# Patient Record
Sex: Female | Born: 1935 | Race: White | Hispanic: No | Smoking: Never smoker
Health system: Southern US, Community
[De-identification: ages and names within clinical notes are randomized; demographics above are authoritative.]

## PROBLEM LIST (undated history)

## (undated) DIAGNOSIS — K219 Gastro-esophageal reflux disease without esophagitis: Secondary | ICD-10-CM

## (undated) DIAGNOSIS — E119 Type 2 diabetes mellitus without complications: Secondary | ICD-10-CM

## (undated) DIAGNOSIS — I2510 Atherosclerotic heart disease of native coronary artery without angina pectoris: Secondary | ICD-10-CM

## (undated) DIAGNOSIS — C50912 Malignant neoplasm of unspecified site of left female breast: Secondary | ICD-10-CM

## (undated) DIAGNOSIS — I480 Paroxysmal atrial fibrillation: Secondary | ICD-10-CM

## (undated) DIAGNOSIS — E039 Hypothyroidism, unspecified: Secondary | ICD-10-CM

## (undated) DIAGNOSIS — I10 Essential (primary) hypertension: Secondary | ICD-10-CM

## (undated) DIAGNOSIS — E785 Hyperlipidemia, unspecified: Secondary | ICD-10-CM

## (undated) HISTORY — PX: STENT PLACEMENT VASCULAR (ARMC HX): HXRAD1737

## (undated) HISTORY — PX: CHOLECYSTECTOMY: SHX55

## (undated) HISTORY — PX: MASTECTOMY: SHX3

## (undated) HISTORY — PX: BREAST LUMPECTOMY: SHX2

## (undated) HISTORY — PX: CARDIAC CATHETERIZATION: SHX172

---

## 2007-08-24 ENCOUNTER — Ambulatory Visit: Payer: Self-pay | Admitting: Endocrinology

## 2007-09-10 ENCOUNTER — Other Ambulatory Visit: Payer: Self-pay

## 2007-09-10 ENCOUNTER — Inpatient Hospital Stay: Payer: Self-pay | Admitting: Cardiovascular Disease

## 2007-09-11 ENCOUNTER — Other Ambulatory Visit: Payer: Self-pay

## 2007-09-13 ENCOUNTER — Ambulatory Visit: Payer: Self-pay | Admitting: Endocrinology

## 2007-09-17 ENCOUNTER — Inpatient Hospital Stay: Payer: Self-pay | Admitting: Internal Medicine

## 2007-09-17 ENCOUNTER — Other Ambulatory Visit: Payer: Self-pay

## 2008-02-14 ENCOUNTER — Emergency Department: Payer: Self-pay | Admitting: Emergency Medicine

## 2008-04-26 ENCOUNTER — Ambulatory Visit: Payer: Self-pay | Admitting: Internal Medicine

## 2008-05-16 ENCOUNTER — Inpatient Hospital Stay: Payer: Self-pay | Admitting: Internal Medicine

## 2008-05-16 ENCOUNTER — Other Ambulatory Visit: Payer: Self-pay

## 2008-05-18 ENCOUNTER — Other Ambulatory Visit: Payer: Self-pay

## 2008-07-12 ENCOUNTER — Ambulatory Visit: Payer: Self-pay

## 2008-09-29 ENCOUNTER — Emergency Department: Payer: Self-pay | Admitting: Emergency Medicine

## 2008-10-23 ENCOUNTER — Emergency Department: Payer: Self-pay | Admitting: Unknown Physician Specialty

## 2008-10-24 ENCOUNTER — Emergency Department: Payer: Self-pay | Admitting: Emergency Medicine

## 2009-03-30 ENCOUNTER — Inpatient Hospital Stay: Payer: Self-pay | Admitting: Internal Medicine

## 2009-07-12 ENCOUNTER — Ambulatory Visit: Payer: Self-pay | Admitting: Family Medicine

## 2009-08-12 ENCOUNTER — Ambulatory Visit: Payer: Self-pay | Admitting: Internal Medicine

## 2009-09-07 ENCOUNTER — Ambulatory Visit: Payer: Self-pay | Admitting: Internal Medicine

## 2009-09-12 ENCOUNTER — Ambulatory Visit: Payer: Self-pay | Admitting: Internal Medicine

## 2009-09-13 ENCOUNTER — Ambulatory Visit: Payer: Self-pay | Admitting: Internal Medicine

## 2009-10-10 ENCOUNTER — Ambulatory Visit: Payer: Self-pay | Admitting: Internal Medicine

## 2009-10-30 ENCOUNTER — Ambulatory Visit: Payer: Self-pay | Admitting: Surgery

## 2009-11-06 ENCOUNTER — Ambulatory Visit: Payer: Self-pay | Admitting: Surgery

## 2009-11-10 ENCOUNTER — Ambulatory Visit: Payer: Self-pay | Admitting: Internal Medicine

## 2009-11-23 ENCOUNTER — Ambulatory Visit: Payer: Self-pay | Admitting: Internal Medicine

## 2009-12-10 ENCOUNTER — Ambulatory Visit: Payer: Self-pay | Admitting: Internal Medicine

## 2009-12-10 ENCOUNTER — Observation Stay: Payer: Self-pay | Admitting: Internal Medicine

## 2010-05-21 IMAGING — NM NM LUNG SCAN
2 series · 16 of 16 positions shown · non-contrast
Comparison: none

REASON FOR EXAM: exertional SOB
COMMENTS:   LMP: Post-Menopausal

[Series 1000: lung perfusion · 1.95mm/px · 4 acquisitions, 8 frames shown]
[im 1/4]
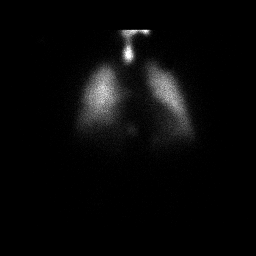
[im 1/4]
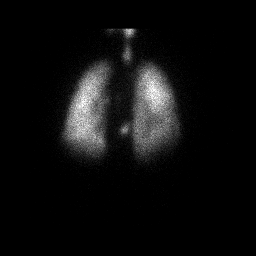
[im 2/4]
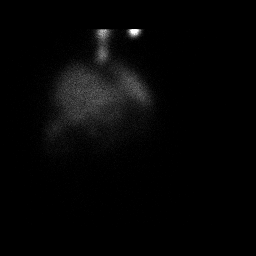
[im 2/4]
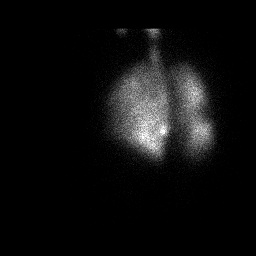
[im 3/4]
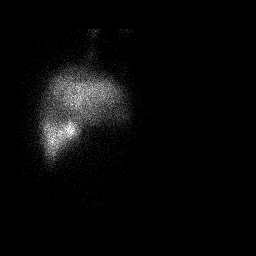
[im 3/4]
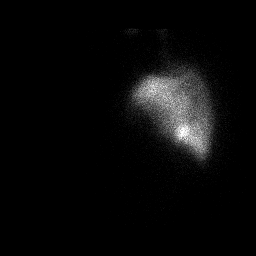
[im 4/4]
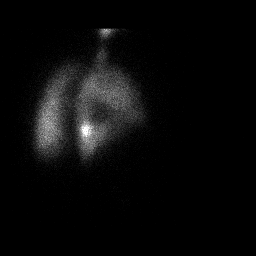
[im 4/4]
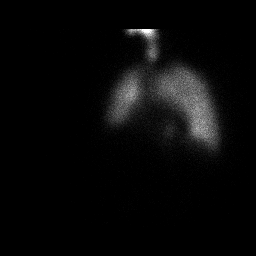

[Series 1000: lung ventilation · 3.90mm/px · 4 acquisitions, 8 frames shown]
[im 1/4]
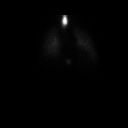
[im 1/4]
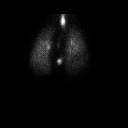
[im 2/4]
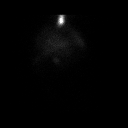
[im 2/4]
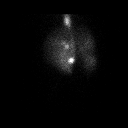
[im 3/4]
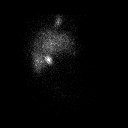
[im 3/4]
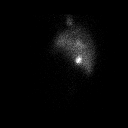
[im 4/4]
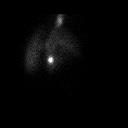
[im 4/4]
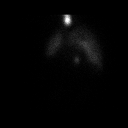

[16 of 16 positions shown; findings below may reference images not displayed]

PROCEDURE:     NM  - NM VQ LUNG SCAN  - [DATE]  [DATE] [DATE]  [DATE]

RESULT:     Ventilation/perfusion scan was performed with 28.20 mCi of
technetium 99m DTPA and 3.89 mCi 2c-ZZm MAA. Matched defect is noted along
the posterior aspect of the right lower lobe. Chest x-ray is negative. There
is an indeterminate scan for pulmonary embolus.
IMPRESSION: Indeterminate scan for pulmonary embolus.

This report was phoned to the physician at [DATE] a.m. on 12/11/2009.

## 2010-05-21 IMAGING — CR DG CHEST 2V
1 series · 2 of 2 positions shown · non-contrast
Comparison: none

REASON FOR EXAM: cough, shortness of breath, recent surgery 3 weeks ago
COMMENTS:   LMP: Post-Menopausal

PROCEDURE:     DXR - DXR CHEST PA (OR AP) AND LATERAL  - December 10, 2009 [DATE]
RESULT:     No acute cardiopulmonary disease. Cardiac pacer noted. Heart
size is normal. Chest is stable from 10/30/2009.

[Series 1: view not recorded · 0.17mm/px · 2 of 2 slices shown]
[im 1/2]
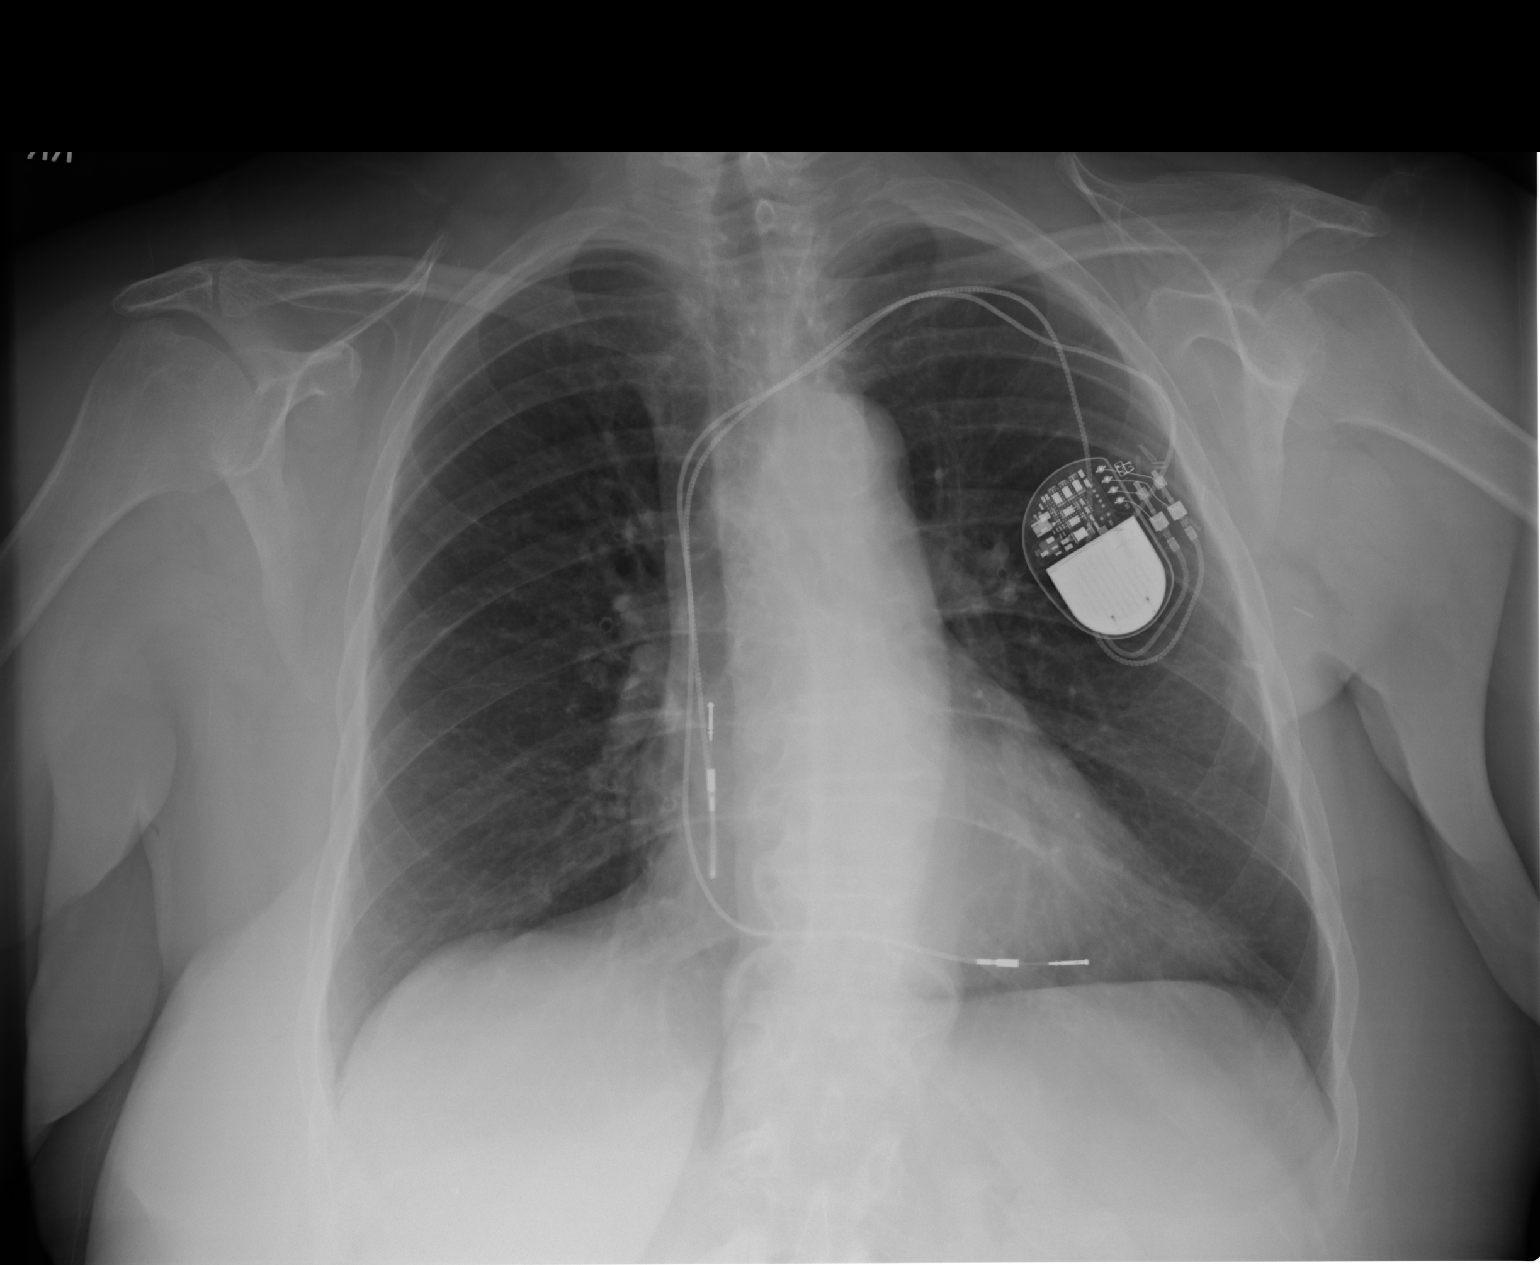
[im 2/2]
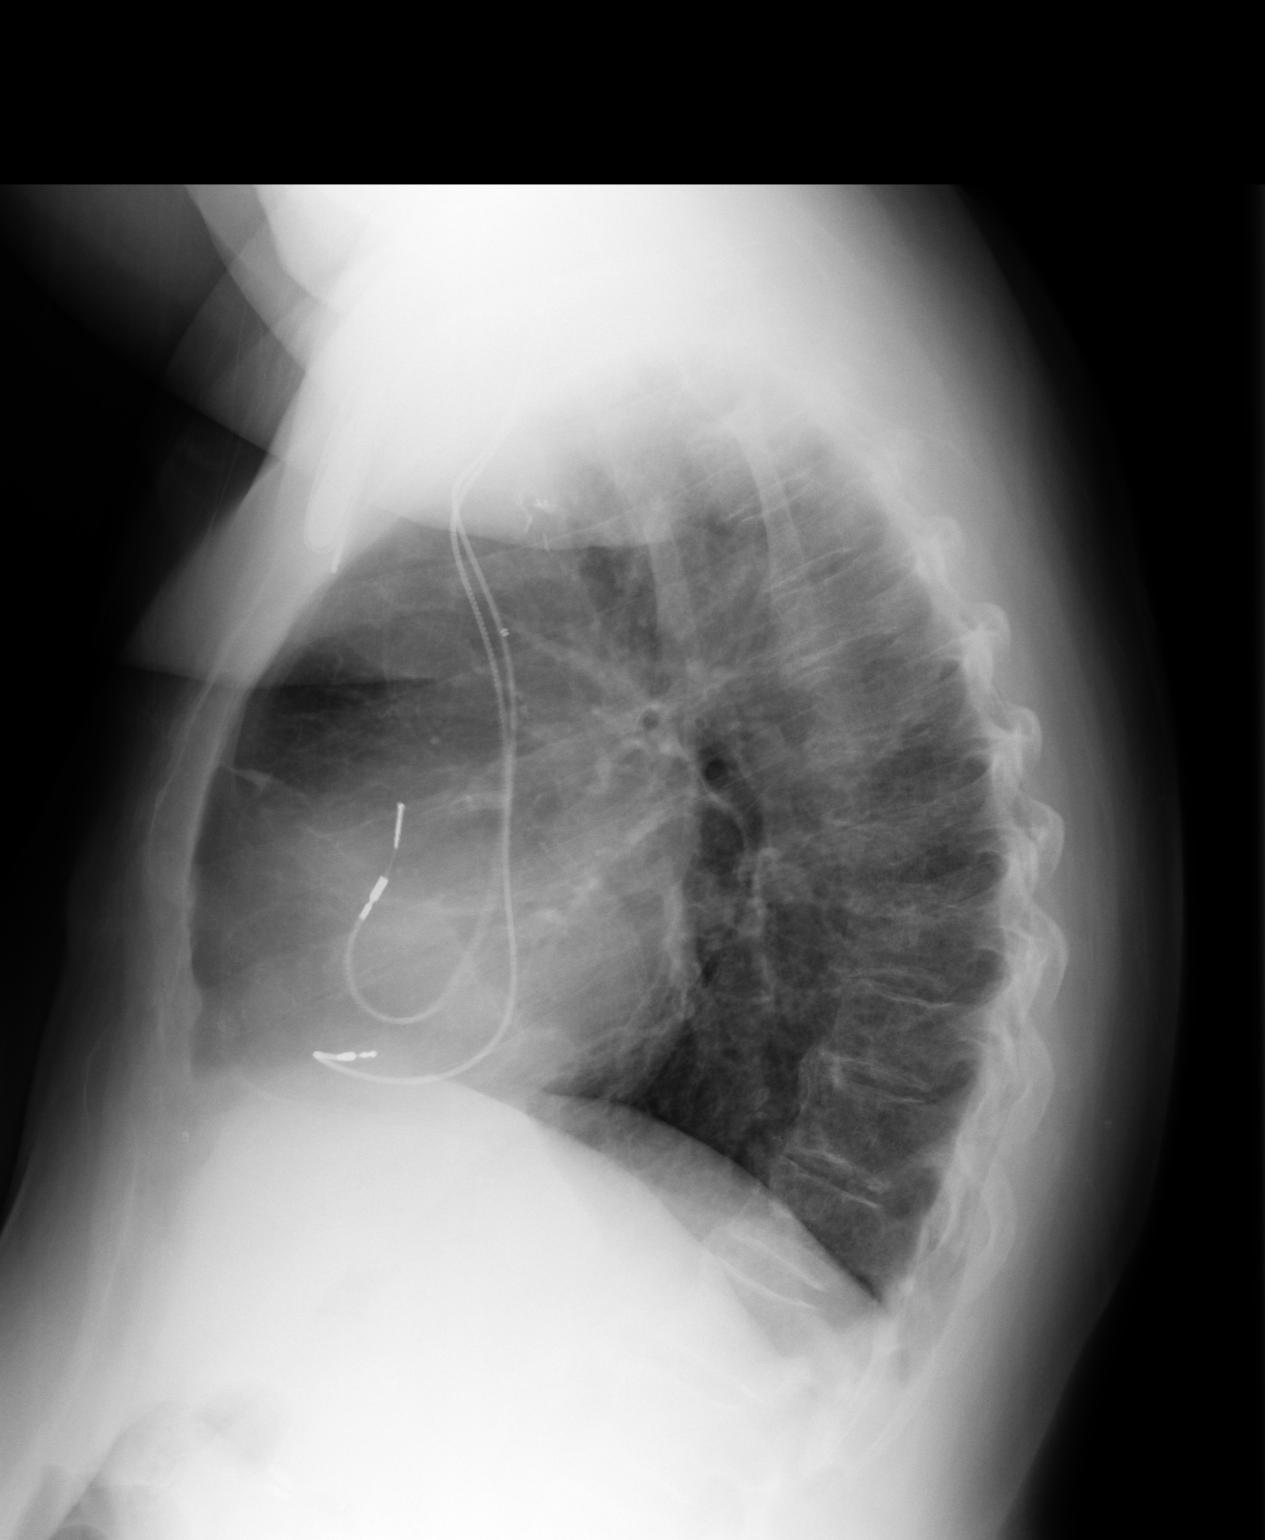

[2 of 2 positions shown; findings below may reference images not displayed]

IMPRESSION: No acute abnormality.

## 2011-02-01 ENCOUNTER — Emergency Department: Payer: Self-pay | Admitting: Emergency Medicine

## 2011-03-13 ENCOUNTER — Ambulatory Visit: Payer: Self-pay | Admitting: Cardiovascular Disease

## 2011-06-19 ENCOUNTER — Ambulatory Visit: Payer: Self-pay | Admitting: Gastroenterology

## 2011-06-23 LAB — PATHOLOGY REPORT

## 2011-08-13 DIAGNOSIS — C50912 Malignant neoplasm of unspecified site of left female breast: Secondary | ICD-10-CM

## 2011-08-13 HISTORY — DX: Malignant neoplasm of unspecified site of left female breast: C50.912

## 2011-08-21 ENCOUNTER — Ambulatory Visit: Payer: Self-pay | Admitting: Gastroenterology

## 2011-08-23 LAB — PATHOLOGY REPORT

## 2011-09-24 ENCOUNTER — Ambulatory Visit: Payer: Self-pay | Admitting: Family Medicine

## 2012-07-03 LAB — URINALYSIS, COMPLETE
Bilirubin,UR: NEGATIVE
Ketone: NEGATIVE
Ph: 5 (ref 4.5–8.0)
Protein: NEGATIVE
Specific Gravity: 1.008 (ref 1.003–1.030)
Squamous Epithelial: 7

## 2012-07-03 LAB — CBC
HCT: 32.1 % — ABNORMAL LOW (ref 35.0–47.0)
HGB: 10.7 g/dL — ABNORMAL LOW (ref 12.0–16.0)
MCH: 28.8 pg (ref 26.0–34.0)
MCV: 86 fL (ref 80–100)
Platelet: 229 10*3/uL (ref 150–440)
RBC: 3.72 10*6/uL — ABNORMAL LOW (ref 3.80–5.20)
RDW: 14.3 % (ref 11.5–14.5)
WBC: 6.8 10*3/uL (ref 3.6–11.0)

## 2012-07-03 LAB — BASIC METABOLIC PANEL
Anion Gap: 6 — ABNORMAL LOW (ref 7–16)
Calcium, Total: 8.8 mg/dL (ref 8.5–10.1)
Chloride: 107 mmol/L (ref 98–107)
Co2: 28 mmol/L (ref 21–32)
EGFR (Non-African Amer.): 43 — ABNORMAL LOW
Glucose: 167 mg/dL — ABNORMAL HIGH (ref 65–99)
Osmolality: 287 (ref 275–301)
Potassium: 3.7 mmol/L (ref 3.5–5.1)
Sodium: 141 mmol/L (ref 136–145)

## 2012-07-04 ENCOUNTER — Observation Stay: Payer: Self-pay | Admitting: Student

## 2012-07-05 LAB — BASIC METABOLIC PANEL
Anion Gap: 5 — ABNORMAL LOW (ref 7–16)
BUN: 18 mg/dL (ref 7–18)
Co2: 26 mmol/L (ref 21–32)
Creatinine: 1.17 mg/dL (ref 0.60–1.30)
EGFR (Non-African Amer.): 45 — ABNORMAL LOW
Glucose: 253 mg/dL — ABNORMAL HIGH (ref 65–99)
Potassium: 4.2 mmol/L (ref 3.5–5.1)
Sodium: 137 mmol/L (ref 136–145)

## 2012-07-05 LAB — LIPID PANEL
Cholesterol: 105 mg/dL (ref 0–200)
HDL Cholesterol: 56 mg/dL (ref 40–60)
Ldl Cholesterol, Calc: 10 mg/dL (ref 0–100)
VLDL Cholesterol, Calc: 39 mg/dL (ref 5–40)

## 2012-07-05 LAB — CBC WITH DIFFERENTIAL/PLATELET
Eosinophil #: 0.1 10*3/uL (ref 0.0–0.7)
Eosinophil %: 1.9 %
HCT: 30.1 % — ABNORMAL LOW (ref 35.0–47.0)
HGB: 10.1 g/dL — ABNORMAL LOW (ref 12.0–16.0)
Lymphocyte %: 19.6 %
MCH: 29.1 pg (ref 26.0–34.0)
MCHC: 33.6 g/dL (ref 32.0–36.0)
MCV: 87 fL (ref 80–100)
Monocyte #: 0.6 x10 3/mm (ref 0.2–0.9)
Monocyte %: 7.8 %
Neutrophil #: 5.3 10*3/uL (ref 1.4–6.5)
Platelet: 213 10*3/uL (ref 150–440)
RBC: 3.48 10*6/uL — ABNORMAL LOW (ref 3.80–5.20)
RDW: 13.9 % (ref 11.5–14.5)
WBC: 7.7 10*3/uL (ref 3.6–11.0)

## 2012-07-05 LAB — URINE CULTURE

## 2012-07-05 LAB — HEMOGLOBIN A1C: Hemoglobin A1C: 7.4 % — ABNORMAL HIGH (ref 4.2–6.3)

## 2012-09-09 ENCOUNTER — Emergency Department: Payer: Self-pay | Admitting: Emergency Medicine

## 2012-10-14 ENCOUNTER — Ambulatory Visit: Payer: Self-pay | Admitting: Family Medicine

## 2013-04-15 ENCOUNTER — Other Ambulatory Visit: Payer: Self-pay | Admitting: Urgent Care

## 2013-04-15 LAB — CLOSTRIDIUM DIFFICILE BY PCR

## 2013-05-04 ENCOUNTER — Ambulatory Visit: Payer: Self-pay | Admitting: Gastroenterology

## 2013-11-04 ENCOUNTER — Ambulatory Visit: Payer: Self-pay | Admitting: Family Medicine

## 2014-02-03 ENCOUNTER — Other Ambulatory Visit: Payer: Self-pay | Admitting: Urgent Care

## 2014-02-03 LAB — HEPATIC FUNCTION PANEL A (ARMC)
Albumin: 3.3 g/dL — ABNORMAL LOW (ref 3.4–5.0)
Alkaline Phosphatase: 80 U/L
BILIRUBIN TOTAL: 0.4 mg/dL (ref 0.2–1.0)
Bilirubin, Direct: <0.1 mg/dL (ref 0.00–0.20)
SGOT(AST): 25 U/L (ref 15–37)
SGPT (ALT): 21 U/L (ref 12–78)
Total Protein: 6.7 g/dL (ref 6.4–8.2)

## 2014-02-03 LAB — BASIC METABOLIC PANEL
Anion Gap: 8 (ref 7–16)
BUN: 18 mg/dL (ref 7–18)
CREATININE: 1.35 mg/dL — AB (ref 0.60–1.30)
Calcium, Total: 9.1 mg/dL (ref 8.5–10.1)
Chloride: 104 mmol/L (ref 98–107)
Co2: 28 mmol/L (ref 21–32)
EGFR (Non-African Amer.): 38 — ABNORMAL LOW
GFR CALC AF AMER: 43 — AB
Glucose: 84 mg/dL (ref 65–99)
Osmolality: 280 (ref 275–301)
Potassium: 3.4 mmol/L — ABNORMAL LOW (ref 3.5–5.1)
Sodium: 140 mmol/L (ref 136–145)

## 2014-02-03 LAB — HEMOGLOBIN A1C: Hemoglobin A1C: 7.5 % — ABNORMAL HIGH (ref 4.2–6.3)

## 2014-02-03 LAB — TSH: THYROID STIMULATING HORM: 4.64 u[IU]/mL — AB

## 2014-02-07 ENCOUNTER — Other Ambulatory Visit: Payer: Self-pay | Admitting: Urgent Care

## 2014-02-07 LAB — CLOSTRIDIUM DIFFICILE(ARMC)

## 2014-10-25 ENCOUNTER — Ambulatory Visit: Payer: Self-pay | Admitting: Internal Medicine

## 2014-11-07 ENCOUNTER — Ambulatory Visit: Payer: Self-pay | Admitting: Family Medicine

## 2014-11-29 NOTE — Discharge Summary (Signed)
PATIENT NAME:  Regina [name], Regina [name] MR#:  [id number] DATE OF BIRTH:  Dec 29, 1933  DATE OF ADMISSION:  07/04/2012 DATE OF DISCHARGE:  07/06/2012   CONSULTANTS:  1. Dr. Mack Guise from Orthopedics  2. PT   PRIMARY CARE PHYSICIAN: Dr. Iona Beard   CHIEF COMPLAINT: Back and hip pain.   DISCHARGE DIAGNOSES:  1. Low back pain and hip pain possibly from arthritis.  2. Hypertensive urgency/malignant hypertension, now improved.  3. Diabetes.  4. Hypertension.  5. Obesity.  6. Coronary artery disease, status post cath and stent placement.  7. Dyslipidemia.  8. Paroxysmal atrial fibrillation, on aspirin.  9. Gastroesophageal reflux disease.  10. Chronic renal insufficiency with baseline creatinine of 1.1 to 1.4.  11. History of breast cancer.  12. History of pacemaker.  13. Hypothyroidism.   DISCHARGE MEDICATIONS:  1. Crestor 10 mg daily.  2. Levoxyl 50 mcg daily.  3. Metoprolol succinate 25 mg 2 times a Alexopoulos.  4. Lisinopril 20 mg daily. 5. Bumetanide 1 mg daily.  6. Aspirin 81 mg enteric-coated daily 1 tab. 7. Humulin 70/30 52 units once a Lynds in the morning.  8. Metformin 500 mg once a Cobarrubias. 9. Plavix 75 mg daily.  10. Protonix 40 mg 2 times a [place].  11. Tylenol 500 mg Extra Strength 1 tab every six hours as needed for pain.  12. Zemplar 1 mcg daily on Monday, Wednesday, Friday. 13. Percocet 325/5 mg 1 tab every six hours as needed for pain.   DIET: Low sodium, low fat, low cholesterol, ADA diet.   ACTIVITY: As tolerated.   DISCHARGE FOLLOW-UP:  1. Please follow with your primary care physician within 1 to 2 weeks.  2. Please follow with orthopedic doctor, Dr. Mack Guise, if worsening or recurrent back and hip pain.   DISPOSITION: Home.    HISTORY OF PRESENT ILLNESS/HOSPITAL COURSE: For full details of history and physical, please see the dictation on 07/04/2012 by Dr. Margaretmary Eddy. Briefly, this is a morbidly obese 81 year old Caucasian female with coronary artery disease status post stenting,  insulin-dependent diabetes mellitus, hypertension, paroxysmal atrial fibrillation not on Coumadin, and gastroesophageal reflux disease who presented with above chief complaint. The patient started several days prior to admission. She had some difficulty ambulating and, therefore, EMS was called and the patient came here where a CT of the right hip was done which was limited quality but nondisplaced or incomplete fracture in the right femoral neck was of a concern. An MRI was recommended but the patient given pacemaker could not have an MRI. The patient was admitted to the hospitalist service for pain management, PT consult and orthopedic consult. The patient was seen by Dr. Mack Guise. A bone scan was ordered which came back negative for fractures. She was seen by physical therapy and at this point her symptoms are "100% better". She will be discharged back with Percocet p.r.n. and walker which she already has. The patient was seen by PT. The patient did have elevated blood pressure on arrival which potentially could have been from pain.   DISPOSITION: Home.   CODE STATUS: FULL CODE.     TOTAL TIME SPENT: 35 minutes.   ____________________________ Vivien Presto, MD sa:drc D: 07/06/2012 11:09:42 ET T: 07/06/2012 13:32:01 ET JOB#: [id number]  cc: Vivien Presto, MD, <Dictator> Salome Holmes, MD Vivien Presto MD ELECTRONICALLY SIGNED 07/23/2012 11:09

## 2014-11-29 NOTE — Consult Note (Signed)
Brief Consult Note: Diagnosis: Lumbar disc herniation with possible occult hip fracture.   Patient was seen by consultant.   Consult note dictated.   Recommend further assessment or treatment.   Orders entered.   Comments: Patient's exam is consistent with lumbar degenerative disease with a herniated lumbar disc.  She has right sided radicular symptoms with no groin pain.  Patient is unable to have an MRI of the spine or right hip due to a pacemaker.  I would recommend a bone scan of the right hip.  A medrol dose pack would be beneficial to treat a lumbar disc herniation but will likely elevate the patient's blood sugars.  Will discuss with hospitalist before ordering.  PT evaluation will be ordered after the bone scan, if it is negative.  Electronic Signatures: Thornton Park (MD)  (Signed [phone] 11:51)  Authored: Brief Consult Note   Last Updated: 23-Nov-13 11:51 by Thornton Park (MD)

## 2014-11-29 NOTE — H&P (Signed)
PATIENT NAME:  Regina [name], Regina [name] MR#:  [id number] DATE OF BIRTH:  11/10/1933  DATE OF ADMISSION:  07/04/2012  PRIMARY CARE PHYSICIAN: Salome Holmes, MD  CHIEF COMPLAINT: Low back pain and right hip pain.   HISTORY OF PRESENT ILLNESS: The patient is a 81 year old Caucasian female with past medical history of coronary artery disease status post cardiac catheterization and stent placement in the year 2009, insulin-dependent diabetes mellitus, hypertension, hyperlipidemia, morbid obesity, paroxysmal atrial fibrillation not on Coumadin, GERD, chronic renal insufficiency with baseline creatinine at around 1.1 to 1.4, prior history of breast cancer status post lumpectomy and left mastectomy, status post pacemaker, and hypothyroidism presenting to the ER with the chief complaint of low back pain associated with right hip pain radiating to the right foot for the past two to three days. The patient has history of fall three to four months ago, but following that she did not any difficulty in walking and she was doing her daily work without any difficulty. The patient came to know that her granddaughter is coming next week for Thanksgiving, on Tuesday, and then she started doing strenuous work cleaning her house for Thanksgiving and then she started developing low back pain for the past two days. Pain is radiating to the right hip and then down to the right foot. She noted the pain is getting worse and today the patient is unable to get out of the bed. She was unable to walk and she called her daughter. Daughter came in and with the help of a walker she took her to the bathroom and from there to the living room. As the pain is getting worse, they called EMS and the patient was brought into the ER. The ER physician has ordered a CAT scan of the abdomen and pelvis as there is a concern for abdominal aortic aneurysm. Also CT of the right hip was done. CT of the abdomen and pelvis has revealed no mechanical bowel obstruction or  hydronephrosis and no symptoms or signs of acute diverticulitis. It shows diskogenic degenerative disease within the lumbar spine. CAT scan of the right hip was done which has revealed very subtle findings, but in view of clinical history concern for possible very subtle fracture through the right subcapital femoral neck superiorly. They have recommended MRI of bones and scintigraphy for confirmation. Orthopedic doctor, Dr. Thornton Park, was called who has requested to admit the patient to the hospitalist service and get MRI done in the a.m. Hospitalist team is called to admit the patient. During my examination, the patient was upset as she is getting admitted to the hospital and cannot take care of her household stuff for Thanksgiving. The patient was also disappointed and started crying as she cannot stay with her granddaughter for this Thanksgiving. Finally, after having a lengthy discussion, the patient has agreed to get admitted to the hospital. She denies any chest pain or shortness of breath, denies any abdominal pain. The patient denies any dizziness. She denies any other complaints. No headache or blurry vision.   PAST MEDICAL HISTORY:  1. Coronary artery disease status post cardiac catheterization in the year 2009 and status post two stent placements. 2. Insulin-dependent diabetes mellitus. 3. Hypertension. 4. Hyperlipidemia. 5. Morbid obesity. 6. Paroxysmal atrial fibrillation, on aspirin. 7. Gastroesophageal reflux disease. 8. Chronic renal insufficiency with baseline creatinine 1.1 to 1.4. 9. Breast cancer. 10. Status post pacemaker. 11. Hypothyroidism.   PAST SURGICAL HISTORY:  1. Left mastectomy. 2. Lumpectomy.  3. Coronary artery stent placement.  4. Cholecystectomy.   ALLERGIES: No known drug allergies.  HOME MEDICATIONS: 1. Humulin 70/30 52 units subcutaneous a.m. 2. Zemplar 1 mcg p.o. once daily. 3. Tylenol extra-strength every six hours. 4. Protonix 40 mg p.o. once  daily.  5. Plavix 75 mg p.o. once daily.  6. Metoprolol succinate 25 mg p.o. twice a Plotts. 7. Metformin 500 mg once a [place].  8. Lisinopril 20 mg once a [place]igan. 9. Levothyroxine 50 mcg once a [place].  10. Crestor 10 mg p.o. at bedtime. 11. Aspirin, enteric-coated, once daily.  12. Bumetanide 1 tablet p.o. once daily.   PSYCHOSOCIAL HISTORY: Lives alone. Denies smoking, alcohol, or illicit drug usage.   FAMILY HISTORY: Mother died at age 72 with kidney failure. Father died of lung cancer.   REVIEW OF SYSTEMS: CONSTITUTIONAL: The patient denies any weakness. Complaining of low back pain and right hip pain. Denies weight loss or weight gain. EYES: Denies blurry vision, inflammation, floaters, or cataracts. ENT: Denies tinnitus, epistaxis, discharge, snoring, postnasal drip, or difficulty swallowing. RESPIRATORY: Denies cough, wheezing, hemoptysis, dyspnea, asthma, or chronic obstructive pulmonary disease. CARDIOVASCULAR: Denies chest pain, orthopnea, high blood pressure, varicose veins, or palpitations. GI: Denies any nausea, vomiting, diarrhea, abdominal pain, jaundice, rectal bleeding, hemorrhoids, or hernia. GENITOURINARY: Denies dysuria, hematuria, renal calcifications, frequency, or incontinence. ENDOCRINE: Denies polyuria, nocturia, thyroid problems, or increased sweating.  HEMATOLOGY: Denies any anemia or easy bruising. INTEGUMENTARY: Denies any acne, rash, or lesions. MUSCULOSKELETAL: Complaining of low back pain, right hip pain, and right foot pain. Denies any gout.  NEURO: Denies numbness, weakness, ataxia, or seizures. No memory loss. PSYCH: Denies OCD, bipolar disorder, or depression.   PHYSICAL EXAMINATION:   VITAL SIGNS: Temperature 96.3, pulse 79, respiratory rate 22, and blood pressure 196/82.   GENERAL APPEARANCE: Not in acute distress, but upset as the patient needs to be admitted to the hospital. She was in tears. Not in acute distress.   HEENT: Normocephalic, atraumatic. Pupils are  equally reactive and light and accommodation. No postnasal drip. Moist mucous membranes.   NECK: Supple. No JVD. No thyromegaly. No carotid bruits.   PULMONARY: Lungs clear to auscultation bilaterally.   CARDIAC: S1 and S2 normal. Regular rate and rhythm. Point of maximal index impulse is intact.  GASTROINTESTINAL: Soft. Obese. Bowel sounds are positive in all four quadrants. Nontender. Nondistended. No masses felt.   NEUROLOGIC: Awake, alert, and oriented x3.   BACK: Lower back is diffusely tender. Right hip is also tender.  EXTREMITIES: Reflexes are 2+. No pedal edema. No cyanosis. No clubbing.   SKIN: No lesions. No rashes. No cyanosis.   RESULTS: CT of the right hip shows very subtle findings. In view of clinical history, concern for possible very subtle fracture through the right subcapital fracture through the right subcapital femoral neck superiorly. Consider MRI of bones and scintigraphy.  CT of the abdomen and pelvis has revealed no acute findings, no diverticulitis, no signs of mechanical bowel obstruction, but positive evidence of diverticulosis.  Blood sugar 167, BUN 19, creatinine 1.22, sodium 141, potassium 3.7, chloride 107, CO2 28, anion gap 6, BUN 19. GFR 43. Serum calcium 8.8. WBC 6.8, hemoglobin 10.7, hematocrit 32.1, and platelet count 229,000.   Urinalysis: Leukocyte esterase but nitrite is negative. Protein negative. Blood 1+, pH 5.0, specific gravity 1.008, and bacteria 1+. Mucous present.   ASSESSMENT AND PLAN: A 81 year old female presenting to the ER with chief complaint of acute onset low back pain associated with right hip pain and right foot pain. She  will be admitted with the following assessment and plan.   1. Right hip pain, possible right subcapital femoral neck fracture. Admit to med/surg unit. We will get MRI of the hip in the a.m. Ortho consult is placed to Dr. Thornton Park. We will make her NPO for possible hip fracture and repair. The patient will  be on half-normal saline with 20 KCL. GI prophylaxis with Protonix.  2. Hypertensive urgency, probably from pain and anxiety and being upset. We will give her morphine IV as needed on basis for better pain control and give her Lopressor on as needed basis for control of blood pressure.  3. Positive urinary tract infection, per urinalysis. We will start her on IV Rocephin.  4. Insulin-dependent diabetes mellitus. As the patient is n.p.o., we will hold her insulin and will be on insulin sliding scale.  5. As per my discussion with the on-call radiologist, there is no diverticulitis.  6. I will hold all other medications.  7. Coronary artery disease, status post cardiac catheterization and status post two stent placements with normal ejection fraction in year 2009. The patient is asymptomatic right now, no interventions needed. We will hold off on aspirin and Plavix with an anticipation that she might have to get hip surgery tomorrow. If the MRI is negative, please resume her home medications including aspirin and Coreg. 8. CODE STATUS: The patient is FULL CODE. 9. We will provide her GI prophylaxis with Protonix and deep vein thrombosis prophylaxis with heparin sub-Q.   The diagnosis and plan of care was discussed in detail with the patient and her daughter at bedside. They both voiced understanding of the plan.  TOTAL TIME SPENT ON ADMISSION: 55 minutes.  ____________________________ Nicholes Mango, MD ag:slb D: 07/04/2012 01:49:08 ET T: 07/04/2012 08:12:15 ET JOB#: [id number]  cc: Nicholes Mango, MD, <Dictator> Salome Holmes, MD Nicholes Mango MD ELECTRONICALLY SIGNED 07/08/2012 6:27

## 2014-11-29 NOTE — Consult Note (Signed)
PATIENT NAME:  Regina [name], Regina [name] MR#:  [id number] DATE OF BIRTH:  30-Nov-1933  DATE OF CONSULTATION:  07/04/2012  REFERRING PHYSICIAN:   CONSULTING PHYSICIAN:  Timoteo Gaul, MD  REASON FOR CONSULTATION: Low back and right hip pain.   HISTORY OF PRESENT ILLNESS: The patient is a 81 year old female with multiple medical comorbidities including coronary artery disease, insulin-dependent diabetes, hypertension, atrial fibrillation, hyperlipidemia, and morbid obesity with chronic renal insufficiency. The patient states that her symptoms began at approximately 2:00 a.m. on Wednesday morning, 07/01/2012. She went to use the restroom at night and was using her walker. As she walked to the bathroom, she had the onset of low back pain extending into her buttock and down the posterior aspect of her leg into the foot. She was able to get back into bed but the following morning had difficulty getting out of the bed. She has had persistent symptoms over the past several days and finally presented to the [facility] Room yesterday. The patient had been increasing her activity prior in preparation for Thanksgiving. She had been performing housework. The [place] of admission, the patient had difficulty getting out of bed or walking and was brought to the [facility] Emergency Room by her daughter.   The patient's past surgical history, family and social history, as well as medications and allergies were reviewed today from the patient's admission history and physical.  PHYSICAL EXAMINATION: The patient was seen sitting up in bed. Her granddaughter was at the bedside. The patient is alert and oriented and in no acute distress while lying in bed. She states that the pain radiates across her back and into the posterior buttock area and radiates down her lower leg and into the foot. She denies Regina [name] anterior groin pain. The patient has not experienced bowel or bladder  dysfunction or Regina [name] significant numbness or weakness.   On exam today, the patient has no significant discomfort with logrolling of the right hip. With active flexion of the knee and hip to approximately 60 degrees, the patient begins having low back and buttock pain. With active hip flexion to 60 degrees with internal/external rotation, the patient did not have Regina [name] significant groin pain, but had pain in the low back and buttock area. She had no tenderness over the posterior spinous processes but did have tenderness to direct palpation in the right buttock and paraspinal muscles. The left side had minimal tenderness. She had minimal discomfort with logrolling and internal/external rotation of the hip on the left side. The pain she did have again was located in the low back. There was no evidence of trauma to the low back. She had no erythema, ecchymosis, or swelling. She has palpable pedal pulses and intact sensation throughout the bilateral lower extremities. She has intact motor function in the bilateral lower legs with 5/5 strength to manual testing.   RADIOLOGY: I reviewed the patient's pelvis and hip plain films as well as the CT of the right hip. I do not see definitive evidence for a hip fracture. The patient has mild degenerative changes but no other osseous abnormality. I have reviewed the radiology report from the abdominal CT.   ASSESSMENT AND PLAN:  Right hip and lower extremity pain, likely due to degenerative changes seen on the patient's lumbar AP and lateral plain films. The patient is unable to have an MRI due to a pacemaker and therefore confirmation of a herniated disk of the lumbar spine or occult right hip  is not possible. I am going to speak to the radiologist about ordering a bone scan to evaluate for occult right hip fracture. I also feel that Medrol dose pack may be helpful for her if she has a herniated disk. I will discuss this with the hospitalist. The patient will be on bedrest  with bathroom privileges until the bone scan can be performed. If the bone scan is negative, then the patient will be started on physical therapy. If her radicular symptoms continue to be a problem for her, I would recommend pain management evaluation for possible corticosteroid injection, if the steroids do not provide her with relief. For now, the patient should be treated symptomatically for pain.  ____________________________ Timoteo Gaul, MD klk:slb D: 07/04/2012 11:46:40 ET T: 07/04/2012 12:50:13 ET JOB#: [id number]  cc: Timoteo Gaul, MD, <Dictator> Timoteo Gaul MD ELECTRONICALLY SIGNED 07/05/2012 18:58

## 2015-02-19 ENCOUNTER — Observation Stay
Admission: EM | Admit: 2015-02-19 | Discharge: 2015-02-21 | Disposition: A | Discharge status: Home / Self Care | Payer: Medicare Other | Attending: Internal Medicine | Admitting: Internal Medicine

## 2015-02-19 ENCOUNTER — Encounter: Payer: Self-pay | Admitting: Emergency Medicine

## 2015-02-19 ENCOUNTER — Emergency Department: Payer: Medicare Other

## 2015-02-19 DIAGNOSIS — Z7901 Long term (current) use of anticoagulants: Secondary | ICD-10-CM | POA: Diagnosis not present

## 2015-02-19 DIAGNOSIS — K219 Gastro-esophageal reflux disease without esophagitis: Secondary | ICD-10-CM | POA: Diagnosis present

## 2015-02-19 DIAGNOSIS — E11649 Type 2 diabetes mellitus with hypoglycemia without coma: Secondary | ICD-10-CM | POA: Diagnosis not present

## 2015-02-19 DIAGNOSIS — Z7902 Long term (current) use of antithrombotics/antiplatelets: Secondary | ICD-10-CM | POA: Diagnosis not present

## 2015-02-19 DIAGNOSIS — N390 Urinary tract infection, site not specified: Secondary | ICD-10-CM | POA: Diagnosis present

## 2015-02-19 DIAGNOSIS — Z79899 Other long term (current) drug therapy: Secondary | ICD-10-CM | POA: Diagnosis not present

## 2015-02-19 DIAGNOSIS — E162 Hypoglycemia, unspecified: Secondary | ICD-10-CM | POA: Diagnosis present

## 2015-02-19 DIAGNOSIS — E785 Hyperlipidemia, unspecified: Secondary | ICD-10-CM | POA: Diagnosis not present

## 2015-02-19 DIAGNOSIS — I480 Paroxysmal atrial fibrillation: Secondary | ICD-10-CM | POA: Diagnosis present

## 2015-02-19 DIAGNOSIS — R748 Abnormal levels of other serum enzymes: Secondary | ICD-10-CM | POA: Insufficient documentation

## 2015-02-19 DIAGNOSIS — N3000 Acute cystitis without hematuria: Secondary | ICD-10-CM | POA: Insufficient documentation

## 2015-02-19 DIAGNOSIS — Z794 Long term (current) use of insulin: Secondary | ICD-10-CM | POA: Insufficient documentation

## 2015-02-19 DIAGNOSIS — Z950 Presence of cardiac pacemaker: Secondary | ICD-10-CM | POA: Insufficient documentation

## 2015-02-19 DIAGNOSIS — E039 Hypothyroidism, unspecified: Secondary | ICD-10-CM | POA: Insufficient documentation

## 2015-02-19 DIAGNOSIS — E119 Type 2 diabetes mellitus without complications: Secondary | ICD-10-CM

## 2015-02-19 DIAGNOSIS — Z955 Presence of coronary angioplasty implant and graft: Secondary | ICD-10-CM | POA: Insufficient documentation

## 2015-02-19 DIAGNOSIS — I10 Essential (primary) hypertension: Secondary | ICD-10-CM | POA: Diagnosis not present

## 2015-02-19 DIAGNOSIS — E1159 Type 2 diabetes mellitus with other circulatory complications: Secondary | ICD-10-CM | POA: Diagnosis present

## 2015-02-19 DIAGNOSIS — I2510 Atherosclerotic heart disease of native coronary artery without angina pectoris: Secondary | ICD-10-CM | POA: Insufficient documentation

## 2015-02-19 DIAGNOSIS — Z6841 Body Mass Index (BMI) 40.0 and over, adult: Secondary | ICD-10-CM | POA: Insufficient documentation

## 2015-02-19 DIAGNOSIS — Z853 Personal history of malignant neoplasm of breast: Secondary | ICD-10-CM | POA: Diagnosis not present

## 2015-02-19 DIAGNOSIS — R531 Weakness: Secondary | ICD-10-CM

## 2015-02-19 DIAGNOSIS — R7989 Other specified abnormal findings of blood chemistry: Secondary | ICD-10-CM | POA: Diagnosis present

## 2015-02-19 DIAGNOSIS — R778 Other specified abnormalities of plasma proteins: Secondary | ICD-10-CM

## 2015-02-19 DIAGNOSIS — I152 Hypertension secondary to endocrine disorders: Secondary | ICD-10-CM | POA: Diagnosis present

## 2015-02-19 HISTORY — DX: Hypothyroidism, unspecified: E03.9

## 2015-02-19 HISTORY — DX: Type 2 diabetes mellitus without complications: E11.9

## 2015-02-19 HISTORY — DX: Hyperlipidemia, unspecified: E78.5

## 2015-02-19 HISTORY — DX: Gastro-esophageal reflux disease without esophagitis: K21.9

## 2015-02-19 HISTORY — DX: Atherosclerotic heart disease of native coronary artery without angina pectoris: I25.10

## 2015-02-19 HISTORY — DX: Essential (primary) hypertension: I10

## 2015-02-19 HISTORY — DX: Paroxysmal atrial fibrillation: I48.0

## 2015-02-19 LAB — COMPREHENSIVE METABOLIC PANEL
ALK PHOS: 68 U/L (ref 38–126)
ALT: 12 U/L — ABNORMAL LOW (ref 14–54)
ANION GAP: 11 (ref 5–15)
AST: 28 U/L (ref 15–41)
Albumin: 3.5 g/dL (ref 3.5–5.0)
BUN: 17 mg/dL (ref 6–20)
CO2: 24 mmol/L (ref 22–32)
CREATININE: 1.65 mg/dL — AB (ref 0.44–1.00)
Calcium: 8.7 mg/dL — ABNORMAL LOW (ref 8.9–10.3)
Chloride: 104 mmol/L (ref 101–111)
GFR calc Af Amer: 33 mL/min — ABNORMAL LOW (ref >60)
GFR calc non Af Amer: 28 mL/min — ABNORMAL LOW (ref >60)
GLUCOSE: 153 mg/dL — AB (ref 65–99)
Potassium: 3.3 mmol/L — ABNORMAL LOW (ref 3.5–5.1)
Sodium: 139 mmol/L (ref 135–145)
TOTAL PROTEIN: 6.7 g/dL (ref 6.5–8.1)
Total Bilirubin: 0.4 mg/dL (ref 0.3–1.2)

## 2015-02-19 LAB — URINALYSIS COMPLETE WITH MICROSCOPIC (ARMC ONLY)
Bilirubin Urine: NEGATIVE
GLUCOSE, UA: 50 mg/dL — AB
KETONES UR: NEGATIVE mg/dL
Nitrite: NEGATIVE
PH: 5 (ref 5.0–8.0)
Protein, ur: NEGATIVE mg/dL
Specific Gravity, Urine: 1.01 (ref 1.005–1.030)

## 2015-02-19 LAB — CBC
HCT: 37.9 % (ref 35.0–47.0)
Hemoglobin: 12.4 g/dL (ref 12.0–16.0)
MCH: 29.3 pg (ref 26.0–34.0)
MCHC: 32.6 g/dL (ref 32.0–36.0)
MCV: 89.9 fL (ref 80.0–100.0)
Platelets: 223 10*3/uL (ref 150–440)
RBC: 4.21 MIL/uL (ref 3.80–5.20)
RDW: 13.9 % (ref 11.5–14.5)
WBC: 6.9 10*3/uL (ref 3.6–11.0)

## 2015-02-19 LAB — GLUCOSE, CAPILLARY
Glucose-Capillary: 133 mg/dL — ABNORMAL HIGH (ref 65–99)
Glucose-Capillary: 161 mg/dL — ABNORMAL HIGH (ref 65–99)

## 2015-02-19 LAB — TROPONIN I: Troponin I: 0.03 ng/mL (ref <0.031)

## 2015-02-19 MED ORDER — CEFTRIAXONE SODIUM IN DEXTROSE 20 MG/ML IV SOLN
1.0000 g | INTRAVENOUS | Status: AC
  Administered 2015-02-20: 1 g via INTRAVENOUS

## 2015-02-19 MED ORDER — CEPHALEXIN 500 MG PO CAPS: 500.0000 mg | ORAL_CAPSULE | Freq: Three times a day (TID) | ORAL | Status: DC

## 2015-02-19 NOTE — ED Provider Notes (Addendum)
[facility] Emergency Department Provider Note  ____________________________________________  Time seen: Approximately 8:12 PM  I have reviewed the triage vital signs and the nursing notes.   HISTORY  Chief Complaint Hypoglycemia    HPI Regina [name] is a 81 y.o. female patient reports she took her usual insulin dose today which is 56 units of Humulin 70/30 and ate her usual food which today for breakfast was oatmeal and too many biscuits and in for supper Q steak and potatoes she does not eat lunch. She reports she was unable to get her sugar to go up. He had a blood sugar in the 50s initially and was drowsy. Sugar has come up now she reports she still feels weak and nauseated. She denies fever chills cough shortness of breath headache vomiting or any other problems   Past Medical History  Diagnosis Date  . Diabetes mellitus without complication   . Hypertension   . Cancer     breast cancer    There are no active problems to display for this patient.   History reviewed. No pertinent past surgical history.  No current outpatient prescriptions on file.  Allergies Review of patient's allergies indicates no known allergies.  History reviewed. No pertinent family history.  Social History History  Substance Use Topics  . Smoking status: Not on file  . Smokeless tobacco: Not on file  . Alcohol Use: Not on file    Review of Systems Constitutional: No fever/chills Eyes: No visual changes. ENT: No sore throat. Cardiovascular: Denies chest pain. Respiratory: Denies shortness of breath. Gastrointestinal: No abdominal pain.no vomiting.  No diarrhea.  No constipation. Genitourinary: Negative for dysuria. Musculoskeletal: Negative for back pain. Skin: Negative for rash. Neurological: Negative for headaches, focal weakness or numbness.  10-point ROS otherwise negative.  ____________________________________________   PHYSICAL EXAM:  VITAL  SIGNS: ED Triage Vitals  Enc Vitals Group     BP 02/19/15 1951 159/71 mmHg     Pulse Rate 02/19/15 1951 71     Resp 02/19/15 1951 20     Temp --      Temp src --      SpO2 02/19/15 1951 72 %     Weight 02/19/15 1951 254 lb 8 oz (115.44 kg)     Height 02/19/15 1951 5\' 3"  (1.6 m)     Head Cir --      Peak Flow --      Pain Score --      Pain Loc --      Pain Edu? --      Excl. in [name]? --     Constitutional: Morbidly obese female Alert and oriented. Well appearing and in no acute distress. Eyes: Conjunctivae are normal. PERRL. EOMI. Head: Atraumatic. Nose: No congestion/rhinnorhea. Mouth/Throat: Mucous membranes are moist.  Oropharynx non-erythematous. Neck: No stridor. Cardiovascular: Normal rate, regular rhythm. Grossly normal heart sounds.  Good peripheral circulation. Respiratory: Normal respiratory effort.  No retractions. Lungs CTAB. Gastrointestinal: Soft and nontender. No distention. No abdominal bruits. No CVA tenderness. Musculoskeletal: No lower extremity tenderness nor edema.  No joint effusions. Neurologic:  Normal speech and language. No gross focal neurologic deficits are appreciated. Speech is normal. No gait instability. Skin:  Skin is warm, dry and intact. No rash noted. Psychiatric: Mood and affect are normal. Speech and behavior are normal.  ____________________________________________   LABS (all labs ordered are listed, but only abnormal results are displayed)  Labs Reviewed  GLUCOSE, CAPILLARY - Abnormal; Notable for the  following:    Glucose-Capillary 133 (*)    All other components within normal limits  CBC  URINALYSIS COMPLETEWITH MICROSCOPIC ([facility] ONLY)  COMPREHENSIVE METABOLIC PANEL  TROPONIN I  CBG MONITORING, ED  CBG MONITORING, ED  CBG MONITORING, ED  I-STAT CHEM 8, ED   ____________________________________________  EKG  EKG read and interpreted by me shows atrial paced rhythm rate of 69 essentially 0 axis no acute changes are  seen ____________________________________________  RADIOLOGY   ____________________________________________   PROCEDURES  ____________________________________________   INITIAL IMPRESSION / ASSESSMENT AND PLAN / ED COURSE  Pertinent labs & imaging results that were available during my care of the patient were reviewed by me and considered in my medical decision making (see chart for details).  Initial troponin was slightly higher than negative so I will repeat the troponin. Also the patient's urine is not back yet and so I will sign the patient out to Dr. Jake Seats BAC H  ____________________________________________   FINAL CLINICAL IMPRESSION(S) / ED DIAGNOSES  Final diagnoses:  None      Nena Polio, MD 02/19/15 2159  X-ray shows no acute disease  Nena Polio, MD 02/19/15 2159

## 2015-02-19 NOTE — ED Provider Notes (Signed)
-----------------------------------------   10:00 PM on 02/19/2015 -----------------------------------------  Assuming care from Dr. Cinda Quest.  In short, Regina [name] is a 81 y.o. female with a chief complaint of Hypoglycemia .  Refer to the original H&P for additional details.  The current plan of care is to follow-up her repeat troponin, urinalysis, and reassess.  ----------------------------------------- 11:38 PM on 02/19/2015 -----------------------------------------  The patient's urinalysis is grossly infected with too numerous to count white blood cells.  I have ordered ceftriaxone 1 g IV and a urine culture.  I will follow up the second troponin and then reassess the patient and tell her the results.  ----------------------------------------- 12:14 AM on 02/20/2015 -----------------------------------------  The patient's second troponin is being drawn now.  She is getting her ceftriaxone.  I updated her about our findings and recommended that she take the Keflex I am prescribing instead of the Cipro that she apparently got as an outpatient recently from [facility].  I am transferring care to Dr. Dahlia Client to follow-up the repeat troponin at discharge as per Dr. Sonny Dandy recommendations.  Hinda Kehr, MD 02/20/15 [phone]

## 2015-02-19 NOTE — ED Notes (Signed)
Pt arrived via EMS from home. Per EMS pt's glucose was 58. EMS gave amp of dextrose and blood sugar now 178. Pt is alert and oriented on arrival.

## 2015-02-20 ENCOUNTER — Encounter: Payer: Self-pay | Admitting: Internal Medicine

## 2015-02-20 DIAGNOSIS — N390 Urinary tract infection, site not specified: Secondary | ICD-10-CM | POA: Diagnosis present

## 2015-02-20 DIAGNOSIS — E1159 Type 2 diabetes mellitus with other circulatory complications: Secondary | ICD-10-CM | POA: Diagnosis present

## 2015-02-20 DIAGNOSIS — E11649 Type 2 diabetes mellitus with hypoglycemia without coma: Secondary | ICD-10-CM | POA: Diagnosis not present

## 2015-02-20 DIAGNOSIS — K219 Gastro-esophageal reflux disease without esophagitis: Secondary | ICD-10-CM | POA: Diagnosis present

## 2015-02-20 DIAGNOSIS — E785 Hyperlipidemia, unspecified: Secondary | ICD-10-CM | POA: Diagnosis present

## 2015-02-20 DIAGNOSIS — Z794 Long term (current) use of insulin: Secondary | ICD-10-CM

## 2015-02-20 DIAGNOSIS — I10 Essential (primary) hypertension: Secondary | ICD-10-CM | POA: Diagnosis present

## 2015-02-20 DIAGNOSIS — I480 Paroxysmal atrial fibrillation: Secondary | ICD-10-CM | POA: Diagnosis present

## 2015-02-20 DIAGNOSIS — R7989 Other specified abnormal findings of blood chemistry: Secondary | ICD-10-CM

## 2015-02-20 DIAGNOSIS — E162 Hypoglycemia, unspecified: Secondary | ICD-10-CM | POA: Diagnosis present

## 2015-02-20 DIAGNOSIS — R778 Other specified abnormalities of plasma proteins: Secondary | ICD-10-CM | POA: Diagnosis present

## 2015-02-20 DIAGNOSIS — E119 Type 2 diabetes mellitus without complications: Secondary | ICD-10-CM

## 2015-02-20 LAB — CBC
HCT: 33 % — ABNORMAL LOW (ref 35.0–47.0)
Hemoglobin: 10.8 g/dL — ABNORMAL LOW (ref 12.0–16.0)
MCH: 29.1 pg (ref 26.0–34.0)
MCHC: 32.6 g/dL (ref 32.0–36.0)
MCV: 89 fL (ref 80.0–100.0)
Platelets: 215 10*3/uL (ref 150–440)
RBC: 3.71 MIL/uL — AB (ref 3.80–5.20)
RDW: 13.7 % (ref 11.5–14.5)
WBC: 6.6 10*3/uL (ref 3.6–11.0)

## 2015-02-20 LAB — BASIC METABOLIC PANEL
Anion gap: 8 (ref 5–15)
BUN: 17 mg/dL (ref 6–20)
CO2: 26 mmol/L (ref 22–32)
Calcium: 8.1 mg/dL — ABNORMAL LOW (ref 8.9–10.3)
Chloride: 103 mmol/L (ref 101–111)
Creatinine, Ser: 1.51 mg/dL — ABNORMAL HIGH (ref 0.44–1.00)
GFR, EST AFRICAN AMERICAN: 37 mL/min — AB (ref >60)
GFR, EST NON AFRICAN AMERICAN: 32 mL/min — AB (ref >60)
GLUCOSE: 256 mg/dL — AB (ref 65–99)
Potassium: 4.1 mmol/L (ref 3.5–5.1)
Sodium: 137 mmol/L (ref 135–145)

## 2015-02-20 LAB — GLUCOSE, CAPILLARY
Glucose-Capillary: 190 mg/dL — ABNORMAL HIGH (ref 65–99)
Glucose-Capillary: 222 mg/dL — ABNORMAL HIGH (ref 65–99)
Glucose-Capillary: 247 mg/dL — ABNORMAL HIGH (ref 65–99)
Glucose-Capillary: 251 mg/dL — ABNORMAL HIGH (ref 65–99)
Glucose-Capillary: 268 mg/dL — ABNORMAL HIGH (ref 65–99)
Glucose-Capillary: 50 mg/dL — ABNORMAL LOW (ref 65–99)

## 2015-02-20 LAB — TROPONIN I: Troponin I: 0.55 ng/mL — ABNORMAL HIGH (ref <0.031)

## 2015-02-20 LAB — PROTIME-INR
INR: 1.8
Prothrombin Time: 21.1 seconds — ABNORMAL HIGH (ref 11.4–15.0)

## 2015-02-20 LAB — HEMOGLOBIN A1C: Hgb A1c MFr Bld: 6.6 % — ABNORMAL HIGH (ref 4.0–6.0)

## 2015-02-20 MED ORDER — WARFARIN - PHARMACIST DOSING INPATIENT: Freq: Every day | Status: DC

## 2015-02-20 MED ORDER — ACETAMINOPHEN 650 MG RE SUPP: 650.0000 mg | Freq: Four times a day (QID) | RECTAL | Status: DC | PRN

## 2015-02-20 MED ORDER — PANTOPRAZOLE SODIUM 40 MG PO TBEC
40.0000 mg | DELAYED_RELEASE_TABLET | Freq: Every day | ORAL | Status: DC
  Administered 2015-02-20 – 2015-02-21 (×2): 40 mg via ORAL
  Filled 2015-02-20 (×2): qty 1

## 2015-02-20 MED ORDER — CEFTRIAXONE SODIUM IN DEXTROSE 20 MG/ML IV SOLN
1.0000 g | INTRAVENOUS | Status: DC
  Administered 2015-02-20: 1 g via INTRAVENOUS
  Filled 2015-02-20 (×2): qty 50

## 2015-02-20 MED ORDER — CEFTRIAXONE SODIUM IN DEXTROSE 20 MG/ML IV SOLN
INTRAVENOUS | Status: AC
  Administered 2015-02-20: 1 g via INTRAVENOUS
  Filled 2015-02-20: qty 50

## 2015-02-20 MED ORDER — BUMETANIDE 1 MG PO TABS
0.5000 mg | ORAL_TABLET | Freq: Every day | ORAL | Status: DC
  Administered 2015-02-20 – 2015-02-21 (×2): 0.5 mg via ORAL
  Filled 2015-02-20 (×2): qty 1

## 2015-02-20 MED ORDER — CLOPIDOGREL BISULFATE 75 MG PO TABS
75.0000 mg | ORAL_TABLET | Freq: Every day | ORAL | Status: DC
  Administered 2015-02-20 – 2015-02-21 (×2): 75 mg via ORAL
  Filled 2015-02-20 (×2): qty 1

## 2015-02-20 MED ORDER — RANOLAZINE ER 500 MG PO TB12
500.0000 mg | ORAL_TABLET | Freq: Two times a day (BID) | ORAL | Status: DC
Start: 2015-02-20 — End: 2015-02-21
  Administered 2015-02-20 – 2015-02-21 (×3): 500 mg via ORAL
  Filled 2015-02-20 (×3): qty 1

## 2015-02-20 MED ORDER — INSULIN ASPART PROT & ASPART (70-30 MIX) 100 UNIT/ML ~~LOC~~ SUSP
50.0000 [IU] | Freq: Every day | SUBCUTANEOUS | Status: DC
  Administered 2015-02-20: 50 [IU] via SUBCUTANEOUS
  Filled 2015-02-20: qty 50

## 2015-02-20 MED ORDER — ENOXAPARIN SODIUM 100 MG/ML ~~LOC~~ SOLN
115.0000 mg | Freq: Once | SUBCUTANEOUS | Status: AC
  Administered 2015-02-20: 115 mg via SUBCUTANEOUS

## 2015-02-20 MED ORDER — INSULIN ASPART 100 UNIT/ML ~~LOC~~ SOLN
0.0000 [IU] | Freq: Four times a day (QID) | SUBCUTANEOUS | Status: DC
  Administered 2015-02-20: 2 [IU] via SUBCUTANEOUS
  Administered 2015-02-20: 5 [IU] via SUBCUTANEOUS
  Filled 2015-02-20: qty 2
  Filled 2015-02-20: qty 5

## 2015-02-20 MED ORDER — SODIUM CHLORIDE 0.9 % IJ SOLN
3.0000 mL | Freq: Two times a day (BID) | INTRAMUSCULAR | Status: DC
  Administered 2015-02-20 – 2015-02-21 (×2): 3 mL via INTRAVENOUS

## 2015-02-20 MED ORDER — ACETAMINOPHEN 325 MG PO TABS: 650.0000 mg | ORAL_TABLET | Freq: Four times a day (QID) | ORAL | Status: DC | PRN

## 2015-02-20 MED ORDER — METOPROLOL TARTRATE 50 MG PO TABS
50.0000 mg | ORAL_TABLET | Freq: Two times a day (BID) | ORAL | Status: DC
  Administered 2015-02-20 – 2015-02-21 (×3): 50 mg via ORAL
  Filled 2015-02-20 (×3): qty 1

## 2015-02-20 MED ORDER — ASPIRIN 81 MG PO CHEW
CHEWABLE_TABLET | ORAL | Status: AC
  Filled 2015-02-20: qty 4

## 2015-02-20 MED ORDER — INSULIN ASPART PROT & ASPART (70-30 MIX) 100 UNIT/ML ~~LOC~~ SUSP: 50.0000 [IU] | Freq: Every day | SUBCUTANEOUS | Status: DC

## 2015-02-20 MED ORDER — INSULIN ASPART PROT & ASPART (70-30 MIX) 100 UNIT/ML ~~LOC~~ SUSP: 30.0000 [IU] | Freq: Every day | SUBCUTANEOUS | Status: DC

## 2015-02-20 MED ORDER — ASPIRIN 81 MG PO CHEW
CHEWABLE_TABLET | ORAL | Status: AC
  Administered 2015-02-20: 324 mg via ORAL
  Filled 2015-02-20: qty 4

## 2015-02-20 MED ORDER — WARFARIN SODIUM 1 MG PO TABS
3.0000 mg | ORAL_TABLET | Freq: Once | ORAL | Status: DC
  Filled 2015-02-20 (×2): qty 3

## 2015-02-20 MED ORDER — LISINOPRIL 10 MG PO TABS
10.0000 mg | ORAL_TABLET | Freq: Every day | ORAL | Status: DC
  Administered 2015-02-21: 10 mg via ORAL
  Filled 2015-02-20: qty 1

## 2015-02-20 MED ORDER — INSULIN GLARGINE 100 UNIT/ML ~~LOC~~ SOLN
20.0000 [IU] | Freq: Every day | SUBCUTANEOUS | Status: DC
  Filled 2015-02-20 (×2): qty 0.2

## 2015-02-20 MED ORDER — ENOXAPARIN SODIUM 100 MG/ML ~~LOC~~ SOLN
SUBCUTANEOUS | Status: AC
  Administered 2015-02-20: 115 mg via SUBCUTANEOUS
  Filled 2015-02-20: qty 2

## 2015-02-20 MED ORDER — LEVOTHYROXINE SODIUM 25 MCG PO TABS
25.0000 ug | ORAL_TABLET | Freq: Every day | ORAL | Status: DC
  Administered 2015-02-21: 25 ug via ORAL
  Filled 2015-02-20: qty 1

## 2015-02-20 MED ORDER — WARFARIN SODIUM 2.5 MG PO TABS
2.5000 mg | ORAL_TABLET | Freq: Every day | ORAL | Status: DC
  Filled 2015-02-20: qty 1

## 2015-02-20 MED ORDER — ASPIRIN 81 MG PO CHEW
324.0000 mg | CHEWABLE_TABLET | Freq: Once | ORAL | Status: AC
  Administered 2015-02-20: 324 mg via ORAL

## 2015-02-20 MED ORDER — INSULIN ASPART 100 UNIT/ML ~~LOC~~ SOLN
0.0000 [IU] | Freq: Three times a day (TID) | SUBCUTANEOUS | Status: DC
  Administered 2015-02-20: 5 [IU] via SUBCUTANEOUS
  Filled 2015-02-20: qty 5

## 2015-02-20 NOTE — ED Provider Notes (Signed)
-----------------------------------------   2:15 AM on 02/20/2015 -----------------------------------------  Assuming care from Dr. Karma Greaser.  In short, Regina [name] is a 81 y.o. female with a chief complaint of Hypoglycemia .  Refer to the original H&P for additional details.  The current plan of care is to follow-up the repeat troponin and reassess the patient.  The patient's repeat troponin came back at 0.55 which is concerning for acute coronary syndrome. I gave the patient a dose of aspirin and Lovenox and will admit the patient to the hospitalist service. I discussed this case with Dr. Jannifer Franklin who accepts the patient onto their service.   Loney Hering, MD 02/20/15 [phone]

## 2015-02-20 NOTE — Progress Notes (Signed)
[facility] [place] NAME: Regina [name]    MR#:  [id number]  DATE OF BIRTH:  [birth year]  SUBJECTIVE:  CHIEF COMPLAINT:  Patient is resting comfortably. Denies any chest pain or shortness of breath  REVIEW OF SYSTEMS:  CONSTITUTIONAL: No fever, fatigue or weakness.  EYES: No blurred or double vision.  EARS, NOSE, AND THROAT: No tinnitus or ear pain.  RESPIRATORY: No cough, shortness of breath, wheezing or hemoptysis.  CARDIOVASCULAR: No chest pain, orthopnea, edema.  GASTROINTESTINAL: No nausea, vomiting, diarrhea or abdominal pain.  GENITOURINARY: No dysuria, hematuria.  ENDOCRINE: No polyuria, nocturia,  HEMATOLOGY: No anemia, easy bruising or bleeding SKIN: No rash or lesion. MUSCULOSKELETAL: No joint pain or arthritis.   NEUROLOGIC: No tingling, numbness, weakness.  PSYCHIATRY: No anxiety or depression.   DRUG ALLERGIES:  No Known Allergies  VITALS:  Blood pressure 139/56, pulse 70, temperature 98.4 F (36.9 C), temperature source Oral, resp. rate 18, height 5\' 3"  (1.6 m), weight 108.455 kg (239 lb 1.6 oz), SpO2 98 %.  PHYSICAL EXAMINATION:  GENERAL:  81 y.o.-year-old patient lying in the bed with no acute distress.  EYES: Pupils equal, round, reactive to light and accommodation. No scleral icterus. Extraocular muscles intact.  HEENT: Head atraumatic, normocephalic. Oropharynx and nasopharynx clear.  NECK:  Supple, no jugular venous distention. No thyroid enlargement, no tenderness.  LUNGS: Normal breath sounds bilaterally, no wheezing, rales,rhonchi or crepitation. No use of accessory muscles of respiration.  CARDIOVASCULAR: S1, S2 normal. No murmurs, rubs, or gallops.  ABDOMEN: Soft, nontender, nondistended. Bowel sounds present. No organomegaly or mass.  EXTREMITIES: No pedal edema, cyanosis, or clubbing.  NEUROLOGIC: Cranial nerves II through XII are intact. Muscle strength 5/5 in all extremities. Sensation intact. Gait  not checked.  PSYCHIATRIC: The patient is alert and oriented x 3.  SKIN: No obvious rash, lesion, or ulcer.    LABORATORY PANEL:   CBC  Recent Labs Lab 02/20/15 0721  WBC 6.6  HGB 10.8*  HCT 33.0*  PLT 215   ------------------------------------------------------------------------------------------------------------------  Chemistries   Recent Labs Lab 02/19/15 2012 02/20/15 0721  NA 139 137  K 3.3* 4.1  CL 104 103  CO2 24 26  GLUCOSE 153* 256*  BUN 17 17  CREATININE 1.65* 1.51*  CALCIUM 8.7* 8.1*  AST 28  --   ALT 12*  --   ALKPHOS 68  --   BILITOT 0.4  --    ------------------------------------------------------------------------------------------------------------------  Cardiac Enzymes  Recent Labs Lab 02/20/15 0721  TROPONINI <0.03   ------------------------------------------------------------------------------------------------------------------  RADIOLOGY:  Dg Chest Portable 1 View  02/19/2015   CLINICAL DATA:  Hypoglycemia.  Weakness.  Diabetes.  Breast cancer.  EXAM: PORTABLE CHEST - 1 VIEW  COMPARISON:  10/25/2014  FINDINGS: Dual lead pacer noted with lead positioning unchanged. Left axillary clips noted.  Mild enlargement of the cardiopericardial silhouette. Mildly tortuous thoracic aorta. The patient is rotated to the right on today's radiograph, reducing diagnostic sensitivity and specificity.  Left mastectomy.  Thoracic spondylosis.  IMPRESSION: 1. Mild enlargement of the cardiopericardial silhouette, without edema. Dual lead pacer noted. 2. No acute findings.   Electronically Signed   By: Van Clines M.D.   On: 02/19/2015 21:01    EKG:   Orders placed or performed during the hospital encounter of 02/19/15  . ED EKG  . ED EKG  . Repeat EKG  . Repeat EKG  . EKG 12-Lead  . EKG 12-Lead  . EKG 12-Lead  .  EKG 12-Lead    ASSESSMENT AND PLAN:     # Elevated troponin - patient has a second troponin which was positive at 0.55 but third  troponin is less than 0.03. Patient is asymptomatic she was given a dose of Lovenox in the ED Pending cardiology consult.   # UTI (lower urinary tract infection) -  UA with  too numerous to count white cells with 2+ leukocyte esterase .  Urine culture is pending  IV Rocephin.  # Hypoglycemia - this has resolved, and her blood sugar is actually hyperglycemic at this time.  Type 2 diabetes mellitus - patient reports using 56 units daily of Humulin 70/30 insulin. We will have her on sliding scale insulin for now, resume Humulin at 50 units   #HTN (hypertension) - chronic stable problem Resume home meds   #Paroxysmal atrial fibrillation - patient reports being on warfarin. We will check a PT/INR, and continue her warfarin dose once we've done a medication reconciliation as patient does not remember what dose she takes. She was recently started on this medication.   #HLD (hyperlipidemia) - patient states she is on Crestor, but does not know the dose. This will be ordered once medication reconciliation is complete.   #GERD (gastroesophageal reflux disease) - we'll have her on a PPI while here, as she takes a PPI at home.     All the records are reviewed and case discussed with Care Management/Social Workerr. Management plans discussed with the patient, family and they are in agreement.  CODE STATUS: Full code  TOTAL TIME TAKING CARE OF THIS PATIENT: 35 minutes.   POSSIBLE D/C IN 1-2 DAYS, DEPENDING ON CLINICAL CONDITION.   Nicholes Mango M.D on 02/20/2015 at 12:35 PM  Between 7am to 6pm - Pager - [phone] After 6pm go to [email or web address] - password [email or web address] [facility]  Office  [phone]  CC: Primary care physician; Donnie Coffin, MD

## 2015-02-20 NOTE — Care Management (Signed)
Consult for CM assessment.  Presents from home.  Lives alone but her son is visiting from [place].  Says she is independent in her adls, uses cane or walker "when I feel like I need it"  And is able to cook for herself.  She denies problems accessing medical care, transportation (uses [facility]- medicaid transportation) or obtaining medications.   Will not discuss any  discharge plans, home health or  facility placement.  What she does say is "I hate it here and I am moving."  Says her granddaughter is helping her with a plan to move back to [place].  Unable to confirm this.  Discussed need to assess ambulation status during progression

## 2015-02-20 NOTE — Progress Notes (Signed)
Inpatient Diabetes Program Recommendations  AACE/ADA: New Consensus Statement on Inpatient Glycemic Control (2013)  Target Ranges:  Prepandial:   less than 140 mg/dL      Peak postprandial:   less than 180 mg/dL (1-2 hours)      Critically ill patients:  140 - 180 mg/dL   Results for Regina [name], Regina [name] (MRN [id number]) as of 02/20/2015 09:02  Ref. Range 02/19/2015 20:00 02/19/2015 21:41 02/20/2015 00:35 02/20/2015 05:10 02/20/2015 07:40  Glucose-Capillary Latest Ref Range: 65-99 mg/dL 133 (H) 161 (H) 247 (H) 268 (H) 222 (H)   Diabetes history: DM2 Outpatient Diabetes medications: Humalog 75/25 56 units daily Current orders for Inpatient glycemic control: Novolog 0-9 units Q6H  Inpatient Diabetes Program Recommendations Insulin - Basal: Patient takes Humalog 75/25 56 units daily as an outpatient. Since patient is NPO at this time recommend using Lantus instead of 70/30 for basal needs. Please consider ordering Lantus 20 units daily (based on 108 kg x 0.2 units). Correction (SSI): Please change frequency of CBGs and Novolog to Q4H while NPO. HgbA1C: A1C is in process.  Thanks, Barnie Alderman, RN, MSN, CCRN, CDE Diabetes Coordinator Inpatient Diabetes Program [phone] (Team Pager from [place] to [place]) [phone] ([place] office) [phone] [facility] office) [phone] [facility] office)

## 2015-02-20 NOTE — H&P (Signed)
[facility] NAME: Regina [name]    MR#:  [id number]  DATE OF BIRTH:  [birth year]  DATE OF ADMISSION:  02/19/2015  PRIMARY CARE PHYSICIAN: Donnie Coffin, MD   REQUESTING/REFERRING PHYSICIAN: Dahlia Client  CHIEF COMPLAINT:   Chief Complaint  Patient presents with  . Hypoglycemia    HISTORY OF PRESENT ILLNESS:  Regina [name]  is a 81 y.o. female who presents with a glycemic. The patient states she was at her home and she checked her blood sugar because she wasn't feeling well, and found that it was significantly low in the 50s. She had some juice, and at some ice cream, but her blood sugar still did not come up. Her son then called EMS who was able to give her some IV dextrose, which brought her glucose level up. They then brought her here to the ED she was evaluated and found to have a UTI. On interview the patient does state that she has been having some urinary pressure lately. Patient's first troponin in the ED was barely positive, so a second troponin was ordered. This was truly positive at 0.55, and hospitalists were called for admission. Patient did not ever have any chest pain. However she does have a cardiac history including 2 stent placements in the past.  PAST MEDICAL HISTORY:   Past Medical History  Diagnosis Date  . Diabetes mellitus without complication   . Hypertension   . Cancer     breast cancer  . CAD (coronary artery disease)     s/p cath in 2009 with 2 stents  . HLD (hyperlipidemia)   . Paroxysmal atrial fibrillation   . GERD (gastroesophageal reflux disease)   . Hypothyroidism     PAST SURGICAL HISTORY:   Past Surgical History  Procedure Laterality Date  . Cholecystectomy    . Breast lumpectomy    . Mastectomy Left     SOCIAL HISTORY:   History  Substance Use Topics  . Smoking status: Never Smoker   . Smokeless tobacco: Not on file  . Alcohol Use: No    FAMILY HISTORY:   Family History   Problem Relation Age of Onset  . Kidney failure Mother   . Lung cancer Father     DRUG ALLERGIES:  No Known Allergies  MEDICATIONS AT HOME:   Prior to Admission medications   Medication Sig Start Date End Date Taking? Authorizing Provider  bumetanide (BUMEX) 0.5 MG tablet Take 0.5 mg by mouth daily.   Yes Historical Provider, MD  clopidogrel (PLAVIX) 75 MG tablet Take 75 mg by mouth daily.   Yes Historical Provider, MD  esomeprazole (NEXIUM) 40 MG capsule Take 40 mg by mouth daily at 12 noon.   Yes Historical Provider, MD  Insulin Lispro Prot & Lispro (HUMALOG MIX 75/25 KWIKPEN [place]) Inject 56 Units into the skin daily.   Yes Historical Provider, MD  levothyroxine (SYNTHROID, LEVOTHROID) 25 MCG tablet Take 25 mcg by mouth daily before breakfast.   Yes Historical Provider, MD  lisinopril (PRINIVIL,ZESTRIL) 10 MG tablet Take 10 mg by mouth daily.   Yes Historical Provider, MD  metoprolol (LOPRESSOR) 50 MG tablet Take 50 mg by mouth 2 (two) times daily.   Yes Historical Provider, MD  paricalcitol (ZEMPLAR) 1 MCG capsule Take 1 mcg by mouth daily.   Yes Historical Provider, MD  ranolazine (RANEXA) 500 MG 12 hr tablet Take 500 mg by mouth 2 (two) times daily.   Yes Historical  Provider, MD  warfarin (COUMADIN) 1 MG tablet Take 1 mg by mouth daily.   Yes Historical Provider, MD  cephALEXin (KEFLEX) 500 MG capsule Take 1 capsule (500 mg total) by mouth 3 (three) times daily. 02/19/15   Hinda Kehr, MD    REVIEW OF SYSTEMS:  Review of Systems  Constitutional: Positive for malaise/fatigue (associated with her low blood sugar.). Negative for fever, chills and weight loss.  HENT: Negative for ear pain, hearing loss and tinnitus.   Eyes: Negative for blurred vision, double vision, pain and redness.  Respiratory: Negative for cough, hemoptysis and shortness of breath.   Cardiovascular: Negative for chest pain, palpitations, orthopnea and leg swelling.  Gastrointestinal: Negative for nausea,  vomiting, abdominal pain, diarrhea and constipation.  Genitourinary: Negative for dysuria, frequency and hematuria.  Musculoskeletal: Negative for back pain, joint pain and neck pain.  Skin:       No acne, rash, or lesions  Neurological: Negative for dizziness, tremors, focal weakness and weakness.  Endo/Heme/Allergies: Negative for polydipsia. Does not bruise/bleed easily.  Psychiatric/Behavioral: Negative for depression. The patient is not nervous/anxious and does not have insomnia.      VITAL SIGNS:   Filed Vitals:   02/19/15 1951 02/19/15 2300 02/20/15 0026  BP: 159/71 142/78 191/80  Pulse: 71 67 84  Resp: 20  18  Height: 5\' 3"  (1.6 m)    Weight: 115.44 kg (254 lb 8 oz)    SpO2: 72% 98% 99%   Wt Readings from Last 3 Encounters:  02/19/15 115.44 kg (254 lb 8 oz)    PHYSICAL EXAMINATION:  Physical Exam  Constitutional: She is oriented to person, place, and time. She appears well-developed and well-nourished. No distress.  HENT:  Head: Normocephalic and atraumatic.  Mouth/Throat: Oropharynx is clear and moist.  Eyes: Conjunctivae and EOM are normal. Pupils are equal, round, and reactive to light. No scleral icterus.  Neck: Normal range of motion. Neck supple. No JVD present. No thyromegaly present.  Cardiovascular: Normal rate, regular rhythm and intact distal pulses.  Exam reveals no gallop and no friction rub.   No murmur heard. Respiratory: Effort normal and breath sounds normal. No respiratory distress. She has no wheezes. She has no rales.  GI: Soft. Bowel sounds are normal. She exhibits no distension. There is no tenderness.  Musculoskeletal: Normal range of motion. She exhibits no edema.  No arthritis, no gout  Lymphadenopathy:    She has no cervical adenopathy.  Neurological: She is alert and oriented to person, place, and time. No cranial nerve deficit.  No dysarthria, no aphasia  Skin: Skin is warm and dry. No rash noted. No erythema.  Psychiatric: She has a  normal mood and affect. Her behavior is normal. Judgment and thought content normal.    LABORATORY PANEL:   CBC  Recent Labs Lab 02/19/15 2012  WBC 6.9  HGB 12.4  HCT 37.9  PLT 223   ------------------------------------------------------------------------------------------------------------------  Chemistries   Recent Labs Lab 02/19/15 2012  NA 139  K 3.3*  CL 104  CO2 24  GLUCOSE 153*  BUN 17  CREATININE 1.65*  CALCIUM 8.7*  AST 28  ALT 12*  ALKPHOS 68  BILITOT 0.4   ------------------------------------------------------------------------------------------------------------------  Cardiac Enzymes  Recent Labs Lab 02/20/15 0042  TROPONINI 0.55*   ------------------------------------------------------------------------------------------------------------------  RADIOLOGY:  Dg Chest Portable 1 View  02/19/2015   CLINICAL DATA:  Hypoglycemia.  Weakness.  Diabetes.  Breast cancer.  EXAM: PORTABLE CHEST - 1 VIEW  COMPARISON:  10/25/2014  FINDINGS:  Dual lead pacer noted with lead positioning unchanged. Left axillary clips noted.  Mild enlargement of the cardiopericardial silhouette. Mildly tortuous thoracic aorta. The patient is rotated to the right on today's radiograph, reducing diagnostic sensitivity and specificity.  Left mastectomy.  Thoracic spondylosis.  IMPRESSION: 1. Mild enlargement of the cardiopericardial silhouette, without edema. Dual lead pacer noted. 2. No acute findings.   Electronically Signed   By: Van Clines M.D.   On: 02/19/2015 21:01    EKG:   Orders placed or performed during the hospital encounter of 02/19/15  . ED EKG  . ED EKG  . Repeat EKG  . Repeat EKG    IMPRESSION AND PLAN:  Principal Problem:   Elevated troponin - patient has a second troponin which was positive at 0.55. We will admit her to observation, continue to trend her cardiac enzymes, she was given a dose of Lovenox in the ED, we'll get a cardiology consult. Defer  to cardiology's recommendations as to whether or not she needs an echocardiogram, as she does follow with a cardiologist outpatient and it is unclear how recent her last echocardiogram was. Active Problems:   UTI (lower urinary tract infection) - as demonstrated by urinary pressure clinically, and too numerous to count white cells with 2+ leukocyte esterase on her UA. Urine culture is sent, and the patient was started on IV Rocephin.   Hypoglycemia - this has resolved, and her blood sugar is actually hyperglycemic at this time.   Type 2 diabetes mellitus - patient reports using 56 units daily of Humulin 70/30 insulin. We will have her on sliding scale insulin for now, as we are keeping her nothing by mouth in case she needs any cardiac procedure. Her insulin regimen can be changed to include her home insulin once she is eating again, and based on her fingerstick glucose trend.   HTN (hypertension) - chronic stable problem, patient's medication reconciliation is not done, she will need her home medications started once this is completed in the morning. Her blood pressure stable at this time.   Paroxysmal atrial fibrillation - patient reports being on warfarin. We will check a PT/INR, and continue her warfarin dose once we've done a medication reconciliation as patient does not remember what dose she takes. She was recently started on this medication.   HLD (hyperlipidemia) - patient states she is on Crestor, but does not know the dose. This will be ordered once medication reconciliation is complete.   GERD (gastroesophageal reflux disease) - we'll have her on a PPI while here, as she takes a PPI at home.  All the records are reviewed and case discussed with ED provider. Management plans discussed with the patient and/or family.  DVT PROPHYLAXIS: Systemic anticoagulation  ADMISSION STATUS: Observation  CODE STATUS: Full  TOTAL TIME TAKING CARE OF THIS PATIENT: 45 minutes.    Regina Pigue  [name] 02/20/2015, 3:00 AM  [facility]  Office  [phone]  CC: Primary care physician; Donnie Coffin, MD

## 2015-02-20 NOTE — ED Notes (Signed)
TROPONIN: 0.55 from Nevin Bloodgood, Dr Dahlia Client notified

## 2015-02-20 NOTE — Consult Note (Signed)
[facility] CONSULT NOTE  Patient ID: Regina [name] MRN: [id number] DOB/AGE: [birth year] 79 y.o.  Admit date: 02/19/2015 Referring Physician Dr. Margaretmary Eddy Primary Physician   Primary Cardiologist Dr. Lavera Guise Reason for Consultation troponin  HPI: Patient is a 81 year old female with history of paroxysmal atrial fibrillation treated with anticoagulation with warfarin and rate control with metoprolol, she has a dual-chamber pacemaker for sick sinus syndrome who presented to the emergency room with complaints of hypoglycemia and urinary tract infection. As part of the routine emergency room workup, serum troponin was drawn. The initial serum troponin was 0.03 with subsequent value of 0.5 5-1/3 value of 0.03. EKG revealed atrial paced and ventricular sensed rhythm. She has no chest pain. She has a history of PCI 2 with no current ischemic changes.. She is currently stable. Her UTI is being treated with antibiotically. She has no chest pain shortness of breath syncope or presyncope.  ROS Review of Systems - History obtained from chart review and the patient General ROS: positive for  - Low blood sugar and urinary tract symptoms Respiratory ROS: no cough, shortness of breath, or wheezing Cardiovascular ROS: no chest pain or dyspnea on exertion Gastrointestinal ROS: no abdominal pain, change in bowel habits, or black or bloody stools Musculoskeletal ROS: negative Neurological ROS: no TIA or stroke symptoms   Past Medical History  Diagnosis Date  . Diabetes mellitus without complication   . Hypertension   . Cancer     breast cancer  . CAD (coronary artery disease)     s/p cath in 2009 with 2 stents  . HLD (hyperlipidemia)   . Paroxysmal atrial fibrillation   . GERD (gastroesophageal reflux disease)   . Hypothyroidism     Family History  Problem Relation Age of Onset  . Kidney failure Mother   . Lung cancer Father     History   Social  History  . Marital Status: Divorced    Spouse Name: N/A  . Number of Children: N/A  . Years of Education: N/A   Occupational History  . Not on file.   Social History Main Topics  . Smoking status: Never Smoker   . Smokeless tobacco: Not on file  . Alcohol Use: No  . Drug Use: No  . Sexual Activity: Not on file   Other Topics Concern  . Not on file   Social History Narrative    Past Surgical History  Procedure Laterality Date  . Cholecystectomy    . Breast lumpectomy    . Mastectomy Left      Prescriptions prior to admission  Medication Sig Dispense Refill Last Dose  . bumetanide (BUMEX) 0.5 MG tablet Take 0.5 mg by mouth daily.     . clopidogrel (PLAVIX) 75 MG tablet Take 75 mg by mouth daily.     Marland Kitchen esomeprazole (NEXIUM) 40 MG capsule Take 40 mg by mouth daily at 12 noon.     . Insulin Lispro Prot & Lispro (HUMALOG MIX 75/25 KWIKPEN [place]) Inject 56 Units into the skin daily.     Marland Kitchen levothyroxine (SYNTHROID, LEVOTHROID) 25 MCG tablet Take 25 mcg by mouth daily before breakfast.     . lisinopril (PRINIVIL,ZESTRIL) 10 MG tablet Take 10 mg by mouth daily.     . metoprolol (LOPRESSOR) 50 MG tablet Take 50 mg by mouth 2 (two) times daily.     . paricalcitol (ZEMPLAR) 1 MCG capsule Take 1 mcg by mouth daily.     Marland Kitchen  ranolazine (RANEXA) 500 MG 12 hr tablet Take 500 mg by mouth 2 (two) times daily.     Marland Kitchen warfarin (COUMADIN) 1 MG tablet Take 1 mg by mouth daily.       Physical Exam: Blood pressure 139/56, pulse 70, temperature 98.4 F (36.9 C), temperature source Oral, resp. rate 18, height 5\' 3"  (1.6 m), weight 108.455 kg (239 lb 1.6 oz), SpO2 98 %.  General appearance: alert and cooperative Head: Normocephalic, without obvious abnormality, atraumatic Neck: no adenopathy, no carotid bruit, no JVD, supple, symmetrical, trachea midline and thyroid not enlarged, symmetric, no tenderness/mass/nodules Resp: clear to auscultation bilaterally Cardio: regular rate and rhythm and systolic  murmur: early systolic 1/6, medium pitch at lower left sternal border GI: soft, non-tender; bowel sounds normal; no masses,  no organomegaly Extremities: extremities normal, atraumatic, no cyanosis or edema Pulses: 2+ and symmetric Neurologic: Grossly normal Labs:   Lab Results  Component Value Date   WBC 6.6 02/20/2015   HGB 10.8* 02/20/2015   HCT 33.0* 02/20/2015   MCV 89.0 02/20/2015   PLT 215 02/20/2015    Recent Labs Lab 02/19/15 2012 02/20/15 0721  NA 139 137  K 3.3* 4.1  CL 104 103  CO2 24 26  BUN 17 17  CREATININE 1.65* 1.51*  CALCIUM 8.7* 8.1*  PROT 6.7  --   BILITOT 0.4  --   ALKPHOS 68  --   ALT 12*  --   AST 28  --   GLUCOSE 153* 256*   Lab Results  Component Value Date   TROPONINI <0.03 02/20/2015      Radiology: Chest x-ray revealed no pulmonary edema with pacemaker with dual leads. EKG: Atrial paced ventricular sensed with no ischemia  ASSESSMENT AND PLAN:    #1. Abnormal troponin. Value does not appear to represent ischemia. Initial and third levels were less than 0.03 with a value in between of 0.55. This appears to be a spurious result. Patient has no ischemic symptoms. She had no chest pain. EKG shows atrial pacing ventricular sensing. She does not appear ischemic. Would stop anticoagulation and continue to treat UTI with no further cardiac workup indicated. We'll follow-up with her cardiologist at home  #2. Sick sinus syndrome-dual-chamber pacemaker appears to be functioning normally. We continue to follow-up per her primary cardiologist  #3 atrial fibrillation-in sinus rhythm at present with atrial pacing and ventricular sensing. Continue with warfarin and metoprolol  #4 UTI-continue to treat as you're doing.  #5. Diabetes mellitus-continue with close follow-up of her glucose.  Signed: Teodoro Spray MD, [facility] 02/20/2015, 1:00 PM

## 2015-02-20 NOTE — Progress Notes (Signed)
ANTICOAGULATION CONSULT NOTE - Initial Consult  Pharmacy Consult for Warfarin Indication: atrial fibrillation  No Known Allergies  Patient Measurements: Height: 5\' 3"  (160 cm) Weight: 239 lb 1.6 oz (108.455 kg) IBW/kg (Calculated) : 52.4  Vital Signs: Temp: 98.4 F (36.9 C) (07/11 0756) Temp Source: Oral (07/11 0756) BP: 139/56 mmHg (07/11 1128) Pulse Rate: 70 (07/11 1128)  Labs:  Recent Labs  02/19/15 2012 02/20/15 0042 02/20/15 0721 02/20/15 1402  HGB 12.4  --  10.8*  --   HCT 37.9  --  33.0*  --   PLT 223  --  215  --   LABPROT  --   --   --  21.1*  INR  --   --   --  1.80  CREATININE 1.65*  --  1.51*  --   TROPONINI 0.03 0.55* <0.03  --     Estimated Creatinine Clearance: 35.7 mL/min (by C-G formula based on Cr of 1.51).   Medical History: Past Medical History  Diagnosis Date  . Diabetes mellitus without complication   . Hypertension   . Cancer     breast cancer  . CAD (coronary artery disease)     s/p cath in 2009 with 2 stents  . HLD (hyperlipidemia)   . Paroxysmal atrial fibrillation   . GERD (gastroesophageal reflux disease)   . Hypothyroidism     Medications:  Scheduled:  . bumetanide  0.5 mg Oral Daily  . cefTRIAXone (ROCEPHIN)  IV  1 g Intravenous Q24H  . clopidogrel  75 mg Oral Daily  . insulin aspart  0-9 Units Subcutaneous TID AC & HS  . insulin aspart protamine- aspart  50 Units Subcutaneous Q supper  . [START ON 02/21/2015] levothyroxine  25 mcg Oral QAC breakfast  . [START ON 02/21/2015] lisinopril  10 mg Oral Daily  . metoprolol tartrate  50 mg Oral BID  . pantoprazole  40 mg Oral Daily  . ranolazine  500 mg Oral BID  . sodium chloride  3 mL Intravenous Q12H  . [START ON 02/21/2015] warfarin  2.5 mg Oral q1800  . warfarin  3 mg Oral ONCE-1800  . Warfarin - Pharmacist Dosing Inpatient   Does not apply q1800    Assessment: Pharmacy consulted to dose warfarin for afib in this 81 year old female. Patient admitted with suspected ACS,  received 1 dose of lovenox today at 03:00, per cardiology/attending - no ischemia, will resume warfarin dosing without additional lovenox.   Patient takes coumadin 2.5mg  daily at home, not clear if she took her dose yesterday.  Goal of Therapy:  INR 2-3   Plan:  Patient slightly sub therapeutic today. Will give coumadin 3mg  x 1 and resume home regimen tomorrow.  Will check INR daily while on antimicrobials per policy.  Pharmacy to follow per consult   Rexene Edison, PharmD Clinical Pharmacist 02/20/2015,3:43 PM

## 2015-02-20 NOTE — Progress Notes (Signed)
Assessment completed.  SL rt hand flushes well.  No distress on ra.  OOB in chair, chair alarm in place.  Denies pain or needs at this time.  Remains NPO for possible testing this am.  Denies need.  CB in reach.

## 2015-02-21 DIAGNOSIS — E11649 Type 2 diabetes mellitus with hypoglycemia without coma: Secondary | ICD-10-CM | POA: Diagnosis not present

## 2015-02-21 LAB — CBC
HCT: 33.9 % — ABNORMAL LOW (ref 35.0–47.0)
Hemoglobin: 10.8 g/dL — ABNORMAL LOW (ref 12.0–16.0)
MCH: 28.6 pg (ref 26.0–34.0)
MCHC: 31.9 g/dL — ABNORMAL LOW (ref 32.0–36.0)
MCV: 89.8 fL (ref 80.0–100.0)
Platelets: 230 K/uL (ref 150–440)
RBC: 3.78 MIL/uL — ABNORMAL LOW (ref 3.80–5.20)
RDW: 13.9 % (ref 11.5–14.5)
WBC: 6.4 K/uL (ref 3.6–11.0)

## 2015-02-21 LAB — BASIC METABOLIC PANEL
Anion gap: 9 (ref 5–15)
BUN: 19 mg/dL (ref 6–20)
CO2: 27 mmol/L (ref 22–32)
Calcium: 8.6 mg/dL — ABNORMAL LOW (ref 8.9–10.3)
Chloride: 103 mmol/L (ref 101–111)
Creatinine, Ser: 1.39 mg/dL — ABNORMAL HIGH (ref 0.44–1.00)
GFR, EST AFRICAN AMERICAN: 41 mL/min — AB (ref >60)
GFR, EST NON AFRICAN AMERICAN: 35 mL/min — AB (ref >60)
Glucose, Bld: 141 mg/dL — ABNORMAL HIGH (ref 65–99)
POTASSIUM: 3.7 mmol/L (ref 3.5–5.1)
Sodium: 139 mmol/L (ref 135–145)

## 2015-02-21 LAB — GLUCOSE, CAPILLARY
GLUCOSE-CAPILLARY: 230 mg/dL — AB (ref 65–99)
Glucose-Capillary: 138 mg/dL — ABNORMAL HIGH (ref 65–99)
Glucose-Capillary: 58 mg/dL — ABNORMAL LOW (ref 65–99)
Glucose-Capillary: 83 mg/dL (ref 65–99)

## 2015-02-21 LAB — PROTIME-INR
INR: 1.59
Prothrombin Time: 19.1 seconds — ABNORMAL HIGH (ref 11.4–15.0)

## 2015-02-21 LAB — URINE CULTURE: Special Requests: NORMAL

## 2015-02-21 MED ORDER — ACETAMINOPHEN 325 MG PO TABS: 650.0000 mg | ORAL_TABLET | Freq: Four times a day (QID) | ORAL | Status: DC | PRN

## 2015-02-21 MED ORDER — INSULIN ASPART PROT & ASPART (70-30 MIX) 100 UNIT/ML ~~LOC~~ SUSP: 28.0000 [IU] | Freq: Two times a day (BID) | SUBCUTANEOUS | Status: DC

## 2015-02-21 MED ORDER — LIVING WELL WITH DIABETES BOOK
Freq: Once | Status: AC
  Administered 2015-02-21: 09:00:00
  Filled 2015-02-21: qty 1

## 2015-02-21 MED ORDER — INSULIN ASPART 100 UNIT/ML ~~LOC~~ SOLN: 0.0000 [IU] | Freq: Four times a day (QID) | SUBCUTANEOUS | Status: DC
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
## 2015-02-21 NOTE — Clinical Social Work Note (Signed)
CSW spoke to pt and was able to give her contact info for meals on wheels.  Pt stated that she would contact them and see if she wants to use their service

## 2015-02-21 NOTE — Progress Notes (Signed)
MD making rounds. Discharge orders received. IV removed. Prescriptions electronically submitted to pharmacy. Diabetes Education manual provided and explained to patient. Patient verbalized understanding, but had negative attitude regarding information. Reiterated the importance of following the education, which included the diet. Son at bedside for education, as well. Discharge paperwork provided, explained, signed and witnessed. No unanswered questions. Discharged via wheelchair by volunteer services. Belongings sent with patient and family.

## 2015-02-21 NOTE — Progress Notes (Signed)
Pt had episode of hypoglycemia.  Got 50 units 70/30 insulin with dinner (glucose 250s), then had bedtime check at 50 with symptoms.  States she usually takes her 70/30 with breakfast at home, and also is not eating as much here in the hospital.  Glucose checks and sliding coverage changed to q6h for tonight to monitor, and she likely needs insulin regimen evaluated.  Jacqulyn Bath, MD [facility] 02/21/2015, 12:16 AM

## 2015-02-21 NOTE — Progress Notes (Signed)
02/21/15  Met with patient at the bedside- I wanted to discuss insulin, timing of administering it and how it works.  She reveals that she takes the insulin in the morning depending on what her fasting blood sugars are- if it is low, she doesn't take it at all and sometimes she takes it after she eats.  She states she eats around 10am, then skips lunch, eats supper at 3-4pm and eats sweets in the evening to keep from going too low.    When I asked what time she has low blood sugars- she states "I don't have low blood sugars" but in ongoing discussion states low blood sugars happen after supper and "through the night".   She is reluctant to change her current insulin; to divide the 58 units 70/30 given in the am (at home) to 28 units bid with breakfast and supper because "I don't want to change what the doctor ordered" .  To avoid excess sweets at night, to prevent low blood sugars and to keep blood sugars more stable- I suggested the insulin may be better split in 2 doses.   She eats cupcakes, "dippers", ice cream, milk, cereal, cookies at bedtime to prevent middle of the night low blood sugars- I wrote and recommended carbs and proteins for more stable control through the night: crackers and cheese or crackers and peanut butter or nabs and a glass of milk or 1/2 cup mini wheats and peanut butter as better choices.  She drink sweet tea (can't get diet at [employer]) and sips on orange juice through the [place].   I reviewed the treatment of low blood sugars and the need to limit excess sugar intake when treating lows and to follow the initial treatment of low blood sugars with nabs or peanut butter and crackers (protein and carb).  She got a phone call during our conversation and was in tears - complaining of how we were feeding her.  I explained the need to limit fats and sugars and she stated "I eat what I want to eat".   Gentry Fitz, RN, BA, MHA, CDE Diabetes Coordinator Inpatient Diabetes Program   [phone] (Team Pager) [phone] ([place]) 02/21/2015 11:51 AM

## 2015-02-21 NOTE — Discharge Summary (Signed)
[facility] NAME: Regina [name]    MR#:  [id number]  DATE OF BIRTH:  [birth year]  DATE OF ADMISSION:  02/19/2015 ADMITTING PHYSICIAN: Regina Coon, MD  DATE OF DISCHARGE: 02/21/2015 PRIMARY CARE PHYSICIAN: Regina Coffin, MD    ADMISSION DIAGNOSIS:  Weak [R53.1] Hypoglycemia [E16.2] Elevated troponin [R79.89] UTI (urinary tract infection), uncomplicated [D17.6]  DISCHARGE DIAGNOSIS:  Principal Problem:   Elevated troponin -probably technical error Acute cystitis Active Problems:   UTI (lower urinary tract infection)   Hypoglycemia   Type 2 diabetes mellitus   HTN (hypertension)   HLD (hyperlipidemia)   Paroxysmal atrial fibrillation   GERD (gastroesophageal reflux disease)   SECONDARY DIAGNOSIS:   Past Medical History  Diagnosis Date  . Diabetes mellitus without complication   . Hypertension   . Cancer     breast cancer  . CAD (coronary artery disease)     s/p cath in 2009 with 2 stents  . HLD (hyperlipidemia)   . Paroxysmal atrial fibrillation   . GERD (gastroesophageal reflux disease)   . Hypothyroidism     HOSPITAL COURSE:   # Elevated troponin - patient has a second troponin which was positive at 0.55 but third troponin is less than 0.03. Patient is asymptomatic she was given a dose of Lovenox in the ED 1 time elevation in troponin is probably a technical error. No other recommendations by cardiology Outpatient follow-up with patient's cardiologist in 2 weeks is recommended  # UTI (lower urinary tract infection) -  UA with too numerous to count white cells with 2+ leukocyte esterase .  Urine culture is with multiple bacteria, probably a contaminant Given IV Rocephin during the hospital course Patient can continue Keflex by mouth 3 times a Regina [name] which was prescribed by her primary care physician  # Hypoglycemia - this has resolved, and her blood sugar is actually hyperglycemic at this time.   Type 2 diabetes mellitus - patient reports using 56 units daily of Humulin 70/30 insulin. Received sliding scale insulin , changed 70/30-28 units twice a Regina [name]   #HTN (hypertension) - chronic stable problem Resume home meds   #Paroxysmal atrial fibrillation - patient reports being on warfarin. PT/INR subtherapeutic, and continue her warfarin 2.5 mg and repeat PT/INR in a.m. on July 13, PCP to adjust the dose depending on the results. She was recently started on this medication.   #HLD (hyperlipidemia) - patient states she is on Crestor, but does not know the dose. This will be ordered once medication reconciliation is complete.   #GERD (gastroesophageal reflux disease) - we'll have her on a PPI while here, as she takes a PPI at home.     DISCHARGE CONDITIONS:   Satisfactory  CONSULTS OBTAINED:  Treatment Team:  Regina Spray, MD   PROCEDURES none  DRUG ALLERGIES:  No Known Allergies  DISCHARGE MEDICATIONS:   Current Discharge Medication List    START taking these medications   Details  acetaminophen (TYLENOL) 325 MG tablet Take 2 tablets (650 mg total) by mouth every 6 (six) hours as needed for mild pain (or Fever >/= 101).    cephALEXin (KEFLEX) 500 MG capsule Take 1 capsule (500 mg total) by mouth 3 (three) times daily. Qty: 36 capsule, Refills: 0    insulin aspart protamine- aspart (NOVOLOG MIX 70/30) (70-30) 100 UNIT/ML injection Inject 0.28 mLs (28 Units total) into the skin 2 (two) times daily with a meal. Qty: 10 mL, Refills: 11  CONTINUE these medications which have NOT CHANGED   Details  bumetanide (BUMEX) 2 MG tablet Take 2 mg by mouth daily.    clopidogrel (PLAVIX) 75 MG tablet Take 75 mg by mouth daily.    esomeprazole (NEXIUM) 40 MG capsule Take 40 mg by mouth daily.     levothyroxine (SYNTHROID, LEVOTHROID) 50 MCG tablet Take 50 mcg by mouth daily.    lisinopril (PRINIVIL,ZESTRIL) 20 MG tablet Take 20 mg by mouth daily.    metoprolol  succinate (TOPROL-XL) 50 MG 24 hr tablet Take 50 mg by mouth 2 (two) times daily.    paricalcitol (ZEMPLAR) 1 MCG capsule Take 1 mcg by mouth 3 (three) times a week. Pt takes on Monday, Wednesday, and Friday.    ranolazine (RANEXA) 500 MG 12 hr tablet Take 500 mg by mouth 2 (two) times daily.    rosuvastatin (CRESTOR) 10 MG tablet Take 10 mg by mouth daily.    warfarin (COUMADIN) 2.5 MG tablet Take 2.5 mg by mouth daily.      STOP taking these medications     Insulin NPH Isophane & Regular (HUMULIN 70/30 Regina [name])          DISCHARGE INSTRUCTIONS:   Follow-up with primary care physician in a week, PCP to repeat PT/INR in a.m. on July 13 and the change Coumadin dose accordingly Outpatient follow-up with cardiology in 2 weeks Activity as tolerated  DIET:  Diabetic diet and Low fat, Low cholesterol diet  DISCHARGE CONDITION:  Fair  ACTIVITY:  Activity as tolerated  OXYGEN:  Home Oxygen: No.   Oxygen Delivery: room air  DISCHARGE LOCATION:  home   If you experience worsening of your admission symptoms, develop shortness of breath, life threatening emergency, suicidal or homicidal thoughts you must seek medical attention immediately by calling 911 or calling your MD immediately  if symptoms less severe.  You Must read complete instructions/literature along with all the possible adverse reactions/side effects for all the Medicines you take and that have been prescribed to you. Take any new Medicines after you have completely understood and accpet all the possible adverse reactions/side effects.   Please note  You were cared for by a hospitalist during your hospital stay. If you have any questions about your discharge medications or the care you received while you were in the hospital after you are discharged, you can call the unit and asked to speak with the hospitalist on call if the hospitalist that took care of you is not available. Once you are discharged, your primary care  physician will handle any further medical issues. Please note that NO REFILLS for any discharge medications will be authorized once you are discharged, as it is imperative that you return to your primary care physician (or establish a relationship with a primary care physician if you do not have one) for your aftercare needs so that they can reassess your need for medications and monitor your lab values.     Today  Chief Complaint  Patient presents with  . Hypoglycemia   Resting comfortably. Did not like hospital food, not eating properly. Hypoglycemia is improved. Thinks she would be better off at home with home food  ROs-denies any today CONSTITUTIONAL: Denies fevers, chills. Denies any fatigue, weakness.  EYES: Denies blurry vision, double vision, eye pain. EARS, NOSE, THROAT: Denies tinnitus, ear pain, hearing loss. RESPIRATORY: Denies cough, wheeze, shortness of breath.  CARDIOVASCULAR: Denies chest pain, palpitations, edema.  GASTROINTESTINAL: Denies nausea, vomiting, diarrhea, abdominal pain. Denies bright red  blood per rectum. GENITOURINARY: Denies dysuria, hematuria. ENDOCRINE: Denies nocturia or thyroid problems. HEMATOLOGIC AND LYMPHATIC: Denies easy bruising or bleeding. SKIN: Denies rash or lesion. MUSCULOSKELETAL: Denies pain in neck, back, shoulder, knees, hips or arthritic symptoms.  NEUROLOGIC: Denies paralysis, paresthesias.  PSYCHIATRIC: Denies anxiety or depressive symptoms.   VITAL SIGNS:  Blood pressure 111/45, pulse 72, temperature 97.6 F (36.4 C), temperature source Oral, resp. rate 20, height 5\' 3"  (1.6 m), weight 108.455 kg (239 lb 1.6 oz), SpO2 99 %.  I/O:   Intake/Output Summary (Last 24 hours) at 02/21/15 1114 Last data filed at 02/21/15 0902  Gross per 24 hour  Intake    360 ml  Output    750 ml  Net   -390 ml    PHYSICAL EXAMINATION:  GENERAL:  81 y.o.-year-old patient lying in the bed with no acute distress.  EYES: Pupils equal, round,  reactive to light and accommodation. No scleral icterus. Extraocular muscles intact.  HEENT: Head atraumatic, normocephalic. Oropharynx and nasopharynx clear.  NECK:  Supple, no jugular venous distention. No thyroid enlargement, no tenderness.  LUNGS: Normal breath sounds bilaterally, no wheezing, rales,rhonchi or crepitation. No use of accessory muscles of respiration.  CARDIOVASCULAR: S1, S2 normal. No murmurs, rubs, or gallops.  ABDOMEN: Soft, non-tender, non-distended. Bowel sounds present. No organomegaly or mass.  EXTREMITIES: No pedal edema, cyanosis, or clubbing.  NEUROLOGIC: Cranial nerves II through XII are intact. Muscle strength 5/5 in all extremities. Sensation intact. Gait not checked.  PSYCHIATRIC: The patient is alert and oriented x 3.  SKIN: No obvious rash, lesion, or ulcer.   DATA REVIEW:   CBC  Recent Labs Lab 02/21/15 0519  WBC 6.4  HGB 10.8*  HCT 33.9*  PLT 230    Chemistries   Recent Labs Lab 02/19/15 2012  02/21/15 0519  NA 139  < > 139  K 3.3*  < > 3.7  CL 104  < > 103  CO2 24  < > 27  GLUCOSE 153*  < > 141*  BUN 17  < > 19  CREATININE 1.65*  < > 1.39*  CALCIUM 8.7*  < > 8.6*  AST 28  --   --   ALT 12*  --   --   ALKPHOS 68  --   --   BILITOT 0.4  --   --   < > = values in this interval not displayed.  Cardiac Enzymes  Recent Labs Lab 02/20/15 0721  TROPONINI <0.03    Microbiology Results  Results for orders placed or performed during the hospital encounter of 02/19/15  Urine culture     Status: None   Collection Time: 02/19/15 10:05 PM  Result Value Ref Range Status   Specimen Description URINE, CLEAN CATCH  Final   Special Requests Normal  Final   Culture   Final    MULTIPLE SPECIES PRESENT, SUGGEST RECOLLECTION IF CLINICALLY INDICATED   Report Status 02/21/2015 FINAL  Final    RADIOLOGY:  Dg Chest Portable 1 View  02/19/2015   CLINICAL DATA:  Hypoglycemia.  Weakness.  Diabetes.  Breast cancer.  EXAM: PORTABLE CHEST - 1 VIEW   COMPARISON:  10/25/2014  FINDINGS: Dual lead pacer noted with lead positioning unchanged. Left axillary clips noted.  Mild enlargement of the cardiopericardial silhouette. Mildly tortuous thoracic aorta. The patient is rotated to the right on today's radiograph, reducing diagnostic sensitivity and specificity.  Left mastectomy.  Thoracic spondylosis.  IMPRESSION: 1. Mild enlargement of the cardiopericardial silhouette, without  edema. Dual lead pacer noted. 2. No acute findings.   Electronically Signed   By: Van Clines M.D.   On: 02/19/2015 21:01    EKG:   Orders placed or performed during the hospital encounter of 02/19/15  . ED EKG  . ED EKG  . Repeat EKG  . Repeat EKG  . EKG 12-Lead  . EKG 12-Lead  . EKG 12-Lead  . EKG 12-Lead      Management plans discussed with the patient, family and they are in agreement.  CODE STATUS:     Code Status Orders        Start     Ordered   02/20/15 0354  Full code   Continuous     02/20/15 0353      TOTAL TIME TAKING CARE OF THIS PATIENT: 45 minutes.    @[facility] @  on 02/21/2015 at 11:14 AM  Between 7am to 6pm - Pager - [phone]  After 6pm go to [email or web address] - password [email or web address] [facility]  Office  [phone]  CC: Primary care physician; Regina Coffin, MD

## 2015-02-21 NOTE — Progress Notes (Signed)
Pt reported feeling sweaty.  Blood sugar checked and 50.  Gave pt juice and pt requested ice cream and cereal as well.  Pt eating well at this time.  Blood sugar came up to 86.  Pt is continuing to eat food.  Discussed patient's eating options at home.  Pt states she doesn't have adequate transport to get food.  States that she eats frozen meals, but doesn't eat full meals at home either.  Patient doesn't seem to have an understanding of healthy diabetic and heart healthy choice options.  Making referral to case manager for services and dietician to help patient with diabetic choices.  Lynnda Shields, RN

## 2015-02-21 NOTE — Discharge Instructions (Signed)
You were seen today for low blood sugar and found to have a significant urinary tract infection.  We understand that you have a prescription for Cipro at home.  It is our recommendation, based on local bacteria and resistance patterns, that you take the prescribed cephalexin instead of the previously prescribed Cipro for your urinary tract infection.  Please take the full course of medication and follow up with her primary care doctor at the next available opportunity.  Be sure and eat a sufficient amount of food after taking her insulin so that your blood sugar does not drop too low.  Treating the infection should help significantly.  Return to the emergency department with new or worsening symptoms that concern you.   Hypoglycemia Hypoglycemia occurs when the glucose in your blood is too low. Glucose is a type of sugar that is your body's main energy source. Hormones, such as insulin and glucagon, control the level of glucose in the blood. Insulin lowers blood glucose and glucagon increases blood glucose. Having too much insulin in your blood stream, or not eating enough food containing sugar, can result in hypoglycemia. Hypoglycemia can happen to people with or without diabetes. It can develop quickly and can be a medical emergency.  CAUSES   Missing or delaying meals.  Not eating enough carbohydrates at meals.  Taking too much diabetes medicine.  Not timing your oral diabetes medicine or insulin doses with meals, snacks, and exercise.  Nausea and vomiting.  Certain medicines.  Severe illnesses, such as hepatitis, kidney disorders, and certain eating disorders.  Increased activity or exercise without eating something extra or adjusting medicines.  Drinking too much alcohol.  A nerve disorder that affects body functions like your heart rate, blood pressure, and digestion (autonomic neuropathy).  A condition where the stomach muscles do not function properly (gastroparesis). Therefore,  medicines and food may not absorb properly.  Rarely, a tumor of the pancreas can produce too much insulin. SYMPTOMS   Hunger.  Sweating (diaphoresis).  Change in body temperature.  Shakiness.  Headache.  Anxiety.  Lightheadedness.  Irritability.  Difficulty concentrating.  Dry mouth.  Tingling or numbness in the hands or feet.  Restless sleep or sleep disturbances.  Altered speech and coordination.  Change in mental status.  Seizures or prolonged convulsions.  Combativeness.  Drowsiness (lethargic).  Weakness.  Increased heart rate or palpitations.  Confusion.  Pale, gray skin color.  Blurred or double vision.  Fainting. DIAGNOSIS  A physical exam and medical history will be performed. Your caregiver may make a diagnosis based on your symptoms. Blood tests and other lab tests may be performed to confirm a diagnosis. Once the diagnosis is made, your caregiver will see if your signs and symptoms go away once your blood glucose is raised.  TREATMENT  Usually, you can easily treat your hypoglycemia when you notice symptoms.  Check your blood glucose. If it is less than 70 mg/dl, take one of the following:   3-4 glucose tablets.    cup juice.    cup regular soda.   1 cup skim milk.   -1 tube of glucose gel.   5-6 hard candies.   Avoid high-fat drinks or food that may delay a rise in blood glucose levels.  Do not take more than the recommended amount of sugary foods, drinks, gel, or tablets. Doing so will cause your blood glucose to go too high.   Wait 10-15 minutes and recheck your blood glucose. If it is still less than  70 mg/dl or below your target range, repeat treatment.   Eat a snack if it is more than 1 hour until your next meal.  There may be a time when your blood glucose may go so low that you are unable to treat yourself at home when you start to notice symptoms. You may need someone to help you. You may even faint or be  unable to swallow. If you cannot treat yourself, someone will need to bring you to the hospital.  [place]  If you have diabetes, follow your diabetes management plan by:  Taking your medicines as directed.  Following your exercise plan.  Following your meal plan. Do not skip meals. Eat on time.  Testing your blood glucose regularly. Check your blood glucose before and after exercise. If you exercise longer or different than usual, be sure to check blood glucose more frequently.  Wearing your medical alert jewelry that says you have diabetes.  Identify the cause of your hypoglycemia. Then, develop ways to prevent the recurrence of hypoglycemia.  Do not take a hot bath or shower right after an insulin shot.  Always carry treatment with you. Glucose tablets are the easiest to carry.  If you are going to drink alcohol, drink it only with meals.  Tell friends or family members ways to keep you safe during a seizure. This may include removing hard or sharp objects from the area or turning you on your side.  Maintain a healthy weight. SEEK MEDICAL CARE IF:   You are having problems keeping your blood glucose in your target range.  You are having frequent episodes of hypoglycemia.  You feel you might be having side effects from your medicines.  You are not sure why your blood glucose is dropping so low.  You notice a change in vision or a new problem with your vision. SEEK IMMEDIATE MEDICAL CARE IF:   Confusion develops.  A change in mental status occurs.  The inability to swallow develops.  Fainting occurs. Document Released: 07/29/2005 Document Revised: 08/03/2013 Document Reviewed: 11/25/2011 [facility] Patient Information 2015 [place], [place]. This information is not intended to replace advice given to you by your health care provider. Make sure you discuss any questions you have with your health care provider.  Urinary Tract Infection Urinary tract  infections (UTIs) can develop anywhere along your urinary tract. Your urinary tract is your body's drainage system for removing wastes and extra water. Your urinary tract includes two kidneys, two ureters, a bladder, and a urethra. Your kidneys are a pair of bean-shaped organs. Each kidney is about the size of your fist. They are located below your ribs, one on each side of your spine. CAUSES Infections are caused by microbes, which are microscopic organisms, including fungi, viruses, and bacteria. These organisms are so small that they can only be seen through a microscope. Bacteria are the microbes that most commonly cause UTIs. SYMPTOMS  Symptoms of UTIs may vary by age and gender of the patient and by the location of the infection. Symptoms in young women typically include a frequent and intense urge to urinate and a painful, burning feeling in the bladder or urethra during urination. Older women and men are more likely to be tired, shaky, and weak and have muscle aches and abdominal pain. A fever may mean the infection is in your kidneys. Other symptoms of a kidney infection include pain in your back or sides below the ribs, nausea, and vomiting. DIAGNOSIS To diagnose a  UTI, your caregiver will ask you about your symptoms. Your caregiver also will ask to provide a urine sample. The urine sample will be tested for bacteria and white blood cells. White blood cells are made by your body to help fight infection. TREATMENT  Typically, UTIs can be treated with medication. Because most UTIs are caused by a bacterial infection, they usually can be treated with the use of antibiotics. The choice of antibiotic and length of treatment depend on your symptoms and the type of bacteria causing your infection. HOME CARE INSTRUCTIONS  If you were prescribed antibiotics, take them exactly as your caregiver instructs you. Finish the medication even if you feel better after you have only taken some of the  medication.  Drink enough water and fluids to keep your urine clear or pale yellow.  Avoid caffeine, tea, and carbonated beverages. They tend to irritate your bladder.  Empty your bladder often. Avoid holding urine for long periods of time.  Empty your bladder before and after sexual intercourse.  After a bowel movement, women should cleanse from front to back. Use each tissue only once. SEEK MEDICAL CARE IF:   You have back pain.  You develop a fever.  Your symptoms do not begin to resolve within 3 days. SEEK IMMEDIATE MEDICAL CARE IF:   You have severe back pain or lower abdominal pain.  You develop chills.  You have nausea or vomiting.  You have continued burning or discomfort with urination. MAKE SURE YOU:   Understand these instructions.  Will watch your condition.  Will get help right away if you are not doing well or get worse. Document Released: 05/08/2005 Document Revised: 01/28/2012 Document Reviewed: 09/06/2011 [facility] Patient Information 2015 [place], [place]. This information is not intended to replace advice given to you by your health care provider. Make sure you discuss any questions you have with your health care provider.   Activity as tolerated Diet diabetic and healthy heart Follow-up with primary care physician in a week. PCP to consider repeat PT/INR in a.m.-July 13 for Coumadin dosing Follow-up with cardiology in 2 weeks

## 2015-02-21 NOTE — Progress Notes (Signed)
Inpatient Diabetes Program Recommendations  AACE/ADA: New Consensus Statement on Inpatient Glycemic Control (2013)  Target Ranges:  Prepandial:   less than 140 mg/dL      Peak postprandial:   less than 180 mg/dL (1-2 hours)      Critically ill patients:  140 - 180 mg/dL   Results for NIMA, BAMBURG (MRN [id number]) as of 02/21/2015 08:50  Ref. Range 02/20/2015 16:43 02/20/2015 23:51 02/21/2015 00:06 02/21/2015 00:20 02/21/2015 05:25  Glucose-Capillary Latest Ref Range: 65-99 mg/dL 251 (H) 50 (L) 58 (L) 83 138 (H)   Patient takes Humalog 70/30  56 units daily as an outpatient.   Hypoglycemia this am - please consider changing insulin to Novolog 75/25 to 28 units bid, pre-breakfast and pre-supper  HgbA1C: A1C 6.6%  Gentry Fitz, RN, [place], [place], CDE Diabetes Coordinator Inpatient Diabetes Program  [phone] (Team Pager) [phone] ([place]) 02/21/2015 8:55 AM

## 2016-05-02 ENCOUNTER — Other Ambulatory Visit: Payer: Self-pay | Admitting: Otolaryngology

## 2016-05-02 DIAGNOSIS — R1310 Dysphagia, unspecified: Secondary | ICD-10-CM

## 2016-05-02 DIAGNOSIS — R221 Localized swelling, mass and lump, neck: Secondary | ICD-10-CM

## 2016-05-08 ENCOUNTER — Ambulatory Visit
Admission: RE | Admit: 2016-05-08 | Discharge: 2016-05-08 | Disposition: A | Discharge status: Home / Self Care | Payer: Medicare Other | Source: Ambulatory Visit | Attending: Otolaryngology | Admitting: Otolaryngology

## 2016-05-08 DIAGNOSIS — I6523 Occlusion and stenosis of bilateral carotid arteries: Secondary | ICD-10-CM | POA: Insufficient documentation

## 2016-05-08 DIAGNOSIS — R221 Localized swelling, mass and lump, neck: Secondary | ICD-10-CM

## 2016-05-08 DIAGNOSIS — F458 Other somatoform disorders: Secondary | ICD-10-CM | POA: Diagnosis present

## 2016-05-08 DIAGNOSIS — R1310 Dysphagia, unspecified: Secondary | ICD-10-CM | POA: Diagnosis not present

## 2016-05-08 HISTORY — DX: Malignant neoplasm of unspecified site of left female breast: C50.912

## 2016-05-08 LAB — POCT I-STAT CREATININE: CREATININE: 1.4 mg/dL — AB (ref 0.44–1.00)

## 2016-05-08 MED ORDER — IOPAMIDOL (ISOVUE-300) INJECTION 61%
60.0000 mL | Freq: Once | INTRAVENOUS | Status: AC | PRN
  Administered 2016-05-08: 60 mL via INTRAVENOUS

## 2016-06-12 ENCOUNTER — Other Ambulatory Visit: Payer: Self-pay | Admitting: Family Medicine

## 2016-06-12 ENCOUNTER — Ambulatory Visit
Admission: RE | Admit: 2016-06-12 | Discharge: 2016-06-12 | Disposition: A | Discharge status: Home / Self Care | Payer: Medicare Other | Source: Ambulatory Visit | Attending: Family Medicine | Admitting: Family Medicine

## 2016-06-12 DIAGNOSIS — M1288 Other specific arthropathies, not elsewhere classified, other specified site: Secondary | ICD-10-CM | POA: Insufficient documentation

## 2016-06-12 DIAGNOSIS — I700 Atherosclerosis of aorta: Secondary | ICD-10-CM | POA: Insufficient documentation

## 2016-06-12 DIAGNOSIS — M549 Dorsalgia, unspecified: Secondary | ICD-10-CM | POA: Diagnosis not present

## 2016-06-12 DIAGNOSIS — R52 Pain, unspecified: Secondary | ICD-10-CM

## 2016-06-20 ENCOUNTER — Emergency Department: Payer: Medicare Other

## 2016-06-20 ENCOUNTER — Emergency Department
Admission: EM | Admit: 2016-06-20 | Discharge: 2016-06-20 | Disposition: A | Discharge status: Home / Self Care | Payer: Medicare Other | Attending: Emergency Medicine | Admitting: Emergency Medicine

## 2016-06-20 ENCOUNTER — Encounter: Payer: Self-pay | Admitting: *Deleted

## 2016-06-20 DIAGNOSIS — Z792 Long term (current) use of antibiotics: Secondary | ICD-10-CM | POA: Insufficient documentation

## 2016-06-20 DIAGNOSIS — E119 Type 2 diabetes mellitus without complications: Secondary | ICD-10-CM | POA: Insufficient documentation

## 2016-06-20 DIAGNOSIS — M79651 Pain in right thigh: Secondary | ICD-10-CM | POA: Diagnosis present

## 2016-06-20 DIAGNOSIS — E039 Hypothyroidism, unspecified: Secondary | ICD-10-CM | POA: Diagnosis not present

## 2016-06-20 DIAGNOSIS — Z79899 Other long term (current) drug therapy: Secondary | ICD-10-CM | POA: Insufficient documentation

## 2016-06-20 DIAGNOSIS — I10 Essential (primary) hypertension: Secondary | ICD-10-CM | POA: Diagnosis not present

## 2016-06-20 DIAGNOSIS — Z791 Long term (current) use of non-steroidal anti-inflammatories (NSAID): Secondary | ICD-10-CM | POA: Diagnosis not present

## 2016-06-20 DIAGNOSIS — I2510 Atherosclerotic heart disease of native coronary artery without angina pectoris: Secondary | ICD-10-CM | POA: Insufficient documentation

## 2016-06-20 DIAGNOSIS — Z7901 Long term (current) use of anticoagulants: Secondary | ICD-10-CM | POA: Insufficient documentation

## 2016-06-20 DIAGNOSIS — M79604 Pain in right leg: Secondary | ICD-10-CM

## 2016-06-20 DIAGNOSIS — R791 Abnormal coagulation profile: Secondary | ICD-10-CM

## 2016-06-20 DIAGNOSIS — Z794 Long term (current) use of insulin: Secondary | ICD-10-CM | POA: Diagnosis not present

## 2016-06-20 LAB — CBC
HEMATOCRIT: 25.3 % — AB (ref 35.0–47.0)
HEMOGLOBIN: 8.8 g/dL — AB (ref 12.0–16.0)
MCH: 30.7 pg (ref 26.0–34.0)
MCHC: 34.7 g/dL (ref 32.0–36.0)
MCV: 88.4 fL (ref 80.0–100.0)
Platelets: 273 10*3/uL (ref 150–440)
RBC: 2.87 MIL/uL — ABNORMAL LOW (ref 3.80–5.20)
RDW: 14.6 % — AB (ref 11.5–14.5)
WBC: 10.3 10*3/uL (ref 3.6–11.0)

## 2016-06-20 LAB — PROTIME-INR: INR: >11

## 2016-06-20 LAB — COMPREHENSIVE METABOLIC PANEL
ALK PHOS: 61 U/L (ref 38–126)
ALT: 15 U/L (ref 14–54)
ANION GAP: 6 (ref 5–15)
AST: 33 U/L (ref 15–41)
Albumin: 3.2 g/dL — ABNORMAL LOW (ref 3.5–5.0)
BILIRUBIN TOTAL: 0.7 mg/dL (ref 0.3–1.2)
BUN: 30 mg/dL — ABNORMAL HIGH (ref 6–20)
CALCIUM: 8.4 mg/dL — AB (ref 8.9–10.3)
CO2: 25 mmol/L (ref 22–32)
Chloride: 99 mmol/L — ABNORMAL LOW (ref 101–111)
Creatinine, Ser: 1.56 mg/dL — ABNORMAL HIGH (ref 0.44–1.00)
GFR, EST AFRICAN AMERICAN: 35 mL/min — AB (ref >60)
GFR, EST NON AFRICAN AMERICAN: 30 mL/min — AB (ref >60)
Glucose, Bld: 369 mg/dL — ABNORMAL HIGH (ref 65–99)
POTASSIUM: 4.7 mmol/L (ref 3.5–5.1)
Sodium: 130 mmol/L — ABNORMAL LOW (ref 135–145)
TOTAL PROTEIN: 6.4 g/dL — AB (ref 6.5–8.1)

## 2016-06-20 MED ORDER — PHYTONADIONE 5 MG PO TABS
5.0000 mg | ORAL_TABLET | Freq: Once | ORAL | Status: AC
  Administered 2016-06-20: 5 mg via ORAL
  Filled 2016-06-20: qty 1

## 2016-06-20 NOTE — Discharge Instructions (Signed)
Your workup today in the emergency department has shown a very high Coumadin level. You have been given 5 mg of vitamin K in the emergency department. As we discussed please do not take Coumadin tomorrow or Saturday. You may restart Sunday morning however at half dose (2.5 mg). Please follow-up with her primary care doctor on Monday for recheck of your INR (Coumadin level), today's value is 12.5.Marland Kitchen

## 2016-06-20 NOTE — ED Notes (Addendum)
Pt a/o. Pt with right leg pain. When right is compared to left legs are similar. Pt denies injury. Able to bare weight. +2 pulses. Both legs edematous. Skin intact

## 2016-06-20 NOTE — ED Notes (Signed)
Dr. Kerman Passey notified of PT 99.5 and INR of 12.49

## 2016-06-20 NOTE — ED Notes (Signed)
Pt. Verbalizes understanding of d/c instructions and follow-up. VS stable and pain controlled per pt.  Pt. In NAD at time of d/c and denies further concerns regarding this visit. Pt. Stable at the time of departure from the unit, departing unit by the safest and most appropriate manner per that pt condition and limitations. Pt advised to return to the ED at any time for emergent concerns, or for new/worsening symptoms.   

## 2016-06-20 NOTE — ED Provider Notes (Signed)
[facility] Emergency Department Provider Note  Time seen: 4:49 PM  I have reviewed the triage vital signs and the nursing notes.   HISTORY  Chief Complaint Leg Pain    HPI Regina [name] is a 82 y.o. female on Coumadin who presents the emergency department with right thigh pain. According to the patient for the past several weeks she has been experiencing pain in the right lateral thigh. Patient states she saw her doctor who took x-rays that were negative. States she continues to have discomfort so she came to the emergency department today for evaluation. Patient has noted several bruises to the right thigh as well. Of note the patient has a very large bruise to the left forearm which she states occurred 2 weeks ago but denies any traumatic event. Denies any other bleeding such as rectal bleeding or bleeding of the gums. Patient denies any trauma to the right leg. Patient is minimally ambulatory at baseline.  Past Medical History:  Diagnosis Date  . Breast cancer, left [facility]) 2013   Mastectomy.   . CAD (coronary artery disease)    s/p cath in 2009 with 2 stents  . Diabetes mellitus without complication ([place])   . GERD (gastroesophageal reflux disease)   . HLD (hyperlipidemia)   . Hypertension   . Hypothyroidism   . Paroxysmal atrial fibrillation [facility])     Patient Active Problem List   Diagnosis Date Noted  . UTI (lower urinary tract infection) 02/20/2015  . Elevated troponin 02/20/2015  . Hypoglycemia 02/20/2015  . Type 2 diabetes mellitus ([place]) 02/20/2015  . HTN (hypertension) 02/20/2015  . HLD (hyperlipidemia) 02/20/2015  . Paroxysmal atrial fibrillation ([place]) 02/20/2015  . GERD (gastroesophageal reflux disease) 02/20/2015    Past Surgical History:  Procedure Laterality Date  . BREAST LUMPECTOMY    . CHOLECYSTECTOMY    . MASTECTOMY Left     Prior to Admission medications   Medication Sig Start Date End Date Taking? Authorizing Provider   acetaminophen (TYLENOL) 325 MG tablet Take 2 tablets (650 mg total) by mouth every 6 (six) hours as needed for mild pain (or Fever >/= 101). 02/21/15   Nicholes Mango, MD  bumetanide (BUMEX) 2 MG tablet Take 2 mg by mouth daily.    Historical Provider, MD  cephALEXin (KEFLEX) 500 MG capsule Take 1 capsule (500 mg total) by mouth 3 (three) times daily. 02/19/15   Hinda Kehr, MD  clopidogrel (PLAVIX) 75 MG tablet Take 75 mg by mouth daily.    Historical Provider, MD  esomeprazole (NEXIUM) 40 MG capsule Take 40 mg by mouth daily.     Historical Provider, MD  insulin aspart protamine- aspart (NOVOLOG MIX 70/30) (70-30) 100 UNIT/ML injection Inject 0.28 mLs (28 Units total) into the skin 2 (two) times daily with a meal. 02/21/15   Nicholes Mango, MD  levothyroxine (SYNTHROID, LEVOTHROID) 50 MCG tablet Take 50 mcg by mouth daily.    Historical Provider, MD  lisinopril (PRINIVIL,ZESTRIL) 20 MG tablet Take 20 mg by mouth daily.    Historical Provider, MD  metoprolol succinate (TOPROL-XL) 50 MG 24 hr tablet Take 50 mg by mouth 2 (two) times daily.    Historical Provider, MD  paricalcitol (ZEMPLAR) 1 MCG capsule Take 1 mcg by mouth 3 (three) times a week. Pt takes on Monday, Wednesday, and Friday.    Historical Provider, MD  ranolazine (RANEXA) 500 MG 12 hr tablet Take 500 mg by mouth 2 (two) times daily.    Historical Provider, MD  rosuvastatin (CRESTOR) 10 MG tablet Take 10 mg by mouth daily.    Historical Provider, MD  warfarin (COUMADIN) 2.5 MG tablet Take 2.5 mg by mouth daily.    Historical Provider, MD    No Known Allergies  Family History  Problem Relation Age of Onset  . Kidney failure Mother   . Lung cancer Father     Social History Social History  Substance Use Topics  . Smoking status: Never Smoker  . Smokeless tobacco: Never Used  . Alcohol use No    Review of Systems Constitutional: Negative for fever. Cardiovascular: Negative for chest pain. Respiratory: Negative for shortness of  breath. Gastrointestinal: Negative for abdominal pain Musculoskeletal: Right leg/thigh pain Neurological: Negative for headache 10-point ROS otherwise negative.  ____________________________________________   PHYSICAL EXAM:  VITAL SIGNS: ED Triage Vitals  Enc Vitals Group     BP 06/20/16 1515 (!) 142/41     Pulse Rate 06/20/16 1510 79     Resp 06/20/16 1510 20     Temp 06/20/16 1510 98.3 F (36.8 C)     Temp Source 06/20/16 1510 Oral     SpO2 06/20/16 1510 100 %     Weight 06/20/16 1513 235 lb (106.6 kg)     Height 06/20/16 1513 4\' 11"  (1.499 m)     Head Circumference --      Peak Flow --      Pain Score 06/20/16 1513 10     Pain Loc --      Pain Edu? --      Excl. in [place]? --     Constitutional: Alert and oriented. Well appearing and in no distress. Eyes: Normal exam ENT   Head: Normocephalic and atraumatic.   Mouth/Throat: Mucous membranes are moist. Cardiovascular: Normal rate, regular rhythm. No murmur Respiratory: Normal respiratory effort without tachypnea nor retractions. Breath sounds are clear  Gastrointestinal: Soft and nontender. No distention.  Musculoskeletal: Moderate tenderness palpation of the right lateral thigh. 2 small bruises approximately 3-4 cm in diameter to the right lateral thigh. Soft compartments, 1+ DP pulse but equal bilaterally, warm extremity with nontender. No Significant Lower Extremity Edema. Neurologic:  Normal speech and language. No gross focal neurologic deficits Skin:  Skin is warm, dry and intact.  Psychiatric: Mood and affect are norma  ____________________________________________     RADIOLOGY  Ultrasound negative for DVT.  ____________________________________________   INITIAL IMPRESSION / ASSESSMENT AND PLAN / ED COURSE  Pertinent labs & imaging results that were available during my care of the patient were reviewed by me and considered in my medical decision making (see chart for details).  Patient presents  for 2 weeks of right thigh pain. Patient does have 2 small bruises to the right thigh. Moderate tenderness palpation of the right lateral thigh, no tenderness to the medial thigh, no calf tenderness no significant lower extremity edema and pulses are intact. Patient states x-rays performed last week which were normal besides arthritis. Ultrasound today is negative for DVT. However the patient does have a very large contusion to the left former she states is about 82 weeks old, she states she is recent started on Coumadin. We will check labs including an INR to further evaluate. Patient discomfort could simply be from contusion/bruising or possibly a hidden small hematoma.  Patient's INR has resulted at 12.5. Blood glucose is slightly elevated the patient has not taken her nighttime medications. We will dose 5 mg of vitamin K orally. I have instructed the patient to discontinue  her Coumadin tomorrow and the following [place]. She will restart Sunday morning at half dose 2.5 mg. She sees her primary care doctor on Monday for recheck of her INR. Patient is agreeable to this plan. I discussed with the patient the need to be extremely careful of the next few days avoiding anything that would put her at risk of falling. Patient is agreeable.  ____________________________________________   FINAL CLINICAL IMPRESSION(S) / ED DIAGNOSES  Musculoskeletal pain Supratherapeutic INR   Harvest Dark, MD 06/20/16 1901

## 2016-06-20 NOTE — ED Triage Notes (Addendum)
Pt to triage via wheelchair.  Pt reports she has pain in right upper leg radiating into right lower leg.  Pt had xrays last week and was dx with arthritis.  Pt taking tylenol and percocet for pain.  No known injury.  Pt alert.  Speech clear.

## 2016-06-26 ENCOUNTER — Inpatient Hospital Stay
Admission: EM | Admit: 2016-06-26 | Discharge: 2016-07-01 | DRG: 603 | Disposition: A | Discharge status: Skilled Nursing Facility | Payer: Medicare Other | Attending: Internal Medicine | Admitting: Internal Medicine

## 2016-06-26 ENCOUNTER — Encounter: Payer: Self-pay | Admitting: Emergency Medicine

## 2016-06-26 DIAGNOSIS — E119 Type 2 diabetes mellitus without complications: Secondary | ICD-10-CM

## 2016-06-26 DIAGNOSIS — N184 Chronic kidney disease, stage 4 (severe): Secondary | ICD-10-CM | POA: Diagnosis present

## 2016-06-26 DIAGNOSIS — E875 Hyperkalemia: Secondary | ICD-10-CM | POA: Diagnosis present

## 2016-06-26 DIAGNOSIS — R262 Difficulty in walking, not elsewhere classified: Secondary | ICD-10-CM

## 2016-06-26 DIAGNOSIS — Z794 Long term (current) use of insulin: Secondary | ICD-10-CM

## 2016-06-26 DIAGNOSIS — M6281 Muscle weakness (generalized): Secondary | ICD-10-CM

## 2016-06-26 DIAGNOSIS — I480 Paroxysmal atrial fibrillation: Secondary | ICD-10-CM | POA: Diagnosis present

## 2016-06-26 DIAGNOSIS — I10 Essential (primary) hypertension: Secondary | ICD-10-CM | POA: Diagnosis present

## 2016-06-26 DIAGNOSIS — R0600 Dyspnea, unspecified: Secondary | ICD-10-CM

## 2016-06-26 DIAGNOSIS — E785 Hyperlipidemia, unspecified: Secondary | ICD-10-CM | POA: Diagnosis present

## 2016-06-26 DIAGNOSIS — Z955 Presence of coronary angioplasty implant and graft: Secondary | ICD-10-CM

## 2016-06-26 DIAGNOSIS — Z841 Family history of disorders of kidney and ureter: Secondary | ICD-10-CM

## 2016-06-26 DIAGNOSIS — Z79899 Other long term (current) drug therapy: Secondary | ICD-10-CM

## 2016-06-26 DIAGNOSIS — Z7901 Long term (current) use of anticoagulants: Secondary | ICD-10-CM

## 2016-06-26 DIAGNOSIS — L03115 Cellulitis of right lower limb: Secondary | ICD-10-CM | POA: Diagnosis not present

## 2016-06-26 DIAGNOSIS — K219 Gastro-esophageal reflux disease without esophagitis: Secondary | ICD-10-CM | POA: Diagnosis present

## 2016-06-26 DIAGNOSIS — T148XXA Other injury of unspecified body region, initial encounter: Secondary | ICD-10-CM

## 2016-06-26 DIAGNOSIS — Z9012 Acquired absence of left breast and nipple: Secondary | ICD-10-CM

## 2016-06-26 DIAGNOSIS — Z853 Personal history of malignant neoplasm of breast: Secondary | ICD-10-CM

## 2016-06-26 DIAGNOSIS — E1122 Type 2 diabetes mellitus with diabetic chronic kidney disease: Secondary | ICD-10-CM | POA: Diagnosis present

## 2016-06-26 DIAGNOSIS — E1159 Type 2 diabetes mellitus with other circulatory complications: Secondary | ICD-10-CM | POA: Diagnosis present

## 2016-06-26 DIAGNOSIS — N179 Acute kidney failure, unspecified: Secondary | ICD-10-CM | POA: Diagnosis present

## 2016-06-26 DIAGNOSIS — I152 Hypertension secondary to endocrine disorders: Secondary | ICD-10-CM | POA: Diagnosis present

## 2016-06-26 DIAGNOSIS — M79604 Pain in right leg: Secondary | ICD-10-CM

## 2016-06-26 DIAGNOSIS — E039 Hypothyroidism, unspecified: Secondary | ICD-10-CM | POA: Diagnosis present

## 2016-06-26 DIAGNOSIS — I129 Hypertensive chronic kidney disease with stage 1 through stage 4 chronic kidney disease, or unspecified chronic kidney disease: Secondary | ICD-10-CM | POA: Diagnosis present

## 2016-06-26 DIAGNOSIS — Z7902 Long term (current) use of antithrombotics/antiplatelets: Secondary | ICD-10-CM

## 2016-06-26 DIAGNOSIS — I2510 Atherosclerotic heart disease of native coronary artery without angina pectoris: Secondary | ICD-10-CM | POA: Diagnosis present

## 2016-06-26 DIAGNOSIS — L0390 Cellulitis, unspecified: Secondary | ICD-10-CM | POA: Diagnosis present

## 2016-06-26 LAB — CBC WITH DIFFERENTIAL/PLATELET
BASOS PCT: 0 %
Basophils Absolute: 0 10*3/uL (ref 0–0.1)
EOS ABS: 0 10*3/uL (ref 0–0.7)
EOS PCT: 0 %
HCT: 24.9 % — ABNORMAL LOW (ref 35.0–47.0)
Hemoglobin: 8.1 g/dL — ABNORMAL LOW (ref 12.0–16.0)
Lymphocytes Relative: 5 %
Lymphs Abs: 0.5 10*3/uL — ABNORMAL LOW (ref 1.0–3.6)
MCH: 29.6 pg (ref 26.0–34.0)
MCHC: 32.4 g/dL (ref 32.0–36.0)
MCV: 91.5 fL (ref 80.0–100.0)
MONO ABS: 1.1 10*3/uL — AB (ref 0.2–0.9)
MONOS PCT: 10 %
Neutro Abs: 9.4 10*3/uL — ABNORMAL HIGH (ref 1.4–6.5)
Neutrophils Relative %: 85 %
Platelets: 322 10*3/uL (ref 150–440)
RBC: 2.72 MIL/uL — ABNORMAL LOW (ref 3.80–5.20)
RDW: 15.2 % — AB (ref 11.5–14.5)
WBC: 11.1 10*3/uL — ABNORMAL HIGH (ref 3.6–11.0)

## 2016-06-26 LAB — BASIC METABOLIC PANEL
Anion gap: 8 (ref 5–15)
BUN: 38 mg/dL — ABNORMAL HIGH (ref 6–20)
CALCIUM: 8.1 mg/dL — AB (ref 8.9–10.3)
CO2: 23 mmol/L (ref 22–32)
CREATININE: 1.39 mg/dL — AB (ref 0.44–1.00)
Chloride: 101 mmol/L (ref 101–111)
GFR calc non Af Amer: 35 mL/min — ABNORMAL LOW (ref >60)
GFR, EST AFRICAN AMERICAN: 40 mL/min — AB (ref >60)
Glucose, Bld: 214 mg/dL — ABNORMAL HIGH (ref 65–99)
Potassium: 4.7 mmol/L (ref 3.5–5.1)
SODIUM: 132 mmol/L — AB (ref 135–145)

## 2016-06-26 LAB — PROTIME-INR
INR: 2.64
PROTHROMBIN TIME: 28.7 s — AB (ref 11.4–15.2)

## 2016-06-26 LAB — LACTIC ACID, PLASMA: Lactic Acid, Venous: 2.4 mmol/L (ref 0.5–1.9)

## 2016-06-26 MED ORDER — INSULIN ASPART 100 UNIT/ML ~~LOC~~ SOLN
0.0000 [IU] | Freq: Three times a day (TID) | SUBCUTANEOUS | Status: DC
  Administered 2016-06-27 (×3): 3 [IU] via SUBCUTANEOUS
  Administered 2016-06-28: 2 [IU] via SUBCUTANEOUS
  Administered 2016-06-28 (×2): 5 [IU] via SUBCUTANEOUS
  Administered 2016-06-29: 3 [IU] via SUBCUTANEOUS
  Administered 2016-06-29 (×2): 2 [IU] via SUBCUTANEOUS
  Administered 2016-06-30 (×2): 3 [IU] via SUBCUTANEOUS
  Administered 2016-06-30: 5 [IU] via SUBCUTANEOUS
  Administered 2016-07-01: 7 [IU] via SUBCUTANEOUS
  Administered 2016-07-01: 5 [IU] via SUBCUTANEOUS
  Filled 2016-06-26: qty 3
  Filled 2016-06-26: qty 7
  Filled 2016-06-26: qty 3
  Filled 2016-06-26: qty 2
  Filled 2016-06-26: qty 5
  Filled 2016-06-26: qty 3
  Filled 2016-06-26: qty 2
  Filled 2016-06-26: qty 3
  Filled 2016-06-26: qty 2
  Filled 2016-06-26: qty 5
  Filled 2016-06-26: qty 3
  Filled 2016-06-26 (×2): qty 5
  Filled 2016-06-26: qty 3

## 2016-06-26 MED ORDER — ONDANSETRON HCL 4 MG/2ML IJ SOLN
4.0000 mg | Freq: Four times a day (QID) | INTRAMUSCULAR | Status: DC | PRN
  Administered 2016-06-27 – 2016-07-01 (×6): 4 mg via INTRAVENOUS
  Filled 2016-06-26 (×7): qty 2

## 2016-06-26 MED ORDER — SODIUM CHLORIDE 0.9 % IV SOLN
INTRAVENOUS | Status: AC
  Administered 2016-06-26 – 2016-06-27 (×2): via INTRAVENOUS

## 2016-06-26 MED ORDER — RANOLAZINE ER 500 MG PO TB12
500.0000 mg | ORAL_TABLET | Freq: Two times a day (BID) | ORAL | Status: DC
  Administered 2016-06-26 – 2016-06-29 (×6): 500 mg via ORAL
  Filled 2016-06-26 (×6): qty 1

## 2016-06-26 MED ORDER — WARFARIN SODIUM 5 MG PO TABS
2.5000 mg | ORAL_TABLET | Freq: Every day | ORAL | Status: DC
  Filled 2016-06-26: qty 1

## 2016-06-26 MED ORDER — METOPROLOL SUCCINATE ER 50 MG PO TB24
50.0000 mg | ORAL_TABLET | Freq: Two times a day (BID) | ORAL | Status: DC
  Administered 2016-06-27: 50 mg via ORAL
  Filled 2016-06-26 (×2): qty 1

## 2016-06-26 MED ORDER — CLOPIDOGREL BISULFATE 75 MG PO TABS
75.0000 mg | ORAL_TABLET | Freq: Every day | ORAL | Status: DC
  Administered 2016-06-27 – 2016-06-28 (×2): 75 mg via ORAL
  Filled 2016-06-26 (×2): qty 1

## 2016-06-26 MED ORDER — LISINOPRIL 20 MG PO TABS
20.0000 mg | ORAL_TABLET | Freq: Every day | ORAL | Status: DC
  Administered 2016-06-27: 20 mg via ORAL
  Filled 2016-06-26 (×2): qty 1

## 2016-06-26 MED ORDER — BUMETANIDE 1 MG PO TABS
2.0000 mg | ORAL_TABLET | Freq: Every day | ORAL | Status: DC
  Administered 2016-06-27: 2 mg via ORAL
  Filled 2016-06-26: qty 2

## 2016-06-26 MED ORDER — ONDANSETRON HCL 4 MG PO TABS: 4.0000 mg | ORAL_TABLET | Freq: Four times a day (QID) | ORAL | Status: DC | PRN

## 2016-06-26 MED ORDER — MORPHINE SULFATE (PF) 4 MG/ML IV SOLN: 4.0000 mg | Freq: Once | INTRAVENOUS | Status: DC

## 2016-06-26 MED ORDER — CEFAZOLIN IN D5W 1 GM/50ML IV SOLN
1.0000 g | Freq: Once | INTRAVENOUS | Status: AC
  Administered 2016-06-26: 1 g via INTRAVENOUS
  Filled 2016-06-26: qty 50

## 2016-06-26 MED ORDER — MORPHINE SULFATE (PF) 4 MG/ML IV SOLN
4.0000 mg | Freq: Once | INTRAVENOUS | Status: AC
  Administered 2016-06-26: 4 mg via INTRAVENOUS

## 2016-06-26 MED ORDER — SODIUM CHLORIDE 0.9 % IV BOLUS (SEPSIS)
1000.0000 mL | Freq: Once | INTRAVENOUS | Status: AC
  Administered 2016-06-26: 1000 mL via INTRAVENOUS

## 2016-06-26 MED ORDER — MORPHINE SULFATE (PF) 4 MG/ML IV SOLN
4.0000 mg | Freq: Once | INTRAVENOUS | Status: DC
  Filled 2016-06-26: qty 1

## 2016-06-26 MED ORDER — INSULIN ASPART 100 UNIT/ML ~~LOC~~ SOLN
0.0000 [IU] | Freq: Every day | SUBCUTANEOUS | Status: DC
  Administered 2016-06-27 – 2016-06-28 (×2): 2 [IU] via SUBCUTANEOUS
  Administered 2016-06-30: 3 [IU] via SUBCUTANEOUS
  Filled 2016-06-26: qty 2
  Filled 2016-06-26: qty 3
  Filled 2016-06-26: qty 2

## 2016-06-26 MED ORDER — ACETAMINOPHEN 650 MG RE SUPP: 650.0000 mg | Freq: Four times a day (QID) | RECTAL | Status: DC | PRN

## 2016-06-26 MED ORDER — LEVOTHYROXINE SODIUM 50 MCG PO TABS
50.0000 ug | ORAL_TABLET | Freq: Every day | ORAL | Status: DC
  Administered 2016-06-27 – 2016-07-01 (×5): 50 ug via ORAL
  Filled 2016-06-26 (×5): qty 1

## 2016-06-26 MED ORDER — ACETAMINOPHEN 325 MG PO TABS
650.0000 mg | ORAL_TABLET | Freq: Four times a day (QID) | ORAL | Status: DC | PRN
  Filled 2016-06-26: qty 2

## 2016-06-26 MED ORDER — PANTOPRAZOLE SODIUM 40 MG PO TBEC
40.0000 mg | DELAYED_RELEASE_TABLET | Freq: Every day | ORAL | Status: DC
  Administered 2016-06-27 – 2016-06-30 (×4): 40 mg via ORAL
  Filled 2016-06-26 (×4): qty 1

## 2016-06-26 MED ORDER — SODIUM CHLORIDE 0.9% FLUSH
3.0000 mL | Freq: Two times a day (BID) | INTRAVENOUS | Status: DC
  Administered 2016-06-26 – 2016-07-01 (×9): 3 mL via INTRAVENOUS

## 2016-06-26 MED ORDER — HYDROCODONE-ACETAMINOPHEN 5-325 MG PO TABS
1.0000 | ORAL_TABLET | ORAL | Status: DC | PRN
Start: 2016-06-26 — End: 2016-07-01
  Administered 2016-06-26: 1 via ORAL
  Administered 2016-06-27: 2 via ORAL
  Administered 2016-06-27 (×3): 1 via ORAL
  Administered 2016-06-28 – 2016-06-29 (×4): 2 via ORAL
  Administered 2016-06-29 (×2): 1 via ORAL
  Administered 2016-06-30 (×2): 2 via ORAL
  Administered 2016-06-30 – 2016-07-01 (×5): 1 via ORAL
  Filled 2016-06-26 (×3): qty 1
  Filled 2016-06-26 (×2): qty 2
  Filled 2016-06-26: qty 1
  Filled 2016-06-26: qty 2
  Filled 2016-06-26 (×2): qty 1
  Filled 2016-06-26: qty 2
  Filled 2016-06-26 (×4): qty 1
  Filled 2016-06-26 (×3): qty 2
  Filled 2016-06-26: qty 1

## 2016-06-26 MED ORDER — CEFAZOLIN SODIUM-DEXTROSE 2-4 GM/100ML-% IV SOLN
2.0000 g | Freq: Three times a day (TID) | INTRAVENOUS | Status: DC
  Administered 2016-06-27: 2 g via INTRAVENOUS
  Filled 2016-06-26 (×3): qty 100

## 2016-06-26 MED ORDER — ROSUVASTATIN CALCIUM 10 MG PO TABS
10.0000 mg | ORAL_TABLET | Freq: Every day | ORAL | Status: DC
  Administered 2016-06-27 – 2016-07-01 (×5): 10 mg via ORAL
  Filled 2016-06-26 (×5): qty 1

## 2016-06-26 MED ORDER — PARICALCITOL 1 MCG PO CAPS
1.0000 ug | ORAL_CAPSULE | ORAL | Status: DC
  Filled 2016-06-26 (×2): qty 1

## 2016-06-26 NOTE — ED Triage Notes (Signed)
Pt via EMS from home for generalized body pain with chronic pain, pt was taken off blood thinners last week.

## 2016-06-26 NOTE — Progress Notes (Signed)
ANTICOAGULATION CONSULT NOTE - Initial Consult  Pharmacy Consult for warfarin dosing Indication: atrial fibrillation  No Known Allergies  Patient Measurements: Height: 4\' 11"  (149.9 cm) Weight: 241 lb 11.2 oz (109.6 kg) IBW/kg (Calculated) : 43.2 Heparin Dosing Weight: n/a  Vital Signs: Temp: 98.7 F (37.1 C) (11/15 2304) Temp Source: Oral (11/15 2304) BP: 122/41 (11/15 2306) Pulse Rate: 70 (11/15 2306)  Labs:  Recent Labs  06/26/16 1938  HGB 8.1*  HCT 24.9*  PLT 322  LABPROT 28.7*  INR 2.64  CREATININE 1.39*    Estimated Creatinine Clearance: 35.6 mL/min (by C-G formula based on SCr of 1.39 mg/dL (H)).   Medical History: Past Medical History:  Diagnosis Date  . Breast cancer, left [facility]) 2013   Mastectomy.   . CAD (coronary artery disease)    s/p cath in 2009 with 2 stents  . Diabetes mellitus without complication ([place])   . GERD (gastroesophageal reflux disease)   . HLD (hyperlipidemia)   . Hypertension   . Hypothyroidism   . Paroxysmal atrial fibrillation (HCC)     Medications:  Home dose in med rec is 2.5 mg daily.  Assessment: INR 2.64 on admission  Goal of Therapy:  INR 2-3    Plan:  Reinstate home regimen of 2.5 mg daily. Daily INR ordered while on abx.  Cylie Dor S 06/26/2016,11:50 PM

## 2016-06-26 NOTE — H&P (Signed)
[place] [facility] [place] NAME: Regina [name]    MR#:  [id number]  DATE OF BIRTH:  [birth year]  DATE OF ADMISSION:  06/26/2016  PRIMARY CARE PHYSICIAN: Donnie Coffin, MD   REQUESTING/REFERRING PHYSICIAN: Alfred Levins, MD  CHIEF COMPLAINT:   Chief Complaint  Patient presents with  . Generalized Body Aches    HISTORY OF PRESENT ILLNESS:  Regina [name]  is a 82 y.o. female who presents with Right lower extremity pain, erythema, warmth. Patient has had some low-grade fevers. She recently had Doppler of her lower extremity that was negative for DVT. White blood cell count was very mildly elevated, lactic acid was also mildly elevated. She was given antibiotics in the ED and hospitalists were called for admission.  PAST MEDICAL HISTORY:   Past Medical History:  Diagnosis Date  . Breast cancer, left [facility]) 2013   Mastectomy.   . CAD (coronary artery disease)    s/p cath in 2009 with 2 stents  . Diabetes mellitus without complication ([place])   . GERD (gastroesophageal reflux disease)   . HLD (hyperlipidemia)   . Hypertension   . Hypothyroidism   . Paroxysmal atrial fibrillation ([place])     PAST SURGICAL HISTORY:   Past Surgical History:  Procedure Laterality Date  . BREAST LUMPECTOMY    . CHOLECYSTECTOMY    . MASTECTOMY Left     SOCIAL HISTORY:   Social History  Substance Use Topics  . Smoking status: Never Smoker  . Smokeless tobacco: Never Used  . Alcohol use No    FAMILY HISTORY:   Family History  Problem Relation Age of Onset  . Kidney failure Mother   . Lung cancer Father     DRUG ALLERGIES:  No Known Allergies  MEDICATIONS AT HOME:   Prior to Admission medications   Medication Sig Start Date End Date Taking? Authorizing Provider  acetaminophen (TYLENOL) 325 MG tablet Take 2 tablets (650 mg total) by mouth every 6 (six) hours as needed for mild pain (or Fever >/= 101). 02/21/15   Nicholes Mango, MD  bumetanide (BUMEX) 2  MG tablet Take 2 mg by mouth daily.    Historical Provider, MD  cephALEXin (KEFLEX) 500 MG capsule Take 1 capsule (500 mg total) by mouth 3 (three) times daily. 02/19/15   Hinda Kehr, MD  clopidogrel (PLAVIX) 75 MG tablet Take 75 mg by mouth daily.    Historical Provider, MD  esomeprazole (NEXIUM) 40 MG capsule Take 40 mg by mouth daily.     Historical Provider, MD  insulin aspart protamine- aspart (NOVOLOG MIX 70/30) (70-30) 100 UNIT/ML injection Inject 0.28 mLs (28 Units total) into the skin 2 (two) times daily with a meal. 02/21/15   Nicholes Mango, MD  levothyroxine (SYNTHROID, LEVOTHROID) 50 MCG tablet Take 50 mcg by mouth daily.    Historical Provider, MD  lisinopril (PRINIVIL,ZESTRIL) 20 MG tablet Take 20 mg by mouth daily.    Historical Provider, MD  metoprolol succinate (TOPROL-XL) 50 MG 24 hr tablet Take 50 mg by mouth 2 (two) times daily.    Historical Provider, MD  paricalcitol (ZEMPLAR) 1 MCG capsule Take 1 mcg by mouth 3 (three) times a week. Pt takes on Monday, Wednesday, and Friday.    Historical Provider, MD  ranolazine (RANEXA) 500 MG 12 hr tablet Take 500 mg by mouth 2 (two) times daily.    Historical Provider, MD  rosuvastatin (CRESTOR) 10 MG tablet Take 10 mg by mouth daily.  Historical Provider, MD  warfarin (COUMADIN) 2.5 MG tablet Take 2.5 mg by mouth daily.    Historical Provider, MD    REVIEW OF SYSTEMS:  Review of Systems  Constitutional: Positive for chills. Negative for fever, malaise/fatigue and weight loss.  HENT: Negative for ear pain, hearing loss and tinnitus.   Eyes: Negative for blurred vision, double vision, pain and redness.  Respiratory: Negative for cough, hemoptysis and shortness of breath.   Cardiovascular: Negative for chest pain, palpitations, orthopnea and leg swelling.  Gastrointestinal: Negative for abdominal pain, constipation, diarrhea, nausea and vomiting.  Genitourinary: Negative for dysuria, frequency and hematuria.  Musculoskeletal: Negative  for back pain, joint pain and neck pain.       Right lower extremity tenderness and swelling  Skin:       Right lower extremity erythema and warmth  Neurological: Negative for dizziness, tremors, focal weakness and weakness.  Endo/Heme/Allergies: Negative for polydipsia. Does not bruise/bleed easily.  Psychiatric/Behavioral: Negative for depression. The patient is not nervous/anxious and does not have insomnia.      VITAL SIGNS:   Vitals:   06/26/16 1903 06/26/16 1907 06/26/16 2030  BP: 136/64  117/80  Pulse: 76  70  Resp: 18  20  Temp: 99.5 F (37.5 C)    TempSrc: Oral    SpO2: 100%  98%  Weight:  106.6 kg (235 lb)   Height:  4\' 11"  (1.499 m)    Wt Readings from Last 3 Encounters:  06/26/16 106.6 kg (235 lb)  06/20/16 106.6 kg (235 lb)  02/20/15 108.5 kg (239 lb 1.6 oz)    PHYSICAL EXAMINATION:  Physical Exam  Vitals reviewed. Constitutional: She is oriented to person, place, and time. She appears well-developed and well-nourished. No distress.  HENT:  Head: Normocephalic and atraumatic.  Mouth/Throat: Oropharynx is clear and moist.  Eyes: Conjunctivae and EOM are normal. Pupils are equal, round, and reactive to light. No scleral icterus.  Neck: Normal range of motion. Neck supple. No JVD present. No thyromegaly present.  Cardiovascular: Normal rate, regular rhythm and intact distal pulses.  Exam reveals no gallop and no friction rub.   No murmur heard. Respiratory: Effort normal and breath sounds normal. No respiratory distress. She has no wheezes. She has no rales.  GI: Soft. Bowel sounds are normal. She exhibits no distension. There is no tenderness.  Musculoskeletal: Normal range of motion. She exhibits tenderness (right lower extremity). She exhibits no edema.  No arthritis, no gout  Lymphadenopathy:    She has no cervical adenopathy.  Neurological: She is alert and oriented to person, place, and time. No cranial nerve deficit.  No dysarthria, no aphasia  Skin:  Skin is warm and dry. No rash noted. There is erythema (right lower extremity with warmth and swelling).  Psychiatric: She has a normal mood and affect. Her behavior is normal. Judgment and thought content normal.    LABORATORY PANEL:   CBC  Recent Labs Lab 06/26/16 1938  WBC 11.1*  HGB 8.1*  HCT 24.9*  PLT 322   ------------------------------------------------------------------------------------------------------------------  Chemistries   Recent Labs Lab 06/20/16 1744 06/26/16 1938  NA 130* 132*  K 4.7 4.7  CL 99* 101  CO2 25 23  GLUCOSE 369* 214*  BUN 30* 38*  CREATININE 1.56* 1.39*  CALCIUM 8.4* 8.1*  AST 33  --   ALT 15  --   ALKPHOS 61  --   BILITOT 0.7  --    ------------------------------------------------------------------------------------------------------------------  Cardiac Enzymes No results for input(s):  TROPONINI in the last 168 hours. ------------------------------------------------------------------------------------------------------------------  RADIOLOGY:  No results found.  EKG:   Orders placed or performed during the hospital encounter of 02/19/15  . ED EKG  . ED EKG  . Repeat EKG  . Repeat EKG  . EKG 12-Lead  . EKG 12-Lead  . EKG 12-Lead  . EKG 12-Lead  . EKG    IMPRESSION AND PLAN:  Principal Problem:   Cellulitis - antibiotics ordered per cellulitis order set Active Problems:   Type 2 diabetes mellitus (HCC) - sliding scale insulin with corresponding glucose checks   HTN (hypertension) - continue home meds   Paroxysmal atrial fibrillation ([place]) - continue home meds including warfarin for anticoagulation, INR was at goal tonight   HLD (hyperlipidemia) - continue home meds   GERD (gastroesophageal reflux disease) - home dose PPI  All the records are reviewed and case discussed with ED provider. Management plans discussed with the patient and/or family.  DVT PROPHYLAXIS: Systemic anticoagulation  GI PROPHYLAXIS:  PPI  ADMISSION STATUS: Observation  CODE STATUS: Full Code Status History    Date Active Date Inactive Code Status Order ID Comments User Context   02/20/2015  3:53 AM 02/21/2015  5:11 PM Full Code [id number]  Lance Coon, MD Inpatient      TOTAL TIME TAKING CARE OF THIS PATIENT: 40 minutes.    Zyliah Schier FIELDING 06/26/2016, 9:08 PM  [facility]  Office  [phone]  CC: Primary care physician; Donnie Coffin, MD

## 2016-06-26 NOTE — ED Notes (Signed)
Lactic acid 2.4 MD made aware

## 2016-06-26 NOTE — ED Provider Notes (Signed)
[facility] Emergency Department Provider Note  ____________________________________________  Time seen: Approximately 7:11 PM  I have reviewed the triage vital signs and the nursing notes.   HISTORY  Chief Complaint Generalized Body Aches   HPI Regina [name] is a 82 y.o. female a history of paroxysmal atrial fibrillation on Coumadin, hypertension, hyperlipidemia, diabetes, CAD who presents for evaluation of right lower extremity pain. Patient was seen here 6 days ago with similar complaint at that time had Doppler studies that were negative for DVT. She was found to have a supratherapeutic INR of 12.5. Patient reports that she stopped her Coumadin as recommended and restarted again 3 days ago. She's complaining of severe pain in her right lower extremity, worse when she tries to bear weight, associated with diffuse bruising throughout the right lower extremity, warm and redness. She reports that she constantly bumps herself onto furniture in the house. She denies fever or chills. She denies dizziness, chest pain, shortness of breath.  Past Medical History:  Diagnosis Date  . Breast cancer, left [facility]) 2013   Mastectomy.   . CAD (coronary artery disease)    s/p cath in 2009 with 2 stents  . Diabetes mellitus without complication ([place])   . GERD (gastroesophageal reflux disease)   . HLD (hyperlipidemia)   . Hypertension   . Hypothyroidism   . Paroxysmal atrial fibrillation [facility])     Patient Active Problem List   Diagnosis Date Noted  . UTI (lower urinary tract infection) 02/20/2015  . Elevated troponin 02/20/2015  . Hypoglycemia 02/20/2015  . Type 2 diabetes mellitus ([place]) 02/20/2015  . HTN (hypertension) 02/20/2015  . HLD (hyperlipidemia) 02/20/2015  . Paroxysmal atrial fibrillation ([place]) 02/20/2015  . GERD (gastroesophageal reflux disease) 02/20/2015    Past Surgical History:  Procedure Laterality Date  . BREAST LUMPECTOMY    . CHOLECYSTECTOMY     . MASTECTOMY Left     Prior to Admission medications   Medication Sig Start Date End Date Taking? Authorizing Provider  acetaminophen (TYLENOL) 325 MG tablet Take 2 tablets (650 mg total) by mouth every 6 (six) hours as needed for mild pain (or Fever >/= 101). 02/21/15   Nicholes Mango, MD  bumetanide (BUMEX) 2 MG tablet Take 2 mg by mouth daily.    Historical Provider, MD  cephALEXin (KEFLEX) 500 MG capsule Take 1 capsule (500 mg total) by mouth 3 (three) times daily. 02/19/15   Hinda Kehr, MD  clopidogrel (PLAVIX) 75 MG tablet Take 75 mg by mouth daily.    Historical Provider, MD  esomeprazole (NEXIUM) 40 MG capsule Take 40 mg by mouth daily.     Historical Provider, MD  insulin aspart protamine- aspart (NOVOLOG MIX 70/30) (70-30) 100 UNIT/ML injection Inject 0.28 mLs (28 Units total) into the skin 2 (two) times daily with a meal. 02/21/15   Nicholes Mango, MD  levothyroxine (SYNTHROID, LEVOTHROID) 50 MCG tablet Take 50 mcg by mouth daily.    Historical Provider, MD  lisinopril (PRINIVIL,ZESTRIL) 20 MG tablet Take 20 mg by mouth daily.    Historical Provider, MD  metoprolol succinate (TOPROL-XL) 50 MG 24 hr tablet Take 50 mg by mouth 2 (two) times daily.    Historical Provider, MD  paricalcitol (ZEMPLAR) 1 MCG capsule Take 1 mcg by mouth 3 (three) times a week. Pt takes on Monday, Wednesday, and Friday.    Historical Provider, MD  ranolazine (RANEXA) 500 MG 12 hr tablet Take 500 mg by mouth 2 (two) times daily.  Historical Provider, MD  rosuvastatin (CRESTOR) 10 MG tablet Take 10 mg by mouth daily.    Historical Provider, MD  warfarin (COUMADIN) 2.5 MG tablet Take 2.5 mg by mouth daily.    Historical Provider, MD    Allergies Patient has no known allergies.  Family History  Problem Relation Age of Onset  . Kidney failure Mother   . Lung cancer Father     Social History Social History  Substance Use Topics  . Smoking status: Never Smoker  . Smokeless tobacco: Never Used  . Alcohol use  No    Review of Systems  Constitutional: Negative for fever. Eyes: Negative for visual changes. ENT: Negative for sore throat. Cardiovascular: Negative for chest pain. Respiratory: Negative for shortness of breath. Gastrointestinal: Negative for abdominal pain, vomiting or diarrhea. Genitourinary: Negative for dysuria. Musculoskeletal: Negative for back pain. + RLE pain and bruising Skin: Negative for rash. Neurological: Negative for headaches, weakness or numbness.  ____________________________________________   PHYSICAL EXAM:  VITAL SIGNS: ED Triage Vitals  Enc Vitals Group     BP 06/26/16 1903 136/64     Pulse Rate 06/26/16 1903 76     Resp 06/26/16 1903 18     Temp --      Temp src --      SpO2 06/26/16 1903 100 %     Weight 06/26/16 1907 235 lb (106.6 kg)     Height 06/26/16 1907 4\' 11"  (1.499 m)     Head Circumference --      Peak Flow --      Pain Score 06/26/16 1908 10     Pain Loc --      Pain Edu? --      Excl. in [place]? --     Constitutional: Alert and oriented. Well appearing and in no apparent distress. HEENT:      Head: Normocephalic and atraumatic.         Eyes: Conjunctivae are normal. Sclera is non-icteric. EOMI. PERRL      Mouth/Throat: Mucous membranes are moist.       Neck: Supple with no signs of meningismus. Cardiovascular: Regular rate and rhythm. No murmurs, gallops, or rubs. 2+ symmetrical distal pulses are present in all extremities. No JVD. Respiratory: Normal respiratory effort. Lungs are clear to auscultation bilaterally. No wheezes, crackles, or rhonchi.  Gastrointestinal: Soft, non tender, and non distended with positive bowel sounds. No rebound or guarding. Musculoskeletal: Extensive bruising of the RLE especially from knee down, also brusiing noted on the medial aspect of the R thigh. Severe ttp of the RLE, soft compartments, + distal DP pulse, no ttp with extension/ flexion of big toe. Leg is warm to the touch. Neurologic: Normal speech  and language. Face is symmetric. Moving all extremities. No gross focal neurologic deficits are appreciated. Skin: Skin is warm, dry and intact. No rash noted. Psychiatric: Mood and affect are normal. Speech and behavior are normal.  ____________________________________________   LABS (all labs ordered are listed, but only abnormal results are displayed)  Labs Reviewed  CBC WITH DIFFERENTIAL/PLATELET - Abnormal; Notable for the following:       Result Value   WBC 11.1 (*)    RBC 2.72 (*)    Hemoglobin 8.1 (*)    HCT 24.9 (*)    RDW 15.2 (*)    Neutro Abs 9.4 (*)    Lymphs Abs 0.5 (*)    Monocytes Absolute 1.1 (*)    All other components within normal limits  BASIC  METABOLIC PANEL - Abnormal; Notable for the following:    Sodium 132 (*)    Glucose, Bld 214 (*)    BUN 38 (*)    Creatinine, Ser 1.39 (*)    Calcium 8.1 (*)    GFR calc non Af Amer 35 (*)    GFR calc Af Amer 40 (*)    All other components within normal limits  PROTIME-INR - Abnormal; Notable for the following:    Prothrombin Time 28.7 (*)    All other components within normal limits  LACTIC ACID, PLASMA - Abnormal; Notable for the following:    Lactic Acid, Venous 2.4 (*)    All other components within normal limits   ____________________________________________  EKG  none  ____________________________________________  RADIOLOGY  none  ____________________________________________   PROCEDURES  Procedure(s) performed: None Procedures Critical Care performed:  None ____________________________________________   INITIAL IMPRESSION / ASSESSMENT AND PLAN / ED COURSE  82 y.o. female a history of paroxysmal atrial fibrillation on Coumadin, hypertension, hyperlipidemia, diabetes, CAD who presents for evaluation of worsening right lower extremity pain x 6 days. Patient seen here 6 days ago with minimal bruising in the setting of supratherapeutic INR. Negative Doppler studies. Patient reports that the pain  has gotten progressively worse and now she is barely able to walk due to its severity. She has diffuse bruising from the knee down with no evidence of compartment syndrome. The leg feels warm to the touch when compared to the left lower extremity which could be concerning for possible cellulitis. Her white count is mildly elevated at 11.1, patient has a low-grade temp of 99.5, and lactate 2.4. Will give 1g of ancef and admit to hospitalist.  Clinical Course     Pertinent labs & imaging results that were available during my care of the patient were reviewed by me and considered in my medical decision making (see chart for details).    ____________________________________________   FINAL CLINICAL IMPRESSION(S) / ED DIAGNOSES  Final diagnoses:  Cellulitis of right lower extremity      NEW MEDICATIONS STARTED DURING THIS VISIT:  New Prescriptions   No medications on file     Note:  This document was prepared using Dragon voice recognition software and may include unintentional dictation errors.    Rudene Re, MD 06/26/16 2051

## 2016-06-27 ENCOUNTER — Observation Stay: Payer: Medicare Other

## 2016-06-27 ENCOUNTER — Inpatient Hospital Stay: Payer: Medicare Other

## 2016-06-27 DIAGNOSIS — E039 Hypothyroidism, unspecified: Secondary | ICD-10-CM | POA: Diagnosis present

## 2016-06-27 DIAGNOSIS — K219 Gastro-esophageal reflux disease without esophagitis: Secondary | ICD-10-CM | POA: Diagnosis present

## 2016-06-27 DIAGNOSIS — L03115 Cellulitis of right lower limb: Secondary | ICD-10-CM | POA: Diagnosis present

## 2016-06-27 DIAGNOSIS — I480 Paroxysmal atrial fibrillation: Secondary | ICD-10-CM | POA: Diagnosis present

## 2016-06-27 DIAGNOSIS — Z794 Long term (current) use of insulin: Secondary | ICD-10-CM | POA: Diagnosis not present

## 2016-06-27 DIAGNOSIS — Z841 Family history of disorders of kidney and ureter: Secondary | ICD-10-CM | POA: Diagnosis not present

## 2016-06-27 DIAGNOSIS — Z853 Personal history of malignant neoplasm of breast: Secondary | ICD-10-CM | POA: Diagnosis not present

## 2016-06-27 DIAGNOSIS — Z79899 Other long term (current) drug therapy: Secondary | ICD-10-CM | POA: Diagnosis not present

## 2016-06-27 DIAGNOSIS — E1122 Type 2 diabetes mellitus with diabetic chronic kidney disease: Secondary | ICD-10-CM | POA: Diagnosis present

## 2016-06-27 DIAGNOSIS — E875 Hyperkalemia: Secondary | ICD-10-CM | POA: Diagnosis present

## 2016-06-27 DIAGNOSIS — N179 Acute kidney failure, unspecified: Secondary | ICD-10-CM | POA: Diagnosis present

## 2016-06-27 DIAGNOSIS — Z9012 Acquired absence of left breast and nipple: Secondary | ICD-10-CM | POA: Diagnosis not present

## 2016-06-27 DIAGNOSIS — I2510 Atherosclerotic heart disease of native coronary artery without angina pectoris: Secondary | ICD-10-CM | POA: Diagnosis present

## 2016-06-27 DIAGNOSIS — E785 Hyperlipidemia, unspecified: Secondary | ICD-10-CM | POA: Diagnosis present

## 2016-06-27 DIAGNOSIS — Z955 Presence of coronary angioplasty implant and graft: Secondary | ICD-10-CM | POA: Diagnosis not present

## 2016-06-27 DIAGNOSIS — N184 Chronic kidney disease, stage 4 (severe): Secondary | ICD-10-CM | POA: Diagnosis present

## 2016-06-27 DIAGNOSIS — I129 Hypertensive chronic kidney disease with stage 1 through stage 4 chronic kidney disease, or unspecified chronic kidney disease: Secondary | ICD-10-CM | POA: Diagnosis present

## 2016-06-27 DIAGNOSIS — Z7901 Long term (current) use of anticoagulants: Secondary | ICD-10-CM | POA: Diagnosis not present

## 2016-06-27 DIAGNOSIS — Z7902 Long term (current) use of antithrombotics/antiplatelets: Secondary | ICD-10-CM | POA: Diagnosis not present

## 2016-06-27 LAB — GLUCOSE, CAPILLARY
GLUCOSE-CAPILLARY: 219 mg/dL — AB (ref 65–99)
GLUCOSE-CAPILLARY: 227 mg/dL — AB (ref 65–99)
Glucose-Capillary: 181 mg/dL — ABNORMAL HIGH (ref 65–99)
Glucose-Capillary: 231 mg/dL — ABNORMAL HIGH (ref 65–99)
Glucose-Capillary: 233 mg/dL — ABNORMAL HIGH (ref 65–99)

## 2016-06-27 LAB — CBC
HEMATOCRIT: 21.7 % — AB (ref 35.0–47.0)
HEMOGLOBIN: 7.2 g/dL — AB (ref 12.0–16.0)
MCH: 29.9 pg (ref 26.0–34.0)
MCHC: 33.1 g/dL (ref 32.0–36.0)
MCV: 90.2 fL (ref 80.0–100.0)
Platelets: 297 10*3/uL (ref 150–440)
RBC: 2.41 MIL/uL — AB (ref 3.80–5.20)
RDW: 15 % — ABNORMAL HIGH (ref 11.5–14.5)
WBC: 11.8 10*3/uL — AB (ref 3.6–11.0)

## 2016-06-27 LAB — BASIC METABOLIC PANEL
ANION GAP: 7 (ref 5–15)
BUN: 38 mg/dL — ABNORMAL HIGH (ref 6–20)
CO2: 23 mmol/L (ref 22–32)
Calcium: 7.7 mg/dL — ABNORMAL LOW (ref 8.9–10.3)
Chloride: 101 mmol/L (ref 101–111)
Creatinine, Ser: 1.47 mg/dL — ABNORMAL HIGH (ref 0.44–1.00)
GFR, EST AFRICAN AMERICAN: 38 mL/min — AB (ref >60)
GFR, EST NON AFRICAN AMERICAN: 32 mL/min — AB (ref >60)
Glucose, Bld: 202 mg/dL — ABNORMAL HIGH (ref 65–99)
POTASSIUM: 4.6 mmol/L (ref 3.5–5.1)
SODIUM: 131 mmol/L — AB (ref 135–145)

## 2016-06-27 LAB — LACTIC ACID, PLASMA: LACTIC ACID, VENOUS: 0.9 mmol/L (ref 0.5–1.9)

## 2016-06-27 MED ORDER — SODIUM CHLORIDE 0.9 % IV BOLUS (SEPSIS)
1000.0000 mL | Freq: Once | INTRAVENOUS | Status: AC
  Administered 2016-06-27: 1000 mL via INTRAVENOUS

## 2016-06-27 MED ORDER — CEFAZOLIN IN D5W 1 GM/50ML IV SOLN
1.0000 g | Freq: Two times a day (BID) | INTRAVENOUS | Status: DC
  Administered 2016-06-27 – 2016-06-28 (×2): 1 g via INTRAVENOUS
  Filled 2016-06-27 (×3): qty 50

## 2016-06-27 MED ORDER — IOPAMIDOL (ISOVUE-300) INJECTION 61%
75.0000 mL | Freq: Once | INTRAVENOUS | Status: AC | PRN
  Administered 2016-06-27: 75 mL via INTRAVENOUS

## 2016-06-27 MED ORDER — SODIUM CHLORIDE 0.9 % IV SOLN
INTRAVENOUS | Status: AC
  Administered 2016-06-27: 14:00:00 via INTRAVENOUS

## 2016-06-27 MED ORDER — INSULIN GLARGINE 100 UNIT/ML ~~LOC~~ SOLN
15.0000 [IU] | Freq: Every day | SUBCUTANEOUS | Status: DC
  Administered 2016-06-27 – 2016-06-28 (×2): 15 [IU] via SUBCUTANEOUS
  Filled 2016-06-27 (×3): qty 0.15

## 2016-06-27 MED ORDER — SODIUM CHLORIDE 0.9 % IV SOLN
INTRAVENOUS | Status: DC
  Administered 2016-06-27 – 2016-06-28 (×2): via INTRAVENOUS

## 2016-06-27 NOTE — Progress Notes (Addendum)
This patient has received 10 ml's of IV Isovue 300 (type of contrast) contrast extravasation into Rt hand (part of body) during a CT Rt Tib-Fib W contrast exam.  The exam was performed on (date) 27 Jun 2016  Site / affected area assessed by Dr. Kathreen Devoid  TKV

## 2016-06-27 NOTE — Progress Notes (Addendum)
Inpatient Diabetes Program Recommendations  AACE/ADA: New Consensus Statement on Inpatient Glycemic Control (2015)  Target Ranges:  Prepandial:   less than 140 mg/dL      Peak postprandial:   less than 180 mg/dL (1-2 hours)      Critically ill patients:  140 - 180 mg/dL   Results for Regina [name], Regina [name] (MRN [id number]) as of 06/27/2016 13:42  Ref. Range 06/26/2016 22:56 06/27/2016 07:51 06/27/2016 12:10  Glucose-Capillary Latest Ref Range: 65 - 99 mg/dL 181 (H) 219 (H) 227 (H)    Results for Regina [name], Regina [name] (MRN [id number]) as of 06/27/2016 09:47  Ref. Range 02/20/2015 07:21  Hemoglobin A1C Latest Ref Range: 4.0 - 6.0 % 6.6 (H)   A1C is pending this admission, will follow.  Review of Glycemic Control  Diabetes history: DM, Obesity, elevated BUN and creatinine Outpatient Diabetes medications: Novolog Mix (70-30) 56 units daily with breakfast   Note: Spoke with patient and confirmed dose amount, patient states checks CBG's in am when takes CBG.  Patient states does not experience low CBG's during [place].  Patient states she has taken insulin this way for years.  Current orders for Inpatient glycemic control: Novolog sensitive correction 0-9 units TIDAC and 0-5 units QHS, carb modified diet  Inpatient Diabetes Program Recommendations: Please consider Lantus 20 units QHS (= 50% of home basal amount of 40 units) and Novolog meal coverage 3 units with meals ( = 50% of home correction of 17 units) if patient eats > 50% of meal.   Thank you,  Windy Carina, RN, BSN Diabetes Coordinator Inpatient Diabetes Program [phone] (Team Pager)

## 2016-06-27 NOTE — Progress Notes (Signed)
[facility] [place] NAME: Regina [name]    MR#:  [id number]  DATE OF BIRTH:  09-26-1933  SUBJECTIVE:  Patient with nausea this a.m. She denies recent fall however her right lower extremity has profound amount of bruising  REVIEW OF SYSTEMS:    Review of Systems  Constitutional: Negative.  Negative for chills, fever and malaise/fatigue.  HENT: Negative.  Negative for ear discharge, ear pain, hearing loss, nosebleeds and sore throat.   Eyes: Negative.  Negative for blurred vision and pain.  Respiratory: Negative.  Negative for cough, hemoptysis, shortness of breath and wheezing.   Cardiovascular: Negative.  Negative for chest pain, palpitations and leg swelling.  Gastrointestinal: Negative.  Negative for abdominal pain, blood in stool, diarrhea, nausea and vomiting.  Genitourinary: Negative.  Negative for dysuria.  Musculoskeletal: Negative.  Negative for back pain.       Right leg bruised and painful  Skin: Negative.   Neurological: Negative for dizziness, tremors, speech change, focal weakness, seizures and headaches.  Endo/Heme/Allergies: Negative.  Does not bruise/bleed easily.  Psychiatric/Behavioral: Negative.  Negative for depression, hallucinations and suicidal ideas.    Tolerating Diet: Nauseous this morning after eating breakfast      DRUG ALLERGIES:  No Known Allergies  VITALS:  Blood pressure (!) 105/34, pulse 71, temperature 99.1 F (37.3 C), temperature source Oral, resp. rate 20, height 4\' 11"  (1.499 m), weight 109.6 kg (241 lb 11.2 oz), SpO2 98 %.  PHYSICAL EXAMINATION:   Physical Exam  Constitutional: She is oriented to person, place, and time and well-developed, well-nourished, and in no distress. No distress.  HENT:  Head: Normocephalic.  Eyes: No scleral icterus.  Neck: Normal range of motion. Neck supple. No JVD present. No tracheal deviation present.  Cardiovascular: Normal rate, regular rhythm and normal  heart sounds.  Exam reveals no gallop and no friction rub.   No murmur heard. Pulmonary/Chest: Effort normal and breath sounds normal. No respiratory distress. She has no wheezes. She has no rales. She exhibits no tenderness.  Abdominal: Soft. Bowel sounds are normal. She exhibits no distension and no mass. There is no tenderness. There is no rebound and no guarding.  Musculoskeletal: Normal range of motion. She exhibits edema (right leg with bruising and 4+ edema tender to touch).  Neurological: She is alert and oriented to person, place, and time.  Skin: Skin is warm. No rash noted. No erythema.  Bruising extends down her entire leg but most predominant layover knee decreased range of motion Pulses are audible with Doppler  Cellulitis located mid calf down  Psychiatric: Affect and judgment normal.      LABORATORY PANEL:   CBC  Recent Labs Lab 06/27/16 0439  WBC 11.8*  HGB 7.2*  HCT 21.7*  PLT 297   ------------------------------------------------------------------------------------------------------------------  Chemistries   Recent Labs Lab 06/20/16 1744  06/27/16 0439  NA 130*  < > 131*  K 4.7  < > 4.6  CL 99*  < > 101  CO2 25  < > 23  GLUCOSE 369*  < > 202*  BUN 30*  < > 38*  CREATININE 1.56*  < > 1.47*  CALCIUM 8.4*  < > 7.7*  AST 33  --   --   ALT 15  --   --   ALKPHOS 61  --   --   BILITOT 0.7  --   --   < > = values in this interval not displayed. ------------------------------------------------------------------------------------------------------------------  Cardiac  Enzymes No results for input(s): TROPONINI in the last 168 hours. ------------------------------------------------------------------------------------------------------------------  RADIOLOGY:  No results found.   ASSESSMENT AND PLAN:   82 y/o female with hx of CAD/stents, morbid obesity and HTN Who presents with right lower extremity pain  1. Right lower extremity pain with  extensive bruising (recent INR greater than 11) and edema along with mild cellulitis: With audible peripheral pulses CT scan of leg to evaluate extent of hematoma and may need surgical evaluation to rule out compartment syndrome Continue Ancef Doppler negative for DVT  2. PAF on anticoagulation with Coumadin Pharmacy consult for Coumadin Continue metoprolol for heart rate control  3. CAD: Continue metoprolol, lisinopril, Crestor and Plavix, Ranexa  4. Diabetes: Continue sliding scale insulin and ADA diet  5. Hypothyroid: Continue Synthroid  Management plans discussed with the patient and she is in agreement.  Discussed with nursing staff CODE STATUS: full  TOTAL TIME TAKING CARE OF THIS PATIENT: 40 minutes.     POSSIBLE D/C 2-3 days, DEPENDING ON CLINICAL CONDITION.   Chantel Teti M.D on 06/27/2016 at 10:29 AM  Between 7am to 6pm - Pager - [phone] After 6pm go to [email or web address] - password [email or web address] [facility]  Office  [phone]  CC: Primary care physician; Donnie Coffin, MD  Note: This dictation was prepared with Dragon dictation along with smaller phrase technology. Any transcriptional errors that result from this process are unintentional.

## 2016-06-27 NOTE — Progress Notes (Signed)
PT Cancellation Note  Patient Details Name: Regina [name] MRN: [id number] DOB: 1934-06-14   Cancelled Treatment:    Reason Eval/Treat Not Completed: Other (comment). Consult received and chart reviewed. Pt is pending CT of tib/fib for possible hematoma. Per discussion with RN, will hold until results received and medical plan in place. Pt in severe pain at this time. Will re-attempt at another time.   Nadiyah Zeis 06/27/2016, 2:29 PM  Greggory Stallion, PT, DPT [phone]

## 2016-06-27 NOTE — Care Management (Signed)
Patient admitted for lower extremity pain, and cellulitis.  Patient lives at home with her son.  Has a cane and walker in the home for ambulation.  Goes to [facility].  PT consult pending.  RNCM following for discharge planning needs.

## 2016-06-28 LAB — CBC
HEMATOCRIT: 22.2 % — AB (ref 35.0–47.0)
Hemoglobin: 7.5 g/dL — ABNORMAL LOW (ref 12.0–16.0)
MCH: 30.4 pg (ref 26.0–34.0)
MCHC: 33.6 g/dL (ref 32.0–36.0)
MCV: 90.5 fL (ref 80.0–100.0)
PLATELETS: 318 10*3/uL (ref 150–440)
RBC: 2.45 MIL/uL — ABNORMAL LOW (ref 3.80–5.20)
RDW: 14.9 % — AB (ref 11.5–14.5)
WBC: 10.6 10*3/uL (ref 3.6–11.0)

## 2016-06-28 LAB — PROTIME-INR
INR: 2.36
PROTHROMBIN TIME: 26.2 s — AB (ref 11.4–15.2)

## 2016-06-28 LAB — GLUCOSE, CAPILLARY
GLUCOSE-CAPILLARY: 257 mg/dL — AB (ref 65–99)
GLUCOSE-CAPILLARY: 217 mg/dL — AB (ref 65–99)
Glucose-Capillary: 193 mg/dL — ABNORMAL HIGH (ref 65–99)
Glucose-Capillary: 259 mg/dL — ABNORMAL HIGH (ref 65–99)

## 2016-06-28 LAB — PREPARE RBC (CROSSMATCH)

## 2016-06-28 LAB — ABO/RH: ABO/RH(D): O POS

## 2016-06-28 LAB — HEMOGLOBIN A1C
Hgb A1c MFr Bld: 6.7 % — ABNORMAL HIGH (ref 4.8–5.6)
MEAN PLASMA GLUCOSE: 146 mg/dL

## 2016-06-28 MED ORDER — IPRATROPIUM BROMIDE 0.03 % NA SOLN
2.0000 | Freq: Three times a day (TID) | NASAL | Status: DC | PRN
  Administered 2016-06-30: 2 via NASAL
  Filled 2016-06-28: qty 30

## 2016-06-28 MED ORDER — SODIUM CHLORIDE 0.9 % IV SOLN
Freq: Once | INTRAVENOUS | Status: AC
  Administered 2016-06-28: 16:00:00 via INTRAVENOUS

## 2016-06-28 MED ORDER — ENSURE ENLIVE PO LIQD
237.0000 mL | Freq: Two times a day (BID) | ORAL | Status: DC
  Administered 2016-06-28 – 2016-07-01 (×7): 237 mL via ORAL

## 2016-06-28 MED ORDER — SENNOSIDES-DOCUSATE SODIUM 8.6-50 MG PO TABS
2.0000 | ORAL_TABLET | Freq: Every day | ORAL | Status: DC
  Administered 2016-06-28 – 2016-06-30 (×3): 2 via ORAL
  Filled 2016-06-28 (×4): qty 2

## 2016-06-28 MED ORDER — SODIUM CHLORIDE 0.9 % IV SOLN
3.0000 g | Freq: Four times a day (QID) | INTRAVENOUS | Status: DC
  Administered 2016-06-28 – 2016-06-29 (×5): 3 g via INTRAVENOUS
  Filled 2016-06-28 (×7): qty 3

## 2016-06-28 MED ORDER — METOPROLOL TARTRATE 25 MG PO TABS
25.0000 mg | ORAL_TABLET | Freq: Two times a day (BID) | ORAL | Status: DC
  Administered 2016-06-28 – 2016-07-01 (×5): 25 mg via ORAL
  Filled 2016-06-28 (×6): qty 1

## 2016-06-28 MED ORDER — CEPASTAT 14.5 MG MT LOZG
1.0000 | LOZENGE | OROMUCOSAL | Status: DC | PRN
  Administered 2016-06-30: 1 via BUCCAL
  Filled 2016-06-28 (×2): qty 9

## 2016-06-28 NOTE — Progress Notes (Signed)
Called Dr. Marcille Blanco in regards to BP 98/42. No new orders at this time.

## 2016-06-28 NOTE — Progress Notes (Signed)
Pharmacy Antibiotic Note  Cortni Newvine [name] is a 82 y.o. female admitted on 06/26/2016 with cellulitis.  Pharmacy has been consulted for unasyn dosing.  Patient on cefazolin, cellulitis worsening now switching to ampicillin/sulbactam  Plan:  Ampicillin/sulbactam 3gm IV Q6H  Height: 4\' 11"  (149.9 cm) Weight: 241 lb 11.2 oz (109.6 kg) IBW/kg (Calculated) : 43.2  Temp (24hrs), Avg:98.1 F (36.7 C), Min:98.1 F (36.7 C), Max:98.2 F (36.8 C)   Recent Labs Lab 06/26/16 1938 06/26/16 1939 06/27/16 0439 06/28/16 0851  WBC 11.1*  --  11.8* 10.6  CREATININE 1.39*  --  1.47*  --   LATICACIDVEN  --  2.4* 0.9  --     Estimated Creatinine Clearance: 33.6 mL/min (by C-G formula based on SCr of 1.47 mg/dL (H)).    No Known Allergies  Antimicrobials this admission: Cefazolin 11/15>>11/17 Unasyn 11/17 >>  Dose adjustments this admission:   Microbiology results:   Thank you for allowing pharmacy to be a part of this patient's care.  Regina [name] C 06/28/2016 9:25 AM

## 2016-06-28 NOTE — Progress Notes (Addendum)
MD wants IVF discontinued.  Patient changed to saline lack.  IS ordered.  Due to low BP MD Kalisetti said to hold am dose of lisinopril and metoprolol.  Drink supplement to be ordered

## 2016-06-28 NOTE — Progress Notes (Signed)
Order given to order atrovent nasal spray like the pt takes at home

## 2016-06-28 NOTE — Progress Notes (Addendum)
[facility] NAME: Regina [name]    MR#:  [id number]  DATE OF BIRTH:  Jan 24, 1934  SUBJECTIVE:  CHIEF COMPLAINT:   Chief Complaint  Patient presents with  . Generalized Body Aches   - Right leg swelling. Patient is very anxious and very emotional. -Complains of significant pain of the leg. Hemoglobin at 7.2 yesterday.  REVIEW OF SYSTEMS:  Review of Systems  Constitutional: Positive for malaise/fatigue. Negative for chills, fever and weight loss.  HENT: Positive for sore throat. Negative for ear discharge, ear pain, hearing loss and nosebleeds.   Eyes: Negative for blurred vision, double vision and photophobia.  Respiratory: Positive for cough. Negative for hemoptysis, shortness of breath and wheezing.   Cardiovascular: Positive for orthopnea and leg swelling. Negative for chest pain and palpitations.  Gastrointestinal: Positive for nausea. Negative for abdominal pain, constipation, diarrhea, melena and vomiting.  Genitourinary: Negative for dysuria, frequency, hematuria and urgency.  Musculoskeletal: Positive for back pain and myalgias. Negative for neck pain.  Skin: Negative for rash.  Neurological: Negative for dizziness, sensory change, speech change, focal weakness and headaches.  Endo/Heme/Allergies: Does not bruise/bleed easily.  Psychiatric/Behavioral: Negative for depression. The patient is nervous/anxious.     DRUG ALLERGIES:  No Known Allergies  VITALS:  Blood pressure (!) 108/52, pulse 71, temperature 98.1 F (36.7 C), temperature source Oral, resp. rate 20, height 4\' 11"  (1.499 m), weight 109.6 kg (241 lb 11.2 oz), SpO2 97 %.  PHYSICAL EXAMINATION:  Physical Exam  GENERAL:  82 y.o.-year-old patient lying in the bed with no acute distress.  EYES: Pupils equal, round, reactive to light and accommodation. No scleral icterus. Extraocular muscles intact.  HEENT: Head atraumatic, normocephalic. Oropharynx and  nasopharynx clear.  NECK:  Supple, no jugular venous distention. No thyroid enlargement, no tenderness.  LUNGS: Normal breath sounds bilaterally, no wheezing, rales,rhonchi or crepitation. No use of accessory muscles of respiration. Decreased bibasilar breath sounds CARDIOVASCULAR: S1, S2 normal. No rubs, or gallops. 2/6 systolic murmur present ABDOMEN: Soft, nontender, nondistended. Bowel sounds present. No organomegaly or mass.  EXTREMITIES: No cyanosis, or clubbing. 1+ LLE edema, 2+ RLE edema, echhymotic right leg NEUROLOGIC: Cranial nerves II through XII are intact. Muscle strength 5/5 in all extremities. Sensation intact. Gait not checked.  PSYCHIATRIC: The patient is alert and oriented x 3. Very anxious, emotional, tearful SKIN: No obvious rash, lesion, or ulcer.    LABORATORY PANEL:   CBC  Recent Labs Lab 06/27/16 0439  WBC 11.8*  HGB 7.2*  HCT 21.7*  PLT 297   ------------------------------------------------------------------------------------------------------------------  Chemistries   Recent Labs Lab 06/27/16 0439  NA 131*  K 4.6  CL 101  CO2 23  GLUCOSE 202*  BUN 38*  CREATININE 1.47*  CALCIUM 7.7*   ------------------------------------------------------------------------------------------------------------------  Cardiac Enzymes No results for input(s): TROPONINI in the last 168 hours. ------------------------------------------------------------------------------------------------------------------  RADIOLOGY:  Ct Tibia Fibula Right W Contrast  Result Date: 06/27/2016 CLINICAL DATA:  Fever, right lower extremity pain, erythema, and warmth. EXAM: CT OF THE RIGHT TIBIA AND FIBULA WITH CONTRAST TECHNIQUE: Contiguous axial CT images were obtained through the right lower leg and multiplanar reconstruction performed. CONTRAST:  58mL ISOVUE-300 IOPAMIDOL (ISOVUE-300) INJECTION 61%. The patient's initial IV injection extravasated, 10 cc of Isovue-300. COMPARISON:   None FINDINGS: Osteoarthritis of the knee most severely involving the patellofemoral joint and medial compartment. There is diffuse subcutaneous edema in the lower calf extending down into the ankle. This circumferential edema becomes less  severe in the upper calf, although there is some stranding and edema posteriorly in the upper calf which extends posterior to the knee. Edema tracks in Kager's fat pad. We do not demonstrate significant edema within the muscular compartments of the knee. There is fatty atrophy of portions of the medial head gastrocnemius, soleus, and flexor digitorum longus muscles especially in the mid calf region as shown on image 66/7. Considerable arterial atherosclerotic vascular calcification is present. No appreciable venous occlusion. Chondral thinning at the tibiotalar articulation. No abscess identified. IMPRESSION: 1. Diffuse subcutaneous edema in the lower leg, especially in the vicinity of the ankle but also in the upper calf. There are many potential causes including venous insufficiency, poor upstream lymphatic drainage, and cellulitis. No abscess or findings of osteomyelitis. 2. Chronic appearing focal fatty atrophy of portions of the calf and flexor musculature as noted above. 3. Considerable osteoarthritis of the knee. 4. Atherosclerosis. Electronically Signed   By: Van Clines M.D.   On: 06/27/2016 16:49    EKG:   Orders placed or performed during the hospital encounter of 02/19/15  . ED EKG  . ED EKG  . Repeat EKG  . Repeat EKG  . EKG 12-Lead  . EKG 12-Lead  . EKG 12-Lead  . EKG 12-Lead  . EKG    ASSESSMENT AND PLAN:   82 year old female with past medical history significant for breast cancer status post left mastectomy, CAD status post stents, hypertension, diabetes mellitus, hyperlipidemia, paroxysmal atrial fibrillation on Coumadin presents to hospital secondary to worsening right lower extremity swelling.  #1 right lower extremity cellulitis-CT  of the leg showing no compartment syndrome, has pulses. Extensive pain noted. -We'll not tolerate compression stockings. We will keep her leg elevated. -Change Ancef to Unasyn. -Discontinue Coumadin. INR is pending -Dopplers negative for DVT  #2 paroxysmal atrial fibrillation-rate controlled. Change metoprolol to short acting and decrease the dose as blood pressure is borderline. -Discontinue Coumadin at this time. The patient was on aspirin prior to starting on Coumadin. At this time will hold both due to low blood counts.  #3 acute on chronic anemia-had supratherapeutic INR last week. No active bleeding. The right leg is bruised. -INR from today is pending. Hold Coumadin at this time -Discussed about transfusing 1 unit of blood with both patient and her daughter, at this time their decision is pending.  #4 CK D-creatinine stable around 1.4. Monitor at this time. -Hold Bumex for now  #5 hypertension-old lisinopril and Bumex. DC fluids. -Change metoprolol to short acting and lower dose due to low normal blood pressure  #6 diabetes mellitus-on Lantus and sliding scale insulin. Will continue  #7 DVT prophylaxis-Coumadin has been discontinued. Monitor for now  Physical therapy consult when able to. Discussed with daughter over the phone. -Encourage incentive spirometry   All the records are reviewed and case discussed with Care Management/Social Workerr. Management plans discussed with the patient, family and they are in agreement.  CODE STATUS: Full Code  TOTAL TIME TAKING CARE OF THIS PATIENT: 42 minutes.   POSSIBLE D/C IN 2 DAYS, DEPENDING ON CLINICAL CONDITION.   Gladstone Lighter M.D on 06/28/2016 at 8:36 AM  Between 7am to 6pm - Pager - [phone]  After 6pm go to [email or web address] - password [facility]  Office  [phone]  CC: Primary care physician; Donnie Coffin, MD

## 2016-06-28 NOTE — Evaluation (Signed)
Physical Therapy Evaluation Patient Details Name: Regina [name] MRN: [id number] DOB: 03-03-1934 Today's Date: 06/28/2016   History of Present Illness  Pt admitted for cellulitis. Pt with history of DM, HTN, HLD, GERD, afib, and breast cancer.  Pt with complaints of generalized body aches, increased bruising with R LE pain/swelling noted. Pt with negative imaging of R LE at this time.   Clinical Impression  Pt is a pleasant 82 year old female who was admitted for cellulitis. Pt performs bed mobility with mod assist, transfers with min assist, and ambulation with cga and RW. Pt demonstrates deficits with strength/balance/endurance. Pt also becomes slight SOB with minimal exertion, taking +2 for return back to bed. Pt is not currently at baseline level at this time. Would benefit from skilled PT to address above deficits and promote optimal return to PLOF; recommend transition to STR upon discharge from acute hospitalization.       Follow Up Recommendations SNF    Equipment Recommendations       Recommendations for Other Services       Precautions / Restrictions Precautions Precautions: Fall Restrictions Weight Bearing Restrictions: No      Mobility  Bed Mobility Overal bed mobility: Needs Assistance Bed Mobility: Supine to Sit     Supine to sit: Mod assist     General bed mobility comments: assist for sequencing swinging B LEs off bed. Once seated at EOB, pt able to sit with cga. Slight SOB symptoms ntoed with exertion.  Transfers Overall transfer level: Needs assistance Equipment used: Rolling walker (2 wheeled) Transfers: Sit to/from Stand Sit to Stand: Min assist         General transfer comment: assist for pushing from seated surface. Once standing, pt able to stand with cga. Complains of pain in R LE with WBing. Heavy B UE support on rw  Ambulation/Gait Ambulation/Gait assistance: Min guard Ambulation Distance (Feet): 5 Feet Assistive device: Rolling walker  (2 wheeled) Gait Pattern/deviations: Step-to pattern     General Gait Details: limited ambulation performed secondary to pain reported in R LE and SOB symptoms. No LOB noted. Wide BOS during ambulation. Safe technique performed with RW  Stairs            Wheelchair Mobility    Modified Rankin (Stroke Patients Only)       Balance Overall balance assessment: Needs assistance Sitting-balance support: Feet supported Sitting balance-Leahy Scale: Good     Standing balance support: Bilateral upper extremity supported Standing balance-Leahy Scale: Fair                               Pertinent Vitals/Pain Pain Assessment: Faces Faces Pain Scale: Hurts even more Pain Location: R LE Pain Descriptors / Indicators: Aching;Discomfort;Dull Pain Intervention(s): Limited activity within patient's tolerance;Patient requesting pain meds-RN notified    Home Living Family/patient expects to be discharged to:: Private residence Living Arrangements: Children (son) Available Help at Discharge: Family;Available PRN/intermittently Type of Home: House Home Access: Stairs to enter Entrance Stairs-Rails: Right Entrance Stairs-Number of Steps: 2 Home Layout: One level Home Equipment: Walker - 2 wheels;Cane - single point      Prior Function Level of Independence: Independent with assistive device(s)         Comments: ambulatory minimal household distances with RW.      Hand Dominance        Extremity/Trunk Assessment   Upper Extremity Assessment: Generalized weakness (B UE grossly 4/5)  Lower Extremity Assessment: Generalized weakness (R LE grossly 3+/5; L LE grossly 4/5)         Communication   Communication: No difficulties  Cognition Arousal/Alertness: Awake/alert Behavior During Therapy: WFL for tasks assessed/performed Overall Cognitive Status: Within Functional Limits for tasks assessed                      General Comments       Exercises     Assessment/Plan    PT Assessment Patient needs continued PT services  PT Problem List Decreased strength;Decreased activity tolerance;Decreased balance;Decreased mobility;Pain          PT Treatment Interventions Gait training;DME instruction;Therapeutic exercise    PT Goals (Current goals can be found in the Care Plan section)  Acute Rehab PT Goals Patient Stated Goal: to get stronger PT Goal Formulation: With patient Time For Goal Achievement: 07/12/16 Potential to Achieve Goals: Good    Frequency Min 2X/week   Barriers to discharge        Co-evaluation               End of Session Equipment Utilized During Treatment: Gait belt Activity Tolerance: Patient limited by pain Patient left: in bed;with bed alarm set;with nursing/sitter in room Nurse Communication: Mobility status         Time: [id number] PT Time Calculation (min) (ACUTE ONLY): 19 min   Charges:   PT Evaluation $PT Eval Moderate Complexity: 1 Procedure     PT G Codes:        Lisbet Busker 2016/07/17, 4:59 PM  Greggory Stallion, PT, DPT [phone]

## 2016-06-28 NOTE — Progress Notes (Signed)
Inpatient Diabetes Program Recommendations  AACE/ADA: New Consensus Statement on Inpatient Glycemic Control (2015)  Target Ranges:  Prepandial:   less than 140 mg/dL      Peak postprandial:   less than 180 mg/dL (1-2 hours)      Critically ill patients:  140 - 180 mg/dL   Lab Results  Component Value Date   GLUCAP 193 (H) 06/28/2016   HGBA1C 6.7 (H) 06/27/2016  Results for TOMMI, GILROY (MRN [id number]) as of 06/28/2016 12:01  Ref. Range 06/27/2016 07:51 06/27/2016 12:10 06/27/2016 16:52 06/27/2016 21:24 06/28/2016 08:09  Glucose-Capillary Latest Ref Range: 65 - 99 mg/dL 219 (H) 227 (H) 231 (H) 233 (H) 193 (H)   A1C is great.  Review of Glycemic Control  Diabetes history:   DM, Obesity, elevated BUN and creatinine  Outpatient Diabetes medications:   Novolog Mix (70-30) 56 units daily with breakfast   Note: Spoke with patient at bedside yesterday and confirmed dose amount, patient states checks CBG's in am when takes CBG.  Patient states does not experience low CBG's during [place] (able to teach back hypoglycemia and hyperglycemia signs and symptoms).  Patient states she has taken insulin this way for years.  Current orders for Inpatient glycemic control:   Novolog sensitive correction 0-9 units TIDAC and 0-5 units QHS  Lantus 15 units QHS  carb modified diet  Inpatient Diabetes Program Recommendations, please consider :  Increasing Lantus 20 units QHS (= 50% of home basal amount of 40 units)  Adding Novolog meal coverage 3 units with meals ( = 50% of home correction of 17 units) if patient eats > 50% of meal.  Continue sensitive correction as ordered.  Thank you,  Windy Carina, RN, BSN Diabetes Coordinator Inpatient Diabetes Program [phone] (Team Pager)

## 2016-06-28 NOTE — Care Management Important Message (Signed)
Important Message  Patient Details  Name: Regina [name] MRN: [id number] Date of Birth: 1934-05-25   Medicare Important Message Given:  Yes    Beverly Sessions, RN 06/28/2016, 9:54 AM

## 2016-06-29 LAB — BASIC METABOLIC PANEL
Anion gap: 6 (ref 5–15)
BUN: 46 mg/dL — AB (ref 6–20)
CALCIUM: 7.8 mg/dL — AB (ref 8.9–10.3)
CO2: 22 mmol/L (ref 22–32)
CREATININE: 1.99 mg/dL — AB (ref 0.44–1.00)
Chloride: 103 mmol/L (ref 101–111)
GFR calc non Af Amer: 23 mL/min — ABNORMAL LOW (ref >60)
GFR, EST AFRICAN AMERICAN: 26 mL/min — AB (ref >60)
Glucose, Bld: 237 mg/dL — ABNORMAL HIGH (ref 65–99)
Potassium: 4.5 mmol/L (ref 3.5–5.1)
SODIUM: 131 mmol/L — AB (ref 135–145)

## 2016-06-29 LAB — FERRITIN: Ferritin: 211 ng/mL (ref 11–307)

## 2016-06-29 LAB — CBC
HCT: 22.6 % — ABNORMAL LOW (ref 35.0–47.0)
Hemoglobin: 7.9 g/dL — ABNORMAL LOW (ref 12.0–16.0)
MCH: 31.7 pg (ref 26.0–34.0)
MCHC: 34.8 g/dL (ref 32.0–36.0)
MCV: 91.1 fL (ref 80.0–100.0)
Platelets: 324 10*3/uL (ref 150–440)
RBC: 2.48 MIL/uL — ABNORMAL LOW (ref 3.80–5.20)
RDW: 15.2 % — AB (ref 11.5–14.5)
WBC: 9.7 10*3/uL (ref 3.6–11.0)

## 2016-06-29 LAB — VITAMIN B12: VITAMIN B 12: 316 pg/mL (ref 180–914)

## 2016-06-29 LAB — IRON AND TIBC
Iron: 30 ug/dL (ref 28–170)
Saturation Ratios: 14 % (ref 10.4–31.8)
TIBC: 212 ug/dL — ABNORMAL LOW (ref 250–450)
UIBC: 182 ug/dL

## 2016-06-29 LAB — RETICULOCYTES
RBC.: 2.56 MIL/uL — AB (ref 3.80–5.20)
RETIC COUNT ABSOLUTE: 97.3 10*3/uL (ref 19.0–183.0)
RETIC CT PCT: 3.8 % — AB (ref 0.4–3.1)

## 2016-06-29 LAB — TYPE AND SCREEN
ABO/RH(D): O POS
Antibody Screen: NEGATIVE
UNIT DIVISION: 0

## 2016-06-29 LAB — FOLATE: FOLATE: 11.5 ng/mL (ref >5.9)

## 2016-06-29 LAB — PROTIME-INR
INR: 2.1
PROTHROMBIN TIME: 23.9 s — AB (ref 11.4–15.2)

## 2016-06-29 LAB — GLUCOSE, CAPILLARY
GLUCOSE-CAPILLARY: 216 mg/dL — AB (ref 65–99)
Glucose-Capillary: 173 mg/dL — ABNORMAL HIGH (ref 65–99)
Glucose-Capillary: 199 mg/dL — ABNORMAL HIGH (ref 65–99)

## 2016-06-29 MED ORDER — POLYETHYLENE GLYCOL 3350 17 G PO PACK
17.0000 g | PACK | Freq: Every day | ORAL | Status: DC | PRN
  Administered 2016-06-30: 17 g via ORAL
  Filled 2016-06-29 (×2): qty 1

## 2016-06-29 MED ORDER — INSULIN GLARGINE 100 UNIT/ML ~~LOC~~ SOLN
22.0000 [IU] | Freq: Every day | SUBCUTANEOUS | Status: DC
  Administered 2016-06-29: 22 [IU] via SUBCUTANEOUS
  Filled 2016-06-29 (×2): qty 0.22

## 2016-06-29 MED ORDER — INSULIN ASPART 100 UNIT/ML ~~LOC~~ SOLN
3.0000 [IU] | Freq: Three times a day (TID) | SUBCUTANEOUS | Status: DC
  Administered 2016-06-29 – 2016-07-01 (×7): 3 [IU] via SUBCUTANEOUS
  Filled 2016-06-29 (×7): qty 3

## 2016-06-29 MED ORDER — SODIUM CHLORIDE 0.9 % IV SOLN
3.0000 g | Freq: Two times a day (BID) | INTRAVENOUS | Status: DC
  Administered 2016-06-29 – 2016-07-01 (×3): 3 g via INTRAVENOUS
  Filled 2016-06-29 (×5): qty 3

## 2016-06-29 MED ORDER — FUROSEMIDE 20 MG PO TABS
20.0000 mg | ORAL_TABLET | Freq: Every day | ORAL | Status: DC
  Administered 2016-06-29 – 2016-07-01 (×3): 20 mg via ORAL
  Filled 2016-06-29 (×3): qty 1

## 2016-06-29 MED ORDER — FERROUS SULFATE 325 (65 FE) MG PO TABS
325.0000 mg | ORAL_TABLET | Freq: Two times a day (BID) | ORAL | Status: DC
  Administered 2016-06-29 – 2016-07-01 (×5): 325 mg via ORAL
  Filled 2016-06-29 (×5): qty 1

## 2016-06-29 NOTE — NC FL2 (Signed)
[place] LEVEL OF CARE SCREENING TOOL     IDENTIFICATION  Patient Name: Regina [name] Birthdate: [birth year] Sex: female Admission Date (Current Location): 06/26/2016  [place] and [place] Number:  Engineering geologist and Address:  [facility], [address], [place], [place] [address]      Provider Number: [id number]  Attending Physician Name and Address:  Gladstone Lighter, MD  Relative Name and Phone Number:       Current Level of Care: Hospital Recommended Level of Care: [place] Prior Approval Number:    Date Approved/Denied: 04/07/09 PASRR Number: [id number]  Discharge Plan: SNF    Current Diagnoses: Patient Active Problem List   Diagnosis Date Noted  . Cellulitis 06/26/2016  . UTI (lower urinary tract infection) 02/20/2015  . Elevated troponin 02/20/2015  . Hypoglycemia 02/20/2015  . Type 2 diabetes mellitus ([place]) 02/20/2015  . HTN (hypertension) 02/20/2015  . HLD (hyperlipidemia) 02/20/2015  . Paroxysmal atrial fibrillation ([place]) 02/20/2015  . GERD (gastroesophageal reflux disease) 02/20/2015    Orientation RESPIRATION BLADDER Height & Weight     Self, Time, Situation, Place  Normal Continent Weight: 241 lb 11.2 oz (109.6 kg) Height:  4\' 11"  (149.9 cm)  BEHAVIORAL SYMPTOMS/MOOD NEUROLOGICAL BOWEL NUTRITION STATUS      Continent    AMBULATORY STATUS COMMUNICATION OF NEEDS Skin   Extensive Assist Verbally Skin abrasions                       Personal Care Assistance Level of Assistance  Bathing, Dressing, Feeding, Total care Bathing Assistance: Maximum assistance Feeding assistance: Independent Dressing Assistance: Maximum assistance Total Care Assistance: Maximum assistance   Functional Limitations Info             SPECIAL CARE FACTORS FREQUENCY  PT (By licensed PT), OT (By licensed OT)     PT Frequency: up to 5X per [place], 5X per week OT Frequency: up to 5X per [place], 5X per week            Contractures Contractures Info: Present    Additional Factors Info                  Current Medications (06/29/2016):  This is the current hospital active medication list Current Facility-Administered Medications  Medication Dose Route Frequency Provider Last Rate Last Dose  . acetaminophen (TYLENOL) tablet 650 mg  650 mg Oral Q6H PRN Lance Coon, MD       Or  . acetaminophen (TYLENOL) suppository 650 mg  650 mg Rectal Q6H PRN Lance Coon, MD      . Ampicillin-Sulbactam (UNASYN) 3 g in sodium chloride 0.9 % 100 mL IVPB  3 g Intravenous Q6H Gladstone Lighter, MD   3 g at 06/29/16 0606  . feeding supplement (ENSURE ENLIVE) (ENSURE ENLIVE) liquid 237 mL  237 mL Oral BID BM Gladstone Lighter, MD   237 mL at 06/29/16 0952  . ferrous sulfate tablet 325 mg  325 mg Oral BID WC Gladstone Lighter, MD   325 mg at 06/29/16 0848  . furosemide (LASIX) tablet 20 mg  20 mg Oral Daily Gladstone Lighter, MD   20 mg at 06/29/16 0852  . HYDROcodone-acetaminophen (NORCO/VICODIN) 5-325 MG per tablet 1-2 tablet  1-2 tablet Oral Q4H PRN Lance Coon, MD   1 tablet at 06/29/16 [phone]  . insulin aspart (novoLOG) injection 0-5 Units  0-5 Units Subcutaneous QHS Lance Coon, MD   2 Units at 06/28/16 2239  .  insulin aspart (novoLOG) injection 0-9 Units  0-9 Units Subcutaneous TID WC Lance Coon, MD   2 Units at 06/29/16 [phone]  . insulin aspart (novoLOG) injection 3 Units  3 Units Subcutaneous TID WC Gladstone Lighter, MD      . insulin glargine (LANTUS) injection 22 Units  22 Units Subcutaneous QHS Gladstone Lighter, MD      . ipratropium (ATROVENT) 0.03 % nasal spray 2 spray  2 spray Each Nare TID PRN Gladstone Lighter, MD      . levothyroxine (SYNTHROID, LEVOTHROID) tablet 50 mcg  50 mcg Oral QAC breakfast Lance Coon, MD   50 mcg at 06/29/16 0848  . metoprolol tartrate (LOPRESSOR) tablet 25 mg  25 mg Oral BID Gladstone Lighter, MD   25 mg at 06/29/16 1004  . ondansetron (ZOFRAN) tablet 4 mg  4 mg  Oral Q6H PRN Lance Coon, MD       Or  . ondansetron [facility]) injection 4 mg  4 mg Intravenous Q6H PRN Lance Coon, MD   4 mg at 06/28/16 0808  . pantoprazole (PROTONIX) EC tablet 40 mg  40 mg Oral Daily Lance Coon, MD   40 mg at 06/29/16 0850  . paricalcitol (ZEMPLAR) capsule 1 mcg  1 mcg Oral Once per [place] on Mon Wed Fri Lance Coon, MD      . phenol-menthol (CEPASTAT) lozenge 1 lozenge  1 lozenge Buccal PRN Gladstone Lighter, MD      . polyethylene glycol (MIRALAX / GLYCOLAX) packet 17 g  17 g Oral Daily PRN Gladstone Lighter, MD      . ranolazine (RANEXA) 12 hr tablet 500 mg  500 mg Oral BID Lance Coon, MD   500 mg at 06/29/16 0953  . rosuvastatin (CRESTOR) tablet 10 mg  10 mg Oral Daily Lance Coon, MD   10 mg at 06/29/16 0953  . senna-docusate (Senokot-S) tablet 2 tablet  2 tablet Oral QHS Gladstone Lighter, MD   2 tablet at 06/28/16 2238  . sodium chloride flush (NS) 0.9 % injection 3 mL  3 mL Intravenous Q12H Lance Coon, MD   3 mL at 06/29/16 1000     Discharge Medications: Please see discharge summary for a list of discharge medications.  Relevant Imaging Results:  Relevant Lab Results:   Additional Information  SS [id number]  Zettie Pho, LCSW

## 2016-06-29 NOTE — NC FL2 (Deleted)
[place] LEVEL OF CARE SCREENING TOOL     IDENTIFICATION  Patient Name: Regina [name] Birthdate: 01/14/1934 Sex: female Admission Date (Current Location): 06/26/2016  [place] and [place] Number:  Engineering geologist and Address:  [facility], [address], [place], [place] [address]      Provider Number: [id number]  Attending Physician Name and Address:  Gladstone Lighter, MD  Relative Name and Phone Number:       Current Level of Care: Hospital Recommended Level of Care: [place] Prior Approval Number:    Date Approved/Denied: 06/29/16 PASRR Number: [id number]  Discharge Plan: SNF    Current Diagnoses: Patient Active Problem List   Diagnosis Date Noted  . Cellulitis 06/26/2016  . UTI (lower urinary tract infection) 02/20/2015  . Elevated troponin 02/20/2015  . Hypoglycemia 02/20/2015  . Type 2 diabetes mellitus ([place]) 02/20/2015  . HTN (hypertension) 02/20/2015  . HLD (hyperlipidemia) 02/20/2015  . Paroxysmal atrial fibrillation ([place]) 02/20/2015  . GERD (gastroesophageal reflux disease) 02/20/2015    Orientation RESPIRATION BLADDER Height & Weight     Self, Time, Situation, Place  Normal Continent Weight: 241 lb 11.2 oz (109.6 kg) Height:  4\' 11"  (149.9 cm)  BEHAVIORAL SYMPTOMS/MOOD NEUROLOGICAL BOWEL NUTRITION STATUS      Continent    AMBULATORY STATUS COMMUNICATION OF NEEDS Skin   Extensive Assist Verbally Surgical wounds                       Personal Care Assistance Level of Assistance  Bathing, Dressing, Total care, Feeding Bathing Assistance: Maximum assistance Feeding assistance: Independent Dressing Assistance: Maximum assistance Total Care Assistance: Maximum assistance   Functional Limitations Info             SPECIAL CARE FACTORS FREQUENCY  PT (By licensed PT), OT (By licensed OT)     PT Frequency: Up to 5X per [place], 5 days per week OT Frequency: Up to 5X per [place], 5 days  per week            Contractures Contractures Info: Present    Additional Factors Info                  Current Medications (06/29/2016):  This is the current hospital active medication list Current Facility-Administered Medications  Medication Dose Route Frequency Provider Last Rate Last Dose  . acetaminophen (TYLENOL) tablet 650 mg  650 mg Oral Q6H PRN Lance Coon, MD       Or  . acetaminophen (TYLENOL) suppository 650 mg  650 mg Rectal Q6H PRN Lance Coon, MD      . Derrill Memo ON 06/30/2016] Ampicillin-Sulbactam (UNASYN) 3 g in sodium chloride 0.9 % 100 mL IVPB  3 g Intravenous Q12H Gladstone Lighter, MD      . feeding supplement (ENSURE ENLIVE) (ENSURE ENLIVE) liquid 237 mL  237 mL Oral BID BM Gladstone Lighter, MD   237 mL at 06/29/16 1400  . ferrous sulfate tablet 325 mg  325 mg Oral BID WC Gladstone Lighter, MD   325 mg at 06/29/16 0848  . furosemide (LASIX) tablet 20 mg  20 mg Oral Daily Gladstone Lighter, MD   20 mg at 06/29/16 0852  . HYDROcodone-acetaminophen (NORCO/VICODIN) 5-325 MG per tablet 1-2 tablet  1-2 tablet Oral Q4H PRN Lance Coon, MD   1 tablet at 06/29/16 [phone]  . insulin aspart (novoLOG) injection 0-5 Units  0-5 Units Subcutaneous QHS Lance Coon, MD   2 Units  at 06/28/16 2239  . insulin aspart (novoLOG) injection 0-9 Units  0-9 Units Subcutaneous TID WC Lance Coon, MD   2 Units at 06/29/16 1214  . insulin aspart (novoLOG) injection 3 Units  3 Units Subcutaneous TID WC Gladstone Lighter, MD   3 Units at 06/29/16 1213  . insulin glargine (LANTUS) injection 22 Units  22 Units Subcutaneous QHS Gladstone Lighter, MD      . ipratropium (ATROVENT) 0.03 % nasal spray 2 spray  2 spray Each Nare TID PRN Gladstone Lighter, MD      . levothyroxine (SYNTHROID, LEVOTHROID) tablet 50 mcg  50 mcg Oral QAC breakfast Lance Coon, MD   50 mcg at 06/29/16 0848  . metoprolol tartrate (LOPRESSOR) tablet 25 mg  25 mg Oral BID Gladstone Lighter, MD   25 mg at 06/29/16 1004  .  ondansetron (ZOFRAN) tablet 4 mg  4 mg Oral Q6H PRN Lance Coon, MD       Or  . ondansetron [facility]) injection 4 mg  4 mg Intravenous Q6H PRN Lance Coon, MD   4 mg at 06/28/16 0808  . pantoprazole (PROTONIX) EC tablet 40 mg  40 mg Oral Daily Lance Coon, MD   40 mg at 06/29/16 0850  . paricalcitol (ZEMPLAR) capsule 1 mcg  1 mcg Oral Once per [place] on Mon Wed Fri Lance Coon, MD      . phenol-menthol (CEPASTAT) lozenge 1 lozenge  1 lozenge Buccal PRN Gladstone Lighter, MD      . polyethylene glycol (MIRALAX / GLYCOLAX) packet 17 g  17 g Oral Daily PRN Gladstone Lighter, MD      . rosuvastatin (CRESTOR) tablet 10 mg  10 mg Oral Daily Lance Coon, MD   10 mg at 06/29/16 0953  . senna-docusate (Senokot-S) tablet 2 tablet  2 tablet Oral QHS Gladstone Lighter, MD   2 tablet at 06/28/16 2238  . sodium chloride flush (NS) 0.9 % injection 3 mL  3 mL Intravenous Q12H Lance Coon, MD   3 mL at 06/29/16 1000     Discharge Medications: Please see discharge summary for a list of discharge medications.  Relevant Imaging Results:  Relevant Lab Results:   Additional Information  SS  [id number]  Zettie Pho, LCSW

## 2016-06-29 NOTE — Progress Notes (Signed)
[place] at [place] NAME: Regina [name]    MR#:  [id number]  DATE OF BIRTH:  [birth year]  SUBJECTIVE:  CHIEF COMPLAINT:   Chief Complaint  Patient presents with  . Generalized Body Aches   - Received 1 unit packed RBC transfusion yesterday. Right leg is still swollen. Physical therapy recommended rehabilitation.  REVIEW OF SYSTEMS:  Review of Systems  Constitutional: Positive for malaise/fatigue. Negative for chills, fever and weight loss.  HENT: Positive for sore throat. Negative for ear discharge, ear pain, hearing loss and nosebleeds.   Eyes: Negative for blurred vision, double vision and photophobia.  Respiratory: Negative for cough, hemoptysis, shortness of breath and wheezing.   Cardiovascular: Positive for orthopnea and leg swelling. Negative for chest pain and palpitations.  Gastrointestinal: Negative for abdominal pain, constipation, diarrhea, melena, nausea and vomiting.  Genitourinary: Negative for dysuria, frequency, hematuria and urgency.  Musculoskeletal: Positive for back pain. Negative for myalgias and neck pain.  Skin: Negative for rash.  Neurological: Negative for dizziness, sensory change, speech change, focal weakness and headaches.  Endo/Heme/Allergies: Does not bruise/bleed easily.  Psychiatric/Behavioral: Negative for depression. The patient is not nervous/anxious.     DRUG ALLERGIES:  No Known Allergies  VITALS:  Blood pressure (!) 114/46, pulse 82, temperature 98.1 F (36.7 C), temperature source Oral, resp. rate 20, height 4\' 11"  (1.499 m), weight 109.6 kg (241 lb 11.2 oz), SpO2 93 %.  PHYSICAL EXAMINATION:  Physical Exam  GENERAL:  82 y.o.-year-old patient lying in the bed with no acute distress.  EYES: Pupils equal, round, reactive to light and accommodation. No scleral icterus. Extraocular muscles intact.  HEENT: Head atraumatic, normocephalic. Oropharynx and nasopharynx clear.  NECK:  Supple, no  jugular venous distention. No thyroid enlargement, no tenderness.  LUNGS: Normal breath sounds bilaterally, no wheezing, rales,rhonchi or crepitation. No use of accessory muscles of respiration. Decreased bibasilar breath sounds CARDIOVASCULAR: S1, S2 normal. No rubs, or gallops. 2/6 systolic murmur present ABDOMEN: Soft, nontender, nondistended. Bowel sounds present. No organomegaly or mass.  EXTREMITIES: No cyanosis, or clubbing. 1+ LLE edema, 2+ RLE edema, echhymotic right leg NEUROLOGIC: Cranial nerves II through XII are intact. Muscle strength 5/5 in all extremities. Sensation intact. Gait not checked.  PSYCHIATRIC: The patient is alert and oriented x 3. More calm today SKIN: No obvious rash, lesion, or ulcer.    LABORATORY PANEL:   CBC  Recent Labs Lab 06/29/16 0447  WBC 9.7  HGB 7.9*  HCT 22.6*  PLT 324   ------------------------------------------------------------------------------------------------------------------  Chemistries   Recent Labs Lab 06/29/16 0447  NA 131*  K 4.5  CL 103  CO2 22  GLUCOSE 237*  BUN 46*  CREATININE 1.99*  CALCIUM 7.8*   ------------------------------------------------------------------------------------------------------------------  Cardiac Enzymes No results for input(s): TROPONINI in the last 168 hours. ------------------------------------------------------------------------------------------------------------------  RADIOLOGY:  Ct Tibia Fibula Right W Contrast  Result Date: 06/27/2016 CLINICAL DATA:  Fever, right lower extremity pain, erythema, and warmth. EXAM: CT OF THE RIGHT TIBIA AND FIBULA WITH CONTRAST TECHNIQUE: Contiguous axial CT images were obtained through the right lower leg and multiplanar reconstruction performed. CONTRAST:  21mL ISOVUE-300 IOPAMIDOL (ISOVUE-300) INJECTION 61%. The patient's initial IV injection extravasated, 10 cc of Isovue-300. COMPARISON:  None FINDINGS: Osteoarthritis of the knee most severely  involving the patellofemoral joint and medial compartment. There is diffuse subcutaneous edema in the lower calf extending down into the ankle. This circumferential edema becomes less severe in the upper calf, although there is some  stranding and edema posteriorly in the upper calf which extends posterior to the knee. Edema tracks in Kager's fat pad. We do not demonstrate significant edema within the muscular compartments of the knee. There is fatty atrophy of portions of the medial head gastrocnemius, soleus, and flexor digitorum longus muscles especially in the mid calf region as shown on image 66/7. Considerable arterial atherosclerotic vascular calcification is present. No appreciable venous occlusion. Chondral thinning at the tibiotalar articulation. No abscess identified. IMPRESSION: 1. Diffuse subcutaneous edema in the lower leg, especially in the vicinity of the ankle but also in the upper calf. There are many potential causes including venous insufficiency, poor upstream lymphatic drainage, and cellulitis. No abscess or findings of osteomyelitis. 2. Chronic appearing focal fatty atrophy of portions of the calf and flexor musculature as noted above. 3. Considerable osteoarthritis of the knee. 4. Atherosclerosis. Electronically Signed   By: Van Clines M.D.   On: 06/27/2016 16:49    EKG:   Orders placed or performed during the hospital encounter of 02/19/15  . ED EKG  . ED EKG  . Repeat EKG  . Repeat EKG  . EKG 12-Lead  . EKG 12-Lead  . EKG 12-Lead  . EKG 12-Lead  . EKG    ASSESSMENT AND PLAN:   82 year old female with past medical history significant for breast cancer status post left mastectomy, CAD status post stents, hypertension, diabetes mellitus, hyperlipidemia, paroxysmal atrial fibrillation on Coumadin presents to hospital secondary to worsening right lower extremity swelling.  #1 right lower extremity cellulitis-CT of the leg showing no compartment syndrome, has pulses.  Extensive pain noted. -Will not tolerate compression stockings. We will keep her leg elevated. -Changed Ancef to Unasyn. -Discontinued Coumadin. Pain meds prn -Dopplers negative for DVT  #2 paroxysmal atrial fibrillation-rate controlled. Change metoprolol to short acting and decrease the dose as blood pressure is borderline. -Discontinue Coumadin at this time. The patient was on aspirin prior to starting on Coumadin. At this time will hold both due to low blood counts.  #3 acute on chronic anemia-had supratherapeutic INR last week. No active bleeding. The right leg is bruised. -INR is at 2.1. Discontinue Coumadin at this time -1 unit prbc Tx given yesterday for symptomatic anemia and hb at 7.2, hb now at 7.9 No active bleeding. Anemia labs pending. Start oral iron  #4 CKD-creatinine stable around 1.4. Monitor at this time. -Hold Bumex for now. Start lasix at a lower dose  #5 hypertension-hold lisinopril and Bumex. DC fluids. Start low dose lasix -Change metoprolol to short acting and lower dose due to low normal blood pressure  #6 diabetes mellitus-on Lantus and sliding scale insulin. Will continue. lantus dose increased  #7 DVT prophylaxis-Coumadin has been discontinued. Monitor for now Start   Physical therapy consulted. Recommended SNF  -Encourage incentive spirometry -Social worker consulted for the same   All the records are reviewed and case discussed with Care Management/Social Workerr. Management plans discussed with the patient, family and they are in agreement.  CODE STATUS: Full Code  TOTAL TIME TAKING CARE OF THIS PATIENT: 36 minutes.   POSSIBLE D/C IN 2 DAYS, DEPENDING ON CLINICAL CONDITION.   Gladstone Lighter M.D on 06/29/2016 at 8:24 AM  Between 7am to 6pm - Pager - [phone]  After 6pm go to [email or web address] - password [place] [facility]  Office  [phone]  CC: Primary care physician; Donnie Coffin, MD

## 2016-06-29 NOTE — Progress Notes (Signed)
Pharmacy Antibiotic Note  Regina [name] is a 82 y.o. female admitted on 06/26/2016 with cellulitis.  Pharmacy has been consulted for unasyn dosing.   Plan:  Change to Ampicillin/sulbactam 3gm IV Q12H  Height: 4\' 11"  (149.9 cm) Weight: 241 lb 11.2 oz (109.6 kg) IBW/kg (Calculated) : 43.2  Temp (24hrs), Avg:98.4 F (36.9 C), Min:98.1 F (36.7 C), Max:98.7 F (37.1 C)   Recent Labs Lab 06/26/16 1938 06/26/16 1939 06/27/16 0439 06/28/16 0851 06/29/16 0447  WBC 11.1*  --  11.8* 10.6 9.7  CREATININE 1.39*  --  1.47*  --  1.99*  LATICACIDVEN  --  2.4* 0.9  --   --     Estimated Creatinine Clearance: 24.8 mL/min (by C-G formula based on SCr of 1.99 mg/dL (H)).    No Known Allergies  Antimicrobials this admission: Cefazolin 11/15>>11/17 Unasyn 11/17 >>  Dose adjustments this admission:   Microbiology results:   Thank you for allowing pharmacy to be a part of this patient's care.  Maron Stanzione D 06/29/2016 1:25 PM

## 2016-06-29 NOTE — Clinical Social Work Note (Signed)
Clinical Social Work Assessment  Patient Details  Name: Regina [name] MRN: [id number] Date of Birth: 1934-03-09  Date of referral:  06/29/16               Reason for consult:  Facility Placement                Permission sought to share information with:  Chartered certified accountant granted to share information::  Yes, Verbal Permission Granted  Name::        Agency::     Relationship::     Contact Information:     Housing/Transportation Living arrangements for the past 2 months:  Single Family Home Source of Information:  Patient Patient Interpreter Needed:  None Criminal Activity/Legal Involvement Pertinent to Current Situation/Hospitalization:  No - Comment as needed Significant Relationships:  Adult Children Lives with:  Self Do you feel safe going back to the place where you live?  Yes Need for family participation in patient care:  No (Coment)  Care giving concerns:  STR   Social Worker assessment / plan:  The CSW met with the patient at bedside to discuss dc planning. The patient gave verbal consent for the CSW to conduct a bed search for STR. She indicated that she would discuss the SNFs listed within a 25 mile radius provided by the CSW with her daughter.  At baseline, the patient lives alone, but she has assistance from her son and daughter. She has a 4W rolling walker with a seat, a 2W rolling walker, and 2 canes. She is independent with ADLs and IADLs and is continent of both bladder and bowel.  PT rec is for SNF level of care.  Employment status:  Retired Nurse, adult PT Recommendations:  [place] / Referral to community resources:  [place]  Patient/Family's Response to care:  Patient thanked CSW for assistance.  Patient/Family's Understanding of and Emotional Response to Diagnosis, Current Treatment, and Prognosis:  Patient understood and was in agreement with the proposed  level of care.  Emotional Assessment Appearance:  Appears stated age Attitude/Demeanor/Rapport:   (Pleasant and cooperative) Affect (typically observed):  Accepting, Appropriate Orientation:  Oriented to Self, Oriented to Place, Oriented to Situation, Oriented to  Time Alcohol / Substance use:  Never Used Psych involvement (Current and /or in the community):  No (Comment)  Discharge Needs  Concerns to be addressed:  Discharge Planning Concerns Readmission within the last 30 days:  No Current discharge risk:  None Barriers to Discharge:  Continued Medical Work up   [employer], LCSW 06/29/2016, 2:39 PM

## 2016-06-30 ENCOUNTER — Inpatient Hospital Stay: Payer: Medicare Other

## 2016-06-30 LAB — GLUCOSE, CAPILLARY
GLUCOSE-CAPILLARY: 253 mg/dL — AB (ref 65–99)
Glucose-Capillary: 181 mg/dL — ABNORMAL HIGH (ref 65–99)
Glucose-Capillary: 225 mg/dL — ABNORMAL HIGH (ref 65–99)
Glucose-Capillary: 202 mg/dL — ABNORMAL HIGH (ref 65–99)
Glucose-Capillary: 295 mg/dL — ABNORMAL HIGH (ref 65–99)

## 2016-06-30 LAB — CBC
HEMATOCRIT: 23.4 % — AB (ref 35.0–47.0)
HEMOGLOBIN: 8.1 g/dL — AB (ref 12.0–16.0)
MCH: 30.7 pg (ref 26.0–34.0)
MCHC: 34.4 g/dL (ref 32.0–36.0)
MCV: 89.3 fL (ref 80.0–100.0)
Platelets: 368 10*3/uL (ref 150–440)
RBC: 2.62 MIL/uL — ABNORMAL LOW (ref 3.80–5.20)
RDW: 15.1 % — ABNORMAL HIGH (ref 11.5–14.5)
WBC: 9.2 10*3/uL (ref 3.6–11.0)

## 2016-06-30 LAB — BASIC METABOLIC PANEL
ANION GAP: 7 (ref 5–15)
BUN: 49 mg/dL — ABNORMAL HIGH (ref 6–20)
CO2: 22 mmol/L (ref 22–32)
Calcium: 8.1 mg/dL — ABNORMAL LOW (ref 8.9–10.3)
Chloride: 103 mmol/L (ref 101–111)
Creatinine, Ser: 2.18 mg/dL — ABNORMAL HIGH (ref 0.44–1.00)
GFR calc Af Amer: 23 mL/min — ABNORMAL LOW (ref >60)
GFR, EST NON AFRICAN AMERICAN: 20 mL/min — AB (ref >60)
GLUCOSE: 226 mg/dL — AB (ref 65–99)
POTASSIUM: 5 mmol/L (ref 3.5–5.1)
Sodium: 132 mmol/L — ABNORMAL LOW (ref 135–145)

## 2016-06-30 LAB — PROTIME-INR
INR: 1.81
Prothrombin Time: 21.2 seconds — ABNORMAL HIGH (ref 11.4–15.2)

## 2016-06-30 MED ORDER — HEPARIN SODIUM (PORCINE) 5000 UNIT/ML IJ SOLN
5000.0000 [IU] | Freq: Three times a day (TID) | INTRAMUSCULAR | Status: DC
  Administered 2016-06-30 (×2): 5000 [IU] via SUBCUTANEOUS
  Filled 2016-06-30 (×2): qty 1

## 2016-06-30 MED ORDER — INSULIN GLARGINE 100 UNIT/ML ~~LOC~~ SOLN
30.0000 [IU] | Freq: Every day | SUBCUTANEOUS | Status: DC
  Administered 2016-06-30: 30 [IU] via SUBCUTANEOUS
  Filled 2016-06-30 (×2): qty 0.3

## 2016-06-30 MED ORDER — MOMETASONE FURO-FORMOTEROL FUM 200-5 MCG/ACT IN AERO
2.0000 | INHALATION_SPRAY | Freq: Two times a day (BID) | RESPIRATORY_TRACT | Status: DC
  Administered 2016-06-30: 2 via RESPIRATORY_TRACT
  Filled 2016-06-30: qty 8.8

## 2016-06-30 MED ORDER — NYSTATIN 100000 UNIT/ML MT SUSP
5.0000 mL | Freq: Four times a day (QID) | OROMUCOSAL | Status: DC
  Administered 2016-06-30 (×3): 500000 [IU] via OROMUCOSAL
  Filled 2016-06-30 (×4): qty 5

## 2016-06-30 MED ORDER — BISACODYL 5 MG PO TBEC
10.0000 mg | DELAYED_RELEASE_TABLET | Freq: Every day | ORAL | Status: DC
  Administered 2016-06-30: 10 mg via ORAL
  Filled 2016-06-30: qty 2

## 2016-06-30 NOTE — Progress Notes (Signed)
[place] at [place] NAME: Regina [name]    MR#:  [id number]  DATE OF BIRTH:  [birth year]  SUBJECTIVE:  CHIEF COMPLAINT:   Chief Complaint  Patient presents with  . Generalized Body Aches   - Complains of difficulty swallowing and lack of taste in the mouth. -Right leg swelling is slowly improving. PT recommended rehabilitation.  REVIEW OF SYSTEMS:  Review of Systems  Constitutional: Positive for malaise/fatigue. Negative for chills, fever and weight loss.  HENT: Positive for sore throat. Negative for ear discharge, ear pain, hearing loss and nosebleeds.   Eyes: Negative for blurred vision, double vision and photophobia.  Respiratory: Negative for cough, hemoptysis, shortness of breath and wheezing.   Cardiovascular: Positive for orthopnea and leg swelling. Negative for chest pain and palpitations.  Gastrointestinal: Negative for abdominal pain, constipation, diarrhea, melena, nausea and vomiting.  Genitourinary: Negative for dysuria, frequency, hematuria and urgency.  Musculoskeletal: Positive for back pain. Negative for myalgias and neck pain.  Skin: Negative for rash.  Neurological: Negative for dizziness, sensory change, speech change, focal weakness and headaches.  Endo/Heme/Allergies: Does not bruise/bleed easily.  Psychiatric/Behavioral: Negative for depression. The patient is not nervous/anxious.     DRUG ALLERGIES:  No Known Allergies  VITALS:  Blood pressure (!) 143/38, pulse 72, temperature 98 F (36.7 C), temperature source Oral, resp. rate 18, height 4\' 11"  (1.499 m), weight 109.6 kg (241 lb 11.2 oz), SpO2 91 %.  PHYSICAL EXAMINATION:  Physical Exam  GENERAL:  82 y.o.-year-old patient lying in the bed with no acute distress.  EYES: Pupils equal, round, reactive to light and accommodation. No scleral icterus. Extraocular muscles intact.  HEENT: Head atraumatic, normocephalic. Oropharynx and nasopharynx clear.  NECK:   Supple, no jugular venous distention. No thyroid enlargement, no tenderness.  LUNGS: Normal breath sounds bilaterally, no wheezing, rales,rhonchi or crepitation. No use of accessory muscles of respiration. Decreased bibasilar breath sounds, occasional scattered wheeze noted as well. CARDIOVASCULAR: S1, S2 normal. No rubs, or gallops. 2/6 systolic murmur present ABDOMEN: Soft, nontender, nondistended. Bowel sounds present. No organomegaly or mass.  EXTREMITIES: No cyanosis, or clubbing. 1+ LLE edema, 2+ RLE edema, echhymotic right leg NEUROLOGIC: Cranial nerves II through XII are intact. Muscle strength 5/5 in all extremities. Sensation intact. Gait not checked.  PSYCHIATRIC: The patient is alert and oriented x 3. More calm today, but can get anxious easy SKIN: No obvious rash, lesion, or ulcer.    LABORATORY PANEL:   CBC  Recent Labs Lab 06/30/16 0649  WBC 9.2  HGB 8.1*  HCT 23.4*  PLT 368   ------------------------------------------------------------------------------------------------------------------  Chemistries   Recent Labs Lab 06/30/16 0649  NA 132*  K 5.0  CL 103  CO2 22  GLUCOSE 226*  BUN 49*  CREATININE 2.18*  CALCIUM 8.1*   ------------------------------------------------------------------------------------------------------------------  Cardiac Enzymes No results for input(s): TROPONINI in the last 168 hours. ------------------------------------------------------------------------------------------------------------------  RADIOLOGY:  Dg Chest 2 View  Result Date: 06/30/2016 CLINICAL DATA:  Shortness of Breath. EXAM: CHEST  2 VIEW COMPARISON:  02/19/2015 FINDINGS: Left pacer remains in place, unchanged. Heart is borderline in size. No confluent airspace opacities or effusions. No acute bony abnormality. IMPRESSION: No active cardiopulmonary disease. Electronically Signed   By: Rolm Baptise M.D.   On: 06/30/2016 08:29    EKG:   Orders placed or  performed during the hospital encounter of 02/19/15  . ED EKG  . ED EKG  . Repeat EKG  . Repeat EKG  .  EKG 12-Lead  . EKG 12-Lead  . EKG 12-Lead  . EKG 12-Lead  . EKG    ASSESSMENT AND PLAN:   82 year old female with past medical history significant for breast cancer status post left mastectomy, CAD status post stents, hypertension, diabetes mellitus, hyperlipidemia, paroxysmal atrial fibrillation on Coumadin presents to hospital secondary to worsening right lower extremity swelling.  #1 right lower extremity cellulitis-CT of the leg showing no compartment syndrome, has pulses. Significant pain noted. -Can not tolerate compression stockings. We will keep her leg elevated. Slowly improving now -Changed Ancef to Unasyn. -Discontinued Coumadin. Pain meds prn -Dopplers negative for DVT  #2 paroxysmal atrial fibrillation-rate controlled. Changed metoprolol to short acting and decrease the dose as blood pressure is borderline. -Discontinue Coumadin at this time. The patient was on aspirin prior to starting on Coumadin. At this time will hold both due to low blood counts.  #3 acute on chronic anemia-had supratherapeutic INR last week. No active bleeding. The right leg is bruised. -Discontinue Coumadin at this time -1 unit prbc Tx given yesterday for symptomatic anemia and hb at 7.2, hb now at 7.9 No active bleeding. Anemia labs with low iron. Started oral iron -No transfusion if hemoglobin is at 7 or greater  #4 CKD-some acute renal failure noted. Started on low-dose Lasix in the hospital. Continue for today and check labs tomorrow - baseline creatinine around 1.4. Monitor at this time. -Hold Bumex for now. Started lasix at a lower dose - f/u CXR today  #5 hypertension-hold lisinopril and Bumex. Patient has hyperkalemia even without ACEI here -DC fluids. Started low dose lasix -Changed metoprolol to short acting and lower dose due to low normal blood pressure  #6 diabetes mellitus-on  Lantus and sliding scale insulin. Will continue. lantus dose increased  #7 DVT prophylaxis-Coumadin has been discontinued. Monitor for now Start SQ heparin  #8 complaining of sore throat, no oral thrush seen, will start nystatin for any esophageal candidiasis    Physical therapy consulted. Recommended SNF  -Encourage incentive spirometry -Social worker consulted for the same Possible discharge tomorrow if stable   All the records are reviewed and case discussed with Care Management/Social Workerr. Management plans discussed with the patient, family and they are in agreement.  CODE STATUS: Full Code  TOTAL TIME TAKING CARE OF THIS PATIENT: 36 minutes.   POSSIBLE D/C TOMORROW, DEPENDING ON CLINICAL CONDITION.   Gladstone Lighter M.D on 06/30/2016 at 9:26 AM  Between 7am to 6pm - Pager - [phone]  After 6pm go to [email or web address] - password [facility]  Office  [phone]  CC: Primary care physician; Donnie Coffin, MD

## 2016-07-01 LAB — BASIC METABOLIC PANEL
ANION GAP: 7 (ref 5–15)
BUN: 47 mg/dL — ABNORMAL HIGH (ref 6–20)
CO2: 22 mmol/L (ref 22–32)
Calcium: 8.1 mg/dL — ABNORMAL LOW (ref 8.9–10.3)
Chloride: 104 mmol/L (ref 101–111)
Creatinine, Ser: 1.99 mg/dL — ABNORMAL HIGH (ref 0.44–1.00)
GFR calc Af Amer: 26 mL/min — ABNORMAL LOW (ref >60)
GFR, EST NON AFRICAN AMERICAN: 23 mL/min — AB (ref >60)
GLUCOSE: 296 mg/dL — AB (ref 65–99)
POTASSIUM: 4.5 mmol/L (ref 3.5–5.1)
Sodium: 133 mmol/L — ABNORMAL LOW (ref 135–145)

## 2016-07-01 LAB — PROTIME-INR
INR: 1.83
Prothrombin Time: 21.4 seconds — ABNORMAL HIGH (ref 11.4–15.2)

## 2016-07-01 LAB — GLUCOSE, CAPILLARY
GLUCOSE-CAPILLARY: 276 mg/dL — AB (ref 65–99)
GLUCOSE-CAPILLARY: 310 mg/dL — AB (ref 65–99)

## 2016-07-01 MED ORDER — POLYETHYLENE GLYCOL 3350 17 G PO PACK: 17.0000 g | PACK | Freq: Every day | ORAL | 0 refills | Status: DC | PRN

## 2016-07-01 MED ORDER — HYDROCODONE-ACETAMINOPHEN 5-325 MG PO TABS: 1.0000 | ORAL_TABLET | Freq: Four times a day (QID) | ORAL | 0 refills | Status: DC | PRN

## 2016-07-01 MED ORDER — AMOXICILLIN-POT CLAVULANATE 250-125 MG PO TABS
1.0000 | ORAL_TABLET | Freq: Two times a day (BID) | ORAL | Status: DC
  Administered 2016-07-01: 1 via ORAL
  Filled 2016-07-01 (×2): qty 1

## 2016-07-01 MED ORDER — FERROUS SULFATE 325 (65 FE) MG PO TABS: 325.0000 mg | ORAL_TABLET | Freq: Two times a day (BID) | ORAL | 3 refills | Status: DC

## 2016-07-01 MED ORDER — FUROSEMIDE 20 MG PO TABS: 20.0000 mg | ORAL_TABLET | Freq: Every day | ORAL | Status: DC

## 2016-07-01 MED ORDER — METOPROLOL TARTRATE 25 MG PO TABS
25.0000 mg | ORAL_TABLET | Freq: Two times a day (BID) | ORAL | Status: DC
Start: 2016-07-01 — End: 2019-04-14

## 2016-07-01 MED ORDER — AMOXICILLIN-POT CLAVULANATE 250-125 MG PO TABS: 1.0000 | ORAL_TABLET | Freq: Two times a day (BID) | ORAL | 0 refills | Status: DC

## 2016-07-01 NOTE — Progress Notes (Signed)
Pt d/c to [facility] (via EMS transport) today for rehab treatment.  IV removed intact. D/C paperwork reviewed and education provided with all questions and concerns addressed.  Pt daughter Vaughan Basta at bedside.

## 2016-07-01 NOTE — Progress Notes (Signed)
Spoke with [place], [facility] rep at [phone], to notify of non-emergent EMS transport.  Auth notification reference given as [id number].   Service date range good for 90 days starting from 07/01/16.   Geographical gap exception requested to determine if services can be considered at an in-network level.

## 2016-07-01 NOTE — Clinical Social Work Note (Signed)
Patient and daughter have chosen [facility]. Discharge information sent. Regina [name] is aware. Nurse to call report and patient requests going by EMS even if her insurance does not cover it. Shela Leff MSW,LCSW [phone]

## 2016-07-01 NOTE — Progress Notes (Signed)
Pt sitting up but feeling some discomofrt. Daughter usually in room but currently downstairs.    07/01/16 1045  Clinical Encounter Type  Visited With Patient  Visit Type Initial  Referral From Nurse  Spiritual Encounters  Spiritual Needs Emotional  Stress Factors  Patient Stress Factors Health changes

## 2016-07-01 NOTE — Care Management Important Message (Signed)
Important Message  Patient Details  Name: Regina [name] MRN: [id number] Date of Birth: [birth year]   Medicare Important Message Given:  Yes    Beverly Sessions, RN 07/01/2016, 10:44 AM

## 2016-07-01 NOTE — Discharge Summary (Signed)
[place] [facility] [place] NAME: Regina [name]    MR#:  [id number]  DATE OF BIRTH:  06/09/1934  DATE OF ADMISSION:  06/26/2016   ADMITTING PHYSICIAN: Lance Coon, MD  DATE OF DISCHARGE:  07/01/2016 PRIMARY CARE PHYSICIAN: Donnie Coffin, MD   ADMISSION DIAGNOSIS:  Cellulitis of right lower extremity [L03.115] DISCHARGE DIAGNOSIS:  Principal Problem:   Cellulitis Active Problems:   Type 2 diabetes mellitus (HCC)   HTN (hypertension)   HLD (hyperlipidemia)   Paroxysmal atrial fibrillation (HCC)   GERD (gastroesophageal reflux disease)  SECONDARY DIAGNOSIS:   Past Medical History:  Diagnosis Date  . Breast cancer, left [facility]) 2013   Mastectomy.   . CAD (coronary artery disease)    s/p cath in 2009 with 2 stents  . Diabetes mellitus without complication ([place])   . GERD (gastroesophageal reflux disease)   . HLD (hyperlipidemia)   . Hypertension   . Hypothyroidism   . Paroxysmal atrial fibrillation [facility])    HOSPITAL COURSE:  82 year old female with past medical history significant for breast cancer status post left mastectomy, CAD status post stents, hypertension, diabetes mellitus, hyperlipidemia, paroxysmal atrial fibrillation on Coumadin presents to hospital secondary to worsening right lower extremity swelling.  #1 right lower extremity cellulitis-CT of the leg showing no compartment syndrome, has pulses. Significant pain noted. -Can not tolerate compression stockings. Keep her leg elevated.  improving. -Changed Ancef to Unasyn. Will change to augmentin for 1 more week. -Discontinued Coumadin due to decreased Hb. Pain meds prn -Dopplers negative for DVT  #2 paroxysmal atrial fibrillation-rate controlled. Changed metoprolol to short acting and decrease the dose as blood pressure is borderline. -Discontinued Coumadin due to decreased Hb. The patient was on aspirin prior to starting on Coumadin. No active bleeding, Hb is stable after  1 unit PRBC transfusion. She was on coumadin 2.5 mg daily, but increased to 5 mg daily by Dr. Lavera Guise per pt's daughter.  Due to high risk for CVA, will resume coumadin 2.5 mg HS, follow up INR and Dr. Lavera Guise.  #3 acute on chronic anemia-had supratherapeutic INR last week. No active bleeding. The right leg is bruised. -Discontinued Coumadin. -1 unit prbc Tx given yesterday for symptomatic anemia and hb at 7.2, hb today is 8.1. No active bleeding. Anemia labs with low iron. Started oral iron  #4 CKD-some acute renal failure noted. Started on low-dose Lasix in the hospital. - baseline creatinine around 1.4, up to 1.99 and stable. -Hold Bumex and continue lasix at a lower dose CXR: no acute changes.  #5 hypertension-hold lisinopril and Bumex. Patient has hyperkalemia even without ACEI here -DC fluids. Started low dose lasix -Changed metoprolol to short acting and lower dose due to low normal blood pressure  #6 diabetes mellitus-on Lantus and sliding scale insulin.  Resume insulin aspart protamine- aspart (70-30) after discharge.  #7 DVT prophylaxis-Coumadin has been discontinued. Resume.  #8 complaining of sore throat, no oral thrush seen,  started nystatin for any esophageal candidiasis    Physical therapy consulted. Recommended SNF   DISCHARGE CONDITIONS:  Stable, discharge to SNF today. CONSULTS OBTAINED:   DRUG ALLERGIES:  No Known Allergies DISCHARGE MEDICATIONS:     Medication List    STOP taking these medications   bumetanide 2 MG tablet Commonly known as:  BUMEX   clopidogrel 75 MG tablet Commonly known as:  PLAVIX   lisinopril 20 MG tablet Commonly known as:  PRINIVIL,ZESTRIL   metoprolol succinate 50 MG 24  hr tablet Commonly known as:  TOPROL-XL     TAKE these medications   acetaminophen 325 MG tablet Commonly known as:  TYLENOL Take 2 tablets (650 mg total) by mouth every 6 (six) hours as needed for mild pain (or Fever >/= 101).   acidophilus Caps  capsule Take 1 capsule by mouth daily.   amoxicillin-clavulanate 250-125 MG tablet Commonly known as:  AUGMENTIN Take 1 tablet by mouth every 12 (twelve) hours.   esomeprazole 40 MG capsule Commonly known as:  NEXIUM Take 40 mg by mouth daily.   ferrous sulfate 325 (65 FE) MG tablet Take 1 tablet (325 mg total) by mouth 2 (two) times daily with a meal.   furosemide 20 MG tablet Commonly known as:  LASIX Take 1 tablet (20 mg total) by mouth daily.   HYDROcodone-acetaminophen 5-325 MG tablet Commonly known as:  NORCO/VICODIN Take 1 tablet by mouth every 6 (six) hours as needed for moderate pain.   insulin aspart protamine- aspart (70-30) 100 UNIT/ML injection Commonly known as:  NOVOLOG MIX 70/30 Inject 0.28 mLs (28 Units total) into the skin 2 (two) times daily with a meal. What changed:  how much to take  when to take this   levothyroxine 50 MCG tablet Commonly known as:  SYNTHROID, LEVOTHROID Take 50 mcg by mouth daily.   metoprolol tartrate 25 MG tablet Commonly known as:  LOPRESSOR Take 1 tablet (25 mg total) by mouth 2 (two) times daily.   paricalcitol 1 MCG capsule Commonly known as:  ZEMPLAR Take 1 mcg by mouth 3 (three) times a week. Pt takes on Monday, Wednesday, and Friday.   polyethylene glycol packet Commonly known as:  MIRALAX / GLYCOLAX Take 17 g by mouth daily as needed for mild constipation.   ranolazine 500 MG 12 hr tablet Commonly known as:  RANEXA Take 500 mg by mouth 2 (two) times daily.   rosuvastatin 10 MG tablet Commonly known as:  CRESTOR Take 10 mg by mouth daily.   warfarin 2.5 MG tablet Commonly known as:  COUMADIN Take 2.5 mg by mouth daily.        DISCHARGE INSTRUCTIONS:  See AVS.  If you experience worsening of your admission symptoms, develop shortness of breath, life threatening emergency, suicidal or homicidal thoughts you must seek medical attention immediately by calling 911 or calling your MD immediately  if symptoms  less severe.  You Must read complete instructions/literature along with all the possible adverse reactions/side effects for all the Medicines you take and that have been prescribed to you. Take any new Medicines after you have completely understood and accpet all the possible adverse reactions/side effects.   Please note  You were cared for by a hospitalist during your hospital stay. If you have any questions about your discharge medications or the care you received while you were in the hospital after you are discharged, you can call the unit and asked to speak with the hospitalist on call if the hospitalist that took care of you is not available. Once you are discharged, your primary care physician will handle any further medical issues. Please note that NO REFILLS for any discharge medications will be authorized once you are discharged, as it is imperative that you return to your primary care physician (or establish a relationship with a primary care physician if you do not have one) for your aftercare needs so that they can reassess your need for medications and monitor your lab values.    On the [place]  of Discharge:  VITAL SIGNS:  Blood pressure (!) 147/45, pulse 76, temperature 97.9 F (36.6 C), temperature source Oral, resp. rate 20, height 4\' 11"  (1.499 m), weight 241 lb 11.2 oz (109.6 kg), SpO2 96 %. PHYSICAL EXAMINATION:  GENERAL:  82 y.o.-year-old patient lying in the bed with no acute distress. Obese. EYES: Pupils equal, round, reactive to light and accommodation. No scleral icterus. Extraocular muscles intact.  HEENT: Head atraumatic, normocephalic. Oropharynx and nasopharynx clear.  NECK:  Supple, no jugular venous distention. No thyroid enlargement, no tenderness.  LUNGS: Normal breath sounds bilaterally, no wheezing, rales,rhonchi or crepitation. No use of accessory muscles of respiration.  CARDIOVASCULAR: S1, S2 irregular. No murmurs, rubs, or gallops.  ABDOMEN: Soft, non-tender,  non-distended. Bowel sounds present. No organomegaly or mass.  EXTREMITIES: No pedal edema, cyanosis, or clubbing. Right leg cellulitis above ankle is much better. NEUROLOGIC: Cranial nerves II through XII are intact. Muscle strength 4/5 in all extremities. Sensation intact. Gait not checked.  PSYCHIATRIC: The patient is alert and oriented x 3.  SKIN: No obvious rash, lesion, or ulcer.  DATA REVIEW:   CBC  Recent Labs Lab 06/30/16 0649  WBC 9.2  HGB 8.1*  HCT 23.4*  PLT 368    Chemistries   Recent Labs Lab 07/01/16 0437  NA 133*  K 4.5  CL 104  CO2 22  GLUCOSE 296*  BUN 47*  CREATININE 1.99*  CALCIUM 8.1*     Microbiology Results  Results for orders placed or performed during the hospital encounter of 02/19/15  Urine culture     Status: None   Collection Time: 02/19/15 10:05 PM  Result Value Ref Range Status   Specimen Description URINE, CLEAN CATCH  Final   Special Requests Normal  Final   Culture   Final    MULTIPLE SPECIES PRESENT, SUGGEST RECOLLECTION IF CLINICALLY INDICATED   Report Status 02/21/2015 FINAL  Final    RADIOLOGY:  No results found.   Management plans discussed with the patient, her daughter and they are in agreement.  CODE STATUS:     Code Status Orders        Start     Ordered   06/26/16 2244  Full code  Continuous     06/26/16 2243    Code Status History    Date Active Date Inactive Code Status Order ID Comments User Context   02/20/2015  3:53 AM 02/21/2015  5:11 PM Full Code [id number]  Lance Coon, MD Inpatient      TOTAL TIME TAKING CARE OF THIS PATIENT: 43 minutes.    Demetrios Loll M.D on 07/01/2016 at 10:12 AM  Between 7am to 6pm - Pager - [phone]  After 6pm go to [email or web address] - Proofreader  [facility]  Office  [phone]  CC: Primary care physician; Donnie Coffin, MD   Note: This dictation was prepared with Dragon dictation along with smaller phrase technology. Any  transcriptional errors that result from this process are unintentional.

## 2016-07-01 NOTE — Discharge Instructions (Signed)
Heart healthy and ADA diet. Fall precaution. Continue coumadin 2.5 mg po HS, follow up INR and Dr. Lavera Guise for recommendation of coumadin dose.    Cellulitis, Adult Introduction Cellulitis is a skin infection. The infected area is usually red and sore. This condition occurs most often in the arms and lower legs. It is very important to get treated for this condition. Follow these instructions at home:  Take over-the-counter and prescription medicines only as told by your doctor.  If you were prescribed an antibiotic medicine, take it as told by your doctor. Do not stop taking the antibiotic even if you start to feel better.  Drink enough fluid to keep your pee (urine) clear or pale yellow.  Do not touch or rub the infected area.  Raise (elevate) the infected area above the level of your heart while you are sitting or lying down.  Place warm or cold wet cloths (warm or cold compresses) on the infected area. Do this as told by your doctor.  Keep all follow-up visits as told by your doctor. This is important. These visits let your doctor make sure your infection is not getting worse. Contact a doctor if:  You have a fever.  Your symptoms do not get better after 1-2 days of treatment.  Your bone or joint under the infected area starts to hurt after the skin has healed.  Your infection comes back. This can happen in the same area or another area.  You have a swollen bump in the infected area.  You have new symptoms.  You feel ill and also have muscle aches and pains. Get help right away if:  Your symptoms get worse.  You feel very sleepy.  You throw up (vomit) or have watery poop (diarrhea) for a long time.  There are red streaks coming from the infected area.  Your red area gets larger.  Your red area turns darker. This information is not intended to replace advice given to you by your health care provider. Make sure you discuss any questions you have with your health  care provider. Document Released: 01/15/2008 Document Revised: 01/04/2016 Document Reviewed: 06/07/2015  2017 Elsevier

## 2016-08-28 ENCOUNTER — Inpatient Hospital Stay
Admission: EM | Admit: 2016-08-28 | Discharge: 2016-09-02 | DRG: 872 | Disposition: A | Discharge status: Skilled Nursing Facility | Payer: Medicare Other | Attending: Internal Medicine | Admitting: Internal Medicine

## 2016-08-28 ENCOUNTER — Emergency Department: Payer: Medicare Other

## 2016-08-28 ENCOUNTER — Encounter: Payer: Self-pay | Admitting: *Deleted

## 2016-08-28 DIAGNOSIS — A0472 Enterocolitis due to Clostridium difficile, not specified as recurrent: Secondary | ICD-10-CM | POA: Diagnosis present

## 2016-08-28 DIAGNOSIS — I2510 Atherosclerotic heart disease of native coronary artery without angina pectoris: Secondary | ICD-10-CM | POA: Diagnosis present

## 2016-08-28 DIAGNOSIS — E11649 Type 2 diabetes mellitus with hypoglycemia without coma: Secondary | ICD-10-CM | POA: Diagnosis present

## 2016-08-28 DIAGNOSIS — N179 Acute kidney failure, unspecified: Secondary | ICD-10-CM | POA: Diagnosis present

## 2016-08-28 DIAGNOSIS — Z9012 Acquired absence of left breast and nipple: Secondary | ICD-10-CM

## 2016-08-28 DIAGNOSIS — B961 Klebsiella pneumoniae [K. pneumoniae] as the cause of diseases classified elsewhere: Secondary | ICD-10-CM | POA: Diagnosis present

## 2016-08-28 DIAGNOSIS — E785 Hyperlipidemia, unspecified: Secondary | ICD-10-CM | POA: Diagnosis present

## 2016-08-28 DIAGNOSIS — Z955 Presence of coronary angioplasty implant and graft: Secondary | ICD-10-CM

## 2016-08-28 DIAGNOSIS — E860 Dehydration: Secondary | ICD-10-CM | POA: Diagnosis present

## 2016-08-28 DIAGNOSIS — Z794 Long term (current) use of insulin: Secondary | ICD-10-CM

## 2016-08-28 DIAGNOSIS — J209 Acute bronchitis, unspecified: Secondary | ICD-10-CM | POA: Diagnosis present

## 2016-08-28 DIAGNOSIS — K219 Gastro-esophageal reflux disease without esophagitis: Secondary | ICD-10-CM | POA: Diagnosis present

## 2016-08-28 DIAGNOSIS — Z79899 Other long term (current) drug therapy: Secondary | ICD-10-CM

## 2016-08-28 DIAGNOSIS — E039 Hypothyroidism, unspecified: Secondary | ICD-10-CM | POA: Diagnosis present

## 2016-08-28 DIAGNOSIS — I480 Paroxysmal atrial fibrillation: Secondary | ICD-10-CM | POA: Diagnosis present

## 2016-08-28 DIAGNOSIS — J111 Influenza due to unidentified influenza virus with other respiratory manifestations: Secondary | ICD-10-CM | POA: Diagnosis not present

## 2016-08-28 DIAGNOSIS — R109 Unspecified abdominal pain: Secondary | ICD-10-CM

## 2016-08-28 DIAGNOSIS — A419 Sepsis, unspecified organism: Secondary | ICD-10-CM | POA: Diagnosis not present

## 2016-08-28 DIAGNOSIS — N390 Urinary tract infection, site not specified: Secondary | ICD-10-CM | POA: Diagnosis present

## 2016-08-28 DIAGNOSIS — J101 Influenza due to other identified influenza virus with other respiratory manifestations: Secondary | ICD-10-CM | POA: Diagnosis present

## 2016-08-28 DIAGNOSIS — Z853 Personal history of malignant neoplasm of breast: Secondary | ICD-10-CM

## 2016-08-28 DIAGNOSIS — R2681 Unsteadiness on feet: Secondary | ICD-10-CM

## 2016-08-28 DIAGNOSIS — M6281 Muscle weakness (generalized): Secondary | ICD-10-CM

## 2016-08-28 DIAGNOSIS — Z7902 Long term (current) use of antithrombotics/antiplatelets: Secondary | ICD-10-CM

## 2016-08-28 LAB — COMPREHENSIVE METABOLIC PANEL
ALT: 11 U/L — AB (ref 14–54)
AST: 26 U/L (ref 15–41)
Albumin: 2.8 g/dL — ABNORMAL LOW (ref 3.5–5.0)
Alkaline Phosphatase: 51 U/L (ref 38–126)
Anion gap: 9 (ref 5–15)
BUN: 21 mg/dL — ABNORMAL HIGH (ref 6–20)
CHLORIDE: 101 mmol/L (ref 101–111)
CO2: 24 mmol/L (ref 22–32)
CREATININE: 1.08 mg/dL — AB (ref 0.44–1.00)
Calcium: 8.4 mg/dL — ABNORMAL LOW (ref 8.9–10.3)
GFR, EST AFRICAN AMERICAN: 55 mL/min — AB (ref >60)
GFR, EST NON AFRICAN AMERICAN: 47 mL/min — AB (ref >60)
Glucose, Bld: 187 mg/dL — ABNORMAL HIGH (ref 65–99)
POTASSIUM: 4.9 mmol/L (ref 3.5–5.1)
Sodium: 134 mmol/L — ABNORMAL LOW (ref 135–145)
TOTAL PROTEIN: 6.2 g/dL — AB (ref 6.5–8.1)
Total Bilirubin: 0.5 mg/dL (ref 0.3–1.2)

## 2016-08-28 LAB — CBC WITH DIFFERENTIAL/PLATELET
Basophils Absolute: 0 10*3/uL (ref 0–0.1)
Basophils Relative: 0 %
EOS PCT: 0 %
Eosinophils Absolute: 0 10*3/uL (ref 0–0.7)
HCT: 25.6 % — ABNORMAL LOW (ref 35.0–47.0)
Hemoglobin: 8.7 g/dL — ABNORMAL LOW (ref 12.0–16.0)
LYMPHS ABS: 0.3 10*3/uL — AB (ref 1.0–3.6)
LYMPHS PCT: 6 %
MCH: 29.8 pg (ref 26.0–34.0)
MCHC: 34.1 g/dL (ref 32.0–36.0)
MCV: 87.5 fL (ref 80.0–100.0)
MONO ABS: 0.5 10*3/uL (ref 0.2–0.9)
MONOS PCT: 10 %
Neutro Abs: 4.5 10*3/uL (ref 1.4–6.5)
Neutrophils Relative %: 84 %
Platelets: 274 10*3/uL (ref 150–440)
RBC: 2.92 MIL/uL — ABNORMAL LOW (ref 3.80–5.20)
RDW: 14.7 % — AB (ref 11.5–14.5)
WBC: 5.4 10*3/uL (ref 3.6–11.0)

## 2016-08-28 LAB — URINALYSIS, ROUTINE W REFLEX MICROSCOPIC
Bilirubin Urine: NEGATIVE
GLUCOSE, UA: NEGATIVE mg/dL
KETONES UR: NEGATIVE mg/dL
NITRITE: POSITIVE — AB
PH: 5 (ref 5.0–8.0)
PROTEIN: 30 mg/dL — AB
Specific Gravity, Urine: 1.017 (ref 1.005–1.030)

## 2016-08-28 LAB — INFLUENZA PANEL BY PCR (TYPE A & B)
INFLAPCR: POSITIVE — AB
INFLBPCR: NEGATIVE

## 2016-08-28 LAB — BRAIN NATRIURETIC PEPTIDE: B Natriuretic Peptide: 507 pg/mL — ABNORMAL HIGH (ref 0.0–100.0)

## 2016-08-28 LAB — LACTIC ACID, PLASMA
Lactic Acid, Venous: 0.9 mmol/L (ref 0.5–1.9)
Lactic Acid, Venous: 1.4 mmol/L (ref 0.5–1.9)

## 2016-08-28 LAB — GLUCOSE, CAPILLARY
Glucose-Capillary: 182 mg/dL — ABNORMAL HIGH (ref 65–99)
Glucose-Capillary: 234 mg/dL — ABNORMAL HIGH (ref 65–99)

## 2016-08-28 MED ORDER — IPRATROPIUM-ALBUTEROL 0.5-2.5 (3) MG/3ML IN SOLN
3.0000 mL | Freq: Once | RESPIRATORY_TRACT | Status: AC
  Administered 2016-08-28: 3 mL via RESPIRATORY_TRACT
  Filled 2016-08-28: qty 3

## 2016-08-28 MED ORDER — SODIUM CHLORIDE 0.9 % IV SOLN
INTRAVENOUS | Status: DC
  Administered 2016-08-28: 20:00:00 via INTRAVENOUS

## 2016-08-28 MED ORDER — PANTOPRAZOLE SODIUM 40 MG PO TBEC
40.0000 mg | DELAYED_RELEASE_TABLET | Freq: Every day | ORAL | Status: DC
  Administered 2016-08-29 – 2016-09-02 (×5): 40 mg via ORAL
  Filled 2016-08-28 (×6): qty 1

## 2016-08-28 MED ORDER — LISINOPRIL 20 MG PO TABS
20.0000 mg | ORAL_TABLET | Freq: Every day | ORAL | Status: DC
  Filled 2016-08-28: qty 1

## 2016-08-28 MED ORDER — ONDANSETRON HCL 4 MG/2ML IJ SOLN: 4.0000 mg | Freq: Once | INTRAMUSCULAR | Status: DC

## 2016-08-28 MED ORDER — CEFTRIAXONE SODIUM 1 G IJ SOLR
INTRAMUSCULAR | Status: AC
  Filled 2016-08-28: qty 10

## 2016-08-28 MED ORDER — ONDANSETRON HCL 4 MG PO TABS: 4.0000 mg | ORAL_TABLET | Freq: Four times a day (QID) | ORAL | Status: DC | PRN

## 2016-08-28 MED ORDER — CLOPIDOGREL BISULFATE 75 MG PO TABS
75.0000 mg | ORAL_TABLET | Freq: Every day | ORAL | Status: DC
Start: 2016-08-28 — End: 2016-09-02
  Administered 2016-08-28 – 2016-09-02 (×6): 75 mg via ORAL
  Filled 2016-08-28 (×6): qty 1

## 2016-08-28 MED ORDER — ONDANSETRON HCL 4 MG/2ML IJ SOLN: 4.0000 mg | Freq: Four times a day (QID) | INTRAMUSCULAR | Status: DC | PRN

## 2016-08-28 MED ORDER — OSELTAMIVIR PHOSPHATE 75 MG PO CAPS
75.0000 mg | ORAL_CAPSULE | Freq: Once | ORAL | Status: AC
  Administered 2016-08-28: 75 mg via ORAL
  Filled 2016-08-28 (×2): qty 1

## 2016-08-28 MED ORDER — CEFTRIAXONE SODIUM-DEXTROSE 1-3.74 GM-% IV SOLR: 1.0000 g | INTRAVENOUS | Status: DC

## 2016-08-28 MED ORDER — ACETAMINOPHEN 325 MG PO TABS
650.0000 mg | ORAL_TABLET | Freq: Four times a day (QID) | ORAL | Status: DC | PRN
  Administered 2016-08-29 – 2016-09-02 (×3): 650 mg via ORAL
  Filled 2016-08-28 (×4): qty 2

## 2016-08-28 MED ORDER — DEXTROSE 5 % IV SOLN: 1.0000 g | Freq: Once | INTRAVENOUS | Status: DC

## 2016-08-28 MED ORDER — IPRATROPIUM BROMIDE 0.03 % NA SOLN
2.0000 | Freq: Three times a day (TID) | NASAL | Status: DC
  Administered 2016-08-29 – 2016-09-02 (×11): 2 via NASAL
  Filled 2016-08-28: qty 30

## 2016-08-28 MED ORDER — ROSUVASTATIN CALCIUM 10 MG PO TABS
10.0000 mg | ORAL_TABLET | Freq: Every day | ORAL | Status: DC
  Administered 2016-08-29 – 2016-09-02 (×5): 10 mg via ORAL
  Filled 2016-08-28 (×5): qty 1

## 2016-08-28 MED ORDER — LEVOTHYROXINE SODIUM 50 MCG PO TABS
50.0000 ug | ORAL_TABLET | Freq: Every day | ORAL | Status: DC
  Administered 2016-08-29 – 2016-09-02 (×5): 50 ug via ORAL
  Filled 2016-08-28 (×5): qty 1

## 2016-08-28 MED ORDER — METOPROLOL TARTRATE 25 MG PO TABS
25.0000 mg | ORAL_TABLET | Freq: Two times a day (BID) | ORAL | Status: DC
  Administered 2016-08-28 – 2016-08-29 (×2): 25 mg via ORAL
  Filled 2016-08-28 (×3): qty 1

## 2016-08-28 MED ORDER — OSELTAMIVIR PHOSPHATE 30 MG PO CAPS
30.0000 mg | ORAL_CAPSULE | Freq: Two times a day (BID) | ORAL | Status: DC
Start: 2016-08-28 — End: 2016-08-28

## 2016-08-28 MED ORDER — PARICALCITOL 1 MCG PO CAPS
1.0000 ug | ORAL_CAPSULE | ORAL | Status: DC
  Administered 2016-08-28 – 2016-09-02 (×3): 1 ug via ORAL
  Filled 2016-08-28 (×3): qty 1

## 2016-08-28 MED ORDER — INSULIN ASPART 100 UNIT/ML ~~LOC~~ SOLN
0.0000 [IU] | Freq: Every day | SUBCUTANEOUS | Status: DC
  Administered 2016-08-28: 22:00:00 2 [IU] via SUBCUTANEOUS
  Filled 2016-08-28: qty 2

## 2016-08-28 MED ORDER — ENOXAPARIN SODIUM 40 MG/0.4ML ~~LOC~~ SOLN
40.0000 mg | SUBCUTANEOUS | Status: DC
  Filled 2016-08-28 (×2): qty 0.4

## 2016-08-28 MED ORDER — POLYETHYLENE GLYCOL 3350 17 G PO PACK: 17.0000 g | PACK | Freq: Every day | ORAL | Status: DC | PRN

## 2016-08-28 MED ORDER — ONDANSETRON 4 MG PO TBDP
ORAL_TABLET | ORAL | Status: AC
  Filled 2016-08-28: qty 1

## 2016-08-28 MED ORDER — INSULIN ASPART PROT & ASPART (70-30 MIX) 100 UNIT/ML ~~LOC~~ SUSP
28.0000 [IU] | Freq: Two times a day (BID) | SUBCUTANEOUS | Status: DC
  Administered 2016-08-29 (×2): 28 [IU] via SUBCUTANEOUS
  Filled 2016-08-28 (×3): qty 28

## 2016-08-28 MED ORDER — OXYCODONE-ACETAMINOPHEN 5-325 MG PO TABS
1.0000 | ORAL_TABLET | Freq: Two times a day (BID) | ORAL | Status: DC | PRN
Start: 2016-08-28 — End: 2016-09-02
  Filled 2016-08-28: qty 1

## 2016-08-28 MED ORDER — LIDOCAINE HCL (PF) 1 % IJ SOLN
INTRAMUSCULAR | Status: AC
  Filled 2016-08-28: qty 5

## 2016-08-28 MED ORDER — CEFTRIAXONE SODIUM-DEXTROSE 1-3.74 GM-% IV SOLR
1.0000 g | Freq: Once | INTRAVENOUS | Status: DC
Start: 2016-08-28 — End: 2016-08-28

## 2016-08-28 MED ORDER — SODIUM CHLORIDE 0.9 % IV BOLUS (SEPSIS)
500.0000 mL | Freq: Once | INTRAVENOUS | Status: AC
  Administered 2016-08-28: 500 mL via INTRAVENOUS

## 2016-08-28 MED ORDER — DEXTROSE 5 % IV SOLN: 1.0000 g | INTRAVENOUS | Status: DC

## 2016-08-28 MED ORDER — RANOLAZINE ER 500 MG PO TB12
500.0000 mg | ORAL_TABLET | Freq: Two times a day (BID) | ORAL | Status: DC
  Administered 2016-08-29 – 2016-09-02 (×9): 500 mg via ORAL
  Filled 2016-08-28 (×12): qty 1

## 2016-08-28 MED ORDER — ACETAMINOPHEN 650 MG RE SUPP: 650.0000 mg | Freq: Four times a day (QID) | RECTAL | Status: DC | PRN

## 2016-08-28 MED ORDER — OSELTAMIVIR PHOSPHATE 30 MG PO CAPS
30.0000 mg | ORAL_CAPSULE | Freq: Two times a day (BID) | ORAL | Status: AC
  Administered 2016-08-29 – 2016-09-01 (×8): 30 mg via ORAL
  Filled 2016-08-28 (×8): qty 1

## 2016-08-28 MED ORDER — ACETAMINOPHEN 325 MG PO TABS
650.0000 mg | ORAL_TABLET | Freq: Once | ORAL | Status: AC
  Administered 2016-08-28: 650 mg via ORAL
  Filled 2016-08-28: qty 2

## 2016-08-28 MED ORDER — CEFTRIAXONE SODIUM 1 G IJ SOLR
1.0000 g | Freq: Once | INTRAMUSCULAR | Status: AC
  Administered 2016-08-28: 1 g via INTRAMUSCULAR

## 2016-08-28 MED ORDER — CEFTRIAXONE SODIUM-DEXTROSE 1-3.74 GM-% IV SOLR
1.0000 g | INTRAVENOUS | Status: DC
  Administered 2016-08-29: 1 g via INTRAVENOUS
  Filled 2016-08-28: qty 50

## 2016-08-28 MED ORDER — INSULIN ASPART 100 UNIT/ML ~~LOC~~ SOLN
0.0000 [IU] | Freq: Three times a day (TID) | SUBCUTANEOUS | Status: DC
Start: 2016-08-28 — End: 2016-09-02
  Administered 2016-08-29: 3 [IU] via SUBCUTANEOUS
  Administered 2016-08-30: 2 [IU] via SUBCUTANEOUS
  Administered 2016-08-30 – 2016-08-31 (×3): 3 [IU] via SUBCUTANEOUS
  Administered 2016-08-31: 09:00:00 2 [IU] via SUBCUTANEOUS
  Administered 2016-09-01 (×2): 5 [IU] via SUBCUTANEOUS
  Administered 2016-09-01: 3 [IU] via SUBCUTANEOUS
  Administered 2016-09-02 (×2): 5 [IU] via SUBCUTANEOUS
  Filled 2016-08-28: qty 3
  Filled 2016-08-28 (×2): qty 2
  Filled 2016-08-28: qty 5
  Filled 2016-08-28: qty 3
  Filled 2016-08-28: qty 2
  Filled 2016-08-28 (×2): qty 3
  Filled 2016-08-28 (×4): qty 5
  Filled 2016-08-28: qty 3

## 2016-08-28 NOTE — ED Notes (Signed)
Resumed care from [place], [place]. Pt stated that she could not use the toilet without a walker. Stretcher moved to toilet and pt assisted with swiveling onto toilet to collect urine sample. Pt very negative and stating "I can't do it" to everything requested of her. Pt encouraged to rest and let us help her feel better, which includes allowing lab tech to draw blood. Pt agreed to allow blood draw, and lab called to collect.

## 2016-08-28 NOTE — H&P (Signed)
[place] at [place] NAME: Regina [name]    MR#:  [id number]  DATE OF BIRTH:  [birth year]  DATE OF ADMISSION:  08/28/2016  PRIMARY CARE PHYSICIAN: Donnie Coffin, MD   REQUESTING/REFERRING PHYSICIAN: Dr. Lisa Roca  CHIEF COMPLAINT:   Chief Complaint  Patient presents with  . Influenza  . Fever    HISTORY OF PRESENT ILLNESS:  Regina [name]  is a 83 y.o. female with a known history of Breast cancer status post left-sided mastectomy, coronary artery disease status post stents, diabetes, hypertension, hyperlipidemia, GERD, hypothyroidism, paroxysmal atrial fibrillation who presents to the hospital due to malaise, body aches and just not feeling well. Patient says that she has not been feeling well now for the past few days to a week. She admits to a cough which is productive of yellow sputum, no chills, nausea, vomiting, abdominal pain, diarrhea. She was having significant weakness and malaise today and therefore came to the ER for further evaluation. In the emergency room patient was noted to be positive for influenza B and also noted to have a urinary tract infection. Patient denies any dysuria, hematuria, frequency or any other associated symptoms presently. Hospitalist services were contacted further treatment and evaluation.  PAST MEDICAL HISTORY:   Past Medical History:  Diagnosis Date  . Breast cancer, left [facility]) 2013   Mastectomy.   . CAD (coronary artery disease)    s/p cath in 2009 with 2 stents  . Diabetes mellitus without complication ([place])   . GERD (gastroesophageal reflux disease)   . HLD (hyperlipidemia)   . Hypertension   . Hypothyroidism   . Paroxysmal atrial fibrillation ([place])     PAST SURGICAL HISTORY:   Past Surgical History:  Procedure Laterality Date  . BREAST LUMPECTOMY    . CHOLECYSTECTOMY    . MASTECTOMY Left     SOCIAL HISTORY:   Social History  Substance Use Topics  . Smoking status: Never Smoker  .  Smokeless tobacco: Never Used  . Alcohol use No    FAMILY HISTORY:   Family History  Problem Relation Age of Onset  . Kidney failure Mother   . Lung cancer Father     DRUG ALLERGIES:  No Known Allergies  REVIEW OF SYSTEMS:   Review of Systems  Constitutional: Positive for chills and malaise/fatigue. Negative for fever and weight loss.  HENT: Negative for congestion, nosebleeds and tinnitus.   Eyes: Negative for blurred vision, double vision and redness.  Respiratory: Positive for cough, sputum production and wheezing. Negative for hemoptysis and shortness of breath.   Cardiovascular: Negative for chest pain, orthopnea, leg swelling and PND.  Gastrointestinal: Negative for abdominal pain, diarrhea, melena, nausea and vomiting.  Genitourinary: Negative for dysuria, hematuria and urgency.  Musculoskeletal: Negative for falls and joint pain.  Neurological: Positive for weakness. Negative for dizziness, tingling, sensory change, focal weakness, seizures and headaches.  Endo/Heme/Allergies: Negative for polydipsia. Does not bruise/bleed easily.  Psychiatric/Behavioral: Negative for depression and memory loss. The patient is not nervous/anxious.     MEDICATIONS AT HOME:   Prior to Admission medications   Medication Sig Start Date End Date Taking? Authorizing Provider  acetaminophen (TYLENOL) 325 MG tablet Take 2 tablets (650 mg total) by mouth every 6 (six) hours as needed for mild pain (or Fever >/= 101). 02/21/15  Yes Aruna Gouru, MD  bumetanide (BUMEX) 2 MG tablet Take 2 mg by mouth daily.   Yes Historical Provider, MD  clopidogrel (PLAVIX) 75  MG tablet Take 75 mg by mouth daily.   Yes Historical Provider, MD  esomeprazole (NEXIUM) 40 MG capsule Take 40 mg by mouth daily.    Yes Historical Provider, MD  insulin aspart protamine- aspart (NOVOLOG MIX 70/30) (70-30) 100 UNIT/ML injection Inject 0.28 mLs (28 Units total) into the skin 2 (two) times daily with a meal. Patient taking  differently: Inject 55 Units into the skin daily with breakfast.  02/21/15  Yes Nicholes Mango, MD  ipratropium (ATROVENT) 0.03 % nasal spray Place 2 sprays into both nostrils 3 (three) times daily.   Yes Historical Provider, MD  levothyroxine (SYNTHROID, LEVOTHROID) 50 MCG tablet Take 50 mcg by mouth daily.   Yes Historical Provider, MD  lisinopril (PRINIVIL,ZESTRIL) 20 MG tablet Take 20 mg by mouth daily. 08/25/16  Yes Historical Provider, MD  metoprolol tartrate (LOPRESSOR) 25 MG tablet Take 1 tablet (25 mg total) by mouth 2 (two) times daily. 07/01/16  Yes Demetrios Loll, MD  oxyCODONE-acetaminophen (PERCOCET/ROXICET) 5-325 MG tablet Take 1 tablet by mouth 2 (two) times daily as needed for severe pain.   Yes Historical Provider, MD  paricalcitol (ZEMPLAR) 1 MCG capsule Take 1 mcg by mouth 3 (three) times a week. Pt takes on Monday, Wednesday, and Friday.   Yes Historical Provider, MD  polyethylene glycol (MIRALAX / GLYCOLAX) packet Take 17 g by mouth daily as needed for mild constipation. 07/01/16  Yes Demetrios Loll, MD  ranolazine (RANEXA) 500 MG 12 hr tablet Take 500 mg by mouth 2 (two) times daily.   Yes Historical Provider, MD  rosuvastatin (CRESTOR) 10 MG tablet Take 10 mg by mouth daily.   Yes Historical Provider, MD  ferrous sulfate 325 (65 FE) MG tablet Take 1 tablet (325 mg total) by mouth 2 (two) times daily with a meal. Patient not taking: Reported on 08/28/2016 07/01/16   Demetrios Loll, MD  furosemide (LASIX) 20 MG tablet Take 1 tablet (20 mg total) by mouth daily. Patient not taking: Reported on 08/28/2016 07/01/16   Demetrios Loll, MD  HYDROcodone-acetaminophen (NORCO/VICODIN) 5-325 MG tablet Take 1 tablet by mouth every 6 (six) hours as needed for moderate pain. Patient not taking: Reported on 08/28/2016 07/01/16   Demetrios Loll, MD      VITAL SIGNS:  Blood pressure (!) 160/55, pulse 85, temperature (!) 101.1 F (38.4 C), temperature source Oral, resp. rate (!) 21, height 4\' 11"  (1.499 m), weight 105.2 kg  (232 lb), SpO2 100 %.  PHYSICAL EXAMINATION:  Physical Exam  GENERAL:  83 y.o.-year-old patient lying in the bed in no acute distress.  EYES: Pupils equal, round, reactive to light and accommodation. No scleral icterus. Extraocular muscles intact.  HEENT: Head atraumatic, normocephalic. Oropharynx and nasopharynx clear. No oropharyngeal erythema, dry oral mucosa  NECK:  Supple, no jugular venous distention. No thyroid enlargement, no tenderness.  LUNGS: Normal breath sounds bilaterally, minimal end-exp. Wheezing b/l, rales, rhonchi. No use of accessory muscles of respiration.  CARDIOVASCULAR: S1, S2 RRR. No murmurs, rubs, gallops, clicks.  ABDOMEN: Soft, nontender, nondistended. Bowel sounds present. No organomegaly or mass.  EXTREMITIES: No cyanosis, or clubbing. + 2 pedal & radial pulses b/l.  +1-2 edema b/l, signs of chronic venous stasis b/l NEUROLOGIC: Cranial nerves II through XII are intact. No focal Motor or sensory deficits appreciated b/l.  Globally weak. PSYCHIATRIC: The patient is alert and oriented x 3. Good affect.  SKIN: No obvious rash, lesion, or ulcer.   LABORATORY PANEL:   CBC No results for input(s): WBC, HGB,  HCT, PLT in the last 168 hours. ------------------------------------------------------------------------------------------------------------------  Chemistries   Recent Labs Lab 08/28/16 0748  NA 134*  K 4.9  CL 101  CO2 24  GLUCOSE 187*  BUN 21*  CREATININE 1.08*  CALCIUM 8.4*  AST 26  ALT 11*  ALKPHOS 51  BILITOT 0.5   ------------------------------------------------------------------------------------------------------------------  Cardiac Enzymes No results for input(s): TROPONINI in the last 168 hours. ------------------------------------------------------------------------------------------------------------------  RADIOLOGY:  Dg Chest Port 1 View  Result Date: 08/28/2016 CLINICAL DATA:  Altered mental status and weakness today. EXAM:  PORTABLE CHEST 1 VIEW COMPARISON:  06/30/2016 in FINDINGS: Pacer noted, lead positions unchanged. Upper normal heart size for AP projection. Airway thickening is present, suggesting bronchitis or reactive airways disease. Mildly indistinct pulmonary vasculature, similar to prior. No overt Kerley B-lines. Left mastectomy with axillary clips.  Thoracic spondylosis. IMPRESSION: 1. Airway thickening is present, suggesting bronchitis or reactive airways disease. 2. Mildly indistinct pulmonary vasculature could be a manifestation of pulmonary venous hypertension. No overt edema. Electronically Signed   By: Van Clines M.D.   On: 08/28/2016 08:12     IMPRESSION AND PLAN:   83 year old female with past medical history of breast cancer, hypertension, hyperlipidemia, hypothyroidism, paroxysmal atrial fibrillation, diabetes, coronary artery disease presents to the hospital due to chills and malaise cough and wheezing.  1. Sepsis-patient's meets criteria given her tachypnea, fever and also noted to be positive for the flu and noted to have a urinary tract infection. -We'll place the patient IV fluids, antipyretics, give her IV ceftriaxone for the UTI and Tamiflu for the flu. Follow clinically.  2. Flu-patient is positive for influenza B. Place on droplet precautions. -Start on Tamiflu.  3. Urinary tract infection-will treat patient with IV ceftriaxone. Follow urine cultures.  4. Diabetes type 2 without complication-continue patient's NovoLog 70/30 and sliding scale insulin. -Place on carb controlled diet and follow blood sugars.  5. Acute kidney injury-secondary to sepsis and dehydration. Place on IV fluids, hold diuretics. Follow BUN and creatinine.  6. GERD-continue Protonix.  7. History of coronary artery disease-continue Plavix, metoprolol, lisinopril, Crestor, Ranexa.  8. Hypothyroidism-continue Synthroid.  9. Essential hypertension-continue metoprolol, lisinopril.  All the records are  reviewed and case discussed with ED provider. Management plans discussed with the patient, family and they are in agreement.  CODE STATUS: Full  TOTAL TIME TAKING CARE OF THIS PATIENT: 45 minutes.    Henreitta Leber M.D on 08/28/2016 at 12:27 PM  Between 7am to 6pm - Pager - [phone]  After 6pm go to [email or web address] - password [email or web address] [facility]  Office  [phone]  CC: Primary care physician; Donnie Coffin, MD

## 2016-08-28 NOTE — ED Provider Notes (Signed)
[facility] Emergency Department Provider Note ____________________________________________   I have reviewed the triage vital signs and the triage nursing note.  HISTORY  Chief Complaint Influenza and Fever   Historian Patient  HPI Regina [name] is a 83 y.o. female who lives at home, history of DM, ht, paroxysmal afib, CAD, presents for feeling "bad" since yesterday.  Reports abdominal pain, nausea, body aches, congestion, wheezing and fever. She has no history of lung disease and does not use inhalers or oxygen.  No chest pain, but she does have body muscular aches all over.  Daughter who comes into the house to take care of her at times has similar symptoms.  Abdominal discomfort is moderate, body aches are moderate.    Past Medical History:  Diagnosis Date  . Breast cancer, left [facility]) 2013   Mastectomy.   . CAD (coronary artery disease)    s/p cath in 2009 with 2 stents  . Diabetes mellitus without complication ([place])   . GERD (gastroesophageal reflux disease)   . HLD (hyperlipidemia)   . Hypertension   . Hypothyroidism   . Paroxysmal atrial fibrillation [facility])     Patient Active Problem List   Diagnosis Date Noted  . Cellulitis 06/26/2016  . UTI (lower urinary tract infection) 02/20/2015  . Elevated troponin 02/20/2015  . Hypoglycemia 02/20/2015  . Type 2 diabetes mellitus ([place]) 02/20/2015  . HTN (hypertension) 02/20/2015  . HLD (hyperlipidemia) 02/20/2015  . Paroxysmal atrial fibrillation ([place]) 02/20/2015  . GERD (gastroesophageal reflux disease) 02/20/2015    Past Surgical History:  Procedure Laterality Date  . BREAST LUMPECTOMY    . CHOLECYSTECTOMY    . MASTECTOMY Left     Prior to Admission medications   Medication Sig Start Date End Date Taking? Authorizing Provider  acetaminophen (TYLENOL) 325 MG tablet Take 2 tablets (650 mg total) by mouth every 6 (six) hours as needed for mild pain (or Fever >/= 101). 02/21/15  Yes Aruna  Gouru, MD  bumetanide (BUMEX) 2 MG tablet Take 2 mg by mouth daily.   Yes Historical Provider, MD  clopidogrel (PLAVIX) 75 MG tablet Take 75 mg by mouth daily.   Yes Historical Provider, MD  esomeprazole (NEXIUM) 40 MG capsule Take 40 mg by mouth daily.    Yes Historical Provider, MD  insulin aspart protamine- aspart (NOVOLOG MIX 70/30) (70-30) 100 UNIT/ML injection Inject 0.28 mLs (28 Units total) into the skin 2 (two) times daily with a meal. Patient taking differently: Inject 55 Units into the skin daily with breakfast.  02/21/15  Yes Nicholes Mango, MD  levothyroxine (SYNTHROID, LEVOTHROID) 50 MCG tablet Take 50 mcg by mouth daily.   Yes Historical Provider, MD  LISINOPRIL PO Take 1 tablet by mouth.   Yes Historical Provider, MD  metoprolol tartrate (LOPRESSOR) 25 MG tablet Take 1 tablet (25 mg total) by mouth 2 (two) times daily. 07/01/16  Yes Demetrios Loll, MD  OVER THE COUNTER MEDICATION Place 2 sprays into both nostrils 2 (two) times daily as needed. Over the counter nasal spray in white bottle   Yes Historical Provider, MD  oxyCODONE-acetaminophen (PERCOCET/ROXICET) 5-325 MG tablet Take 1 tablet by mouth 2 (two) times daily as needed for severe pain.   Yes Historical Provider, MD  paricalcitol (ZEMPLAR) 1 MCG capsule Take 1 mcg by mouth 3 (three) times a week. Pt takes on Monday, Wednesday, and Friday.   Yes Historical Provider, MD  polyethylene glycol (MIRALAX / GLYCOLAX) packet Take 17 g by mouth daily as  needed for mild constipation. 07/01/16  Yes Demetrios Loll, MD  ranolazine (RANEXA) 500 MG 12 hr tablet Take 500 mg by mouth 2 (two) times daily.   Yes Historical Provider, MD  rosuvastatin (CRESTOR) 10 MG tablet Take 10 mg by mouth daily.   Yes Historical Provider, MD  ferrous sulfate 325 (65 FE) MG tablet Take 1 tablet (325 mg total) by mouth 2 (two) times daily with a meal. 07/01/16   Demetrios Loll, MD  furosemide (LASIX) 20 MG tablet Take 1 tablet (20 mg total) by mouth daily. 07/01/16   Demetrios Loll, MD   HYDROcodone-acetaminophen (NORCO/VICODIN) 5-325 MG tablet Take 1 tablet by mouth every 6 (six) hours as needed for moderate pain. 07/01/16   Demetrios Loll, MD    No Known Allergies  Family History  Problem Relation Age of Onset  . Kidney failure Mother   . Lung cancer Father     Social History Social History  Substance Use Topics  . Smoking status: Never Smoker  . Smokeless tobacco: Never Used  . Alcohol use No    Review of Systems  Constitutional: Positive for fever. Eyes: Negative for visual changes. ENT: Negative for sore throat. Cardiovascular: Negative for chest pain. Respiratory: Positive for shortness of breath. Gastrointestinal: Negative for vomiting and diarrhea.  Points to mid abdomen when describing location of pain. Genitourinary: Negative for dysuria. Musculoskeletal: Negative for back pain. Skin: Negative for rash. Neurological: Negative for headache. 10 point Review of Systems otherwise negative ____________________________________________   PHYSICAL EXAM:  VITAL SIGNS: ED Triage Vitals  Enc Vitals Group     BP 08/28/16 0521 (!) 157/58     Pulse Rate 08/28/16 0521 78     Resp 08/28/16 0521 20     Temp 08/28/16 0521 (!) 101.1 F (38.4 C)     Temp Source 08/28/16 0521 Oral     SpO2 08/28/16 0521 90 %     Weight 08/28/16 0521 232 lb (105.2 kg)     Height 08/28/16 0521 4\' 11"  (1.499 m)     Head Circumference --      Peak Flow --      Pain Score 08/28/16 0524 10     Pain Loc --      Pain Edu? --      Excl. in [place]? --      Constitutional: Alert and oriented. The patient doesn't feel well, and active wheezing without severe distress. HEENT   Head: Normocephalic and atraumatic.      Eyes: Conjunctivae are normal. PERRL. Normal extraocular movements.      Ears:         Nose: No congestion/rhinnorhea.   Mouth/Throat: Mucous membranes are moderately dry.   Neck: No stridor. Cardiovascular/Chest: Normal rate, regular rhythm.  No murmurs,  rubs, or gallops. Respiratory: Tachypnea without retractions. Audible wheezing from the bedside, all fields, moderate.  Some decreased air movement throughout without obvious rhonchi. Gastrointestinal: Soft. No distention, no guarding, no rebound. Obese, mild diffuse discomfort.  Genitourinary/rectal:Deferred Musculoskeletal: Nontender with normal range of motion in all extremities. No joint effusions.  No lower extremity tenderness.  No edema. Neurologic:  Normal speech and language. No gross or focal neurologic deficits are appreciated. Skin:  Skin is warm, dry and intact. No rash noted. Psychiatric: Mood and affect are normal. Speech and behavior are normal. Patient exhibits appropriate insight and judgment.   ____________________________________________  LABS (pertinent positives/negatives)  Labs Reviewed  INFLUENZA PANEL BY PCR (TYPE A & B) - Abnormal; Notable for  the following:       Result Value   Influenza A By PCR POSITIVE (*)    All other components within normal limits  URINALYSIS, ROUTINE W REFLEX MICROSCOPIC - Abnormal; Notable for the following:    Color, Urine YELLOW (*)    APPearance CLOUDY (*)    Hgb urine dipstick SMALL (*)    Protein, ur 30 (*)    Nitrite POSITIVE (*)    Leukocytes, UA LARGE (*)    Bacteria, UA MANY (*)    Squamous Epithelial / LPF 0-5 (*)    All other components within normal limits  CULTURE, BLOOD (ROUTINE X 2)  CULTURE, BLOOD (ROUTINE X 2)  URINE CULTURE  CBC WITH DIFFERENTIAL/PLATELET  COMPREHENSIVE METABOLIC PANEL    ____________________________________________    EKG I, Lisa Roca, MD, the attending physician have personally viewed and interpreted all ECGs.  76 bpm. Normal sinus rhythm. Nonspecific intraventricular conduction delay. Normal axis. Nonspecific ST and T-wave. ____________________________________________  RADIOLOGY All Xrays were viewed by me. Imaging interpreted by Radiologist.  Chest:    CLINICAL DATA:  Altered mental status and weakness today.  EXAM: PORTABLE CHEST 1 VIEW  COMPARISON: 06/30/2016 in  FINDINGS: Pacer noted, lead positions unchanged. Upper normal heart size for AP projection. Airway thickening is present, suggesting bronchitis or reactive airways disease. Mildly indistinct pulmonary vasculature, similar to prior. No overt Kerley B-lines.  Left mastectomy with axillary clips. Thoracic spondylosis.  IMPRESSION: 1. Airway thickening is present, suggesting bronchitis or reactive airways disease. 2. Mildly indistinct pulmonary vasculature could be a manifestation of pulmonary venous hypertension. No overt edema.      __________________________________________  PROCEDURES  Procedure(s) performed: None  Critical Care performed: None  ____________________________________________   ED COURSE / ASSESSMENT AND PLAN  Pertinent labs & imaging results that were available during my care of the patient were reviewed by me and considered in my medical decision making (see chart for details).  She looks like she feels ill and has wheezing and fever.  Flu positive but also nitrite positive uti.  No hypotension or tachycardia, or hypoxia, but still wheezing and tachypneic.  I am concerned given her age and medical condition sending her home with fever and flu and tachypnea with wheezing -- concern she need observation for her respiratory status.  In addition, she also has the uti, and although not clincially septic at present, will send eval labs and cultures.  Very difficult IV access. Initial right arm peripheral IV stopped working. I tried as well as additional ER doctor assessed and no luck with EJ.  I did asked nurse to go ahead and give Rocephin IM rather than continue delay for IV access. Blood cultures were attempted but probably did not get enough blood, sent to lab anyway.  Dr. Quentin Cornwall attempted/obtained right upper extremity ultrasound guided IV.  Patient  slightly improved in terms of respiratory wheezing after DuoNeb.    Discussed with hospitalist for admission. CBC pending at time of consultation.    CONSULTATIONS:   Hospitalist for admission.   Patient / Family / Caregiver informed of clinical course, medical decision-making process, and agree with plan.   ___________________________________________   FINAL CLINICAL IMPRESSION(S) / ED DIAGNOSES   Final diagnoses:  Influenza  Urinary tract infection without hematuria, site unspecified              Note: This dictation was prepared with Dragon dictation. Any transcriptional errors that result from this process are unintentional    Lisa Roca, MD  08/28/16 1153  

## 2016-08-28 NOTE — ED Notes (Signed)
Able to obtain some blood from IV for cultures - probably not enough. IV no longer works and will not flush. Dr. Reita Cliche to attempt access.

## 2016-08-28 NOTE — ED Provider Notes (Signed)
18-gauge peripheral IV inserted under ultrasound guidance to the right arm with adequate flush and draw. Patient tolerated procedure well.   Merlyn Lot, MD 08/28/16 1201

## 2016-08-28 NOTE — ED Notes (Signed)
Pt refused to allow Dr. Jimmye Norman to attempt IV in neck. She stated that she wanted her arm used. 2 more attempts made at IV access in right arm with no positive results. Pt asked for assistance toileting so she was moved and assisted to toilet. Pt understands we will be doing PO medications. She  Insists that she needs to "moved to a room."

## 2016-08-29 ENCOUNTER — Encounter: Payer: Self-pay | Admitting: Radiology

## 2016-08-29 ENCOUNTER — Inpatient Hospital Stay: Payer: Medicare Other

## 2016-08-29 LAB — GLUCOSE, CAPILLARY
GLUCOSE-CAPILLARY: 208 mg/dL — AB (ref 65–99)
GLUCOSE-CAPILLARY: 57 mg/dL — AB (ref 65–99)
GLUCOSE-CAPILLARY: 57 mg/dL — AB (ref 65–99)
GLUCOSE-CAPILLARY: 60 mg/dL — AB (ref 65–99)
GLUCOSE-CAPILLARY: 71 mg/dL (ref 65–99)
GLUCOSE-CAPILLARY: 118 mg/dL — AB (ref 65–99)
GLUCOSE-CAPILLARY: 91 mg/dL (ref 65–99)
Glucose-Capillary: 81 mg/dL (ref 65–99)

## 2016-08-29 LAB — BLOOD CULTURE ID PANEL (REFLEXED)
Acinetobacter baumannii: NOT DETECTED
CANDIDA GLABRATA: NOT DETECTED
CANDIDA KRUSEI: NOT DETECTED
CANDIDA TROPICALIS: NOT DETECTED
Candida albicans: NOT DETECTED
Candida parapsilosis: NOT DETECTED
ENTEROBACTER CLOACAE COMPLEX: NOT DETECTED
ENTEROCOCCUS SPECIES: NOT DETECTED
Enterobacteriaceae species: NOT DETECTED
Escherichia coli: NOT DETECTED
Haemophilus influenzae: NOT DETECTED
KLEBSIELLA PNEUMONIAE: NOT DETECTED
Klebsiella oxytoca: NOT DETECTED
LISTERIA MONOCYTOGENES: NOT DETECTED
Methicillin resistance: NOT DETECTED
Neisseria meningitidis: NOT DETECTED
PROTEUS SPECIES: NOT DETECTED
Pseudomonas aeruginosa: NOT DETECTED
SERRATIA MARCESCENS: NOT DETECTED
STAPHYLOCOCCUS AUREUS BCID: NOT DETECTED
STAPHYLOCOCCUS SPECIES: DETECTED — AB
STREPTOCOCCUS AGALACTIAE: NOT DETECTED
Streptococcus pneumoniae: NOT DETECTED
Streptococcus pyogenes: NOT DETECTED
Streptococcus species: NOT DETECTED

## 2016-08-29 LAB — GASTROINTESTINAL PANEL BY PCR, STOOL (REPLACES STOOL CULTURE)
Adenovirus F40/41: NOT DETECTED
Astrovirus: NOT DETECTED
CAMPYLOBACTER SPECIES: NOT DETECTED
CRYPTOSPORIDIUM: NOT DETECTED
Cyclospora cayetanensis: NOT DETECTED
Entamoeba histolytica: NOT DETECTED
Enteroaggregative E coli (EAEC): NOT DETECTED
Enteropathogenic E coli (EPEC): NOT DETECTED
Enterotoxigenic E coli (ETEC): NOT DETECTED
Giardia lamblia: NOT DETECTED
NOROVIRUS GI/GII: NOT DETECTED
PLESIMONAS SHIGELLOIDES: NOT DETECTED
ROTAVIRUS A: NOT DETECTED
SALMONELLA SPECIES: NOT DETECTED
SHIGA LIKE TOXIN PRODUCING E COLI (STEC): NOT DETECTED
SHIGELLA/ENTEROINVASIVE E COLI (EIEC): NOT DETECTED
Sapovirus (I, II, IV, and V): NOT DETECTED
Vibrio cholerae: NOT DETECTED
Vibrio species: NOT DETECTED
YERSINIA ENTEROCOLITICA: NOT DETECTED

## 2016-08-29 LAB — BASIC METABOLIC PANEL
Anion gap: 7 (ref 5–15)
BUN: 22 mg/dL — AB (ref 6–20)
CO2: 25 mmol/L (ref 22–32)
Calcium: 7.9 mg/dL — ABNORMAL LOW (ref 8.9–10.3)
Chloride: 103 mmol/L (ref 101–111)
Creatinine, Ser: 1.25 mg/dL — ABNORMAL HIGH (ref 0.44–1.00)
GFR, EST AFRICAN AMERICAN: 46 mL/min — AB (ref >60)
GFR, EST NON AFRICAN AMERICAN: 40 mL/min — AB (ref >60)
Glucose, Bld: 242 mg/dL — ABNORMAL HIGH (ref 65–99)
Potassium: 4 mmol/L (ref 3.5–5.1)
SODIUM: 135 mmol/L (ref 135–145)

## 2016-08-29 LAB — CBC
HCT: 27.3 % — ABNORMAL LOW (ref 35.0–47.0)
Hemoglobin: 9.2 g/dL — ABNORMAL LOW (ref 12.0–16.0)
MCH: 30.1 pg (ref 26.0–34.0)
MCHC: 33.9 g/dL (ref 32.0–36.0)
MCV: 88.7 fL (ref 80.0–100.0)
PLATELETS: 254 10*3/uL (ref 150–440)
RBC: 3.08 MIL/uL — AB (ref 3.80–5.20)
RDW: 15.1 % — AB (ref 11.5–14.5)
WBC: 7.4 10*3/uL (ref 3.6–11.0)

## 2016-08-29 LAB — C DIFFICILE QUICK SCREEN W PCR REFLEX
C DIFFICILE (CDIFF) TOXIN: POSITIVE — AB
C DIFFICLE (CDIFF) ANTIGEN: POSITIVE — AB
C Diff interpretation: DETECTED

## 2016-08-29 MED ORDER — IPRATROPIUM-ALBUTEROL 0.5-2.5 (3) MG/3ML IN SOLN
3.0000 mL | Freq: Four times a day (QID) | RESPIRATORY_TRACT | Status: DC | PRN
  Administered 2016-08-29 – 2016-09-01 (×8): 3 mL via RESPIRATORY_TRACT
  Filled 2016-08-29 (×8): qty 3

## 2016-08-29 MED ORDER — CEFTRIAXONE SODIUM-DEXTROSE 1-3.74 GM-% IV SOLR
1.0000 g | Freq: Once | INTRAVENOUS | Status: AC
  Administered 2016-08-29: 1 g via INTRAVENOUS

## 2016-08-29 MED ORDER — IOPAMIDOL (ISOVUE-300) INJECTION 61%
15.0000 mL | INTRAVENOUS | Status: AC
  Administered 2016-08-29 (×2): 15 mL via ORAL

## 2016-08-29 MED ORDER — IOPAMIDOL (ISOVUE-300) INJECTION 61%
75.0000 mL | Freq: Once | INTRAVENOUS | Status: AC | PRN
  Administered 2016-08-29: 75 mL via INTRAVENOUS

## 2016-08-29 MED ORDER — IOPAMIDOL (ISOVUE-300) INJECTION 61%: 30.0000 mL | Freq: Once | INTRAVENOUS | Status: DC | PRN

## 2016-08-29 MED ORDER — BENZONATATE 100 MG PO CAPS
100.0000 mg | ORAL_CAPSULE | Freq: Three times a day (TID) | ORAL | Status: DC
  Administered 2016-08-29 – 2016-09-02 (×13): 100 mg via ORAL
  Filled 2016-08-29 (×13): qty 1

## 2016-08-29 MED ORDER — CEFTRIAXONE SODIUM-DEXTROSE 2-2.22 GM-% IV SOLR
2.0000 g | INTRAVENOUS | Status: DC
  Administered 2016-08-30 – 2016-09-02 (×4): 2 g via INTRAVENOUS
  Filled 2016-08-29 (×6): qty 50

## 2016-08-29 MED ORDER — GUAIFENESIN ER 600 MG PO TB12
600.0000 mg | ORAL_TABLET | Freq: Two times a day (BID) | ORAL | Status: DC
  Administered 2016-08-29 – 2016-09-02 (×9): 600 mg via ORAL
  Filled 2016-08-29 (×9): qty 1

## 2016-08-29 MED ORDER — FUROSEMIDE 20 MG PO TABS
20.0000 mg | ORAL_TABLET | Freq: Every day | ORAL | Status: DC
  Administered 2016-08-29 – 2016-09-02 (×4): 20 mg via ORAL
  Filled 2016-08-29 (×5): qty 1

## 2016-08-29 MED ORDER — VANCOMYCIN 50 MG/ML ORAL SOLUTION
125.0000 mg | Freq: Four times a day (QID) | ORAL | Status: DC
  Administered 2016-08-29 – 2016-09-02 (×16): 125 mg via ORAL
  Filled 2016-08-29 (×18): qty 2.5

## 2016-08-29 NOTE — Progress Notes (Signed)
Patient blood glucose 57. Hypoglycemia protocol followed. Patient did not order lunch. Tray to be delivered Water quality scientist ordered. Madlyn Frankel, RN

## 2016-08-29 NOTE — Progress Notes (Signed)
[place] [facility] [place] NAME: Regina [name]    MR#:  [id number]  DATE OF BIRTH:  05-10-1934  SUBJECTIVE:  CHIEF COMPLAINT:   Chief Complaint  Patient presents with  . Influenza  . Fever    REVIEW OF SYSTEMS:  Review of Systems  Constitutional: Positive for malaise/fatigue. Negative for chills and fever.  HENT: Negative for congestion, ear discharge, hearing loss and nosebleeds.   Eyes: Negative for blurred vision and double vision.  Respiratory: Positive for cough and shortness of breath. Negative for wheezing.   Cardiovascular: Negative for chest pain and palpitations.  Gastrointestinal: Positive for abdominal pain. Negative for constipation, diarrhea, nausea and vomiting.  Genitourinary: Positive for dysuria and urgency.  Musculoskeletal: Positive for myalgias.  Neurological: Negative for dizziness, sensory change, speech change, seizures and headaches.  Psychiatric/Behavioral: Negative for depression.    DRUG ALLERGIES:  No Known Allergies  VITALS:  Blood pressure (!) 112/27, pulse 81, temperature 98.3 F (36.8 C), resp. rate (!) 22, height 4\' 11"  (1.499 m), weight 105.2 kg (232 lb), SpO2 97 %.  PHYSICAL EXAMINATION:  Physical Exam  GENERAL:  83 y.o.-year-old obese patient lying in the bed. Appears very uncomfortable EYES: Pupils equal, round, reactive to light and accommodation. No scleral icterus. Extraocular muscles intact.  HEENT: Head atraumatic, normocephalic. Oropharynx and nasopharynx clear.  NECK:  Supple, no jugular venous distention. No thyroid enlargement, no tenderness.  LUNGS: coarse breath sounds bilaterally, scattered wheezing, no rales,rhonchi or crepitation. No use of accessory muscles of respiration.  CARDIOVASCULAR: S1, S2 normal. No  rubs, or gallops. 2/6 systolic murmur present. ABDOMEN: Soft, obese, some tenderness in the upper abdomen, nondistended. Bowel sounds present. No organomegaly or mass.    EXTREMITIES: No cyanosis, or clubbing. 1+ edema NEUROLOGIC: Cranial nerves II through XII are intact. Muscle strength 5/5 in all extremities. Sensation intact. Gait not checked. Global weakness noted. PSYCHIATRIC: The patient is alert and oriented x 3.  SKIN: No obvious rash, lesion, or ulcer.    LABORATORY PANEL:   CBC  Recent Labs Lab 08/29/16 0450  WBC 7.4  HGB 9.2*  HCT 27.3*  PLT 254   ------------------------------------------------------------------------------------------------------------------  Chemistries   Recent Labs Lab 08/28/16 0748 08/29/16 0450  NA 134* 135  K 4.9 4.0  CL 101 103  CO2 24 25  GLUCOSE 187* 242*  BUN 21* 22*  CREATININE 1.08* 1.25*  CALCIUM 8.4* 7.9*  AST 26  --   ALT 11*  --   ALKPHOS 51  --   BILITOT 0.5  --    ------------------------------------------------------------------------------------------------------------------  Cardiac Enzymes No results for input(s): TROPONINI in the last 168 hours. ------------------------------------------------------------------------------------------------------------------  RADIOLOGY:  Dg Chest Port 1 View  Result Date: 08/28/2016 CLINICAL DATA:  Altered mental status and weakness today. EXAM: PORTABLE CHEST 1 VIEW COMPARISON:  06/30/2016 in FINDINGS: Pacer noted, lead positions unchanged. Upper normal heart size for AP projection. Airway thickening is present, suggesting bronchitis or reactive airways disease. Mildly indistinct pulmonary vasculature, similar to prior. No overt Kerley B-lines. Left mastectomy with axillary clips.  Thoracic spondylosis. IMPRESSION: 1. Airway thickening is present, suggesting bronchitis or reactive airways disease. 2. Mildly indistinct pulmonary vasculature could be a manifestation of pulmonary venous hypertension. No overt edema. Electronically Signed   By: Van Clines M.D.   On: 08/28/2016 08:12    EKG:   Orders placed or performed during the hospital  encounter of 08/28/16  . EKG 12-Lead  . EKG 12-Lead  ASSESSMENT AND PLAN:    83 year old female with past medical history of breast cancer, hypertension, hyperlipidemia, hypothyroidism, paroxysmal atrial fibrillation, diabetes, coronary artery disease presents to the hospital due to chills and malaise cough and wheezing.  1. Sepsis-secondary to flu and noted to have a urinary tract infection. -Continue IV fluids, on Tamiflu and Rocephin.  2. Influenza illness-significant cough is present. Cough medications added. Significant bronchitis present, add azithromycin as well. Continue Tamiflu for influenza illness. -On 2 L oxygen here. Not on home oxygen  3. Upper abdominal pain-CT of the abdomen ordered. Also has acute cystitis for which cultures are pending. Continue Rocephin  4. Diabetes type 2 without complication-continue patient's NovoLog 70/30 and sliding scale insulin. -Place on carb controlled diet and follow blood sugars.  5. Paroxysmal atrial fibrillation-due to chronic anemia, her Coumadin has been discontinued last admission. Continue metoprolol for rate control  6. CK D-baseline creatinine seems to be around 1.2-1.4. when necessary Lasix due to chronic vascular congestion  7. History of coronary artery disease-continue Plavix, metoprolol, lisinopril, Crestor, Ranexa.  8. Hypothyroidism-continue Synthroid.  9. Essential hypertension-continue metoprolol, lisinopril.  10. DVT prophylaxis- on lovenox  Physical Therapy consult when able to   All the records are reviewed and case discussed with Care Management/Social Workerr. Management plans discussed with the patient, family and they are in agreement.  CODE STATUS: Full Code  TOTAL TIME TAKING CARE OF THIS PATIENT: 39 minutes.   POSSIBLE D/C IN 2 DAYS, DEPENDING ON CLINICAL CONDITION.   Gladstone Lighter M.D on 08/29/2016 at 7:51 AM  Between 7am to 6pm - Pager - [phone]  After 6pm go to  [email or web address] - password [email or web address]  Office  [phone]  CC: Primary care physician; Donnie Coffin, MD

## 2016-08-29 NOTE — Progress Notes (Signed)
Inpatient Diabetes Program Recommendations  AACE/ADA: New Consensus Statement on Inpatient Glycemic Control (2015)  Target Ranges:  Prepandial:   less than 140 mg/dL      Peak postprandial:   less than 180 mg/dL (1-2 hours)      Critically ill patients:  140 - 180 mg/dL   Lab Results  Component Value Date   GLUCAP 81 08/29/2016   HGBA1C 6.7 (H) 06/27/2016    Review of Glycemic Control  Results for Regina [name], Regina [name] (MRN [id number]) as of 08/29/2016 13:17  Ref. Range 08/28/2016 16:58 08/28/2016 21:29 08/29/2016 07:42 08/29/2016 11:46  Glucose-Capillary Latest Ref Range: 65 - 99 mg/dL 182 (H) 234 (H) 208 (H) 81    Diabetes history: Type 2 Outpatient Diabetes medications: Novolog mix 28 units bid ordered- med reconsiliation states that patient takes Novolog 70/30 units 55 units qam  Current orders for Inpatient glycemic control: Novolog 70/30 insulin 28 units bid, Novolog 0-9 units tid, Novolog 0-5 units qhs.   Inpatient Diabetes Program Recommendations:   Consider decreasing Novolog 70/30 to 22 units bid (20% reduction of home dose).  Continue Novolog correction as ordered.  Gentry Fitz, RN, BA, MHA, CDE Diabetes Coordinator Inpatient Diabetes Program  [phone] (Team Pager) [phone] ([place]) 08/29/2016 1:22 PM

## 2016-08-29 NOTE — Progress Notes (Signed)
PHARMACY - PHYSICIAN COMMUNICATION CRITICAL VALUE ALERT - BLOOD CULTURE IDENTIFICATION (BCID)  Results for orders placed or performed during the hospital encounter of 08/28/16  Blood Culture ID Panel (Reflexed) (Collected: 08/28/2016  9:45 AM)  Result Value Ref Range   Enterococcus species NOT DETECTED NOT DETECTED   Listeria monocytogenes NOT DETECTED NOT DETECTED   Staphylococcus species DETECTED (A) NOT DETECTED   Staphylococcus aureus NOT DETECTED NOT DETECTED   Methicillin resistance NOT DETECTED NOT DETECTED   Streptococcus species NOT DETECTED NOT DETECTED   Streptococcus agalactiae NOT DETECTED NOT DETECTED   Streptococcus pneumoniae NOT DETECTED NOT DETECTED   Streptococcus pyogenes NOT DETECTED NOT DETECTED   Acinetobacter baumannii NOT DETECTED NOT DETECTED   Enterobacteriaceae species NOT DETECTED NOT DETECTED   Enterobacter cloacae complex NOT DETECTED NOT DETECTED   Escherichia coli NOT DETECTED NOT DETECTED   Klebsiella oxytoca NOT DETECTED NOT DETECTED   Klebsiella pneumoniae NOT DETECTED NOT DETECTED   Proteus species NOT DETECTED NOT DETECTED   Serratia marcescens NOT DETECTED NOT DETECTED   Haemophilus influenzae NOT DETECTED NOT DETECTED   Neisseria meningitidis NOT DETECTED NOT DETECTED   Pseudomonas aeruginosa NOT DETECTED NOT DETECTED   Candida albicans NOT DETECTED NOT DETECTED   Candida glabrata NOT DETECTED NOT DETECTED   Candida krusei NOT DETECTED NOT DETECTED   Candida parapsilosis NOT DETECTED NOT DETECTED   Candida tropicalis NOT DETECTED NOT DETECTED    Name of physician (or Provider) Contacted: Dr. Tressia Miners  Changes to prescribed antibiotics required: Will increase ceftriaxone to 2 g IV daily  Lenis Noon, PharmD, BCPS Clinical Pharmacist 08/29/2016  2:20 PM

## 2016-08-29 NOTE — Evaluation (Signed)
Physical Therapy Evaluation Patient Details Name: Regina [name] MRN: [id number] DOB: 09/17/1933 Today's Date: 08/29/2016   History of Present Illness  Regina [name]  is a 83 y.o. female with a known history of Breast cancer status post left-sided mastectomy, coronary artery disease status post stents, diabetes, hypertension, hyperlipidemia, GERD, hypothyroidism, paroxysmal atrial fibrillation who presents to the hospital due to malaise, body aches and just not feeling well. Patient says that she has not been feeling well now for the past few days to a week. She admits to a cough which is productive of yellow sputum, no chills, nausea, vomiting, abdominal pain, diarrhea. She was having significant weakness and malaise today and therefore came to the ER for further evaluation. In the emergency room patient was noted to be positive for influenza B and also noted to have a urinary tract infection. Patient denies any dysuria, hematuria, frequency or any other associated symptoms presently. Hospitalist services were contacted further treatment and evaluation.  Clinical Impression  Pt admitted with above diagnosis. Pt currently with functional limitations due to the deficits listed below (see PT Problem List).  Pt is easily short of breath with activity. At rest her HR is 110-120 and irregular. RN notified as pt has a history of a-fib. She is able to ambulate to door and back to recliner. She is limited in her ambulation distance at baseline. Pt becomes fatigued, reporting DOE. SaO2 remains at or above 92% on room air. Pt will benefit from skilled PT services to address deficits in strength, balance, and mobility in order to return to full function at home.     Follow Up Recommendations Home health PT;Other (comment) (Resume HH PT for strength, balance, and endurance)    Equipment Recommendations  None recommended by PT    Recommendations for Other Services       Precautions / Restrictions  Precautions Precautions: Fall Precaution Comments: Enteric and droplet precautions Restrictions Weight Bearing Restrictions: No      Mobility  Bed Mobility Overal bed mobility: Modified Independent             General bed mobility comments: Pt requires increased time and use of bed rails but no external assistance  Transfers Overall transfer level: Needs assistance Equipment used: Rolling walker (2 wheeled) Transfers: Sit to/from Stand Sit to Stand: Min guard         General transfer comment: Pt demonstrates fair LE strength and safe sequencing. Good stability once upright in standing with UE support on walker  Ambulation/Gait Ambulation/Gait assistance: Min guard Ambulation Distance (Feet): 20 Feet Assistive device: Rolling walker (2 wheeled) Gait Pattern/deviations: Decreased step length - right;Decreased step length - left Gait velocity: Decreased Gait velocity interpretation: <1.8 ft/sec, indicative of risk for recurrent falls General Gait Details: Pt able to ambulate to door and back to recliner. Decreased step length and speed. Pt demonstrates DOE which she reports is her baseline. Fair stability with use of rolling walker  Stairs            Wheelchair Mobility    Modified Rankin (Stroke Patients Only)       Balance Overall balance assessment: Needs assistance Sitting-balance support: No upper extremity supported Sitting balance-Leahy Scale: Good     Standing balance support: No upper extremity supported Standing balance-Leahy Scale: Fair                               Pertinent Vitals/Pain Pain Assessment: No/denies  pain    Home Living Family/patient expects to be discharged to:: Private residence Living Arrangements: Children (Son) Available Help at Discharge: Family;Available PRN/intermittently Type of Home: House Home Access: Stairs to enter;Ramped entrance Entrance Stairs-Rails: Right Entrance Stairs-Number of Steps:  2 Home Layout: One level Home Equipment: Walker - 2 wheels;Cane - single point;Shower seat;Other (comment) (Lift)      Prior Function Level of Independence: Independent with assistive device(s)         Comments: ambulatory minimal household distances with RW.      Hand Dominance   Dominant Hand: Right    Extremity/Trunk Assessment   Upper Extremity Assessment Upper Extremity Assessment: Overall WFL for tasks assessed    Lower Extremity Assessment Lower Extremity Assessment: Generalized weakness (General deconditioning)       Communication   Communication: No difficulties  Cognition Arousal/Alertness: Awake/alert Behavior During Therapy: WFL for tasks assessed/performed Overall Cognitive Status: Within Functional Limits for tasks assessed                      General Comments      Exercises     Assessment/Plan    PT Assessment Patient needs continued PT services  PT Problem List Decreased strength;Decreased activity tolerance;Decreased balance;Decreased knowledge of use of DME;Obesity          PT Treatment Interventions DME instruction;Gait training;Functional mobility training;Therapeutic activities;Therapeutic exercise;Balance training;Neuromuscular re-education;Patient/family education    PT Goals (Current goals can be found in the Care Plan section)  Acute Rehab PT Goals Patient Stated Goal: Return to prior level of function at home PT Goal Formulation: With patient Time For Goal Achievement: 09/12/16 Potential to Achieve Goals: Good    Frequency Min 2X/week   Barriers to discharge        Co-evaluation               End of Session Equipment Utilized During Treatment: Gait belt;Oxygen Activity Tolerance: Patient tolerated treatment well Patient left: in chair;with call bell/phone within reach;with chair alarm set Nurse Communication: Mobility status         Time: 1204-1228 PT Time Calculation (min) (ACUTE ONLY): 24  min   Charges:   PT Evaluation $PT Eval Low Complexity: 1 Procedure PT Treatments $Gait Training: 8-22 mins   PT G Codes:       Lyndel Safe Laysa Kimmey PT, DPT   Wendee Hata 08/29/2016, 1:19 PM

## 2016-08-29 NOTE — Progress Notes (Signed)
Nursing supervisor aware of need of IV access; will attempt to obtain due to previous difficulty. Madlyn Frankel, RN

## 2016-08-30 LAB — GLUCOSE, CAPILLARY
GLUCOSE-CAPILLARY: 94 mg/dL (ref 65–99)
GLUCOSE-CAPILLARY: 163 mg/dL — AB (ref 65–99)
Glucose-Capillary: 211 mg/dL — ABNORMAL HIGH (ref 65–99)
Glucose-Capillary: 178 mg/dL — ABNORMAL HIGH (ref 65–99)

## 2016-08-30 LAB — CBC
HCT: 29.7 % — ABNORMAL LOW (ref 35.0–47.0)
Hemoglobin: 9.9 g/dL — ABNORMAL LOW (ref 12.0–16.0)
MCH: 29.4 pg (ref 26.0–34.0)
MCHC: 33.4 g/dL (ref 32.0–36.0)
MCV: 88 fL (ref 80.0–100.0)
PLATELETS: 286 10*3/uL (ref 150–440)
RBC: 3.37 MIL/uL — AB (ref 3.80–5.20)
RDW: 15.2 % — AB (ref 11.5–14.5)
WBC: 7.8 10*3/uL (ref 3.6–11.0)

## 2016-08-30 LAB — BASIC METABOLIC PANEL
ANION GAP: 7 (ref 5–15)
BUN: 17 mg/dL (ref 6–20)
CALCIUM: 8.1 mg/dL — AB (ref 8.9–10.3)
CO2: 26 mmol/L (ref 22–32)
Chloride: 103 mmol/L (ref 101–111)
Creatinine, Ser: 1.05 mg/dL — ABNORMAL HIGH (ref 0.44–1.00)
GFR, EST AFRICAN AMERICAN: 57 mL/min — AB (ref >60)
GFR, EST NON AFRICAN AMERICAN: 49 mL/min — AB (ref >60)
Glucose, Bld: 74 mg/dL (ref 65–99)
POTASSIUM: 3.3 mmol/L — AB (ref 3.5–5.1)
Sodium: 136 mmol/L (ref 135–145)

## 2016-08-30 LAB — URINE CULTURE

## 2016-08-30 MED ORDER — ENOXAPARIN SODIUM 40 MG/0.4ML ~~LOC~~ SOLN
40.0000 mg | Freq: Two times a day (BID) | SUBCUTANEOUS | Status: DC
  Administered 2016-08-31 – 2016-09-02 (×5): 40 mg via SUBCUTANEOUS
  Filled 2016-08-30 (×6): qty 0.4

## 2016-08-30 MED ORDER — METOPROLOL TARTRATE 25 MG PO TABS
25.0000 mg | ORAL_TABLET | Freq: Two times a day (BID) | ORAL | Status: DC
  Administered 2016-08-30 – 2016-09-01 (×5): 25 mg via ORAL
  Filled 2016-08-30 (×5): qty 1

## 2016-08-30 MED ORDER — METOPROLOL TARTRATE 50 MG PO TABS
50.0000 mg | ORAL_TABLET | Freq: Two times a day (BID) | ORAL | Status: DC
  Filled 2016-08-30: qty 1

## 2016-08-30 MED ORDER — GLUCERNA SHAKE PO LIQD
237.0000 mL | Freq: Three times a day (TID) | ORAL | Status: DC
  Administered 2016-08-30 – 2016-09-01 (×9): 237 mL via ORAL

## 2016-08-30 MED ORDER — POTASSIUM CHLORIDE CRYS ER 20 MEQ PO TBCR
40.0000 meq | EXTENDED_RELEASE_TABLET | ORAL | Status: AC
  Administered 2016-08-30 (×2): 40 meq via ORAL
  Filled 2016-08-30 (×2): qty 2

## 2016-08-30 MED ORDER — METOPROLOL TARTRATE 5 MG/5ML IV SOLN
5.0000 mg | Freq: Once | INTRAVENOUS | Status: AC
  Administered 2016-08-30: 07:00:00 5 mg via INTRAVENOUS
  Filled 2016-08-30: qty 5

## 2016-08-30 MED ORDER — INSULIN ASPART PROT & ASPART (70-30 MIX) 100 UNIT/ML ~~LOC~~ SUSP: 20.0000 [IU] | Freq: Two times a day (BID) | SUBCUTANEOUS | Status: DC

## 2016-08-30 NOTE — Consult Note (Signed)
[facility] Infectious Disease     Reason for Consult: Flu, C diff UTI   Referring Physician: Claria Dice Date of Admission:  08/28/2016   Active Problems:   Sepsis [facility])   HPI: Daanya Lanphier Bowe is a 83 y.o. female admitted with malaise, cough body aches for a few days. Flu B + as well as C diff + and UTI with TNTC WBC and UCX + Klebsiella. 1/2 BC also + CNS CT abd showed small amt free air and possible RLL infiltrate   Past Medical History:  Diagnosis Date  . Breast cancer, left [facility]) 2013   Mastectomy.   . CAD (coronary artery disease)    s/p cath in 2009 with 2 stents  . Diabetes mellitus without complication ([place])   . GERD (gastroesophageal reflux disease)   . HLD (hyperlipidemia)   . Hypertension   . Hypothyroidism   . Paroxysmal atrial fibrillation [facility])    Past Surgical History:  Procedure Laterality Date  . BREAST LUMPECTOMY    . CHOLECYSTECTOMY    . MASTECTOMY Left    Social History  Substance Use Topics  . Smoking status: Never Smoker  . Smokeless tobacco: Never Used  . Alcohol use No   Family History  Problem Relation Age of Onset  . Kidney failure Mother   . Lung cancer Father     Allergies: No Known Allergies  Current antibiotics: Antibiotics Given (last 72 hours)    Date/Time Action Medication Dose Rate   08/28/16 1008 Given   cefTRIAXone (ROCEPHIN) injection 1 g 1 g    08/28/16 1053 Given   oseltamivir (TAMIFLU) capsule 75 mg 75 mg    08/29/16 1154 Given   oseltamivir (TAMIFLU) capsule 30 mg 30 mg    08/29/16 1155 Given   cefTRIAXone (ROCEPHIN) IVPB 1 g 1 g 100 mL/hr   08/29/16 1813 Given   cefTRIAXone (ROCEPHIN) IVPB 1 g 1 g 100 mL/hr   08/29/16 1947 Given   vancomycin (VANCOCIN) 50 mg/mL oral solution 125 mg 125 mg    08/29/16 2306 Given   oseltamivir (TAMIFLU) capsule 30 mg 30 mg    08/30/16 0122 Given   vancomycin (VANCOCIN) 50 mg/mL oral solution 125 mg 125 mg    08/30/16 0606 Given   vancomycin (VANCOCIN) 50 mg/mL oral solution  125 mg 125 mg    08/30/16 0953 Given   oseltamivir (TAMIFLU) capsule 30 mg 30 mg       MEDICATIONS: . benzonatate  100 mg Oral TID  . cefTRIAXone  2 g Intravenous Q24H  . clopidogrel  75 mg Oral Daily  . enoxaparin (LOVENOX) injection  40 mg Subcutaneous Q24H  . feeding supplement (GLUCERNA SHAKE)  237 mL Oral TID BM  . furosemide  20 mg Oral Daily  . guaiFENesin  600 mg Oral BID  . insulin aspart  0-5 Units Subcutaneous QHS  . insulin aspart  0-9 Units Subcutaneous TID WC  . ipratropium  2 spray Each Nare TID  . levothyroxine  50 mcg Oral QAC breakfast  . metoprolol tartrate  25 mg Oral BID  . ondansetron (ZOFRAN) IV  4 mg Intravenous Once  . oseltamivir  30 mg Oral BID  . pantoprazole  40 mg Oral Daily  . paricalcitol  1 mcg Oral Once per [place] on Mon Wed Fri  . potassium chloride  40 mEq Oral Q4H  . ranolazine  500 mg Oral BID  . rosuvastatin  10 mg Oral Daily  . vancomycin  125 mg  Oral Q6H    Review of Systems - 11 systems reviewed and negative per HPI   OBJECTIVE: Temp:  [97.4 F (36.3 C)-98.4 F (36.9 C)] 98.3 F (36.8 C) (01/19 0629) Pulse Rate:  [92-157] 121 (01/19 0946) Resp:  [16-20] 20 (01/19 0946) BP: (92-138)/(48-82) 95/56 (01/19 0950) SpO2:  [95 %-100 %] 95 % (01/19 0946) Physical Exam  Constitutional: frail, disoriented  HENT: /AT, PERRLA, no scleral icterus Mouth/Throat: Oropharynx is clear and dry  Cardiovascular: Normal rate, regular rhythm and normal heart sounds. Pulmonary/Chest: rhonchi ant  Neck = supple, no nuchal rigidity Abdominal: Soft. Bowel sounds are normal.  exhibits no distension. There is no tenderness.  Lymphadenopathy: no cervical adenopathy. No axillary adenopathy Neurological: lethargic Skin: Skin is warm and dry. No rash noted. No erythema.  Psychiatric: a normal mood and affect.  behavior is normal.    LABS: Results for orders placed or performed during the hospital encounter of 08/28/16 (from the past 48 hour(s))  Culture,  blood (routine x 2)     Status: None (Preliminary result)   Collection Time: 08/28/16 12:00 PM  Result Value Ref Range   Specimen Description BLOOD RIGHT AC    Special Requests      BOTTLES DRAWN AEROBIC AND ANAEROBIC AER 1ML ANA 1ML   Culture NO GROWTH 2 DAYS    Report Status PENDING   Lactic acid, plasma     Status: None   Collection Time: 08/28/16 12:14 PM  Result Value Ref Range   Lactic Acid, Venous 0.9 0.5 - 1.9 mmol/L  Brain natriuretic peptide     Status: Abnormal   Collection Time: 08/28/16 12:14 PM  Result Value Ref Range   B Natriuretic Peptide 507.0 (H) 0.0 - 100.0 pg/mL  CBC with Differential/Platelet     Status: Abnormal   Collection Time: 08/28/16 12:14 PM  Result Value Ref Range   WBC 5.4 3.6 - 11.0 K/uL   RBC 2.92 (L) 3.80 - 5.20 MIL/uL   Hemoglobin 8.7 (L) 12.0 - 16.0 g/dL   HCT 25.6 (L) 35.0 - 47.0 %   MCV 87.5 80.0 - 100.0 fL   MCH 29.8 26.0 - 34.0 pg   MCHC 34.1 32.0 - 36.0 g/dL   RDW 14.7 (H) 11.5 - 14.5 %   Platelets 274 150 - 440 K/uL   Neutrophils Relative % 84 %   Neutro Abs 4.5 1.4 - 6.5 K/uL   Lymphocytes Relative 6 %   Lymphs Abs 0.3 (L) 1.0 - 3.6 K/uL   Monocytes Relative 10 %   Monocytes Absolute 0.5 0.2 - 0.9 K/uL   Eosinophils Relative 0 %   Eosinophils Absolute 0.0 0 - 0.7 K/uL   Basophils Relative 0 %   Basophils Absolute 0.0 0 - 0.1 K/uL  Lactic acid, plasma     Status: None   Collection Time: 08/28/16  3:37 PM  Result Value Ref Range   Lactic Acid, Venous 1.4 0.5 - 1.9 mmol/L  Glucose, capillary     Status: Abnormal   Collection Time: 08/28/16  4:58 PM  Result Value Ref Range   Glucose-Capillary 182 (H) 65 - 99 mg/dL  Glucose, capillary     Status: Abnormal   Collection Time: 08/28/16  9:29 PM  Result Value Ref Range   Glucose-Capillary 234 (H) 65 - 99 mg/dL  Gastrointestinal Panel by PCR , Stool     Status: None   Collection Time: 08/29/16  3:15 AM  Result Value Ref Range   Campylobacter species NOT  DETECTED NOT DETECTED    Plesimonas shigelloides NOT DETECTED NOT DETECTED   Salmonella species NOT DETECTED NOT DETECTED   Yersinia enterocolitica NOT DETECTED NOT DETECTED   Vibrio species NOT DETECTED NOT DETECTED   Vibrio cholerae NOT DETECTED NOT DETECTED   Enteroaggregative E coli (EAEC) NOT DETECTED NOT DETECTED   Enteropathogenic E coli (EPEC) NOT DETECTED NOT DETECTED   Enterotoxigenic E coli (ETEC) NOT DETECTED NOT DETECTED   Shiga like toxin producing E coli (STEC) NOT DETECTED NOT DETECTED   Shigella/Enteroinvasive E coli (EIEC) NOT DETECTED NOT DETECTED   Cryptosporidium NOT DETECTED NOT DETECTED   Cyclospora cayetanensis NOT DETECTED NOT DETECTED   Entamoeba histolytica NOT DETECTED NOT DETECTED   Giardia lamblia NOT DETECTED NOT DETECTED   Adenovirus F40/41 NOT DETECTED NOT DETECTED   Astrovirus NOT DETECTED NOT DETECTED   Norovirus GI/GII NOT DETECTED NOT DETECTED   Rotavirus A NOT DETECTED NOT DETECTED   Sapovirus (I, II, IV, and V) NOT DETECTED NOT DETECTED  C difficile quick scan w PCR reflex     Status: Abnormal   Collection Time: 08/29/16  3:15 AM  Result Value Ref Range   C Diff antigen POSITIVE (A) NEGATIVE   C Diff toxin POSITIVE (A) NEGATIVE   C Diff interpretation Toxin producing C. difficile detected.     Comment: CRITICAL RESULT CALLED TO, READ BACK BY AND VERIFIED WITH: SHAY BREWER AT 1619 ON 08/29/2016 JLJ   Basic metabolic panel     Status: Abnormal   Collection Time: 08/29/16  4:50 AM  Result Value Ref Range   Sodium 135 135 - 145 mmol/L   Potassium 4.0 3.5 - 5.1 mmol/L   Chloride 103 101 - 111 mmol/L   CO2 25 22 - 32 mmol/L   Glucose, Bld 242 (H) 65 - 99 mg/dL   BUN 22 (H) 6 - 20 mg/dL   Creatinine, Ser 1.25 (H) 0.44 - 1.00 mg/dL   Calcium 7.9 (L) 8.9 - 10.3 mg/dL   GFR calc non Af Amer 40 (L) >60 mL/min   GFR calc Af Amer 46 (L) >60 mL/min    Comment: (NOTE) The eGFR has been calculated using the CKD EPI equation. This calculation has not been validated in all  clinical situations. eGFR's persistently <60 mL/min signify possible Chronic Kidney Disease.    Anion gap 7 5 - 15  CBC     Status: Abnormal   Collection Time: 08/29/16  4:50 AM  Result Value Ref Range   WBC 7.4 3.6 - 11.0 K/uL   RBC 3.08 (L) 3.80 - 5.20 MIL/uL   Hemoglobin 9.2 (L) 12.0 - 16.0 g/dL   HCT 27.3 (L) 35.0 - 47.0 %   MCV 88.7 80.0 - 100.0 fL   MCH 30.1 26.0 - 34.0 pg   MCHC 33.9 32.0 - 36.0 g/dL   RDW 15.1 (H) 11.5 - 14.5 %   Platelets 254 150 - 440 K/uL  Glucose, capillary     Status: Abnormal   Collection Time: 08/29/16  7:42 AM  Result Value Ref Range   Glucose-Capillary 208 (H) 65 - 99 mg/dL  Glucose, capillary     Status: None   Collection Time: 08/29/16 11:46 AM  Result Value Ref Range   Glucose-Capillary 81 65 - 99 mg/dL  Glucose, capillary     Status: Abnormal   Collection Time: 08/29/16  1:41 PM  Result Value Ref Range   Glucose-Capillary 57 (L) 65 - 99 mg/dL  Glucose, capillary  Status: Abnormal   Collection Time: 08/29/16  1:54 PM  Result Value Ref Range   Glucose-Capillary 60 (L) 65 - 99 mg/dL  Glucose, capillary     Status: Abnormal   Collection Time: 08/29/16  2:11 PM  Result Value Ref Range   Glucose-Capillary 57 (L) 65 - 99 mg/dL  Glucose, capillary     Status: None   Collection Time: 08/29/16  2:26 PM  Result Value Ref Range   Glucose-Capillary 71 65 - 99 mg/dL  Glucose, capillary     Status: Abnormal   Collection Time: 08/29/16  5:04 PM  Result Value Ref Range   Glucose-Capillary 118 (H) 65 - 99 mg/dL  Glucose, capillary     Status: None   Collection Time: 08/29/16  9:47 PM  Result Value Ref Range   Glucose-Capillary 91 65 - 99 mg/dL  CBC     Status: Abnormal   Collection Time: 08/30/16  4:42 AM  Result Value Ref Range   WBC 7.8 3.6 - 11.0 K/uL   RBC 3.37 (L) 3.80 - 5.20 MIL/uL   Hemoglobin 9.9 (L) 12.0 - 16.0 g/dL   HCT 29.7 (L) 35.0 - 47.0 %   MCV 88.0 80.0 - 100.0 fL   MCH 29.4 26.0 - 34.0 pg   MCHC 33.4 32.0 - 36.0 g/dL    RDW 15.2 (H) 11.5 - 14.5 %   Platelets 286 150 - 440 K/uL  Basic metabolic panel     Status: Abnormal   Collection Time: 08/30/16  4:42 AM  Result Value Ref Range   Sodium 136 135 - 145 mmol/L   Potassium 3.3 (L) 3.5 - 5.1 mmol/L   Chloride 103 101 - 111 mmol/L   CO2 26 22 - 32 mmol/L   Glucose, Bld 74 65 - 99 mg/dL   BUN 17 6 - 20 mg/dL   Creatinine, Ser 1.05 (H) 0.44 - 1.00 mg/dL   Calcium 8.1 (L) 8.9 - 10.3 mg/dL   GFR calc non Af Amer 49 (L) >60 mL/min   GFR calc Af Amer 57 (L) >60 mL/min    Comment: (NOTE) The eGFR has been calculated using the CKD EPI equation. This calculation has not been validated in all clinical situations. eGFR's persistently <60 mL/min signify possible Chronic Kidney Disease.    Anion gap 7 5 - 15  Glucose, capillary     Status: None   Collection Time: 08/30/16  8:39 AM  Result Value Ref Range   Glucose-Capillary 94 65 - 99 mg/dL   No components found for: ESR, C REACTIVE PROTEIN MICRO: Recent Results (from the past 720 hour(s))  Urine culture     Status: Abnormal   Collection Time: 08/28/16  7:19 AM  Result Value Ref Range Status   Specimen Description URINE, RANDOM  Final   Special Requests NONE  Final   Culture >=100,000 COLONIES/mL KLEBSIELLA OXYTOCA (A)  Final   Report Status 08/30/2016 FINAL  Final   Organism ID, Bacteria KLEBSIELLA OXYTOCA (A)  Final      Susceptibility   Klebsiella oxytoca - MIC*    AMPICILLIN 8 RESISTANT Resistant     CEFAZOLIN <=4 SENSITIVE Sensitive     CEFTRIAXONE <=1 SENSITIVE Sensitive     CIPROFLOXACIN <=0.25 SENSITIVE Sensitive     GENTAMICIN <=1 SENSITIVE Sensitive     IMIPENEM <=0.25 SENSITIVE Sensitive     NITROFURANTOIN 32 SENSITIVE Sensitive     TRIMETH/SULFA <=20 SENSITIVE Sensitive     AMPICILLIN/SULBACTAM 4 SENSITIVE Sensitive  PIP/TAZO <=4 SENSITIVE Sensitive     Extended ESBL NEGATIVE Sensitive     * >=100,000 COLONIES/mL KLEBSIELLA OXYTOCA  Culture, blood (routine x 2)     Status: None  (Preliminary result)   Collection Time: 08/28/16  9:45 AM  Result Value Ref Range Status   Specimen Description BLOOD R AC  Final   Special Requests   Final    BOTTLES DRAWN AEROBIC AND ANAEROBIC AER 6ML ANA 5ML   Culture  Setup Time   Final    Organism ID to follow GRAM POSITIVE COCCI ANAEROBIC BOTTLE ONLY CRITICAL RESULT CALLED TO, READ BACK BY AND VERIFIED WITH: KAREN HAYES AT 1341 08/29/16 SDR    Culture GRAM POSITIVE COCCI  Final   Report Status PENDING  Incomplete  Blood Culture ID Panel (Reflexed)     Status: Abnormal   Collection Time: 08/28/16  9:45 AM  Result Value Ref Range Status   Enterococcus species NOT DETECTED NOT DETECTED Final   Listeria monocytogenes NOT DETECTED NOT DETECTED Final   Staphylococcus species DETECTED (A) NOT DETECTED Final    Comment: CRITICAL RESULT CALLED TO, READ BACK BY AND VERIFIED WITH:  KAREN HAYES AT 1341 08/29/16 SDR    Staphylococcus aureus NOT DETECTED NOT DETECTED Final   Methicillin resistance NOT DETECTED NOT DETECTED Final   Streptococcus species NOT DETECTED NOT DETECTED Final   Streptococcus agalactiae NOT DETECTED NOT DETECTED Final   Streptococcus pneumoniae NOT DETECTED NOT DETECTED Final   Streptococcus pyogenes NOT DETECTED NOT DETECTED Final   Acinetobacter baumannii NOT DETECTED NOT DETECTED Final   Enterobacteriaceae species NOT DETECTED NOT DETECTED Final   Enterobacter cloacae complex NOT DETECTED NOT DETECTED Final   Escherichia coli NOT DETECTED NOT DETECTED Final   Klebsiella oxytoca NOT DETECTED NOT DETECTED Final   Klebsiella pneumoniae NOT DETECTED NOT DETECTED Final   Proteus species NOT DETECTED NOT DETECTED Final   Serratia marcescens NOT DETECTED NOT DETECTED Final   Haemophilus influenzae NOT DETECTED NOT DETECTED Final   Neisseria meningitidis NOT DETECTED NOT DETECTED Final   Pseudomonas aeruginosa NOT DETECTED NOT DETECTED Final   Candida albicans NOT DETECTED NOT DETECTED Final   Candida glabrata NOT  DETECTED NOT DETECTED Final   Candida krusei NOT DETECTED NOT DETECTED Final   Candida parapsilosis NOT DETECTED NOT DETECTED Final   Candida tropicalis NOT DETECTED NOT DETECTED Final  Culture, blood (routine x 2)     Status: None (Preliminary result)   Collection Time: 08/28/16 12:00 PM  Result Value Ref Range Status   Specimen Description BLOOD RIGHT AC  Final   Special Requests   Final    BOTTLES DRAWN AEROBIC AND ANAEROBIC AER 1ML ANA 1ML   Culture NO GROWTH 2 DAYS  Final   Report Status PENDING  Incomplete  Gastrointestinal Panel by PCR , Stool     Status: None   Collection Time: 08/29/16  3:15 AM  Result Value Ref Range Status   Campylobacter species NOT DETECTED NOT DETECTED Final   Plesimonas shigelloides NOT DETECTED NOT DETECTED Final   Salmonella species NOT DETECTED NOT DETECTED Final   Yersinia enterocolitica NOT DETECTED NOT DETECTED Final   Vibrio species NOT DETECTED NOT DETECTED Final   Vibrio cholerae NOT DETECTED NOT DETECTED Final   Enteroaggregative E coli (EAEC) NOT DETECTED NOT DETECTED Final   Enteropathogenic E coli (EPEC) NOT DETECTED NOT DETECTED Final   Enterotoxigenic E coli (ETEC) NOT DETECTED NOT DETECTED Final   Shiga like toxin producing E coli (  STEC) NOT DETECTED NOT DETECTED Final   Shigella/Enteroinvasive E coli (EIEC) NOT DETECTED NOT DETECTED Final   Cryptosporidium NOT DETECTED NOT DETECTED Final   Cyclospora cayetanensis NOT DETECTED NOT DETECTED Final   Entamoeba histolytica NOT DETECTED NOT DETECTED Final   Giardia lamblia NOT DETECTED NOT DETECTED Final   Adenovirus F40/41 NOT DETECTED NOT DETECTED Final   Astrovirus NOT DETECTED NOT DETECTED Final   Norovirus GI/GII NOT DETECTED NOT DETECTED Final   Rotavirus A NOT DETECTED NOT DETECTED Final   Sapovirus (I, II, IV, and V) NOT DETECTED NOT DETECTED Final  C difficile quick scan w PCR reflex     Status: Abnormal   Collection Time: 08/29/16  3:15 AM  Result Value Ref Range Status   C  Diff antigen POSITIVE (A) NEGATIVE Final   C Diff toxin POSITIVE (A) NEGATIVE Final   C Diff interpretation Toxin producing C. difficile detected.  Final    Comment: CRITICAL RESULT CALLED TO, READ BACK BY AND VERIFIED WITH: SHAY BREWER AT 1619 ON 08/29/2016 JLJ     IMAGING: Ct Abdomen W Contrast  Result Date: 08/29/2016 CLINICAL DATA:  Abdominal pain EXAM: CT ABDOMEN WITH CONTRAST TECHNIQUE: Multidetector CT imaging of the abdomen was performed using the standard protocol following bolus administration of intravenous contrast. CONTRAST:  50m ISOVUE-300 IOPAMIDOL (ISOVUE-300) INJECTION 61% COMPARISON:  Abdomen and pelvis CT from 07/03/2012 FINDINGS: Lower chest: 2.4 cm irregular opacity in the right lower lobe is assisted with other ill-defined nodular right lower lobe opacity in a central peribronchovascular distribution. There is bibasilar atelectasis. Hepatobiliary: No focal abnormality within the liver parenchyma. Gallbladder surgically absent. No intrahepatic or extrahepatic biliary dilation. Pancreas: Pancreas is diffusely atrophic. Spleen: No splenomegaly. No focal mass lesion. Adrenals/Urinary Tract: No adrenal nodule or mass. Right kidney shows cortical thinning without mass lesion or hydronephrosis. 2.3 x 1.7 cm lesion interpolar left kidney measures water attenuation and was present previously measuring 2.0 cm. Imaging features are most compatible with a renal cyst. No left hydronephrosis. Stomach/Bowel: Stomach is nondistended. No gastric wall thickening. No evidence of outlet obstruction. Duodenum is normally positioned as is the ligament of Treitz. Visualize small bowel loops and colonic segments of the abdomen show no evidence for obstruction. Vascular/Lymphatic: There is abdominal aortic atherosclerosis without aneurysm. There is no gastrohepatic or hepatoduodenal ligament lymphadenopathy. No intraperitoneal or retroperitoneal lymphadenopathy. Other: Small volume perihepatic free fluid is  associated with some fluid around both kidneys. Musculoskeletal: Bone windows reveal no worrisome lytic or sclerotic osseous lesions. IMPRESSION: 1. Abdomen only exam shows small amount of intraperitoneal free fluid of indeterminate etiology. If warranted, CT imaging of the pelvis could be performed to assess for a source. 2. Ill-defined nodular airspace disease right lower lobe including a 2.4 cm focus of irregular airspace opacity. Imaging features probably related to right lower lobe pneumonia and aspiration could have this appearance in the appropriate clinical setting. Follow-up imaging recommended to ensure resolution of the dominant nodular density. 3.  Abdominal Aortic Atherosclerois (ICD10-170.0) 4. 2.3 cm left renal cyst. Electronically Signed   By: EMisty StanleyM.D.   On: 08/29/2016 11:26   Dg Chest Port 1 View  Result Date: 08/28/2016 CLINICAL DATA:  Altered mental status and weakness today. EXAM: PORTABLE CHEST 1 VIEW COMPARISON:  06/30/2016 in FINDINGS: Pacer noted, lead positions unchanged. Upper normal heart size for AP projection. Airway thickening is present, suggesting bronchitis or reactive airways disease. Mildly indistinct pulmonary vasculature, similar to prior. No overt Kerley B-lines. Left mastectomy with  axillary clips.  Thoracic spondylosis. IMPRESSION: 1. Airway thickening is present, suggesting bronchitis or reactive airways disease. 2. Mildly indistinct pulmonary vasculature could be a manifestation of pulmonary venous hypertension. No overt edema. Electronically Signed   By: Van Clines M.D.   On: 08/28/2016 08:12    Assessment:   Elana Jian Kealey is a 83 y.o. female with PNA, UTI and C diff.  [place] + 1/2 CNS is not significant likely.  CUrrently [place] 3 of cefftriaxone tamifly and Ron 2 of oral vanco.  Recommendations Cont 5 K[place]il course of tamiflu Would continue ceftriaxone for a total of 5 days - this will cover possible bacterial PNA and UTI  Will need 10 days total of  vanco orally once she finished the ceftriaxone.   Thank you very much for allowing me to participate in the care of this patient. Please call with questions.   Cheral Marker. Ola Spurr, MD

## 2016-08-30 NOTE — Care Management Important Message (Signed)
Important Message  Patient Details  Name: IYARI CAVICCHI MRN: [id number] Date of Birth: 17-Feb-1934   Medicare Important Message Given:  Yes Copy of signed IM delivered to patient.    Marshell Garfinkel, RN 08/30/2016, 8:45 AM

## 2016-08-30 NOTE — Progress Notes (Signed)
[facility] [place] NAME: Regina [name]    MR#:  [id number]  DATE OF BIRTH:  1933/08/14  SUBJECTIVE:  CHIEF COMPLAINT:   Chief Complaint  Patient presents with  . Influenza  . Fever   - has coarse cough, feels sick - c.diff positive, abdominal pain improving  REVIEW OF SYSTEMS:  Review of Systems  Constitutional: Positive for malaise/fatigue. Negative for chills and fever.  HENT: Negative for congestion, ear discharge, hearing loss and nosebleeds.   Eyes: Negative for blurred vision and double vision.  Respiratory: Positive for cough and shortness of breath. Negative for wheezing.   Cardiovascular: Negative for chest pain and palpitations.  Gastrointestinal: Positive for abdominal pain and diarrhea. Negative for constipation, nausea and vomiting.  Genitourinary: Positive for dysuria and urgency.  Musculoskeletal: Positive for myalgias.  Neurological: Negative for dizziness, sensory change, speech change, seizures and headaches.  Psychiatric/Behavioral: Negative for depression.    DRUG ALLERGIES:  No Known Allergies  VITALS:  Blood pressure 124/76, pulse (!) 110, temperature 98.3 F (36.8 C), temperature source Oral, resp. rate 16, height 4\' 11"  (1.499 m), weight 105.2 kg (232 lb), SpO2 98 %.  PHYSICAL EXAMINATION:  Physical Exam  GENERAL:  83 y.o.-year-old obese patient lying in the bed. Appears very uncomfortable EYES: Pupils equal, round, reactive to light and accommodation. No scleral icterus. Extraocular muscles intact.  HEENT: Head atraumatic, normocephalic. Oropharynx and nasopharynx clear.  NECK:  Supple, no jugular venous distention. No thyroid enlargement, no tenderness.  LUNGS: coarse breath sounds bilaterally, scattered wheezing, no rales,rhonchi or crepitation. No use of accessory muscles of respiration.  CARDIOVASCULAR: S1, S2 normal. No  rubs, or gallops. 2/6 systolic murmur present. ABDOMEN: Soft, obese, some  tenderness in the upper abdomen, nondistended. Bowel sounds present. No organomegaly or mass.  EXTREMITIES: No cyanosis, or clubbing. 1+ edema NEUROLOGIC: Cranial nerves II through XII are intact. Muscle strength 5/5 in all extremities. Sensation intact. Gait not checked. Global weakness noted. PSYCHIATRIC: The patient is alert and oriented x 3.  SKIN: No obvious rash, lesion, or ulcer.    LABORATORY PANEL:   CBC  Recent Labs Lab 08/30/16 0442  WBC 7.8  HGB 9.9*  HCT 29.7*  PLT 286   ------------------------------------------------------------------------------------------------------------------  Chemistries   Recent Labs Lab 08/28/16 0748  08/30/16 0442  NA 134*  < > 136  K 4.9  < > 3.3*  CL 101  < > 103  CO2 24  < > 26  GLUCOSE 187*  < > 74  BUN 21*  < > 17  CREATININE 1.08*  < > 1.05*  CALCIUM 8.4*  < > 8.1*  AST 26  --   --   ALT 11*  --   --   ALKPHOS 51  --   --   BILITOT 0.5  --   --   < > = values in this interval not displayed. ------------------------------------------------------------------------------------------------------------------  Cardiac Enzymes No results for input(s): TROPONINI in the last 168 hours. ------------------------------------------------------------------------------------------------------------------  RADIOLOGY:  Ct Abdomen W Contrast  Result Date: 08/29/2016 CLINICAL DATA:  Abdominal pain EXAM: CT ABDOMEN WITH CONTRAST TECHNIQUE: Multidetector CT imaging of the abdomen was performed using the standard protocol following bolus administration of intravenous contrast. CONTRAST:  74mL ISOVUE-300 IOPAMIDOL (ISOVUE-300) INJECTION 61% COMPARISON:  Abdomen and pelvis CT from 07/03/2012 FINDINGS: Lower chest: 2.4 cm irregular opacity in the right lower lobe is assisted with other ill-defined nodular right lower lobe opacity in a central peribronchovascular  distribution. There is bibasilar atelectasis. Hepatobiliary: No focal abnormality  within the liver parenchyma. Gallbladder surgically absent. No intrahepatic or extrahepatic biliary dilation. Pancreas: Pancreas is diffusely atrophic. Spleen: No splenomegaly. No focal mass lesion. Adrenals/Urinary Tract: No adrenal nodule or mass. Right kidney shows cortical thinning without mass lesion or hydronephrosis. 2.3 x 1.7 cm lesion interpolar left kidney measures water attenuation and was present previously measuring 2.0 cm. Imaging features are most compatible with a renal cyst. No left hydronephrosis. Stomach/Bowel: Stomach is nondistended. No gastric wall thickening. No evidence of outlet obstruction. Duodenum is normally positioned as is the ligament of Treitz. Visualize small bowel loops and colonic segments of the abdomen show no evidence for obstruction. Vascular/Lymphatic: There is abdominal aortic atherosclerosis without aneurysm. There is no gastrohepatic or hepatoduodenal ligament lymphadenopathy. No intraperitoneal or retroperitoneal lymphadenopathy. Other: Small volume perihepatic free fluid is associated with some fluid around both kidneys. Musculoskeletal: Bone windows reveal no worrisome lytic or sclerotic osseous lesions. IMPRESSION: 1. Abdomen only exam shows small amount of intraperitoneal free fluid of indeterminate etiology. If warranted, CT imaging of the pelvis could be performed to assess for a source. 2. Ill-defined nodular airspace disease right lower lobe including a 2.4 cm focus of irregular airspace opacity. Imaging features probably related to right lower lobe pneumonia and aspiration could have this appearance in the appropriate clinical setting. Follow-up imaging recommended to ensure resolution of the dominant nodular density. 3.  Abdominal Aortic Atherosclerois (ICD10-170.0) 4. 2.3 cm left renal cyst. Electronically Signed   By: Misty Stanley M.D.   On: 08/29/2016 11:26    EKG:   Orders placed or performed during the hospital encounter of 08/28/16  . EKG 12-Lead    . EKG 12-Lead    ASSESSMENT AND PLAN:    83 year old female with past medical history of breast cancer, hypertension, hyperlipidemia, hypothyroidism, paroxysmal atrial fibrillation, diabetes, coronary artery disease presents to the hospital due to chills and malaise cough and wheezing.  1. Sepsis-secondary to flu and noted to have a urinary tract infection. -fluids stopped, on Tamiflu and Rocephin. - Added cultures growing staph, BCID negative for methicillin resistance. Rocephin dose adjusted. -Infectious disease consulted  2. Influenza illness-significant cough is present. Cough medications added. Significant bronchitis present, Continue Tamiflu for influenza illness. On rocephin -On 2 L oxygen here. Not on home oxygen  3. C.diff colitis- on several ABX, vancomycin started orally - ID follow up. Abdominal pain and diarrhea improving - add probiotics  4. Diabetes type 2 with hypoglycemia- poor oral inatke, check a1c - hold 70/30 insulin for now, add glucerna between meals - SSI  5. Paroxysmal atrial fibrillation-due to chronic anemia, her Coumadin has been discontinued last admission. Continue metoprolol for rate control- increase dose as HR elevated- worsened from resp distress S/p pacemaker  6. CK D-baseline creatinine seems to be around 1.2-1.4. when necessary Lasix due to chronic vascular congestion  7. History of coronary artery disease-continue Plavix, metoprolol, lisinopril, Crestor, Ranexa.  8. Hypothyroidism-continue Synthroid.  9. Essential hypertension-continue metoprolol.  10. DVT prophylaxis- on lovenox  Physical Therapy consult when able to   All the records are reviewed and case discussed with Care Management/Social Workerr. Management plans discussed with the patient, family and they are in agreement.  CODE STATUS: Full Code  TOTAL TIME TAKING CARE OF THIS PATIENT: 39 minutes.   POSSIBLE D/C IN 2 DAYS, DEPENDING ON CLINICAL  CONDITION.   Gladstone Lighter M.D on 08/30/2016 at 8:06 AM  Between 7am to 6pm - Pager - [phone]  After 6pm go to [email or web address] - password [facility]  Office  [phone]  CC: Primary care physician; Donnie Coffin, MD

## 2016-08-30 NOTE — Progress Notes (Signed)
Anticoagulation monitoring(Lovenox):  82yo  F ordered Lovenox 40 mg Q24h  Filed Weights   08/28/16 0521  Weight: 232 lb (105.2 kg)   BMI 47   Lab Results  Component Value Date   CREATININE 1.05 (H) 08/30/2016   CREATININE 1.25 (H) 08/29/2016   CREATININE 1.08 (H) 08/28/2016   Estimated Creatinine Clearance: 45.9 mL/min (by C-G formula based on SCr of 1.05 mg/dL (H)). Hemoglobin & Hematocrit     Component Value Date/Time   HGB 9.9 (L) 08/30/2016 0442   HGB 10.1 (L) 07/05/2012 0447   HCT 29.7 (L) 08/30/2016 0442   HCT 30.1 (L) 07/05/2012 0447     Per Protocol for Patient with estCrcl > 30 ml/min and BMI > 40, will transition to Lovenox 40 mg Q12h.      Chinita Greenland PharmD Clinical Pharmacist 08/30/2016

## 2016-08-30 NOTE — Care Management (Signed)
Patient is already followed by home health through [facility].

## 2016-08-31 LAB — CBC
HEMATOCRIT: 28.8 % — AB (ref 35.0–47.0)
HEMOGLOBIN: 9.6 g/dL — AB (ref 12.0–16.0)
MCH: 29.7 pg (ref 26.0–34.0)
MCHC: 33.3 g/dL (ref 32.0–36.0)
MCV: 89.2 fL (ref 80.0–100.0)
Platelets: 268 10*3/uL (ref 150–440)
RBC: 3.23 MIL/uL — AB (ref 3.80–5.20)
RDW: 14.7 % — ABNORMAL HIGH (ref 11.5–14.5)
WBC: 4.5 10*3/uL (ref 3.6–11.0)

## 2016-08-31 LAB — GLUCOSE, CAPILLARY
GLUCOSE-CAPILLARY: 188 mg/dL — AB (ref 65–99)
GLUCOSE-CAPILLARY: 217 mg/dL — AB (ref 65–99)
GLUCOSE-CAPILLARY: 257 mg/dL — AB (ref 65–99)
GLUCOSE-CAPILLARY: 199 mg/dL — AB (ref 65–99)

## 2016-08-31 LAB — BASIC METABOLIC PANEL
Anion gap: 5 (ref 5–15)
BUN: 18 mg/dL (ref 6–20)
CHLORIDE: 106 mmol/L (ref 101–111)
CO2: 25 mmol/L (ref 22–32)
CREATININE: 1.19 mg/dL — AB (ref 0.44–1.00)
Calcium: 7.9 mg/dL — ABNORMAL LOW (ref 8.9–10.3)
GFR calc non Af Amer: 42 mL/min — ABNORMAL LOW (ref >60)
GFR, EST AFRICAN AMERICAN: 49 mL/min — AB (ref >60)
Glucose, Bld: 183 mg/dL — ABNORMAL HIGH (ref 65–99)
POTASSIUM: 4.8 mmol/L (ref 3.5–5.1)
SODIUM: 136 mmol/L (ref 135–145)

## 2016-08-31 NOTE — Progress Notes (Signed)
Physical Therapy Treatment Patient Details Name: Regina [name] MRN: [id number] DOB: [birth year] Today's Date: 08/31/2016    History of Present Illness Regina [name]  is a 83 y.o. female with a known history of Breast cancer status post left-sided mastectomy, coronary artery disease status post stents, diabetes, hypertension, hyperlipidemia, GERD, hypothyroidism, paroxysmal atrial fibrillation who presents to the hospital due to malaise, body aches and just not feeling well. Patient says that she has not been feeling well now for the past few days to a week. She admits to a cough which is productive of yellow sputum, no chills, nausea, vomiting, abdominal pain, diarrhea. She was having significant weakness and malaise today and therefore came to the ER for further evaluation. In the emergency room patient was noted to be positive for influenza B and also noted to have a urinary tract infection. Patient denies any dysuria, hematuria, frequency or any other associated symptoms presently. Hospitalist services were contacted further treatment and evaluation.    PT Comments    Pt in bed, reports needing to be cleaned and CNA obtaining supplies.  While waiting, pt participated in supine exercises as described below.  Offered pt to use bedside commode and she declined.  Rolling left and right with rail and verbal cues for hand placements for care.Offered to assist pt to commode and she again  declined.  She was then able to get to edge of bed with min a x 1 and use of rail.  Sitting with supervision.  Upon standing with min a x 1, pt was again incontinent.  Offered bedside commode and she declined stating she was too messy to try.  She became agitated stating "If I could just get up to the commode it would be better." Again offered and she declined.  She became somewhat agitated stating that nobody would get her up to commode.  Told pt that we were here to help and have offered to assist her multiple times. Shee  stated she was too fatigued at this point.  Assisted back to bed with min a x 2.  She was cleaned as appropriate in supine.  She declined ambulation attempts.  Pt did not seem confused, just generally frustrated with illness and continued loose stools.  Pt with overall poor tolerance in therapy this session.  She required increased assist for bed mobility. Pt voiced concerns over her ability to care for herself upon discharge due to general fatigue.  Son lives with her but she stated he is unhelpful and does not cook or clean and she was having difficulty since a recent discharge from [facility] for rehab.  Pt may benefit from SNF ar discharge to return to her baseline after this illness.  Stated she did not want to return to [facility] but would be open to another facility if she qualifies/is available.     Follow Up Recommendations  SNF     Equipment Recommendations  None recommended by PT    Recommendations for Other Services       Precautions / Restrictions Precautions Precautions: Fall Precaution Comments: Enteric and droplet precautions Restrictions Weight Bearing Restrictions: No    Mobility  Bed Mobility Overal bed mobility: Needs Assistance Bed Mobility: Supine to Sit;Sit to Supine     Supine to sit: Min assist Sit to supine: +2 for physical assistance;Mod assist   General bed mobility comments: Required assist for bed mobility today.  Reports she sleeps in lift chair at home as it is too hard for her at  baseline at home  Transfers Overall transfer level: Needs assistance Equipment used: Rolling walker (2 wheeled) Transfers: Sit to/from Stand Sit to Stand: Min assist         General transfer comment: Incontinent of BM/urine upon standing.   Ambulation/Gait         Gait velocity: Unable to ambulate today - incontinent of bowl/bladder upon standing, declined further trials due to coughing       Stairs            Wheelchair Mobility    Modified Rankin  (Stroke Patients Only)       Balance Overall balance assessment: Needs assistance Sitting-balance support: No upper extremity supported Sitting balance-Leahy Scale: Good         Standing balance comment: Unable to properly acess today as she needed to sit back down quickly                    Cognition Arousal/Alertness: Awake/alert Behavior During Therapy: Anxious Overall Cognitive Status: Within Functional Limits for tasks assessed                      Exercises Other Exercises Other Exercises: Supine exercises BLE 2 x 10 for ankle pumps, glut sets, quad sets, heel slides and ab/adduction.  All limited AROM - unable to complete fully Other Exercises: rolling left and right in bed with rail and verbal cues for hand placement - assisted CNA with care x 2 due to incontinance.    General Comments        Pertinent Vitals/Pain Pain Assessment: No/denies pain    Home Living                      Prior Function            PT Goals (current goals can now be found in the care plan section)      Frequency    Min 2X/week      PT Plan Current plan remains appropriate    Co-evaluation             End of Session Equipment Utilized During Treatment: Gait belt;Oxygen Activity Tolerance: Patient limited by fatigue;Other (comment) Patient left: in bed;with bed alarm set;with call bell/phone within reach;with nursing/sitter in room     Time: [id number] PT Time Calculation (min) (ACUTE ONLY): 28 min  Charges:  $Therapeutic Exercise: 8-22 mins $Therapeutic Activity: 8-22 mins                    G Codes:      Chesley Noon 09-11-16, 3:37 PM

## 2016-08-31 NOTE — Progress Notes (Signed)
[name] at [place] NAME: Regina [name]    MR#:  [id number]  DATE OF BIRTH:  1934-04-14  SUBJECTIVE:  CHIEF COMPLAINT:   Chief Complaint  Patient presents with  . Influenza  . Fever   - has coarse cough, feels sick - c.diff positive, abdominal pain improving  REVIEW OF SYSTEMS:  Review of Systems  Constitutional: Positive for malaise/fatigue. Negative for chills and fever.  HENT: Negative for congestion, ear discharge, hearing loss and nosebleeds.   Eyes: Negative for blurred vision and double vision.  Respiratory: Positive for cough and shortness of breath. Negative for wheezing.   Cardiovascular: Negative for chest pain and palpitations.  Gastrointestinal: Positive for abdominal pain and diarrhea. Negative for constipation, nausea and vomiting.  Genitourinary: Positive for dysuria and urgency.  Musculoskeletal: Positive for myalgias.  Neurological: Negative for dizziness, sensory change, speech change, seizures and headaches.  Psychiatric/Behavioral: Negative for depression.    DRUG ALLERGIES:  No Known Allergies  VITALS:  Blood pressure 125/68, pulse (!) 123, temperature 97.8 F (36.6 C), temperature source Oral, resp. rate 18, height 4\' 11"  (1.499 m), weight 105.2 kg (232 lb), SpO2 97 %.  PHYSICAL EXAMINATION:  Physical Exam  GENERAL:  83 y.o.-year-old obese patient lying in the bed. Appears very uncomfortable EYES: Pupils equal, round, reactive to light and accommodation. No scleral icterus. Extraocular muscles intact.  HEENT: Head atraumatic, normocephalic. Oropharynx and nasopharynx clear.  NECK:  Supple, no jugular venous distention. No thyroid enlargement, no tenderness.  LUNGS: coarse breath sounds bilaterally, scattered wheezing, no rales,rhonchi or crepitation. No use of accessory muscles of respiration.  CARDIOVASCULAR: S1, S2 normal. No  rubs, or gallops. 2/6 systolic murmur present. ABDOMEN: Soft, obese, some  tenderness in the upper abdomen, nondistended. Bowel sounds present. No organomegaly or mass.  EXTREMITIES: No cyanosis, or clubbing. 1+ edema NEUROLOGIC: Cranial nerves II through XII are intact. Muscle strength 5/5 in all extremities. Sensation intact. Gait not checked. Global weakness noted. PSYCHIATRIC: The patient is alert and oriented x 3.  SKIN: No obvious rash, lesion, or ulcer.    LABORATORY PANEL:   CBC  Recent Labs Lab 08/31/16 0440  WBC 4.5  HGB 9.6*  HCT 28.8*  PLT 268   ------------------------------------------------------------------------------------------------------------------  Chemistries   Recent Labs Lab 08/28/16 0748  08/31/16 0440  NA 134*  < > 136  K 4.9  < > 4.8  CL 101  < > 106  CO2 24  < > 25  GLUCOSE 187*  < > 183*  BUN 21*  < > 18  CREATININE 1.08*  < > 1.19*  CALCIUM 8.4*  < > 7.9*  AST 26  --   --   ALT 11*  --   --   ALKPHOS 51  --   --   BILITOT 0.5  --   --   < > = values in this interval not displayed. ------------------------------------------------------------------------------------------------------------------  Cardiac Enzymes No results for input(s): TROPONINI in the last 168 hours. ------------------------------------------------------------------------------------------------------------------  RADIOLOGY:  No results found.  EKG:   Orders placed or performed during the hospital encounter of 08/28/16  . EKG 12-Lead  . EKG 12-Lead    ASSESSMENT AND PLAN:    83 year old female with past medical history of breast cancer, hypertension, hyperlipidemia, hypothyroidism, paroxysmal atrial fibrillation, diabetes, coronary artery disease presents to the hospital due to chills and malaise cough and wheezing.  1. Sepsis-secondary to flu and noted to have a urinary tract infection. -fluids  stopped, on Tamiflu and Rocephin. -BC +CNS -Infectious disease consulted  2. Influenza illness-significant cough is present. Cough  medications added. Significant bronchitis present, Continue Tamiflu for influenza illness. On rocephin -On 2 L oxygen here. Not on home oxygen  3. C.diff colitis- on several ABX, vancomycin started orally - ID follow up. Abdominal pain and diarrhea improving - add probiotics  4. Diabetes type 2 with hypoglycemia- poor oral inatke, check a1c - hold 70/30 insulin for now, add glucerna between meals - SSI  5. Paroxysmal atrial fibrillation-due to chronic anemia, her Coumadin has been discontinued last admission. Continue metoprolol for rate control- increase dose as HR elevated- worsened from resp distress S/p pacemaker  6. CK D-baseline creatinine seems to be around 1.2-1.4. when necessary Lasix due to chronic vascular congestion  7. History of coronary artery disease-continue Plavix, metoprolol, lisinopril, Crestor, Ranexa.  8. Hypothyroidism-continue Synthroid.  9. Essential hypertension-continue metoprolol.  10. DVT prophylaxis- on lovenox  Physical Therapy consult recommends home health  All the records are reviewed and case discussed with Care Management/Social Workerr. Management plans discussed with the patient, family and they are in agreement.  CODE STATUS: Full Code  TOTAL TIME TAKING CARE OF THIS PATIENT: 30 minutes.   POSSIBLE D/C IN 2 DAYS, DEPENDING ON CLINICAL CONDITION.   Tuck Dulworth M.D on 08/31/2016 at 2:22 PM  Between 7am to 6pm - Pager - [phone] After 6pm go to [email or web address] - password [facility]  Office  [phone]  CC: Primary care physician; Donnie Coffin, MD

## 2016-09-01 LAB — GLUCOSE, CAPILLARY
GLUCOSE-CAPILLARY: 255 mg/dL — AB (ref 65–99)
GLUCOSE-CAPILLARY: 203 mg/dL — AB (ref 65–99)
GLUCOSE-CAPILLARY: 192 mg/dL — AB (ref 65–99)
Glucose-Capillary: 270 mg/dL — ABNORMAL HIGH (ref 65–99)

## 2016-09-01 LAB — CULTURE, BLOOD (ROUTINE X 2)

## 2016-09-01 MED ORDER — METOPROLOL TARTRATE 25 MG PO TABS
25.0000 mg | ORAL_TABLET | Freq: Three times a day (TID) | ORAL | Status: DC
  Administered 2016-09-01 – 2016-09-02 (×3): 25 mg via ORAL
  Filled 2016-09-01 (×3): qty 1

## 2016-09-01 NOTE — Clinical Social Work Note (Signed)
CSW received consult for SNF placement. Patient is declining SNF regardless of family wishes. CSW will con't to follow if this changes.  Santiago Bumpers, MSW, Latanya Presser [phone]

## 2016-09-01 NOTE — Progress Notes (Signed)
[facility] NAME: Regina [name]    MR#:  [id number]  DATE OF BIRTH:  November 01, 1933  SUBJECTIVE:  CHIEF COMPLAINT:   Chief Complaint  Patient presents with  . Influenza  . Fever   - has coarse cough - c.diff positive, abdominal pain improving  REVIEW OF SYSTEMS:  Review of Systems  Constitutional: Positive for malaise/fatigue. Negative for chills and fever.  HENT: Negative for congestion, ear discharge, hearing loss and nosebleeds.   Eyes: Negative for blurred vision and double vision.  Respiratory: Positive for cough and shortness of breath. Negative for wheezing.   Cardiovascular: Negative for chest pain and palpitations.  Gastrointestinal: Negative for constipation, nausea and vomiting.  Musculoskeletal: Positive for myalgias.  Neurological: Negative for dizziness, sensory change, speech change, seizures and headaches.  Psychiatric/Behavioral: Negative for depression.    DRUG ALLERGIES:  No Known Allergies  VITALS:  Blood pressure (!) 126/55, pulse (!) 133, temperature 98.3 F (36.8 C), temperature source Oral, resp. rate 20, height 4\' 11"  (1.499 m), weight 105.2 kg (232 lb), SpO2 95 %.  PHYSICAL EXAMINATION:  Physical Exam  GENERAL:  83 y.o.-year-old obese patient lying in the bed. Appears very uncomfortable EYES: Pupils equal, round, reactive to light and accommodation. No scleral icterus. Extraocular muscles intact.  HEENT: Head atraumatic, normocephalic. Oropharynx and nasopharynx clear.  NECK:  Supple, no jugular venous distention. No thyroid enlargement, no tenderness.  LUNGS: coarse breath sounds bilaterally, scattered wheezing, no rales,rhonchi or crepitation. No use of accessory muscles of respiration.  CARDIOVASCULAR: S1, S2 normal. No  rubs, or gallops. 2/6 systolic murmur present. ABDOMEN: Soft, obese, some tenderness in the upper abdomen, nondistended. Bowel sounds present. No organomegaly or mass.    EXTREMITIES: No cyanosis, or clubbing. 1+ edema NEUROLOGIC: Cranial nerves II through XII are intact. Muscle strength 5/5 in all extremities. Sensation intact. Gait not checked. Global weakness noted. PSYCHIATRIC: The patient is alert and oriented x 3.  SKIN: No obvious rash, lesion, or ulcer.    LABORATORY PANEL:   CBC  Recent Labs Lab 08/31/16 0440  WBC 4.5  HGB 9.6*  HCT 28.8*  PLT 268   ------------------------------------------------------------------------------------------------------------------  Chemistries   Recent Labs Lab 08/28/16 0748  08/31/16 0440  NA 134*  < > 136  K 4.9  < > 4.8  CL 101  < > 106  CO2 24  < > 25  GLUCOSE 187*  < > 183*  BUN 21*  < > 18  CREATININE 1.08*  < > 1.19*  CALCIUM 8.4*  < > 7.9*  AST 26  --   --   ALT 11*  --   --   ALKPHOS 51  --   --   BILITOT 0.5  --   --   < > = values in this interval not displayed. ------------------------------------------------------------------------------------------------------------------  Cardiac Enzymes No results for input(s): TROPONINI in the last 168 hours. ------------------------------------------------------------------------------------------------------------------  RADIOLOGY:  No results found.  EKG:   Orders placed or performed during the hospital encounter of 08/28/16  . EKG 12-Lead  . EKG 12-Lead    ASSESSMENT AND PLAN:    83 year old female with past medical history of breast cancer, hypertension, hyperlipidemia, hypothyroidism, paroxysmal atrial fibrillation, diabetes, coronary artery disease presents to the hospital due to chills and malaise cough and wheezing.  1. Sepsis-secondary to flu and noted to have a urinary tract infection. -fluids stopped, on Tamiflu and Rocephin.(last dose on Monday) -BC +CNS -Infectious disease consulted  2. Influenza illness-significant cough is present. Cough medications added. Significant bronchitis present, Continue Tamiflu for  influenza illness. On rocephin -On 2 L oxygen here. Not on home oxygen  3. C.diff colitis- on several ABX, vancomycin started orally - ID follow up. Abdominal pain and diarrhea improving - add probiotics  4. Diabetes type 2 with hypoglycemia- poor oral inatke, check a1c - hold 70/30 insulin for now, add glucerna between meals - SSI  5. Paroxysmal atrial fibrillation-due to chronic anemia, her Coumadin has been discontinued last admission. Continue metoprolol for rate control- increase dose as HR elevated- worsened from resp distress S/p pacemaker  6. CK D-baseline creatinine seems to be around 1.2-1.4. when necessary Lasix due to chronic vascular congestion  7. History of coronary artery disease-continue Plavix, metoprolol, lisinopril, Crestor, Ranexa.  8. Hypothyroidism-continue Synthroid.  9. Essential hypertension-continue metoprolol.  10. DVT prophylaxis- on lovenox  Physical Therapy consult recommends rehabilitation due to her deconditioning. Spoke with patient about rehabilitation she was quite upset with me about rehabilitation and declines going over there. She wants to [facility]  Patient was told she will be ready for discharge tomorrow if everything remains stable. Try to attempt to call patient's daughter Regina [name] left a message on her cell phone.  All the records are reviewed and case discussed with Care Management/Social Workerr. Management plans discussed with the patient, family and they are in agreement.  CODE STATUS: Full Code  TOTAL TIME TAKING CARE OF THIS PATIENT: 30 minutes.   POSSIBLE D/C IN 1 DAYS, DEPENDING ON CLINICAL CONDITION.   Regina [name] M.D on 09/01/2016 at 12:55 PM  Between 7am to 6pm - Pager - [phone] After 6pm go to [email or web address] - password [facility]  Office  [phone]  CC: Primary care physician; Donnie Coffin, MD

## 2016-09-01 NOTE — Care Management Note (Signed)
Case Management Note  Patient Details  Name: Regina [name] MRN: [id number] Date of Birth: 13-Mar-1934  Subjective/Objective:       Heads up call to Jana Half at [facility] that MD will order additional home health services prior to discharge home on Monday.             Action/Plan:   Expected Discharge Date:                  Expected Discharge Plan:     In-House Referral:     Discharge planning Services     Post Acute Care Choice:    Choice offered to:     DME Arranged:    DME Agency:     HH Arranged:    HH Agency:     Status of Service:     If discussed at [employer] of Stay Meetings, dates discussed:    Additional Comments:  Jacarie Pate A, RN 09/01/2016, 1:13 PM

## 2016-09-02 LAB — CULTURE, BLOOD (ROUTINE X 2): CULTURE: NO GROWTH

## 2016-09-02 LAB — GLUCOSE, CAPILLARY
GLUCOSE-CAPILLARY: 276 mg/dL — AB (ref 65–99)
GLUCOSE-CAPILLARY: 273 mg/dL — AB (ref 65–99)

## 2016-09-02 MED ORDER — GLUCERNA SHAKE PO LIQD: 237.0000 mL | Freq: Three times a day (TID) | ORAL | 0 refills | Status: DC

## 2016-09-02 MED ORDER — VANCOMYCIN 50 MG/ML ORAL SOLUTION: 125.0000 mg | Freq: Four times a day (QID) | ORAL | 0 refills | Status: AC

## 2016-09-02 MED ORDER — GUAIFENESIN ER 600 MG PO TB12: 600.0000 mg | ORAL_TABLET | Freq: Two times a day (BID) | ORAL | 0 refills | Status: DC | PRN

## 2016-09-02 MED ORDER — IPRATROPIUM-ALBUTEROL 0.5-2.5 (3) MG/3ML IN SOLN: 3.0000 mL | Freq: Four times a day (QID) | RESPIRATORY_TRACT | 0 refills | Status: DC | PRN

## 2016-09-02 NOTE — Progress Notes (Signed)
Patient discharged to [facility] via EMS. Shellyann Wandrey S, RN  

## 2016-09-02 NOTE — Progress Notes (Signed)
Inpatient Diabetes Program Recommendations  AACE/ADA: New Consensus Statement on Inpatient Glycemic Control (2015)  Target Ranges:  Prepandial:   less than 140 mg/dL      Peak postprandial:   less than 180 mg/dL (1-2 hours)      Critically ill patients:  140 - 180 mg/dL   Results for Regina [name], Regina [name] (MRN [id number]) as of 09/02/2016 08:20  Ref. Range 09/01/2016 08:26 09/01/2016 11:53 09/01/2016 17:00 09/01/2016 20:38  Glucose-Capillary Latest Ref Range: 65 - 99 mg/dL 270 (H) 255 (H) 203 (H) 192 (H)   Results for Regina [name], Regina [name] (MRN [id number]) as of 09/02/2016 08:20  Ref. Range 09/02/2016 07:47  Glucose-Capillary Latest Ref Range: 65 - 99 mg/dL 276 (H)    Home DM Meds: 70/30 Insulin- 55 units daily in the AM  Current Insulin Orders: Novolog Sensitive Correction Scale/ SSI (0-9 units) TID AC + HS      MD- If patient is not discharged home today, please consider the following:  Start 70/30 Insulin- 15 units BID with meals  This dose would provide patient with approximately 10 units longer-acting insulin with breakfast and dinner & 4.5 units Novolog with breakfast and dinner     --Will follow patient during hospitalization--  Wyn Quaker RN, MSN, CDE Diabetes Coordinator Inpatient Glycemic Control Team Team Pager: [phone] (8a-5p)

## 2016-09-02 NOTE — Clinical Social Work Placement (Signed)
   CLINICAL SOCIAL WORK PLACEMENT  NOTE  Date:  09/02/2016  Patient Details  Name: Regina [name] MRN: [id number] Date of Birth: 30-Oct-1933  Clinical Social Work is seeking post-discharge placement for this patient at the [place] level of care (*CSW will initial, date and re-position this form in  chart as items are completed):  Yes   Patient/family provided with [place] Work Department's list of facilities offering this level of care within the geographic area requested by the patient (or if unable, by the patient's family).  Yes   Patient/family informed of their freedom to choose among providers that offer the needed level of care, that participate in Medicare, Medicaid or managed care program needed by the patient, have an available bed and are willing to accept the patient.  Yes   Patient/family informed of [facility]'s ownership interest in [facility] and [facility], as well as of the fact that they are under no obligation to receive care at these facilities.  PASRR submitted to EDS on       PASRR number received on       Existing PASRR number confirmed on 09/02/16     FL2 transmitted to all facilities in geographic area requested by pt/family on 09/02/16     FL2 transmitted to all facilities within larger geographic area on       Patient informed that his/her managed care company has contracts with or will negotiate with certain facilities, including the following:        Yes   Patient/family informed of bed offers received.  Patient chooses bed at  [facility] )     Physician recommends and patient chooses bed at      Patient to be transferred to  [facility] ) on 09/02/16.  Patient to be transferred to facility by  [facility] )     Patient family notified on 09/02/16 of transfer.  Name of family member notified:   (Patient's daughter Regina [name] is aware of above.  )     PHYSICIAN       Additional  Comment:    _______________________________________________ Creed Kail, Veronia Beets, LCSW 09/02/2016, 12:27 PM

## 2016-09-02 NOTE — Progress Notes (Signed)
EMS called for transport to [facility]. Cornell Bourbon S, RN  

## 2016-09-02 NOTE — Progress Notes (Addendum)
Report called to Levada Dy at [facility]. Madlyn Frankel, RN

## 2016-09-02 NOTE — Clinical Social Work Note (Signed)
Clinical Social Work Assessment  Patient Details  Name: Regina [name] MRN: [id number] Date of Birth: May 12, 1934  Date of referral:  09/02/16               Reason for consult:  Facility Placement                Permission sought to share information with:  Chartered certified accountant granted to share information::  Yes, Verbal Permission Granted  Name::      [place]::   [place]   Relationship::     Contact Information:     Housing/Transportation Living arrangements for the past 2 months:  [place] of Information:  Patient, Adult Children Patient Interpreter Needed:  None Criminal Activity/Legal Involvement Pertinent to Current Situation/Hospitalization:  No - Comment as needed Significant Relationships:  Adult Children Lives with:  Self Do you feel safe going back to the place where you live?  Yes Need for family participation in patient care:  No (Coment)  Care giving concerns:  Patient lives alone in [place].    Social Worker assessment / plan:  Holiday representative (CSW) received verbal consult from MD that patient is ready for D/C today and wants to go to rehab at a SNF. CSW met with patient to discuss D/C plan. Patient was alert and oriented and was sitting up in the bed. Patient reported that she lives alone in [place] and reported that she wants to go to SNF. PT is recommending SNF. CSW explained that her SNF options will be limited because she has the flu. FL2 complete and faxed out. CSW presented bed offers and patient chose [facility]. Patient reported that she is not doing chemo or radiation treatment.   Patient is medically stable for D/C to [facility] today. Per [facility] admissions coordinator at [facility] patient will go to room 510. RN will call report and arrange EMS for transport. CSW sent D/C orders to [facility] via [place]. Patient is aware of above. CSW contacted patient's daughter [name] who is  in agreement with plan. Please reconsult if future social work needs arise. CSW signing off.    Employment status:  Retired Forensic scientist:  Medicaid In [place], [employer] PT Recommendations:  [place] / Referral to community resources:  [place]  Patient/Family's Response to care:  Patient and daughter are agreeable for patient to go to [facility] today.   Patient/Family's Understanding of and Emotional Response to Diagnosis, Current Treatment, and Prognosis:  Patient and daughter were very pleasant and thanked CSW for assistance.   Emotional Assessment Appearance:  Appears stated age Attitude/Demeanor/Rapport:    Affect (typically observed):  Accepting, Adaptable, Pleasant Orientation:  Oriented to Self, Oriented to Place, Oriented to  Time, Oriented to Situation Alcohol / Substance use:  Not Applicable Psych involvement (Current and /or in the community):  No (Comment)  Discharge Needs  Concerns to be addressed:  Discharge Planning Concerns Readmission within the last 30 days:  No Current discharge risk:  None Barriers to Discharge:  No Barriers Identified   Danica Camarena, Veronia Beets, LCSW 09/02/2016, 12:28 PM

## 2016-09-02 NOTE — Discharge Summary (Signed)
[facility] NAME: Regina [name]    MR#:  [id number]  DATE OF BIRTH:  02-Sep-1933  DATE OF ADMISSION:  08/28/2016 ADMITTING PHYSICIAN: Henreitta Leber, MD  DATE OF DISCHARGE: 09/02/2016  PRIMARY CARE PHYSICIAN: Donnie Coffin, MD    ADMISSION DIAGNOSIS:  Influenza [J11.1] Urinary tract infection without hematuria, site unspecified [N39.0]  DISCHARGE DIAGNOSIS:  Sepsis due to FLU and UTI C difficile colitis  SECONDARY DIAGNOSIS:   Past Medical History:  Diagnosis Date  . Breast cancer, left [facility]) 2013   Mastectomy.   . CAD (coronary artery disease)    s/p cath in 2009 with 2 stents  . Diabetes mellitus without complication ([place])   . GERD (gastroesophageal reflux disease)   . HLD (hyperlipidemia)   . Hypertension   . Hypothyroidism   . Paroxysmal atrial fibrillation [facility])     HOSPITAL COURSE:   83 year old female with past medical history of breast cancer, hypertension, hyperlipidemia, hypothyroidism, paroxysmal atrial fibrillation, diabetes, coronary artery disease presents to the hospital due to chills and malaise cough and wheezing.  1. Sepsis-secondary to flu and noted to have a urinary tract infection. -fluids stopped, on Tamiflu and Rocephin.(last dose on Monday 09/02/16) -BC +CNS -Infectious disease consult appreciated  2. Influenza illness-significant cough is present. Cough medications added. Significant bronchitis present, Continue Tamiflu for influenza illness.  -On 2 L oxygen here. Not on home oxygen  3. C.diff colitis- on several ABX, vancomycin started orally (for 10 more days) - ID follow up. Abdominal pain and diarrhea improving - add probiotics  4. Diabetes type 2 with hypoglycemia- poor oral inatke, check a1c - hold 70/30 insulin for now, add glucerna between meals - SSI  5. Paroxysmal atrial fibrillation-due to chronic anemia, her Coumadin has been discontinued last admission. Continue  metoprolol for rate control- increase dose as HR elevated- worsened from resp distress S/p pacemaker  6. CK D-baseline creatinine seems to be around 1.2-1.4. when necessary Lasix due to chronic vascular congestion  7. History of coronary artery disease-continue Plavix, metoprolol, lisinopril, Crestor, Ranexa.  8. Hypothyroidism-continue Synthroid.  9. Essential hypertension-continue metoprolol.  10. DVT prophylaxis- on lovenox  Physical Therapy consult recommends rehabilitation due to her deconditioning. Spoke with patient about rehabilitation she was quite upset with me about rehabilitation and declines going over there.  Pt now today says she will go to rehab. CSW informed D/c to rehab today when bed avilable CONSULTS OBTAINED:  Treatment Team:  Leonel Ramsay, MD  DRUG ALLERGIES:  No Known Allergies  DISCHARGE MEDICATIONS:   Current Discharge Medication List    START taking these medications   Details  feeding supplement, GLUCERNA SHAKE, (GLUCERNA SHAKE) LIQD Take 237 mLs by mouth 3 (three) times daily between meals. Qty: 30 Can, Refills: 0    guaiFENesin (MUCINEX) 600 MG 12 hr tablet Take 1 tablet (600 mg total) by mouth 2 (two) times daily as needed. Qty: 30 tablet, Refills: 0    ipratropium-albuterol (DUONEB) 0.5-2.5 (3) MG/3ML SOLN Take 3 mLs by nebulization every 6 (six) hours as needed. Qty: 360 mL, Refills: 0    vancomycin (VANCOCIN) 50 mg/mL oral solution Take 2.5 mLs (125 mg total) by mouth every 6 (six) hours. Qty: 30 mL, Refills: 0      CONTINUE these medications which have NOT CHANGED   Details  acetaminophen (TYLENOL) 325 MG tablet Take 2 tablets (650 mg total) by mouth every 6 (six) hours as needed for mild  pain (or Fever >/= 101).    bumetanide (BUMEX) 2 MG tablet Take 2 mg by mouth daily.    clopidogrel (PLAVIX) 75 MG tablet Take 75 mg by mouth daily.    esomeprazole (NEXIUM) 40 MG capsule Take 40 mg by mouth daily.     insulin aspart  protamine- aspart (NOVOLOG MIX 70/30) (70-30) 100 UNIT/ML injection Inject 0.28 mLs (28 Units total) into the skin 2 (two) times daily with a meal. Qty: 10 mL, Refills: 11    ipratropium (ATROVENT) 0.03 % nasal spray Place 2 sprays into both nostrils 3 (three) times daily.    levothyroxine (SYNTHROID, LEVOTHROID) 50 MCG tablet Take 50 mcg by mouth daily.    lisinopril (PRINIVIL,ZESTRIL) 20 MG tablet Take 20 mg by mouth daily.    metoprolol tartrate (LOPRESSOR) 25 MG tablet Take 1 tablet (25 mg total) by mouth 2 (two) times daily.    oxyCODONE-acetaminophen (PERCOCET/ROXICET) 5-325 MG tablet Take 1 tablet by mouth 2 (two) times daily as needed for severe pain.    paricalcitol (ZEMPLAR) 1 MCG capsule Take 1 mcg by mouth 3 (three) times a week. Pt takes on Monday, Wednesday, and Friday.    polyethylene glycol (MIRALAX / GLYCOLAX) packet Take 17 g by mouth daily as needed for mild constipation. Qty: 14 each, Refills: 0    ranolazine (RANEXA) 500 MG 12 hr tablet Take 500 mg by mouth 2 (two) times daily.    rosuvastatin (CRESTOR) 10 MG tablet Take 10 mg by mouth daily.    ferrous sulfate 325 (65 FE) MG tablet Take 1 tablet (325 mg total) by mouth 2 (two) times daily with a meal. Refills: 3    furosemide (LASIX) 20 MG tablet Take 1 tablet (20 mg total) by mouth daily. Qty: 30 tablet      STOP taking these medications     HYDROcodone-acetaminophen (NORCO/VICODIN) 5-325 MG tablet         If you experience worsening of your admission symptoms, develop shortness of breath, life threatening emergency, suicidal or homicidal thoughts you must seek medical attention immediately by calling 911 or calling your MD immediately  if symptoms less severe.  You Must read complete instructions/literature along with all the possible adverse reactions/side effects for all the Medicines you take and that have been prescribed to you. Take any new Medicines after you have completely understood and accept  all the possible adverse reactions/side effects.   Please note  You were cared for by a hospitalist during your hospital stay. If you have any questions about your discharge medications or the care you received while you were in the hospital after you are discharged, you can call the unit and asked to speak with the hospitalist on call if the hospitalist that took care of you is not available. Once you are discharged, your primary care physician will handle any further medical issues. Please note that NO REFILLS for any discharge medications will be authorized once you are discharged, as it is imperative that you return to your primary care physician (or establish a relationship with a primary care physician if you do not have one) for your aftercare needs so that they can reassess your need for medications and monitor your lab values. Today   SUBJECTIVE   I did not sleep well last nite  VITAL SIGNS:  Blood pressure 131/66, pulse (!) 124, temperature 98.7 F (37.1 C), resp. rate 20, height 4\' 11"  (1.499 m), weight 105.2 kg (232 lb), SpO2 96 %.  I/O:  Intake/Output Summary (Last 24 hours) at 09/02/16 1056 Last data filed at 09/01/16 1300  Gross per 24 hour  Intake              240 ml  Output                0 ml  Net              240 ml    PHYSICAL EXAMINATION:  GENERAL:  83 y.o.-year-old patient lying in the bed with no acute distress. Obese, chornically ill EYES: Pupils equal, round, reactive to light and accommodation. No scleral icterus. Extraocular muscles intact.  HEENT: Head atraumatic, normocephalic. Oropharynx and nasopharynx clear.  NECK:  Supple, no jugular venous distention. No thyroid enlargement, no tenderness.  LUNGS:coarsebreath sounds bilaterally, scattered wheezing,no rales,rhonchi or crepitation. No use of accessory muscles of respiration.  CARDIOVASCULAR: S1, S2 normal. No murmurs, rubs, or gallops.  ABDOMEN: Soft, non-tender, non-distended. Bowel sounds present. No  organomegaly or mass.  EXTREMITIES: No pedal edema, cyanosis, or clubbing.  NEUROLOGIC: Cranial nerves II through XII are intact. Muscle strength 5/5 in all extremities. Sensation intact. Gait not checked.  PSYCHIATRIC: The patient is alert and oriented x 3.  SKIN: No obvious rash, lesion, or ulcer.   DATA REVIEW:   CBC   Recent Labs Lab 08/31/16 0440  WBC 4.5  HGB 9.6*  HCT 28.8*  PLT 268    Chemistries   Recent Labs Lab 08/28/16 0748  08/31/16 0440  NA 134*  < > 136  K 4.9  < > 4.8  CL 101  < > 106  CO2 24  < > 25  GLUCOSE 187*  < > 183*  BUN 21*  < > 18  CREATININE 1.08*  < > 1.19*  CALCIUM 8.4*  < > 7.9*  AST 26  --   --   ALT 11*  --   --   ALKPHOS 51  --   --   BILITOT 0.5  --   --   < > = values in this interval not displayed.  Microbiology Results   Recent Results (from the past 240 hour(s))  Urine culture     Status: Abnormal   Collection Time: 08/28/16  7:19 AM  Result Value Ref Range Status   Specimen Description URINE, RANDOM  Final   Special Requests NONE  Final   Culture >=100,000 COLONIES/mL KLEBSIELLA OXYTOCA (A)  Final   Report Status 08/30/2016 FINAL  Final   Organism ID, Bacteria KLEBSIELLA OXYTOCA (A)  Final      Susceptibility   Klebsiella oxytoca - MIC*    AMPICILLIN 8 RESISTANT Resistant     CEFAZOLIN <=4 SENSITIVE Sensitive     CEFTRIAXONE <=1 SENSITIVE Sensitive     CIPROFLOXACIN <=0.25 SENSITIVE Sensitive     GENTAMICIN <=1 SENSITIVE Sensitive     IMIPENEM <=0.25 SENSITIVE Sensitive     NITROFURANTOIN 32 SENSITIVE Sensitive     TRIMETH/SULFA <=20 SENSITIVE Sensitive     AMPICILLIN/SULBACTAM 4 SENSITIVE Sensitive     PIP/TAZO <=4 SENSITIVE Sensitive     Extended ESBL NEGATIVE Sensitive     * >=100,000 COLONIES/mL KLEBSIELLA OXYTOCA  Culture, blood (routine x 2)     Status: Abnormal   Collection Time: 08/28/16  9:45 AM  Result Value Ref Range Status   Specimen Description BLOOD R AC  Final   Special Requests   Final     BOTTLES DRAWN AEROBIC AND ANAEROBIC AER 6ML ANA 5ML  Culture  Setup Time   Final    GRAM POSITIVE COCCI ANAEROBIC BOTTLE ONLY CRITICAL RESULT CALLED TO, READ BACK BY AND VERIFIED WITH: KAREN HAYES AT [id number] 08/29/16 SDR    Culture (A)  Final    STAPHYLOCOCCUS SPECIES (COAGULASE NEGATIVE) THE SIGNIFICANCE OF ISOLATING THIS ORGANISM FROM A SINGLE SET OF BLOOD CULTURES WHEN MULTIPLE SETS ARE DRAWN IS UNCERTAIN. PLEASE NOTIFY THE MICROBIOLOGY DEPARTMENT WITHIN ONE WEEK IF SPECIATION AND SENSITIVITIES ARE REQUIRED. Performed at [facility], [place] [address], [place], [place] [address]    Report Status 09/01/2016 FINAL  Final  Blood Culture ID Panel (Reflexed)     Status: Abnormal   Collection Time: 08/28/16  9:45 AM  Result Value Ref Range Status   Enterococcus species NOT DETECTED NOT DETECTED Final   Listeria monocytogenes NOT DETECTED NOT DETECTED Final   Staphylococcus species DETECTED (A) NOT DETECTED Final    Comment: CRITICAL RESULT CALLED TO, READ BACK BY AND VERIFIED WITH:  KAREN HAYES AT 1341 08/29/16 SDR    Staphylococcus aureus NOT DETECTED NOT DETECTED Final   Methicillin resistance NOT DETECTED NOT DETECTED Final   Streptococcus species NOT DETECTED NOT DETECTED Final   Streptococcus agalactiae NOT DETECTED NOT DETECTED Final   Streptococcus pneumoniae NOT DETECTED NOT DETECTED Final   Streptococcus pyogenes NOT DETECTED NOT DETECTED Final   Acinetobacter baumannii NOT DETECTED NOT DETECTED Final   Enterobacteriaceae species NOT DETECTED NOT DETECTED Final   Enterobacter cloacae complex NOT DETECTED NOT DETECTED Final   Escherichia coli NOT DETECTED NOT DETECTED Final   Klebsiella oxytoca NOT DETECTED NOT DETECTED Final   Klebsiella pneumoniae NOT DETECTED NOT DETECTED Final   Proteus species NOT DETECTED NOT DETECTED Final   Serratia marcescens NOT DETECTED NOT DETECTED Final   Haemophilus influenzae NOT DETECTED NOT DETECTED Final   Neisseria meningitidis NOT  DETECTED NOT DETECTED Final   Pseudomonas aeruginosa NOT DETECTED NOT DETECTED Final   Candida albicans NOT DETECTED NOT DETECTED Final   Candida glabrata NOT DETECTED NOT DETECTED Final   Candida krusei NOT DETECTED NOT DETECTED Final   Candida parapsilosis NOT DETECTED NOT DETECTED Final   Candida tropicalis NOT DETECTED NOT DETECTED Final  Culture, blood (routine x 2)     Status: None   Collection Time: 08/28/16 12:00 PM  Result Value Ref Range Status   Specimen Description BLOOD RIGHT AC  Final   Special Requests   Final    BOTTLES DRAWN AEROBIC AND ANAEROBIC AER 1ML ANA 1ML   Culture NO GROWTH 5 DAYS  Final   Report Status 09/02/2016 FINAL  Final  Gastrointestinal Panel by PCR , Stool     Status: None   Collection Time: 08/29/16  3:15 AM  Result Value Ref Range Status   Campylobacter species NOT DETECTED NOT DETECTED Final   Plesimonas shigelloides NOT DETECTED NOT DETECTED Final   Salmonella species NOT DETECTED NOT DETECTED Final   Yersinia enterocolitica NOT DETECTED NOT DETECTED Final   Vibrio species NOT DETECTED NOT DETECTED Final   Vibrio cholerae NOT DETECTED NOT DETECTED Final   Enteroaggregative E coli (EAEC) NOT DETECTED NOT DETECTED Final   Enteropathogenic E coli (EPEC) NOT DETECTED NOT DETECTED Final   Enterotoxigenic E coli (ETEC) NOT DETECTED NOT DETECTED Final   Shiga like toxin producing E coli (STEC) NOT DETECTED NOT DETECTED Final   Shigella/Enteroinvasive E coli (EIEC) NOT DETECTED NOT DETECTED Final   Cryptosporidium NOT DETECTED NOT DETECTED Final   Cyclospora cayetanensis NOT DETECTED  NOT DETECTED Final   Entamoeba histolytica NOT DETECTED NOT DETECTED Final   Giardia lamblia NOT DETECTED NOT DETECTED Final   Adenovirus F40/41 NOT DETECTED NOT DETECTED Final   Astrovirus NOT DETECTED NOT DETECTED Final   Norovirus GI/GII NOT DETECTED NOT DETECTED Final   Rotavirus A NOT DETECTED NOT DETECTED Final   Sapovirus (I, II, IV, and V) NOT DETECTED NOT  DETECTED Final  C difficile quick scan w PCR reflex     Status: Abnormal   Collection Time: 08/29/16  3:15 AM  Result Value Ref Range Status   C Diff antigen POSITIVE (A) NEGATIVE Final   C Diff toxin POSITIVE (A) NEGATIVE Final   C Diff interpretation Toxin producing C. difficile detected.  Final    Comment: CRITICAL RESULT CALLED TO, READ BACK BY AND VERIFIED WITH: SHAY BREWER AT 1619 ON 08/29/2016 JLJ     RADIOLOGY:  No results found.   Management plans discussed with the patient, family and they are in agreement.  CODE STATUS:     Code Status Orders        Start     Ordered   08/28/16 1511  Full code  Continuous     08/28/16 1510    Code Status History    Date Active Date Inactive Code Status Order ID Comments User Context   06/26/2016 10:43 PM 07/01/2016  4:45 PM Full Code [id number]  Lance Coon, MD Inpatient   02/20/2015  3:53 AM 02/21/2015  5:11 PM Full Code [id number]  Lance Coon, MD Inpatient      TOTAL TIME TAKING CARE OF THIS PATIENT: 40  minutes.    Keshona Kartes M.D on 09/02/2016 at 10:56 AM  Between 7am to 6pm - Pager - [phone] After 6pm go to [email or web address] - password [email or web address] [facility]  Office  [phone]  CC: Primary care physician; Donnie Coffin, MD

## 2016-09-02 NOTE — Care Management Important Message (Signed)
Important Message  Patient Details  Name: Regina [name] MRN: [id number] Date of Birth: 09/08/1933   Medicare Important Message Given:  Yes    Shelbie Ammons, RN 09/02/2016, 8:52 AM

## 2016-09-02 NOTE — NC FL2 (Signed)
[place] LEVEL OF CARE SCREENING TOOL     IDENTIFICATION  Patient Name: Regina [name] Birthdate: 07-15-1934 Sex: female Admission Date (Current Location): 08/28/2016  [facility] and [place] Number:  Regina [name]  ([id number]) Facility and Address:  [facility], [address], [place], [place] [address]      Provider Number: [id number]  Attending Physician Name and Address:  Fritzi Mandes, MD  Relative Name and Phone Number:       Current Level of Care: Hospital Recommended Level of Care: [place] Prior Approval Number:    Date Approved/Denied:   PASRR Number:  ([id number] )  Discharge Plan: SNF    Current Diagnoses: Patient Active Problem List   Diagnosis Date Noted  . Sepsis ([place]) 08/28/2016  . Cellulitis 06/26/2016  . UTI (lower urinary tract infection) 02/20/2015  . Elevated troponin 02/20/2015  . Hypoglycemia 02/20/2015  . Type 2 diabetes mellitus ([place]) 02/20/2015  . HTN (hypertension) 02/20/2015  . HLD (hyperlipidemia) 02/20/2015  . Paroxysmal atrial fibrillation ([place]) 02/20/2015  . GERD (gastroesophageal reflux disease) 02/20/2015    Orientation RESPIRATION BLADDER Height & Weight     Self, Time, Situation, Place  O2 (1.5 liters oxygen ) Incontinent Weight: 232 lb (105.2 kg) Height:  4\' 11"  (149.9 cm)  BEHAVIORAL SYMPTOMS/MOOD NEUROLOGICAL BOWEL NUTRITION STATUS   (none)  (none) Continent Diet (Diet: Heart Healthy )  AMBULATORY STATUS COMMUNICATION OF NEEDS Skin   Extensive Assist Verbally Other (Comment) (non-pressure wound on right leg. )                       Personal Care Assistance Level of Assistance  Bathing, Feeding, Dressing Bathing Assistance: Limited assistance Feeding assistance: Independent Dressing Assistance: Limited assistance     Functional Limitations Info  Sight, Hearing, Speech Sight Info: Adequate Hearing Info: Adequate Speech Info: Adequate    SPECIAL CARE FACTORS  FREQUENCY  PT (By licensed PT), OT (By licensed OT)     PT Frequency:  (5) OT Frequency:  (5)            Contractures      Additional Factors Info  Code Status, Allergies, Insulin Sliding Scale, Isolation Precautions Code Status Info:  (Full Code. ) Allergies Info:  (No Known Allergies. )   Insulin Sliding Scale Info:  (NovoLog Insulin Injections. ) Isolation Precautions Info:  (Positive for the flu. )     Current Medications (09/02/2016):  This is the current hospital active medication list Current Facility-Administered Medications  Medication Dose Route Frequency Provider Last Rate Last Dose  . acetaminophen (TYLENOL) tablet 650 mg  650 mg Oral Q6H PRN Henreitta Leber, MD   650 mg at 08/30/16 1528   Or  . acetaminophen (TYLENOL) suppository 650 mg  650 mg Rectal Q6H PRN Henreitta Leber, MD      . benzonatate (TESSALON) capsule 100 mg  100 mg Oral TID Gladstone Lighter, MD   100 mg at 09/02/16 0926  . clopidogrel (PLAVIX) tablet 75 mg  75 mg Oral Daily Henreitta Leber, MD   75 mg at 09/02/16 0925  . enoxaparin (LOVENOX) injection 40 mg  40 mg Subcutaneous Q12H Henreitta Leber, MD   40 mg at 09/02/16 0926  . feeding supplement (GLUCERNA SHAKE) (GLUCERNA SHAKE) liquid 237 mL  237 mL Oral TID BM Gladstone Lighter, MD   237 mL at 09/01/16 2155  . furosemide (LASIX) tablet 20 mg  20 mg Oral Daily Gladstone Lighter,  MD   20 mg at 09/02/16 0926  . guaiFENesin (MUCINEX) 12 hr tablet 600 mg  600 mg Oral BID Gladstone Lighter, MD   600 mg at 09/02/16 0926  . insulin aspart (novoLOG) injection 0-5 Units  0-5 Units Subcutaneous QHS Henreitta Leber, MD   2 Units at 08/28/16 2225  . insulin aspart (novoLOG) injection 0-9 Units  0-9 Units Subcutaneous TID WC Henreitta Leber, MD   5 Units at 09/02/16 [id number]  . ipratropium (ATROVENT) 0.03 % nasal spray 2 spray  2 spray Each Nare TID Henreitta Leber, MD   2 spray at 09/02/16 [id number]  . ipratropium-albuterol (DUONEB) 0.5-2.5 (3) MG/3ML nebulizer  solution 3 mL  3 mL Nebulization Q6H PRN Bettey Costa, MD   3 mL at 09/01/16 1424  . levothyroxine (SYNTHROID, LEVOTHROID) tablet 50 mcg  50 mcg Oral QAC breakfast Henreitta Leber, MD   50 mcg at 09/02/16 0926  . metoprolol tartrate (LOPRESSOR) tablet 25 mg  25 mg Oral TID Fritzi Mandes, MD   25 mg at 09/02/16 0926  . ondansetron (ZOFRAN) injection 4 mg  4 mg Intravenous Once Lisa Roca, MD   Stopped at 08/28/16 1230  . ondansetron (ZOFRAN) tablet 4 mg  4 mg Oral Q6H PRN Henreitta Leber, MD       Or  . ondansetron (ZOFRAN) injection 4 mg  4 mg Intravenous Q6H PRN Henreitta Leber, MD      . oxyCODONE-acetaminophen (PERCOCET/ROXICET) 5-325 MG per tablet 1 tablet  1 tablet Oral BID PRN Henreitta Leber, MD      . pantoprazole (PROTONIX) EC tablet 40 mg  40 mg Oral Daily Henreitta Leber, MD   40 mg at 09/02/16 0926  . paricalcitol (ZEMPLAR) capsule 1 mcg  1 mcg Oral Once per [place] on Mon Wed Fri Henreitta Leber, MD   1 mcg at 09/02/16 [id number]  . polyethylene glycol (MIRALAX / GLYCOLAX) packet 17 g  17 g Oral Daily PRN Henreitta Leber, MD      . ranolazine (RANEXA) 12 hr tablet 500 mg  500 mg Oral BID Henreitta Leber, MD   500 mg at 09/02/16 0926  . rosuvastatin (CRESTOR) tablet 10 mg  10 mg Oral Daily Henreitta Leber, MD   10 mg at 09/02/16 0926  . vancomycin (VANCOCIN) 50 mg/mL oral solution 125 mg  125 mg Oral Q6H Gladstone Lighter, MD   125 mg at 09/02/16 [id number]     Discharge Medications: Please see discharge summary for a list of discharge medications.  Relevant Imaging Results:  Relevant Lab Results:   Additional Information  (SSN: [id number])  Lewi Drost, Veronia Beets, LCSW

## 2016-09-25 ENCOUNTER — Inpatient Hospital Stay
Admission: EM | Admit: 2016-09-25 | Discharge: 2016-09-26 | DRG: 371 | Disposition: A | Discharge status: Home / Self Care | Payer: Medicare Other | Attending: Internal Medicine | Admitting: Internal Medicine

## 2016-09-25 ENCOUNTER — Encounter: Payer: Self-pay | Admitting: Emergency Medicine

## 2016-09-25 DIAGNOSIS — I2510 Atherosclerotic heart disease of native coronary artery without angina pectoris: Secondary | ICD-10-CM | POA: Diagnosis present

## 2016-09-25 DIAGNOSIS — K219 Gastro-esophageal reflux disease without esophagitis: Secondary | ICD-10-CM | POA: Diagnosis present

## 2016-09-25 DIAGNOSIS — Z853 Personal history of malignant neoplasm of breast: Secondary | ICD-10-CM | POA: Diagnosis not present

## 2016-09-25 DIAGNOSIS — Z801 Family history of malignant neoplasm of trachea, bronchus and lung: Secondary | ICD-10-CM

## 2016-09-25 DIAGNOSIS — Z9012 Acquired absence of left breast and nipple: Secondary | ICD-10-CM

## 2016-09-25 DIAGNOSIS — I10 Essential (primary) hypertension: Secondary | ICD-10-CM | POA: Diagnosis present

## 2016-09-25 DIAGNOSIS — I480 Paroxysmal atrial fibrillation: Secondary | ICD-10-CM | POA: Diagnosis present

## 2016-09-25 DIAGNOSIS — Z955 Presence of coronary angioplasty implant and graft: Secondary | ICD-10-CM | POA: Diagnosis not present

## 2016-09-25 DIAGNOSIS — E785 Hyperlipidemia, unspecified: Secondary | ICD-10-CM | POA: Diagnosis present

## 2016-09-25 DIAGNOSIS — Z7902 Long term (current) use of antithrombotics/antiplatelets: Secondary | ICD-10-CM | POA: Diagnosis not present

## 2016-09-25 DIAGNOSIS — E039 Hypothyroidism, unspecified: Secondary | ICD-10-CM | POA: Diagnosis present

## 2016-09-25 DIAGNOSIS — I959 Hypotension, unspecified: Secondary | ICD-10-CM | POA: Diagnosis present

## 2016-09-25 DIAGNOSIS — Z794 Long term (current) use of insulin: Secondary | ICD-10-CM

## 2016-09-25 DIAGNOSIS — Z6841 Body Mass Index (BMI) 40.0 and over, adult: Secondary | ICD-10-CM

## 2016-09-25 DIAGNOSIS — L8990 Pressure ulcer of unspecified site, unspecified stage: Secondary | ICD-10-CM | POA: Insufficient documentation

## 2016-09-25 DIAGNOSIS — E119 Type 2 diabetes mellitus without complications: Secondary | ICD-10-CM | POA: Diagnosis present

## 2016-09-25 DIAGNOSIS — L89322 Pressure ulcer of left buttock, stage 2: Secondary | ICD-10-CM | POA: Diagnosis present

## 2016-09-25 DIAGNOSIS — R197 Diarrhea, unspecified: Secondary | ICD-10-CM | POA: Diagnosis present

## 2016-09-25 DIAGNOSIS — L89312 Pressure ulcer of right buttock, stage 2: Secondary | ICD-10-CM | POA: Diagnosis present

## 2016-09-25 DIAGNOSIS — Z841 Family history of disorders of kidney and ureter: Secondary | ICD-10-CM

## 2016-09-25 DIAGNOSIS — N170 Acute kidney failure with tubular necrosis: Secondary | ICD-10-CM | POA: Diagnosis present

## 2016-09-25 DIAGNOSIS — A0472 Enterocolitis due to Clostridium difficile, not specified as recurrent: Principal | ICD-10-CM | POA: Diagnosis present

## 2016-09-25 DIAGNOSIS — Z9049 Acquired absence of other specified parts of digestive tract: Secondary | ICD-10-CM

## 2016-09-25 DIAGNOSIS — E876 Hypokalemia: Secondary | ICD-10-CM | POA: Diagnosis present

## 2016-09-25 DIAGNOSIS — E860 Dehydration: Secondary | ICD-10-CM | POA: Diagnosis present

## 2016-09-25 LAB — GLUCOSE, CAPILLARY
Glucose-Capillary: 105 mg/dL — ABNORMAL HIGH (ref 65–99)
Glucose-Capillary: 94 mg/dL (ref 65–99)
Glucose-Capillary: 159 mg/dL — ABNORMAL HIGH (ref 65–99)
Glucose-Capillary: 151 mg/dL — ABNORMAL HIGH (ref 65–99)

## 2016-09-25 LAB — BASIC METABOLIC PANEL
Anion gap: 11 (ref 5–15)
BUN: 44 mg/dL — AB (ref 6–20)
CALCIUM: 8.1 mg/dL — AB (ref 8.9–10.3)
CO2: 25 mmol/L (ref 22–32)
CREATININE: 2.48 mg/dL — AB (ref 0.44–1.00)
Chloride: 95 mmol/L — ABNORMAL LOW (ref 101–111)
GFR calc Af Amer: 20 mL/min — ABNORMAL LOW (ref >60)
GFR, EST NON AFRICAN AMERICAN: 17 mL/min — AB (ref >60)
Glucose, Bld: 109 mg/dL — ABNORMAL HIGH (ref 65–99)
Potassium: 3.3 mmol/L — ABNORMAL LOW (ref 3.5–5.1)
SODIUM: 131 mmol/L — AB (ref 135–145)

## 2016-09-25 LAB — GASTROINTESTINAL PANEL BY PCR, STOOL (REPLACES STOOL CULTURE)
ADENOVIRUS F40/41: NOT DETECTED
ASTROVIRUS: NOT DETECTED
CYCLOSPORA CAYETANENSIS: NOT DETECTED
Campylobacter species: NOT DETECTED
Cryptosporidium: NOT DETECTED
ENTAMOEBA HISTOLYTICA: NOT DETECTED
ENTEROTOXIGENIC E COLI (ETEC): NOT DETECTED
Enteroaggregative E coli (EAEC): NOT DETECTED
Enteropathogenic E coli (EPEC): NOT DETECTED
Giardia lamblia: NOT DETECTED
Norovirus GI/GII: NOT DETECTED
Plesimonas shigelloides: NOT DETECTED
Rotavirus A: NOT DETECTED
SAPOVIRUS (I, II, IV, AND V): NOT DETECTED
SHIGA LIKE TOXIN PRODUCING E COLI (STEC): NOT DETECTED
Salmonella species: NOT DETECTED
Shigella/Enteroinvasive E coli (EIEC): NOT DETECTED
VIBRIO CHOLERAE: NOT DETECTED
VIBRIO SPECIES: NOT DETECTED
Yersinia enterocolitica: NOT DETECTED

## 2016-09-25 LAB — CBC
HCT: 34.1 % — ABNORMAL LOW (ref 35.0–47.0)
Hemoglobin: 11.8 g/dL — ABNORMAL LOW (ref 12.0–16.0)
MCH: 29.8 pg (ref 26.0–34.0)
MCHC: 34.5 g/dL (ref 32.0–36.0)
MCV: 86.4 fL (ref 80.0–100.0)
PLATELETS: 250 10*3/uL (ref 150–440)
RBC: 3.95 MIL/uL (ref 3.80–5.20)
RDW: 15.9 % — AB (ref 11.5–14.5)
WBC: 10.1 10*3/uL (ref 3.6–11.0)

## 2016-09-25 LAB — CLOSTRIDIUM DIFFICILE BY PCR: Toxigenic C. Difficile by PCR: POSITIVE — AB

## 2016-09-25 LAB — C DIFFICILE QUICK SCREEN W PCR REFLEX
C DIFFICLE (CDIFF) ANTIGEN: NEGATIVE
C Diff toxin: POSITIVE — AB

## 2016-09-25 MED ORDER — PARICALCITOL 5 MCG/ML IV SOLN
1.0000 ug | INTRAVENOUS | Status: DC
  Filled 2016-09-25 (×2): qty 0.2

## 2016-09-25 MED ORDER — GLUCERNA SHAKE PO LIQD
237.0000 mL | Freq: Three times a day (TID) | ORAL | Status: DC
  Administered 2016-09-25 – 2016-09-26 (×2): 237 mL via ORAL

## 2016-09-25 MED ORDER — CLOPIDOGREL BISULFATE 75 MG PO TABS
75.0000 mg | ORAL_TABLET | Freq: Every day | ORAL | Status: DC
Start: 2016-09-25 — End: 2016-09-26
  Administered 2016-09-25 – 2016-09-26 (×2): 75 mg via ORAL
  Filled 2016-09-25 (×2): qty 1

## 2016-09-25 MED ORDER — FERROUS SULFATE 325 (65 FE) MG PO TABS
325.0000 mg | ORAL_TABLET | Freq: Two times a day (BID) | ORAL | Status: DC
  Administered 2016-09-26: 325 mg via ORAL
  Filled 2016-09-25 (×2): qty 1

## 2016-09-25 MED ORDER — INSULIN ASPART PROT & ASPART (70-30 MIX) 100 UNIT/ML ~~LOC~~ SUSP: 18.0000 [IU] | Freq: Two times a day (BID) | SUBCUTANEOUS | Status: DC

## 2016-09-25 MED ORDER — OXYCODONE-ACETAMINOPHEN 5-325 MG PO TABS: 1.0000 | ORAL_TABLET | Freq: Two times a day (BID) | ORAL | Status: DC | PRN

## 2016-09-25 MED ORDER — SODIUM CHLORIDE 0.9 % IV BOLUS (SEPSIS)
500.0000 mL | Freq: Once | INTRAVENOUS | Status: AC
  Administered 2016-09-25: 500 mL via INTRAVENOUS

## 2016-09-25 MED ORDER — METOPROLOL TARTRATE 25 MG PO TABS
25.0000 mg | ORAL_TABLET | Freq: Two times a day (BID) | ORAL | Status: DC
  Filled 2016-09-25: qty 1

## 2016-09-25 MED ORDER — IPRATROPIUM BROMIDE 0.03 % NA SOLN
2.0000 | Freq: Three times a day (TID) | NASAL | Status: DC
Start: 2016-09-25 — End: 2016-09-26
  Administered 2016-09-25 – 2016-09-26 (×2): 2 via NASAL
  Filled 2016-09-25: qty 30

## 2016-09-25 MED ORDER — ACETAMINOPHEN 650 MG RE SUPP: 650.0000 mg | Freq: Four times a day (QID) | RECTAL | Status: DC | PRN

## 2016-09-25 MED ORDER — MOMETASONE FURO-FORMOTEROL FUM 200-5 MCG/ACT IN AERO
2.0000 | INHALATION_SPRAY | Freq: Two times a day (BID) | RESPIRATORY_TRACT | Status: DC
  Administered 2016-09-25 – 2016-09-26 (×3): 2 via RESPIRATORY_TRACT
  Filled 2016-09-25: qty 8.8

## 2016-09-25 MED ORDER — METOPROLOL TARTRATE 25 MG PO TABS
12.5000 mg | ORAL_TABLET | Freq: Two times a day (BID) | ORAL | Status: DC
  Administered 2016-09-25: 12.5 mg via ORAL

## 2016-09-25 MED ORDER — RANOLAZINE ER 500 MG PO TB12
500.0000 mg | ORAL_TABLET | Freq: Two times a day (BID) | ORAL | Status: DC
  Administered 2016-09-25 – 2016-09-26 (×3): 500 mg via ORAL
  Filled 2016-09-25 (×5): qty 1

## 2016-09-25 MED ORDER — POTASSIUM CHLORIDE IN NACL 20-0.9 MEQ/L-% IV SOLN
INTRAVENOUS | Status: DC
  Administered 2016-09-25 – 2016-09-26 (×2): via INTRAVENOUS
  Filled 2016-09-25 (×2): qty 1000

## 2016-09-25 MED ORDER — ONDANSETRON HCL 4 MG/2ML IJ SOLN: 4.0000 mg | Freq: Four times a day (QID) | INTRAMUSCULAR | Status: DC | PRN

## 2016-09-25 MED ORDER — ENOXAPARIN SODIUM 40 MG/0.4ML ~~LOC~~ SOLN
30.0000 mg | SUBCUTANEOUS | Status: DC
  Filled 2016-09-25: qty 0.4

## 2016-09-25 MED ORDER — GUAIFENESIN ER 600 MG PO TB12
600.0000 mg | ORAL_TABLET | Freq: Two times a day (BID) | ORAL | Status: DC
  Administered 2016-09-25 – 2016-09-26 (×3): 600 mg via ORAL
  Filled 2016-09-25 (×3): qty 1

## 2016-09-25 MED ORDER — POLYETHYLENE GLYCOL 3350 17 G PO PACK: 17.0000 g | PACK | Freq: Every day | ORAL | Status: DC | PRN

## 2016-09-25 MED ORDER — ROSUVASTATIN CALCIUM 10 MG PO TABS
10.0000 mg | ORAL_TABLET | Freq: Every day | ORAL | Status: DC
  Administered 2016-09-25 – 2016-09-26 (×2): 10 mg via ORAL
  Filled 2016-09-25 (×2): qty 1

## 2016-09-25 MED ORDER — PANTOPRAZOLE SODIUM 40 MG PO TBEC
40.0000 mg | DELAYED_RELEASE_TABLET | Freq: Every day | ORAL | Status: DC
  Administered 2016-09-25: 40 mg via ORAL
  Filled 2016-09-25: qty 1

## 2016-09-25 MED ORDER — ACETAMINOPHEN 325 MG PO TABS: 650.0000 mg | ORAL_TABLET | Freq: Four times a day (QID) | ORAL | Status: DC | PRN

## 2016-09-25 MED ORDER — SACCHAROMYCES BOULARDII 250 MG PO CAPS
250.0000 mg | ORAL_CAPSULE | Freq: Two times a day (BID) | ORAL | Status: DC
  Administered 2016-09-25 – 2016-09-26 (×3): 250 mg via ORAL
  Filled 2016-09-25 (×4): qty 1

## 2016-09-25 MED ORDER — INSULIN ASPART PROT & ASPART (70-30 MIX) 100 UNIT/ML ~~LOC~~ SUSP
10.0000 [IU] | Freq: Two times a day (BID) | SUBCUTANEOUS | Status: DC
  Administered 2016-09-25 – 2016-09-26 (×2): 10 [IU] via SUBCUTANEOUS
  Filled 2016-09-25 (×2): qty 10

## 2016-09-25 MED ORDER — POTASSIUM CHLORIDE CRYS ER 20 MEQ PO TBCR
40.0000 meq | EXTENDED_RELEASE_TABLET | ORAL | Status: AC
  Administered 2016-09-25 (×2): 40 meq via ORAL
  Filled 2016-09-25 (×2): qty 2

## 2016-09-25 MED ORDER — HEPARIN SODIUM (PORCINE) 5000 UNIT/ML IJ SOLN
5000.0000 [IU] | Freq: Three times a day (TID) | INTRAMUSCULAR | Status: DC
  Administered 2016-09-25 – 2016-09-26 (×2): 5000 [IU] via SUBCUTANEOUS
  Filled 2016-09-25 (×3): qty 1

## 2016-09-25 MED ORDER — METRONIDAZOLE 500 MG PO TABS
500.0000 mg | ORAL_TABLET | Freq: Three times a day (TID) | ORAL | Status: DC
  Administered 2016-09-25 – 2016-09-26 (×4): 500 mg via ORAL
  Filled 2016-09-25 (×4): qty 1

## 2016-09-25 MED ORDER — LEVOTHYROXINE SODIUM 50 MCG PO TABS
50.0000 ug | ORAL_TABLET | Freq: Every day | ORAL | Status: DC
  Administered 2016-09-25 – 2016-09-26 (×2): 50 ug via ORAL
  Filled 2016-09-25 (×2): qty 1

## 2016-09-25 MED ORDER — ALBUTEROL SULFATE (2.5 MG/3ML) 0.083% IN NEBU: 2.5000 mg | INHALATION_SOLUTION | RESPIRATORY_TRACT | Status: DC | PRN

## 2016-09-25 MED ORDER — ONDANSETRON HCL 4 MG PO TABS: 4.0000 mg | ORAL_TABLET | Freq: Four times a day (QID) | ORAL | Status: DC | PRN

## 2016-09-25 MED ORDER — INSULIN ASPART 100 UNIT/ML ~~LOC~~ SOLN
0.0000 [IU] | Freq: Three times a day (TID) | SUBCUTANEOUS | Status: DC
  Administered 2016-09-26 (×2): 1 [IU] via SUBCUTANEOUS
  Filled 2016-09-25 (×2): qty 1

## 2016-09-25 MED ORDER — PARICALCITOL 1 MCG PO CAPS
1.0000 ug | ORAL_CAPSULE | ORAL | Status: DC
  Filled 2016-09-25: qty 1

## 2016-09-25 MED ORDER — VANCOMYCIN HCL IN DEXTROSE 1-5 GM/200ML-% IV SOLN
1000.0000 mg | Freq: Once | INTRAVENOUS | Status: AC
  Administered 2016-09-25: 1000 mg via INTRAVENOUS
  Filled 2016-09-25: qty 200

## 2016-09-25 MED ORDER — SODIUM CHLORIDE 0.9 % IV SOLN
Freq: Once | INTRAVENOUS | Status: AC
  Administered 2016-09-25: 04:00:00 via INTRAVENOUS

## 2016-09-25 NOTE — Progress Notes (Addendum)
Notified Dr. Darvin Neighbours of low BP will discontinue metoprolol will continue to monitor

## 2016-09-25 NOTE — Progress Notes (Signed)
Per Dr. Darvin Neighbours give patient 10 units of 70/30 insulin and do not give sliding scale coverage

## 2016-09-25 NOTE — ED Notes (Signed)
Pt resting in bed, resp even and unlabored, pt made aware of pending admission

## 2016-09-25 NOTE — ED Triage Notes (Signed)
Pt arrived to ED by EMS from home. EMS reports pt became dizzy and fell onto floor, no LOC, denies hitting head. Pt recently d/c from [facility] on Monday, where she was in rehab post pneumonia. Pt has also had diarrhea x4 days. Denies N/V.

## 2016-09-25 NOTE — Progress Notes (Addendum)
Per Dr. Darvin Neighbours decrease 70/30 insulin dose to 10 units BID and hold off on paricalcitrol

## 2016-09-25 NOTE — ED Provider Notes (Signed)
[facility] Emergency Department Provider Note    First MD Initiated Contact with Patient 09/25/16 [phone]     (approximate)  I have reviewed the triage vital signs and the nursing notes.   HISTORY  Chief Complaint Fall    HPI Regina [name] is a 83 y.o. female with below list of chronic medical conditions presents to the emergency department with four-Outten history of diarrhea and one episode of vomiting today. Patient states that she has dizziness upon standing tonight in an episode of falling however denies any head injury no loss of consciousness. Patient was discharged from [place] home on Monday following rehabilitation from pneumonia. Patient denies any abdominal discomfort.   Past Medical History:  Diagnosis Date  . Breast cancer, left [facility]) 2013   Mastectomy.   . CAD (coronary artery disease)    s/p cath in 2009 with 2 stents  . Diabetes mellitus without complication ([place])   . GERD (gastroesophageal reflux disease)   . HLD (hyperlipidemia)   . Hypertension   . Hypothyroidism   . Paroxysmal atrial fibrillation [facility])     Patient Active Problem List   Diagnosis Date Noted  . Sepsis ([place]) 08/28/2016  . Cellulitis 06/26/2016  . UTI (lower urinary tract infection) 02/20/2015  . Elevated troponin 02/20/2015  . Hypoglycemia 02/20/2015  . Type 2 diabetes mellitus ([place]) 02/20/2015  . HTN (hypertension) 02/20/2015  . HLD (hyperlipidemia) 02/20/2015  . Paroxysmal atrial fibrillation ([place]) 02/20/2015  . GERD (gastroesophageal reflux disease) 02/20/2015    Past Surgical History:  Procedure Laterality Date  . BREAST LUMPECTOMY    . CHOLECYSTECTOMY    . MASTECTOMY Left     Prior to Admission medications   Medication Sig Start Date End Date Taking? Authorizing Provider  acetaminophen (TYLENOL) 325 MG tablet Take 2 tablets (650 mg total) by mouth every 6 (six) hours as needed for mild pain (or Fever >/= 101). 02/21/15   Nicholes Mango,  MD  bumetanide (BUMEX) 2 MG tablet Take 2 mg by mouth daily.    Historical Provider, MD  clopidogrel (PLAVIX) 75 MG tablet Take 75 mg by mouth daily.    Historical Provider, MD  esomeprazole (NEXIUM) 40 MG capsule Take 40 mg by mouth daily.     Historical Provider, MD  feeding supplement, GLUCERNA SHAKE, (GLUCERNA SHAKE) LIQD Take 237 mLs by mouth 3 (three) times daily between meals. 09/02/16   Fritzi Mandes, MD  ferrous sulfate 325 (65 FE) MG tablet Take 1 tablet (325 mg total) by mouth 2 (two) times daily with a meal. Patient not taking: Reported on 08/28/2016 07/01/16   Demetrios Loll, MD  furosemide (LASIX) 20 MG tablet Take 1 tablet (20 mg total) by mouth daily. Patient not taking: Reported on 08/28/2016 07/01/16   Demetrios Loll, MD  guaiFENesin (MUCINEX) 600 MG 12 hr tablet Take 1 tablet (600 mg total) by mouth 2 (two) times daily as needed. 09/02/16   Fritzi Mandes, MD  insulin aspart protamine- aspart (NOVOLOG MIX 70/30) (70-30) 100 UNIT/ML injection Inject 0.28 mLs (28 Units total) into the skin 2 (two) times daily with a meal. Patient taking differently: Inject 55 Units into the skin daily with breakfast.  02/21/15   Nicholes Mango, MD  ipratropium (ATROVENT) 0.03 % nasal spray Place 2 sprays into both nostrils 3 (three) times daily.    Historical Provider, MD  ipratropium-albuterol (DUONEB) 0.5-2.5 (3) MG/3ML SOLN Take 3 mLs by nebulization every 6 (six) hours as needed. 09/02/16  Fritzi Mandes, MD  levothyroxine (SYNTHROID, LEVOTHROID) 50 MCG tablet Take 50 mcg by mouth daily.    Historical Provider, MD  lisinopril (PRINIVIL,ZESTRIL) 20 MG tablet Take 20 mg by mouth daily. 08/25/16   Historical Provider, MD  metoprolol tartrate (LOPRESSOR) 25 MG tablet Take 1 tablet (25 mg total) by mouth 2 (two) times daily. 07/01/16   Demetrios Loll, MD  oxyCODONE-acetaminophen (PERCOCET/ROXICET) 5-325 MG tablet Take 1 tablet by mouth 2 (two) times daily as needed for severe pain.    Historical Provider, MD  paricalcitol (ZEMPLAR) 1  MCG capsule Take 1 mcg by mouth 3 (three) times a week. Pt takes on Monday, Wednesday, and Friday.    Historical Provider, MD  polyethylene glycol (MIRALAX / GLYCOLAX) packet Take 17 g by mouth daily as needed for mild constipation. 07/01/16   Demetrios Loll, MD  ranolazine (RANEXA) 500 MG 12 hr tablet Take 500 mg by mouth 2 (two) times daily.    Historical Provider, MD  rosuvastatin (CRESTOR) 10 MG tablet Take 10 mg by mouth daily.    Historical Provider, MD    Allergies Patient has no known allergies.  Family History  Problem Relation Age of Onset  . Kidney failure Mother   . Lung cancer Father     Social History Social History  Substance Use Topics  . Smoking status: Never Smoker  . Smokeless tobacco: Never Used  . Alcohol use No    Review of Systems Constitutional: No fever/chills Eyes: No visual changes. ENT: No sore throat. Cardiovascular: Denies chest pain. Respiratory: Denies shortness of breath. Gastrointestinal: No abdominal pain.  No nausea, no vomiting.  No diarrhea.  No constipation. Genitourinary: Negative for dysuria. Musculoskeletal: Negative for back pain. Skin: Negative for rash. Neurological: Negative for headaches, focal weakness or numbness.  10-point ROS otherwise negative.  ____________________________________________   PHYSICAL EXAM:  VITAL SIGNS: ED Triage Vitals  Enc Vitals Group     BP 09/25/16 0114 (!) 107/59     Pulse Rate 09/25/16 0114 (!) 101     Resp 09/25/16 0114 16     Temp 09/25/16 0114 97.4 F (36.3 C)     Temp Source 09/25/16 0114 Oral     SpO2 09/25/16 0114 100 %     Weight 09/25/16 0113 232 lb (105.2 kg)     Height --      Head Circumference --      Peak Flow --      Pain Score --      Pain Loc --      Pain Edu? --      Excl. in [place]? --     Constitutional: Alert and oriented. Well appearing and in no acute distress. Eyes: Conjunctivae are normal. PERRL. EOMI. Head: Atraumatic.Marland Kitchen Mouth/Throat: Mucous membranes are  moist. Neck: No stridor.   Cardiovascular: Normal rate, regular rhythm. Good peripheral circulation. Grossly normal heart sounds. Respiratory: Normal respiratory effort.  No retractions. Lungs CTAB. Gastrointestinal: Soft and nontender. No distention.  Musculoskeletal: No lower extremity tenderness nor edema. No gross deformities of extremities. Neurologic:  Normal speech and language. No gross focal neurologic deficits are appreciated.  Skin:  Skin is warm, dry and intact. No rash noted. Psychiatric: Mood and affect are normal. Speech and behavior are normal.  ____________________________________________   LABS (all labs ordered are listed, but only abnormal results are displayed)  Labs Reviewed  C DIFFICILE QUICK SCREEN W PCR REFLEX - Abnormal; Notable for the following:       Result Value  C Diff toxin POSITIVE (*)    All other components within normal limits  CBC - Abnormal; Notable for the following:    Hemoglobin 11.8 (*)    HCT 34.1 (*)    RDW 15.9 (*)    All other components within normal limits  BASIC METABOLIC PANEL - Abnormal; Notable for the following:    Sodium 131 (*)    Potassium 3.3 (*)    Chloride 95 (*)    Glucose, Bld 109 (*)    BUN 44 (*)    Creatinine, Ser 2.48 (*)    Calcium 8.1 (*)    GFR calc non Af Amer 17 (*)    GFR calc Af Amer 20 (*)    All other components within normal limits  GASTROINTESTINAL PANEL BY PCR, STOOL (REPLACES STOOL CULTURE)  CLOSTRIDIUM DIFFICILE BY PCR   ____________________________________________  EKG  ED ECG REPORT I, Lattimore N BROWN, the attending physician, personally viewed and interpreted this ECG.   Date: 09/25/2016  EKG Time: 1:29 AM  Rate: 100  Rhythm: Sinus tachycardia  Axis: Normal  Intervals: Normal  ST&T Change: None      Procedures     INITIAL IMPRESSION / ASSESSMENT AND PLAN / ED COURSE  Pertinent labs & imaging results that were available during my care of the patient were reviewed by me  and considered in my medical decision making (see chart for details).  83 year old female presenting with diarrhea status post antibiotic treatment concerning for possible C. difficile which was firm on stool sample. Patient hypotensive in the emergency department blood pressure 92/56. Patient receiving 2 L IV normal saline will receive IV vancomycin. Patient discussed with Dr. Marcille Blanco for hospital admission for further evaluation and management.      ____________________________________________  FINAL CLINICAL IMPRESSION(S) / ED DIAGNOSES  Final diagnoses:  C. difficile colitis     MEDICATIONS GIVEN DURING THIS VISIT:  Medications  vancomycin (VANCOCIN) IVPB 1000 mg/200 mL premix (not administered)  sodium chloride 0.9 % bolus 500 mL (500 mLs Intravenous New Bag/Given 09/25/16 0240)  0.9 %  sodium chloride infusion ( Intravenous New Bag/Given 09/25/16 0337)     NEW OUTPATIENT MEDICATIONS STARTED DURING THIS VISIT:  New Prescriptions   No medications on file    Modified Medications   No medications on file    Discontinued Medications   No medications on file     Note:  This document was prepared using Dragon voice recognition software and may include unintentional dictation errors.    Gregor Hams, MD 09/25/16 [phone]

## 2016-09-25 NOTE — H&P (Signed)
[facility] [place] NAME: Regina [name]    MR#:  [id number]  DATE OF BIRTH:  1934-02-09  DATE OF ADMISSION:  09/25/2016  PRIMARY CARE PHYSICIAN: Donnie Coffin, MD   REQUESTING/REFERRING PHYSICIAN: Dr. Owens Shark  CHIEF COMPLAINT:   Chief Complaint  Patient presents with  . Fall    HISTORY OF PRESENT ILLNESS:  Regina [name]  is a 83 y.o. female with a known history of Diabetes, hypertension, recent UTI on antibiotics and discharged home from rehabilitation 2 days back presents to the emergency room with 6 days of severe diarrhea which is watery. Mild abdominal cramping but no significant pain. Some nausea but no vomiting. Poor appetite. Afebrile. Patient in the emergency room has been found to be C. difficile positive along with acute kidney injury and hypotension and is being admitted to the hospitalist service. He has received 1 dose of IV vancomycin.  No prior history of C. difficile.  PAST MEDICAL HISTORY:   Past Medical History:  Diagnosis Date  . Breast cancer, left [facility]) 2013   Mastectomy.   . CAD (coronary artery disease)    s/p cath in 2009 with 2 stents  . Diabetes mellitus without complication ([place])   . GERD (gastroesophageal reflux disease)   . HLD (hyperlipidemia)   . Hypertension   . Hypothyroidism   . Paroxysmal atrial fibrillation ([place])     PAST SURGICAL HISTORY:   Past Surgical History:  Procedure Laterality Date  . BREAST LUMPECTOMY    . CHOLECYSTECTOMY    . MASTECTOMY Left     SOCIAL HISTORY:   Social History  Substance Use Topics  . Smoking status: Never Smoker  . Smokeless tobacco: Never Used  . Alcohol use No    FAMILY HISTORY:   Family History  Problem Relation Age of Onset  . Kidney failure Mother   . Lung cancer Father     DRUG ALLERGIES:  No Known Allergies  REVIEW OF SYSTEMS:   Review of Systems  Constitutional: Positive for malaise/fatigue. Negative for chills and fever.  HENT:  Negative for sore throat.   Eyes: Negative for blurred vision, double vision and pain.  Respiratory: Negative for cough, hemoptysis, shortness of breath and wheezing.   Cardiovascular: Negative for chest pain, palpitations, orthopnea and leg swelling.  Gastrointestinal: Positive for diarrhea and nausea. Negative for abdominal pain, constipation, heartburn and vomiting.  Genitourinary: Negative for dysuria and hematuria.  Musculoskeletal: Negative for back pain and joint pain.  Skin: Negative for rash.  Neurological: Positive for dizziness and weakness. Negative for sensory change, speech change, focal weakness and headaches.  Endo/Heme/Allergies: Does not bruise/bleed easily.  Psychiatric/Behavioral: Negative for depression. The patient is not nervous/anxious.     MEDICATIONS AT HOME:   Prior to Admission medications   Medication Sig Start Date End Date Taking? Authorizing Provider  acetaminophen (TYLENOL) 325 MG tablet Take 2 tablets (650 mg total) by mouth every 6 (six) hours as needed for mild pain (or Fever >/= 101). 02/21/15  Yes Aruna Gouru, MD  bumetanide (BUMEX) 2 MG tablet Take 2 mg by mouth daily.   Yes Historical Provider, MD  clopidogrel (PLAVIX) 75 MG tablet Take 75 mg by mouth daily.   Yes Historical Provider, MD  esomeprazole (NEXIUM) 40 MG capsule Take 40 mg by mouth daily.    Yes Historical Provider, MD  feeding supplement, GLUCERNA SHAKE, (GLUCERNA SHAKE) LIQD Take 237 mLs by mouth 3 (three) times daily between meals. 09/02/16  Yes Fritzi Mandes, MD  ferrous sulfate 325 (65 FE) MG tablet Take 1 tablet (325 mg total) by mouth 2 (two) times daily with a meal. 07/01/16  Yes Demetrios Loll, MD  Fluticasone-Salmeterol (ADVAIR) 250-50 MCG/DOSE AEPB Inhale 1 puff into the lungs 2 (two) times daily.   Yes Historical Provider, MD  furosemide (LASIX) 20 MG tablet Take 1 tablet (20 mg total) by mouth daily. 07/01/16  Yes Demetrios Loll, MD  guaiFENesin (MUCINEX) 600 MG 12 hr tablet Take 1 tablet  (600 mg total) by mouth 2 (two) times daily as needed. 09/02/16  Yes Fritzi Mandes, MD  insulin aspart protamine- aspart (NOVOLOG MIX 70/30) (70-30) 100 UNIT/ML injection Inject 0.28 mLs (28 Units total) into the skin 2 (two) times daily with a meal. 02/21/15  Yes Aruna Gouru, MD  ipratropium (ATROVENT) 0.03 % nasal spray Place 2 sprays into both nostrils 3 (three) times daily.   Yes Historical Provider, MD  ipratropium-albuterol (DUONEB) 0.5-2.5 (3) MG/3ML SOLN Take 3 mLs by nebulization every 6 (six) hours as needed. 09/02/16  Yes Fritzi Mandes, MD  levothyroxine (SYNTHROID, LEVOTHROID) 50 MCG tablet Take 50 mcg by mouth daily.   Yes Historical Provider, MD  lisinopril (PRINIVIL,ZESTRIL) 20 MG tablet Take 20 mg by mouth daily. 08/25/16  Yes Historical Provider, MD  metoprolol tartrate (LOPRESSOR) 25 MG tablet Take 1 tablet (25 mg total) by mouth 2 (two) times daily. Patient taking differently: Take 50 mg by mouth 2 (two) times daily.  07/01/16  Yes Demetrios Loll, MD  oxyCODONE-acetaminophen (PERCOCET/ROXICET) 5-325 MG tablet Take 1 tablet by mouth 2 (two) times daily as needed for severe pain.   Yes Historical Provider, MD  paricalcitol (ZEMPLAR) 1 MCG capsule Take 1 mcg by mouth 3 (three) times a week. Pt takes on Monday, Wednesday, and Friday.   Yes Historical Provider, MD  polyethylene glycol (MIRALAX / GLYCOLAX) packet Take 17 g by mouth daily as needed for mild constipation. 07/01/16  Yes Demetrios Loll, MD  ranolazine (RANEXA) 500 MG 12 hr tablet Take 500 mg by mouth 2 (two) times daily.   Yes Historical Provider, MD  rosuvastatin (CRESTOR) 10 MG tablet Take 10 mg by mouth daily.   Yes Historical Provider, MD     VITAL SIGNS:  Blood pressure 111/67, pulse 96, temperature 97.8 F (36.6 C), temperature source Oral, resp. rate (!) 24, height 4\' 11"  (1.499 m), weight 99.7 kg (219 lb 12.8 oz), SpO2 100 %.  PHYSICAL EXAMINATION:  Physical Exam  GENERAL:  83 y.o.-year-old patient lying in the bed with no acute  distress.  EYES: Pupils equal, round, reactive to light and accommodation. No scleral icterus. Extraocular muscles intact.  HEENT: Head atraumatic, normocephalic. Oropharynx and nasopharynx clear. No oropharyngeal erythema, dry oral mucosa  NECK:  Supple, no jugular venous distention. No thyroid enlargement, no tenderness.  LUNGS: Normal breath sounds bilaterally, no wheezing, rales, rhonchi. No use of accessory muscles of respiration.  CARDIOVASCULAR: S1, S2 normal. No murmurs, rubs, or gallops.  ABDOMEN: Soft, nontender, nondistended. Bowel sounds present. No organomegaly or mass.  EXTREMITIES: No pedal edema, cyanosis, or clubbing. + 2 pedal & radial pulses b/l.   NEUROLOGIC: Cranial nerves II through XII are intact. No focal Motor or sensory deficits appreciated b/l PSYCHIATRIC: The patient is alert and oriented x 3. Good affect.  SKIN: No obvious rash, lesion, or ulcer.   LABORATORY PANEL:   CBC  Recent Labs Lab 09/25/16 0205  WBC 10.1  HGB 11.8*  HCT 34.1*  PLT  250   ------------------------------------------------------------------------------------------------------------------  Chemistries   Recent Labs Lab 09/25/16 0205  NA 131*  K 3.3*  CL 95*  CO2 25  GLUCOSE 109*  BUN 44*  CREATININE 2.48*  CALCIUM 8.1*   ------------------------------------------------------------------------------------------------------------------  Cardiac Enzymes No results for input(s): TROPONINI in the last 168 hours. ------------------------------------------------------------------------------------------------------------------  RADIOLOGY:  No results found.   IMPRESSION AND PLAN:   * C diff diarrhea Enteric isolation. Start oral Flagyl.  * Acute kidney injury due to dehydration and ATN from hypotension Start IV fluids. Monitor input and output. We'll repeat labs in the morning.  * Hypokalemia Replace orally. This is due to diarrhea.  * Diabetes mellitus Continue  insulin 70/30 at reduced dose. Sliding scale insulin.  * Hypertension Continue metoprolol. Hold other blood pressure medications.  * DVT prophylaxis with Lovenox.   All the records are reviewed and case discussed with ED provider. Management plans discussed with the patient, family and they are in agreement.  CODE STATUS: FULL CODE  TOTAL TIME TAKING CARE OF THIS PATIENT: 40 minutes.   Hillary Bow R M.D on 09/25/2016 at 10:42 AM  Between 7am to 6pm - Pager - [phone]  After 6pm go to [email or web address] - password [email or web address] [facility]  Office  [phone]  CC: Primary care physician; Donnie Coffin, MD  Note: This dictation was prepared with Dragon dictation along with smaller phrase technology. Any transcriptional errors that result from this process are unintentional.

## 2016-09-26 DIAGNOSIS — L8990 Pressure ulcer of unspecified site, unspecified stage: Secondary | ICD-10-CM | POA: Insufficient documentation

## 2016-09-26 LAB — BASIC METABOLIC PANEL
ANION GAP: 7 (ref 5–15)
BUN: 33 mg/dL — AB (ref 6–20)
CALCIUM: 7.8 mg/dL — AB (ref 8.9–10.3)
CO2: 21 mmol/L — ABNORMAL LOW (ref 22–32)
Chloride: 108 mmol/L (ref 101–111)
Creatinine, Ser: 1.71 mg/dL — ABNORMAL HIGH (ref 0.44–1.00)
GFR calc Af Amer: 31 mL/min — ABNORMAL LOW (ref >60)
GFR calc non Af Amer: 27 mL/min — ABNORMAL LOW (ref >60)
GLUCOSE: 144 mg/dL — AB (ref 65–99)
POTASSIUM: 5.1 mmol/L (ref 3.5–5.1)
Sodium: 136 mmol/L (ref 135–145)

## 2016-09-26 LAB — CBC
HEMATOCRIT: 29.4 % — AB (ref 35.0–47.0)
HEMOGLOBIN: 9.8 g/dL — AB (ref 12.0–16.0)
MCH: 29.4 pg (ref 26.0–34.0)
MCHC: 33.4 g/dL (ref 32.0–36.0)
MCV: 87.9 fL (ref 80.0–100.0)
Platelets: 180 10*3/uL (ref 150–440)
RBC: 3.34 MIL/uL — ABNORMAL LOW (ref 3.80–5.20)
RDW: 15.8 % — AB (ref 11.5–14.5)
WBC: 7.4 10*3/uL (ref 3.6–11.0)

## 2016-09-26 LAB — GLUCOSE, CAPILLARY
Glucose-Capillary: 122 mg/dL — ABNORMAL HIGH (ref 65–99)
Glucose-Capillary: 150 mg/dL — ABNORMAL HIGH (ref 65–99)

## 2016-09-26 MED ORDER — SACCHAROMYCES BOULARDII 250 MG PO CAPS: 250.0000 mg | ORAL_CAPSULE | Freq: Two times a day (BID) | ORAL | 0 refills | Status: DC

## 2016-09-26 MED ORDER — SODIUM CHLORIDE 0.9 % IV SOLN
INTRAVENOUS | Status: DC
  Administered 2016-09-26: 09:00:00 via INTRAVENOUS

## 2016-09-26 MED ORDER — METRONIDAZOLE 500 MG PO TABS: 500.0000 mg | ORAL_TABLET | Freq: Three times a day (TID) | ORAL | 0 refills | Status: DC

## 2016-09-26 MED ORDER — ACETAMINOPHEN 650 MG RE SUPP: 650.0000 mg | Freq: Four times a day (QID) | RECTAL | Status: DC | PRN

## 2016-09-26 MED ORDER — ACETAMINOPHEN 325 MG PO TABS: 650.0000 mg | ORAL_TABLET | Freq: Four times a day (QID) | ORAL | Status: DC | PRN

## 2016-09-26 NOTE — Progress Notes (Signed)
Patient discharged. IV site removed. Prescription given. Concerns addressed. Daughter updated on care plan. Case manager made aware of home health order.

## 2016-09-26 NOTE — Care Management (Signed)
Patient to discharge home today.  Patient lives at home with with son.  Patient has walker, cane and shower seat in the home.  Patient is open with [facility] home health for RN and PT.  [place] with [facility] notified of admission.   PCP [place]cock.  Resumption orders have been placed for PT.  Requested RN as well.  [place] notified of discharge plan.  RNCM signing off.

## 2016-09-26 NOTE — Discharge Instructions (Signed)
Resume diet and activity as before ° ° °

## 2016-09-26 NOTE — Plan of Care (Signed)
Problem: Safety: Goal: Ability to remain free from injury will improve Outcome: Progressing Bed alarm on   

## 2016-09-27 NOTE — Discharge Summary (Signed)
[facility] [place] [facility] [place] NAME: Regina [name]    MR#:  [id number]  DATE OF BIRTH:  1933-09-01  DATE OF ADMISSION:  09/25/2016 ADMITTING PHYSICIAN: Hillary Bow, MD  DATE OF DISCHARGE: 09/26/2016  2:12 PM  PRIMARY CARE PHYSICIAN: Donnie Coffin, MD   ADMISSION DIAGNOSIS:  C. difficile colitis [A04.72]  DISCHARGE DIAGNOSIS:  Active Problems:   C. difficile colitis   Pressure injury of skin   SECONDARY DIAGNOSIS:   Past Medical History:  Diagnosis Date  . Breast cancer, left [facility]) 2013   Mastectomy.   . CAD (coronary artery disease)    s/p cath in 2009 with 2 stents  . Diabetes mellitus without complication ([place])   . GERD (gastroesophageal reflux disease)   . HLD (hyperlipidemia)   . Hypertension   . Hypothyroidism   . Paroxysmal atrial fibrillation (HCC)      ADMITTING HISTORY  HISTORY OF PRESENT ILLNESS:  Regina [name]  is a 83 y.o. female with a known history of Diabetes, hypertension, recent UTI on antibiotics and discharged home from rehabilitation 2 days back presents to the emergency room with 6 days of severe diarrhea which is watery. Mild abdominal cramping but no significant pain. Some nausea but no vomiting. Poor appetite. Afebrile. Patient in the emergency room has been found to be C. difficile positive along with acute kidney injury and hypotension and is being admitted to the hospitalist service. He has received 1 dose of IV vancomycin.  No prior history of C. difficile.   HOSPITAL COURSE:   * C diff diarrhea Enteric isolation. Started oral Flagyl. Diarrhea resolved quickly. No signs of dehydration by Tungate of discharge. Tolerating food. No abdominal pain. Afebrile. Normal WBC.  * Acute kidney injury due to dehydration and ATN from hypotension Resolved with IV fluids  * Hypokalemia Replaced orally. This was due to diarrhea.  * Diabetes mellitus Continue home dose of insulin.  * Hypertension No change in home  medication  Patient discharged home in stable condition with home health.  * DVT prophylaxis with Lovenox.  CONSULTS OBTAINED:    DRUG ALLERGIES:  No Known Allergies  DISCHARGE MEDICATIONS:   Discharge Medication List as of 09/26/2016  1:49 PM    START taking these medications   Details  metroNIDAZOLE (FLAGYL) 500 MG tablet Take 1 tablet (500 mg total) by mouth 3 (three) times daily., Starting Thu 09/26/2016, Print    saccharomyces boulardii (FLORASTOR) 250 MG capsule Take 1 capsule (250 mg total) by mouth 2 (two) times daily., Starting Thu 09/26/2016, Print      CONTINUE these medications which have NOT CHANGED   Details  acetaminophen (TYLENOL) 325 MG tablet Take 2 tablets (650 mg total) by mouth every 6 (six) hours as needed for mild pain (or Fever >/= 101)., Starting Tue 02/21/2015, OTC    bumetanide (BUMEX) 2 MG tablet Take 2 mg by mouth daily., Historical Med    clopidogrel (PLAVIX) 75 MG tablet Take 75 mg by mouth daily., Historical Med    esomeprazole (NEXIUM) 40 MG capsule Take 40 mg by mouth daily. , Historical Med    feeding supplement, GLUCERNA SHAKE, (GLUCERNA SHAKE) LIQD Take 237 mLs by mouth 3 (three) times daily between meals., Starting Mon 09/02/2016, Normal    ferrous sulfate 325 (65 FE) MG tablet Take 1 tablet (325 mg total) by mouth 2 (two) times daily with a meal., Starting Mon 07/01/2016, No Print    Fluticasone-Salmeterol (ADVAIR) 250-50 MCG/DOSE AEPB Inhale  1 puff into the lungs 2 (two) times daily., Historical Med    guaiFENesin (MUCINEX) 600 MG 12 hr tablet Take 1 tablet (600 mg total) by mouth 2 (two) times daily as needed., Starting Mon 09/02/2016, Normal    insulin aspart protamine- aspart (NOVOLOG MIX 70/30) (70-30) 100 UNIT/ML injection Inject 0.28 mLs (28 Units total) into the skin 2 (two) times daily with a meal., Starting Tue 02/21/2015, Print    ipratropium (ATROVENT) 0.03 % nasal spray Place 2 sprays into both nostrils 3 (three) times daily.,  Historical Med    ipratropium-albuterol (DUONEB) 0.5-2.5 (3) MG/3ML SOLN Take 3 mLs by nebulization every 6 (six) hours as needed., Starting Mon 09/02/2016, Normal    levothyroxine (SYNTHROID, LEVOTHROID) 50 MCG tablet Take 50 mcg by mouth daily., Historical Med    lisinopril (PRINIVIL,ZESTRIL) 20 MG tablet Take 20 mg by mouth daily., Starting Sun 08/25/2016, Historical Med    metoprolol tartrate (LOPRESSOR) 25 MG tablet Take 1 tablet (25 mg total) by mouth 2 (two) times daily., Starting Mon 07/01/2016, No Print    oxyCODONE-acetaminophen (PERCOCET/ROXICET) 5-325 MG tablet Take 1 tablet by mouth 2 (two) times daily as needed for severe pain., Historical Med    paricalcitol (ZEMPLAR) 1 MCG capsule Take 1 mcg by mouth 3 (three) times a week. Pt takes on Monday, Wednesday, and Friday., Historical Med    polyethylene glycol (MIRALAX / GLYCOLAX) packet Take 17 g by mouth daily as needed for mild constipation., Starting Mon 07/01/2016, No Print    ranolazine (RANEXA) 500 MG 12 hr tablet Take 500 mg by mouth 2 (two) times daily., Historical Med    rosuvastatin (CRESTOR) 10 MG tablet Take 10 mg by mouth daily., Historical Med      STOP taking these medications     furosemide (LASIX) 20 MG tablet         Today   VITAL SIGNS:  Blood pressure (!) 142/65, pulse 99, temperature 98 F (36.7 C), temperature source Oral, resp. rate 20, height 4\' 11"  (1.499 m), weight 101.6 kg (224 lb), SpO2 100 %.  I/O:  No intake or output data in the 24 hours ending 09/27/16 1413  PHYSICAL EXAMINATION:  Physical Exam  GENERAL:  83 y.o.-year-old patient lying in the bed with no acute distress.  LUNGS: Normal breath sounds bilaterally, no wheezing, rales,rhonchi or crepitation. No use of accessory muscles of respiration.  CARDIOVASCULAR: S1, S2 normal. No murmurs, rubs, or gallops.  ABDOMEN: Soft, non-tender, non-distended. Bowel sounds present. No organomegaly or mass.  NEUROLOGIC: Moves all 4  extremities. PSYCHIATRIC: The patient is alert and oriented x 3.  SKIN: No obvious rash, lesion, or ulcer.   DATA REVIEW:   CBC  Recent Labs Lab 09/26/16 0635  WBC 7.4  HGB 9.8*  HCT 29.4*  PLT 180    Chemistries   Recent Labs Lab 09/26/16 0635  NA 136  K 5.1  CL 108  CO2 21*  GLUCOSE 144*  BUN 33*  CREATININE 1.71*  CALCIUM 7.8*    Cardiac Enzymes No results for input(s): TROPONINI in the last 168 hours.  Microbiology Results  Results for orders placed or performed during the hospital encounter of 09/25/16  C difficile quick scan w PCR reflex     Status: Abnormal   Collection Time: 09/25/16  2:24 AM  Result Value Ref Range Status   C Diff antigen NEGATIVE NEGATIVE Final   C Diff toxin POSITIVE (A) NEGATIVE Final   C Diff interpretation Results are indeterminate. See  PCR results.  Final  Gastrointestinal Panel by PCR , Stool     Status: None   Collection Time: 09/25/16  2:24 AM  Result Value Ref Range Status   Campylobacter species NOT DETECTED NOT DETECTED Final   Plesimonas shigelloides NOT DETECTED NOT DETECTED Final   Salmonella species NOT DETECTED NOT DETECTED Final   Yersinia enterocolitica NOT DETECTED NOT DETECTED Final   Vibrio species NOT DETECTED NOT DETECTED Final   Vibrio cholerae NOT DETECTED NOT DETECTED Final   Enteroaggregative E coli (EAEC) NOT DETECTED NOT DETECTED Final   Enteropathogenic E coli (EPEC) NOT DETECTED NOT DETECTED Final   Enterotoxigenic E coli (ETEC) NOT DETECTED NOT DETECTED Final   Shiga like toxin producing E coli (STEC) NOT DETECTED NOT DETECTED Final   Shigella/Enteroinvasive E coli (EIEC) NOT DETECTED NOT DETECTED Final   Cryptosporidium NOT DETECTED NOT DETECTED Final   Cyclospora cayetanensis NOT DETECTED NOT DETECTED Final   Entamoeba histolytica NOT DETECTED NOT DETECTED Final   Giardia lamblia NOT DETECTED NOT DETECTED Final   Adenovirus F40/41 NOT DETECTED NOT DETECTED Final   Astrovirus NOT DETECTED NOT  DETECTED Final   Norovirus GI/GII NOT DETECTED NOT DETECTED Final   Rotavirus A NOT DETECTED NOT DETECTED Final   Sapovirus (I, II, IV, and V) NOT DETECTED NOT DETECTED Final  Clostridium Difficile by PCR     Status: Abnormal   Collection Time: 09/25/16  2:24 AM  Result Value Ref Range Status   Toxigenic C Difficile by pcr POSITIVE (A) NEGATIVE Final    Comment: Positive for toxigenic C. difficile with little to no toxin production. Only treat if clinical presentation suggests symptomatic illness.    RADIOLOGY:  No results found.  Follow up with PCP in 1 week.  Management plans discussed with the patient, family and they are in agreement.  CODE STATUS:  Code Status History    Date Active Date Inactive Code Status Order ID Comments User Context   09/25/2016  7:49 AM 09/26/2016  5:17 PM Full Code [id number]  Hillary Bow, MD ED   08/28/2016  3:10 PM 09/02/2016  5:53 PM Full Code [id number]  Henreitta Leber, MD Inpatient   06/26/2016 10:43 PM 07/01/2016  4:45 PM Full Code [id number]  Lance Coon, MD Inpatient   02/20/2015  3:53 AM 02/21/2015  5:11 PM Full Code [id number]  Lance Coon, MD Inpatient      TOTAL TIME TAKING CARE OF THIS PATIENT ON [place] OF DISCHARGE: more than 30 minutes.   Hillary Bow R M.D on 09/27/2016 at 2:13 PM  Between 7am to 6pm - Pager - [phone]  After 6pm go to [email or web address] - password [email or web address]  Office  [phone]  CC: Primary care physician; Donnie Coffin, MD  Note: This dictation was prepared with Dragon dictation along with smaller phrase technology. Any transcriptional errors that result from this process are unintentional.

## 2017-09-25 ENCOUNTER — Other Ambulatory Visit: Payer: Self-pay | Admitting: Family Medicine

## 2017-09-25 DIAGNOSIS — Z1239 Encounter for other screening for malignant neoplasm of breast: Secondary | ICD-10-CM

## 2017-09-25 DIAGNOSIS — Z780 Asymptomatic menopausal state: Secondary | ICD-10-CM

## 2017-10-21 ENCOUNTER — Other Ambulatory Visit: Payer: Medicare Other

## 2017-10-22 ENCOUNTER — Ambulatory Visit
Admission: RE | Admit: 2017-10-22 | Discharge: 2017-10-22 | Disposition: A | Discharge status: Home / Self Care | Payer: Medicare Other | Source: Ambulatory Visit | Attending: Family Medicine | Admitting: Family Medicine

## 2017-10-22 DIAGNOSIS — M8589 Other specified disorders of bone density and structure, multiple sites: Secondary | ICD-10-CM | POA: Diagnosis not present

## 2017-10-22 DIAGNOSIS — Z1231 Encounter for screening mammogram for malignant neoplasm of breast: Secondary | ICD-10-CM | POA: Diagnosis present

## 2017-10-22 DIAGNOSIS — Z780 Asymptomatic menopausal state: Secondary | ICD-10-CM

## 2017-10-22 DIAGNOSIS — Z1239 Encounter for other screening for malignant neoplasm of breast: Secondary | ICD-10-CM

## 2018-03-03 ENCOUNTER — Encounter: Payer: Self-pay | Admitting: Gastroenterology

## 2018-03-03 ENCOUNTER — Ambulatory Visit (INDEPENDENT_AMBULATORY_CARE_PROVIDER_SITE_OTHER): Payer: Medicare Other | Admitting: Gastroenterology

## 2018-03-03 VITALS — BP 133/53 | HR 109 | Ht 59.0 in | Wt 223.0 lb

## 2018-03-03 DIAGNOSIS — R197 Diarrhea, unspecified: Secondary | ICD-10-CM

## 2018-03-03 DIAGNOSIS — R152 Fecal urgency: Secondary | ICD-10-CM

## 2018-03-03 DIAGNOSIS — R159 Full incontinence of feces: Secondary | ICD-10-CM

## 2018-03-04 NOTE — Progress Notes (Signed)
Regina [name] [place], [place] [address]  Main: [phone]  Fax: [phone]   Gastroenterology Consultation  Referring Provider:     Donnie Coffin, MD Primary Care Physician:  Donnie Coffin, MD Primary Gastroenterologist:  Dr. Vonda [name] Reason for Consultation:     History of C. difficile        HPI:    Chief Complaint  Patient presents with  . Diarrhea    referral from Dr. Tillman Abide Regina [name] is a 84 y.o. y/o female referred for consultation & management  by Dr. Clide [name], Regina Lynch, MD.  Patient with history of C. difficile in January 2018, with positive antigen and toxin at the time.  Also was found to have flu and a UTI during that admission.  Was treated with oral vancomycin on that admission.  Was admitted again a month later, status post fall, and reportedly had diarrhea at the time, and C. difficile antigen was found to be positive during that admission, toxin negative, PCR positive.  Was given oral Flagyl.  Reports symptoms had resolved after that.  However, now reports urgency with bowel movements, and episodes of incontinence due to this.  Walks with a walker, and when she feels a bowel movement coming, she is not able to make it to the bathroom on time at times.  However, only having about 1 bowel movement daily.  No blood in stool.  Takes Imodium intermittently which helps.  Past Medical History:  Diagnosis Date  . Breast cancer, left [facility]) 2013   Mastectomy.   . CAD (coronary artery disease)    s/p cath in 2009 with 2 stents  . Diabetes mellitus without complication ([place])   . GERD (gastroesophageal reflux disease)   . HLD (hyperlipidemia)   . Hypertension   . Hypothyroidism   . Paroxysmal atrial fibrillation [facility])     Past Surgical History:  Procedure Laterality Date  . BREAST LUMPECTOMY    . CHOLECYSTECTOMY    . MASTECTOMY Left     Prior to Admission medications   Medication Sig Start Date End Date  Taking? Authorizing Provider  insulin aspart protamine- aspart (NOVOLOG MIX 70/30) (70-30) 100 UNIT/ML injection Inject 0.28 mLs (28 Units total) into the skin 2 (two) times daily with a meal. 02/21/15  Yes Gouru, Aruna, MD  acetaminophen (TYLENOL) 325 MG tablet Take 2 tablets (650 mg total) by mouth every 6 (six) hours as needed for mild pain (or Fever >/= 101). 02/21/15   Gouru, Aruna, MD  bumetanide (BUMEX) 2 MG tablet Take 2 mg by mouth daily.    [provider]  clopidogrel (PLAVIX) 75 MG tablet Take 75 mg by mouth daily.    [provider]  esomeprazole (NEXIUM) 40 MG capsule Take 40 mg by mouth daily.     [provider]  feeding supplement, GLUCERNA SHAKE, (GLUCERNA SHAKE) LIQD Take 237 mLs by mouth 3 (three) times daily between meals. 09/02/16   Regina Mandes, MD  ferrous sulfate 325 (65 FE) MG tablet Take 1 tablet (325 mg total) by mouth 2 (two) times daily with a meal. Patient not taking: Reported on 03/03/2018 07/01/16   Demetrios Loll, MD  Fluticasone-Salmeterol (ADVAIR) 250-50 MCG/DOSE AEPB Inhale 1 puff into the lungs 2 (two) times daily.    [provider]  guaiFENesin (MUCINEX) 600 MG 12 hr tablet Take 1 tablet (600 mg total) by mouth 2 (two) times daily as needed. Patient  not taking: Reported on 03/03/2018 09/02/16   Regina Mandes, MD  ipratropium (ATROVENT) 0.03 % nasal spray Place 2 sprays into both nostrils 3 (three) times daily.    [provider]  ipratropium-albuterol (DUONEB) 0.5-2.5 (3) MG/3ML SOLN Take 3 mLs by nebulization every 6 (six) hours as needed. 09/02/16   Regina Mandes, MD  levothyroxine (SYNTHROID, LEVOTHROID) 50 MCG tablet Take 50 mcg by mouth daily.    [provider]  lisinopril (PRINIVIL,ZESTRIL) 20 MG tablet Take 20 mg by mouth daily. 08/25/16   [provider]  metoprolol tartrate (LOPRESSOR) 25 MG tablet Take 1 tablet (25 mg total) by mouth 2 (two) times daily. Patient taking differently: Take 50 mg by mouth 2  (two) times daily.  07/01/16   Demetrios Loll, MD  metroNIDAZOLE (FLAGYL) 500 MG tablet Take 1 tablet (500 mg total) by mouth 3 (three) times daily. Patient not taking: Reported on 03/03/2018 09/26/16   Hillary Bow, MD  oxyCODONE-acetaminophen (PERCOCET/ROXICET) 5-325 MG tablet Take 1 tablet by mouth 2 (two) times daily as needed for severe pain.    [provider]  paricalcitol (ZEMPLAR) 1 MCG capsule Take 1 mcg by mouth 3 (three) times a week. Pt takes on Monday, Wednesday, and Friday.    [provider]  polyethylene glycol (MIRALAX / GLYCOLAX) packet Take 17 g by mouth daily as needed for mild constipation. 07/01/16   Demetrios Loll, MD  ranolazine (RANEXA) 500 MG 12 hr tablet Take 500 mg by mouth 2 (two) times daily.    [provider]  rosuvastatin (CRESTOR) 10 MG tablet Take 10 mg by mouth daily.    [provider]  saccharomyces boulardii (FLORASTOR) 250 MG capsule Take 1 capsule (250 mg total) by mouth 2 (two) times daily. Patient not taking: Reported on 03/03/2018 09/26/16   Hillary Bow, MD    Family History  Problem Relation Age of Onset  . Kidney failure Mother   . Lung cancer Father      Social History   Tobacco Use  . Smoking status: Never Smoker  . Smokeless tobacco: Never Used  Substance Use Topics  . Alcohol use: No    Alcohol/week: 0.0 oz  . Drug use: No    Allergies as of 03/03/2018  . (No Known Allergies)    Review of Systems:    All systems reviewed and negative except where noted in HPI.   Physical Exam:  BP (!) 133/53   Pulse (!) 109   Ht 4\' 11"  (1.499 m)   Wt 223 lb (101.2 kg)   BMI 45.04 kg/m  No LMP recorded. Patient is postmenopausal. Psych:  Alert and cooperative. Normal mood and affect. General:   Alert,  Well-developed, well-nourished, pleasant and cooperative in NAD Head:  Normocephalic and atraumatic. Eyes:  Sclera clear, no icterus.   Conjunctiva pink. Ears:  Normal auditory acuity. Nose:  No deformity,  discharge, or lesions. Mouth:  No deformity or lesions,oropharynx pink & moist. Neck:  Supple; no masses or thyromegaly. Lungs:  Respirations even and unlabored.  Clear throughout to auscultation.   No wheezes, crackles, or rhonchi. No acute distress. Heart:  Regular rate and rhythm; no murmurs, clicks, rubs, or gallops. Abdomen:  Normal bowel sounds.  No bruits.  Soft, non-tender and non-distended without masses, hepatosplenomegaly or hernias noted.  No guarding or rebound tenderness.    Msk:  Symmetrical without gross deformities. Good, equal movement & strength bilaterally. Pulses:  Normal pulses noted. Extremities:  No clubbing or edema.  No cyanosis. Neurologic:  Alert and oriented x3;  grossly normal neurologically. Skin:  Intact without significant lesions or rashes. No jaundice. Lymph Nodes:  No significant cervical adenopathy. Psych:  Alert and cooperative. Normal mood and affect.   Labs: CBC    Component Value Date/Time   WBC 7.4 09/26/2016 0635   RBC 3.34 (L) 09/26/2016 0635   HGB 9.8 (L) 09/26/2016 0635   HGB 10.1 (L) 07/05/2012 0447   HCT 29.4 (L) 09/26/2016 0635   HCT 30.1 (L) 07/05/2012 0447   PLT 180 09/26/2016 0635   PLT 213 07/05/2012 0447   MCV 87.9 09/26/2016 0635   MCV 87 07/05/2012 0447   MCH 29.4 09/26/2016 0635   MCHC 33.4 09/26/2016 0635   RDW 15.8 (H) 09/26/2016 0635   RDW 13.9 07/05/2012 0447   LYMPHSABS 0.3 (L) 08/28/2016 1214   LYMPHSABS 1.5 07/05/2012 0447   MONOABS 0.5 08/28/2016 1214   MONOABS 0.6 07/05/2012 0447   EOSABS 0.0 08/28/2016 1214   EOSABS 0.1 07/05/2012 0447   BASOSABS 0.0 08/28/2016 1214   BASOSABS 0.1 07/05/2012 0447   CMP     Component Value Date/Time   NA 136 09/26/2016 0635   NA 140 02/03/2014 1149   K 5.1 09/26/2016 0635   K 3.4 (L) 02/03/2014 1149   CL 108 09/26/2016 0635   CL 104 02/03/2014 1149   CO2 21 (L) 09/26/2016 0635   CO2 28 02/03/2014 1149   GLUCOSE 144 (H) 09/26/2016 0635   GLUCOSE 84 02/03/2014 1149     BUN 33 (H) 09/26/2016 0635   BUN 18 02/03/2014 1149   CREATININE 1.71 (H) 09/26/2016 0635   CREATININE 1.35 (H) 02/03/2014 1149   CALCIUM 7.8 (L) 09/26/2016 0635   CALCIUM 9.1 02/03/2014 1149   PROT 6.2 (L) 08/28/2016 0748   PROT 6.7 02/03/2014 1149   ALBUMIN 2.8 (L) 08/28/2016 0748   ALBUMIN 3.3 (L) 02/03/2014 1149   AST 26 08/28/2016 0748   AST 25 02/03/2014 1149   ALT 11 (L) 08/28/2016 0748   ALT 21 02/03/2014 1149   ALKPHOS 51 08/28/2016 0748   ALKPHOS 80 02/03/2014 1149   BILITOT 0.5 08/28/2016 0748   BILITOT 0.4 02/03/2014 1149   GFRNONAA 27 (L) 09/26/2016 0635   GFRNONAA 38 (L) 02/03/2014 1149   GFRAA 31 (L) 09/26/2016 0635   GFRAA 43 (L) 02/03/2014 1149    Imaging Studies: No results found.  Assessment and Plan:   Ladye Macnaughton Knoble is a 84 y.o. y/o female has been referred for history of chronic diarrhea, history of C. difficile, with fecal incontinence intermittently  Patient's current symptoms are most consistent with fecal incontinence likely due to pelvic floor dysfunction She describes her bowel movements as one formed to loose bowel movement daily without blood.  Her main complaint is urgency when she needs to have a bowel movement, and subsequent incontinence if she does not get to the bathroom in time.  She has not had more frequent stools recently or changes in her bowel movements recently We will check GI panel and C. difficile If this is negative, her symptoms are likely due to pelvic floor dysfunction No indication for colonoscopy  If stool testing is negative, she may benefit from Imodium Pelvic floor physical therapy can also be considered Would recommend primary care physician to review her chronic medications, and discontinue any unnecessary medications Nexium is noted to be a previous medication for her, not current.  PPI can cause diarrhea but she is not on  them MiraLAX is also reported to be a previous medication for her and on current  Dr  Regina [name]

## 2018-03-10 ENCOUNTER — Other Ambulatory Visit
Admission: RE | Admit: 2018-03-10 | Discharge: 2018-03-10 | Disposition: A | Discharge status: Home / Self Care | Payer: Medicare Other | Source: Ambulatory Visit | Attending: Gastroenterology | Admitting: Gastroenterology

## 2018-03-10 DIAGNOSIS — R159 Full incontinence of feces: Secondary | ICD-10-CM | POA: Insufficient documentation

## 2018-03-10 DIAGNOSIS — R197 Diarrhea, unspecified: Secondary | ICD-10-CM | POA: Insufficient documentation

## 2018-03-10 LAB — C DIFFICILE QUICK SCREEN W PCR REFLEX
C DIFFICILE (CDIFF) INTERP: NOT DETECTED
C Diff antigen: NEGATIVE
C Diff toxin: NEGATIVE

## 2018-03-10 LAB — GASTROINTESTINAL PANEL BY PCR, STOOL (REPLACES STOOL CULTURE)
ADENOVIRUS F40/41: NOT DETECTED
Astrovirus: NOT DETECTED
CAMPYLOBACTER SPECIES: NOT DETECTED
CYCLOSPORA CAYETANENSIS: NOT DETECTED
Cryptosporidium: NOT DETECTED
ENTEROPATHOGENIC E COLI (EPEC): NOT DETECTED
Entamoeba histolytica: NOT DETECTED
Enteroaggregative E coli (EAEC): NOT DETECTED
Enterotoxigenic E coli (ETEC): NOT DETECTED
Giardia lamblia: NOT DETECTED
Norovirus GI/GII: NOT DETECTED
Plesimonas shigelloides: NOT DETECTED
ROTAVIRUS A: NOT DETECTED
SALMONELLA SPECIES: NOT DETECTED
SAPOVIRUS (I, II, IV, AND V): NOT DETECTED
SHIGA LIKE TOXIN PRODUCING E COLI (STEC): NOT DETECTED
SHIGELLA/ENTEROINVASIVE E COLI (EIEC): NOT DETECTED
VIBRIO SPECIES: NOT DETECTED
Vibrio cholerae: NOT DETECTED
Yersinia enterocolitica: NOT DETECTED

## 2018-03-19 ENCOUNTER — Telehealth: Payer: Self-pay | Admitting: Gastroenterology

## 2018-03-19 NOTE — Telephone Encounter (Signed)
Pt left vm she states she just spoke with someone and has another question please call pt

## 2018-03-19 NOTE — Telephone Encounter (Signed)
Pt has a real concern about having cancer because she has a history of breast cancer. Last colonoscopy 2014 by Dr. Allen Norris and CT scan in 08/2016 of which neither mentioned any cancer. Pt reassured but she states she knows something is wrong and wants to know what she can do.

## 2018-03-31 NOTE — Telephone Encounter (Signed)
Left message that Dr. Bonna Gains would like to talk with her and address her concerns and to contact office for an appt.

## 2018-04-09 ENCOUNTER — Ambulatory Visit (INDEPENDENT_AMBULATORY_CARE_PROVIDER_SITE_OTHER): Payer: Medicare Other | Admitting: Gastroenterology

## 2018-04-09 ENCOUNTER — Encounter: Payer: Self-pay | Admitting: Gastroenterology

## 2018-04-09 VITALS — BP 102/69 | HR 103 | Ht 59.0 in | Wt 221.8 lb

## 2018-04-09 DIAGNOSIS — K529 Noninfective gastroenteritis and colitis, unspecified: Secondary | ICD-10-CM

## 2018-04-09 DIAGNOSIS — M6289 Other specified disorders of muscle: Secondary | ICD-10-CM

## 2018-04-09 MED ORDER — LOPERAMIDE HCL 2 MG PO TABS: 2.0000 mg | ORAL_TABLET | ORAL | 0 refills | Status: DC | PRN

## 2018-04-09 MED ORDER — PSYLLIUM 28.3 % PO POWD: 28.3000 | Freq: Every day | ORAL | 3 refills | Status: DC

## 2018-04-09 NOTE — Progress Notes (Signed)
Regina Antigua, MD [employer]  [place], [place] [address]  Main: [phone]  Fax: [phone]   Primary Care Physician: Donnie Coffin, MD  Primary Gastroenterologist:  Dr. Vonda [name]  Chief complaint: Diarrhea  HPI: Regina [name] is a 84 y.o. female here for follow-up of chronic diarrhea.  Has previous colonoscopy with biopsies for microscopic colitis being negative.  Has history of C. difficile in January 2018.  Most recent infectious work-up in July 2019 was negative for infections including C. difficile.  Last colonoscopy, 2014 by Dr. Allen Norris for chronic diarrhea, with normal terminal ileum and normal colon reported.  Biopsies obtained and did not show any microscopic colitis.  EGD in 2013 for diarrhea and anemia, with hiatal hernia, nodule in the stomach seen and biopsied showed fundic gland polyp.  Duodenal biopsies also done and were negative for changes of celiac disease.  Colonoscopy 2012 for chronic diarrhea, with internal hemorrhoids seen.  Biopsies done.  Biopsies negative for microscopic colitis.  EGD 2009 for the chest pain, showed grade a esophagitis antral erythema.  On previous visit in July 2019, symptoms were thought to be likely due to pelvic floor dysfunction.  Now patient reports there is some days she does not have any bowel movements and then takes MiraLAX, followed by days of loose bowel movements, and incontinence.  Also states that times when she urinates she has a bowel movement that she did not feel coming.  States her incontinence occurs that she is not able to make it to the bathroom in time when she feels a bowel movement coming.  No blood in stool no, no weight loss, no abdominal pain.  Current Outpatient Medications  Medication Sig Dispense Refill  . acetaminophen (TYLENOL) 325 MG tablet Take 2 tablets (650 mg total) by mouth every 6 (six) hours as needed for mild pain (or Fever >/= 101).    . bumetanide (BUMEX) 2 MG  tablet Take 2 mg by mouth daily.    . clopidogrel (PLAVIX) 75 MG tablet Take 75 mg by mouth daily.    Marland Kitchen esomeprazole (NEXIUM) 40 MG capsule Take 40 mg by mouth daily.     . feeding supplement, GLUCERNA SHAKE, (GLUCERNA SHAKE) LIQD Take 237 mLs by mouth 3 (three) times daily between meals. 30 Can 0  . ferrous sulfate 325 (65 FE) MG tablet Take 1 tablet (325 mg total) by mouth 2 (two) times daily with a meal. (Patient not taking: Reported on 03/03/2018)  3  . Fluticasone-Salmeterol (ADVAIR) 250-50 MCG/DOSE AEPB Inhale 1 puff into the lungs 2 (two) times daily.    Marland Kitchen guaiFENesin (MUCINEX) 600 MG 12 hr tablet Take 1 tablet (600 mg total) by mouth 2 (two) times daily as needed. (Patient not taking: Reported on 03/03/2018) 30 tablet 0  . insulin aspart protamine- aspart (NOVOLOG MIX 70/30) (70-30) 100 UNIT/ML injection Inject 0.28 mLs (28 Units total) into the skin 2 (two) times daily with a meal. 10 mL 11  . ipratropium (ATROVENT) 0.03 % nasal spray Place 2 sprays into both nostrils 3 (three) times daily.    Marland Kitchen ipratropium-albuterol (DUONEB) 0.5-2.5 (3) MG/3ML SOLN Take 3 mLs by nebulization every 6 (six) hours as needed. 360 mL 0  . levothyroxine (SYNTHROID, LEVOTHROID) 50 MCG tablet Take 50 mcg by mouth daily.    Marland Kitchen lisinopril (PRINIVIL,ZESTRIL) 20 MG tablet Take 20 mg by mouth daily.    . metoprolol tartrate (LOPRESSOR) 25 MG tablet Take 1 tablet (25 mg total)  by mouth 2 (two) times daily. (Patient taking differently: Take 50 mg by mouth 2 (two) times daily. )    . metroNIDAZOLE (FLAGYL) 500 MG tablet Take 1 tablet (500 mg total) by mouth 3 (three) times daily. (Patient not taking: Reported on 03/03/2018) 24 tablet 0  . oxyCODONE-acetaminophen (PERCOCET/ROXICET) 5-325 MG tablet Take 1 tablet by mouth 2 (two) times daily as needed for severe pain.    . paricalcitol (ZEMPLAR) 1 MCG capsule Take 1 mcg by mouth 3 (three) times a week. Pt takes on Monday, Wednesday, and Friday.    . polyethylene glycol (MIRALAX /  GLYCOLAX) packet Take 17 g by mouth daily as needed for mild constipation. 14 each 0  . ranolazine (RANEXA) 500 MG 12 hr tablet Take 500 mg by mouth 2 (two) times daily.    . rosuvastatin (CRESTOR) 10 MG tablet Take 10 mg by mouth daily.    Marland Kitchen saccharomyces boulardii (FLORASTOR) 250 MG capsule Take 1 capsule (250 mg total) by mouth 2 (two) times daily. (Patient not taking: Reported on 03/03/2018) 20 capsule 0   No current facility-administered medications for this visit.     Allergies as of 04/09/2018  . (No Known Allergies)    ROS:  General: Negative for anorexia, weight loss, fever, chills, fatigue, weakness. ENT: Negative for hoarseness, difficulty swallowing , nasal congestion. CV: Negative for chest pain, angina, palpitations, dyspnea on exertion, peripheral edema.  Respiratory: Negative for dyspnea at rest, dyspnea on exertion, cough, sputum, wheezing.  GI: See history of present illness. GU:  Negative for dysuria, hematuria, urinary incontinence, urinary frequency, nocturnal urination.  Endo: Negative for unusual weight change.    Physical Examination:  Vitals:   04/09/18 1313  BP: 102/69  Pulse: (!) 103  Weight: 221 lb 12.8 oz (100.6 kg)  Height: 4\' 11"  (1.499 m)    General: Well-nourished, well-developed in no acute distress.  Eyes: No icterus. Conjunctivae pink. Mouth: Oropharyngeal mucosa moist and pink , no lesions erythema or exudate. Neck: Supple, Trachea midline Abdomen: Bowel sounds are normal, nontender, nondistended, no hepatosplenomegaly or masses, no abdominal bruits or hernia , no rebound or guarding.   Extremities: No lower extremity edema. No clubbing or deformities. Neuro: Alert and oriented x 3.  Grossly intact. Skin: Warm and dry, no jaundice.   Psych: Alert and cooperative, normal mood and affect.   Labs: CMP     Component Value Date/Time   NA 136 09/26/2016 0635   NA 140 02/03/2014 1149   K 5.1 09/26/2016 0635   K 3.4 (L) 02/03/2014 1149    CL 108 09/26/2016 0635   CL 104 02/03/2014 1149   CO2 21 (L) 09/26/2016 0635   CO2 28 02/03/2014 1149   GLUCOSE 144 (H) 09/26/2016 0635   GLUCOSE 84 02/03/2014 1149   BUN 33 (H) 09/26/2016 0635   BUN 18 02/03/2014 1149   CREATININE 1.71 (H) 09/26/2016 0635   CREATININE 1.35 (H) 02/03/2014 1149   CALCIUM 7.8 (L) 09/26/2016 0635   CALCIUM 9.1 02/03/2014 1149   PROT 6.2 (L) 08/28/2016 0748   PROT 6.7 02/03/2014 1149   ALBUMIN 2.8 (L) 08/28/2016 0748   ALBUMIN 3.3 (L) 02/03/2014 1149   AST 26 08/28/2016 0748   AST 25 02/03/2014 1149   ALT 11 (L) 08/28/2016 0748   ALT 21 02/03/2014 1149   ALKPHOS 51 08/28/2016 0748   ALKPHOS 80 02/03/2014 1149   BILITOT 0.5 08/28/2016 0748   BILITOT 0.4 02/03/2014 1149   GFRNONAA 27 (L) 09/26/2016  [place] (L) 02/03/2014 1149   GFRAA 31 (L) 09/26/2016 0635   GFRAA 43 (L) 02/03/2014 1149   Lab Results  Component Value Date   WBC 7.4 09/26/2016   HGB 9.8 (L) 09/26/2016   HCT 29.4 (L) 09/26/2016   MCV 87.9 09/26/2016   PLT 180 09/26/2016    Imaging Studies: No results found.  Assessment and Plan:   Jorgia Manthei Hodgkiss is a 84 y.o. y/o female here for follow-up of chronic diarrhea  Patient has completed previous work-up for chronic diarrhea including colonoscopy, most recently 5 years ago  No signs of microscopic colitis on biopsies Recent infectious work-up negative  No alarm symptoms present Symptoms likely due to pelvic floor dysfunction We discussed pelvic floor physical therapy and patient does not want to be referred for this She states she does not want to go to the pelvic floor physical therapy facility Family present with the patient, and understands that this might help the patient has well, but agree with the patient's wishes  I have asked her to stop the MiraLAX, I have asked her to start Metamucil to help blood glucose stool I have asked her to take it once daily in the morning for 7 days, and then decrease it to every  other [place] if she starts having more formed bowel movements after that.  I have asked her to use Imodium only twice a week as needed as back-up if Metamucil does not lead to to relief of her loose stools  Her chronic diarrhea is not really diarrhea, but is more pelvic floor dysfunction, not allowing her to feel her bowel movements coming and does have incontinence.  No indication for repeat infectious work-up at this time, given no increase in bowel movements at this time.  No indication for endoscopy at this time given chronic symptoms and no change in acute symptoms  Dr Regina [name]

## 2018-04-09 NOTE — Addendum Note (Signed)
Addended by: Earl Lagos on: 04/09/2018 05:11 PM   Modules accepted: Orders

## 2018-10-27 IMAGING — US US EXTREM LOW VENOUS*R*
1 series · 14 of 24 positions shown · non-contrast
Comparison: None.

CLINICAL DATA: Right leg pain for 2-3 weeks.

EXAM:
RIGHT LOWER EXTREMITY VENOUS DOPPLER ULTRASOUND
TECHNIQUE: Gray-scale sonography with graded compression, as well as color
Doppler and duplex ultrasound, were performed to evaluate the deep
venous system from the level of the common femoral vein through the
popliteal and proximal calf veins. Spectral Doppler was utilized to
evaluate flow at rest and with distal augmentation maneuvers.

[Series 1: us extrem low venous*right* · 0.08mm/px · 14 of 34 slices shown]
[im 1/34]
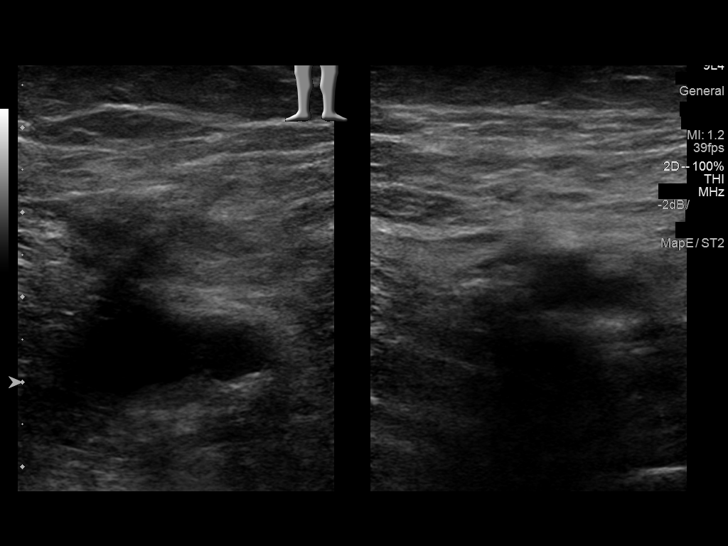
[im 3/34]
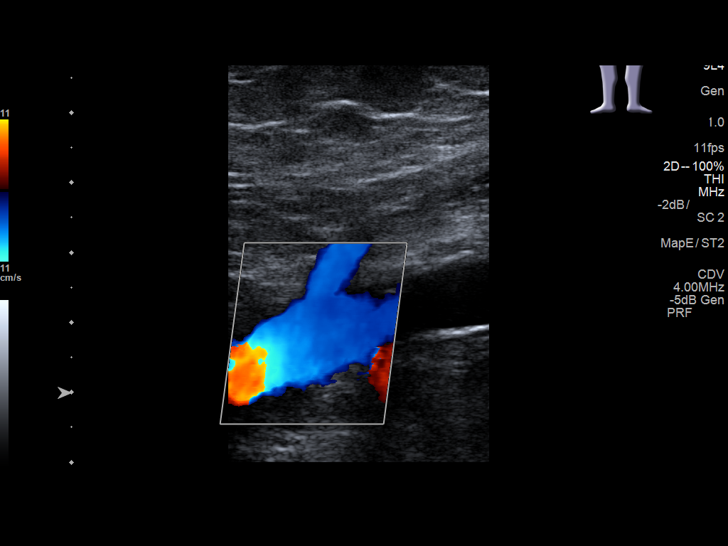
[im 6/34]
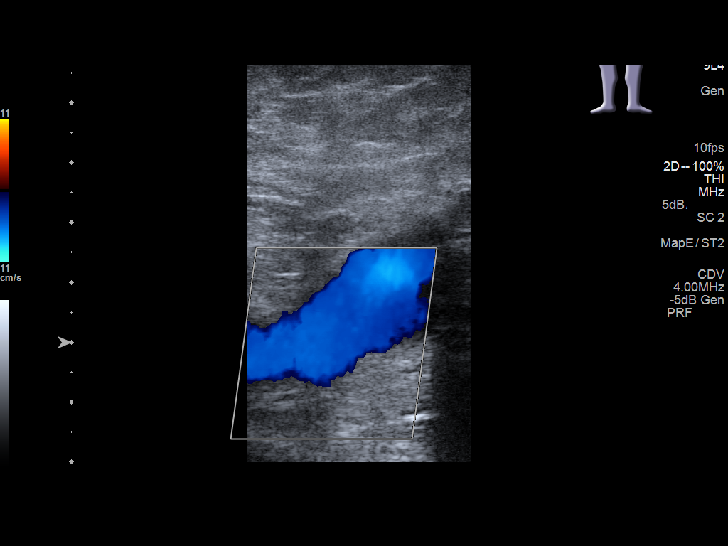
[im 9/34]
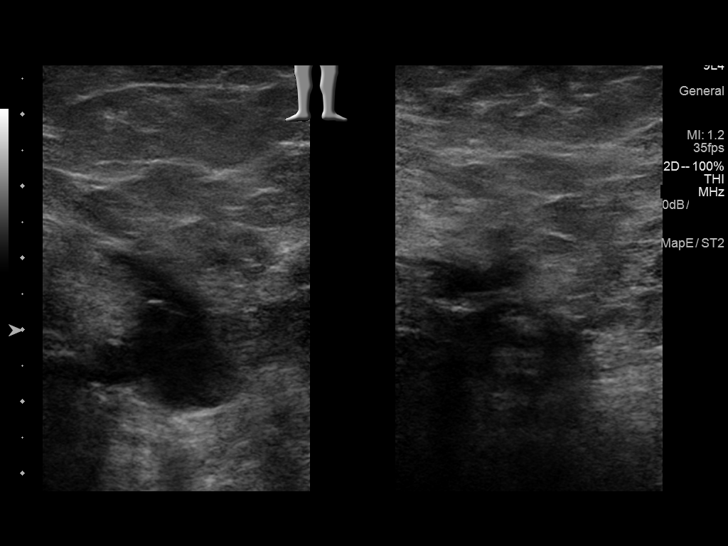
[im 11/34]
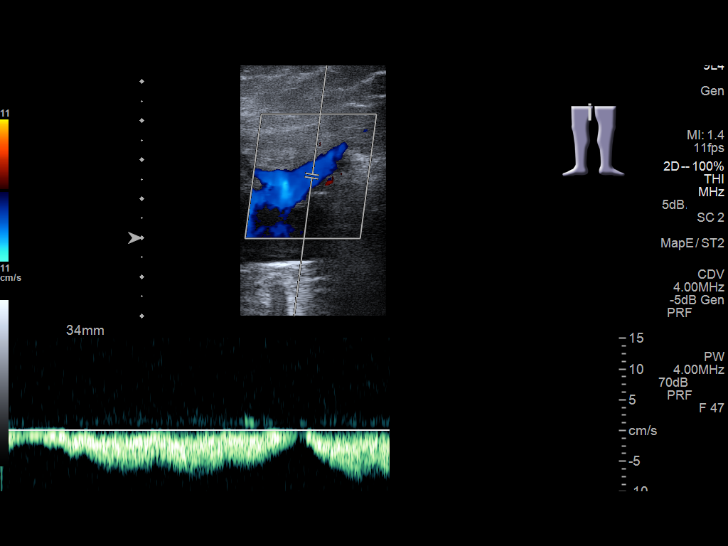
[im 13/34]
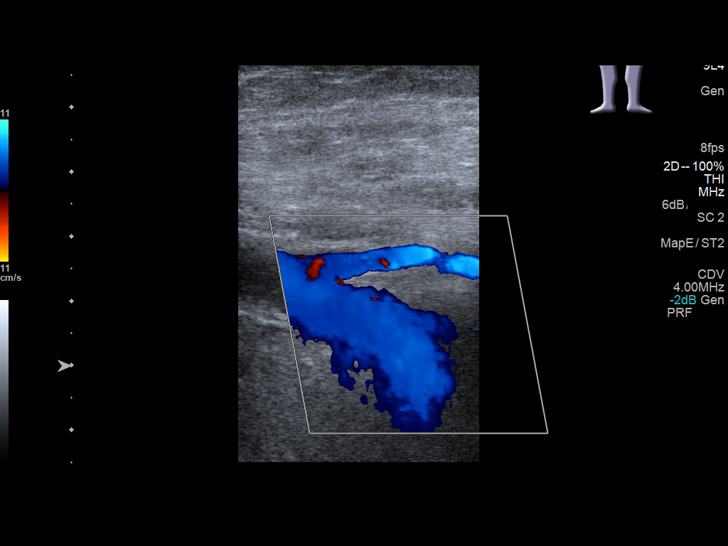
[im 16/34]
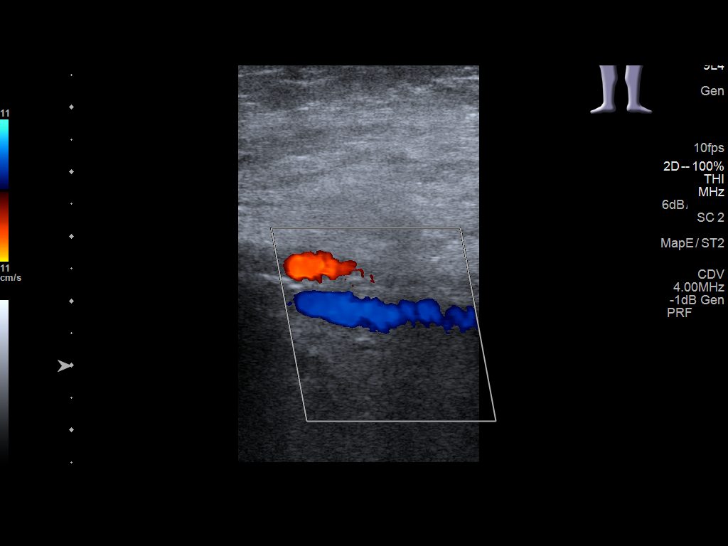
[im 18/34]
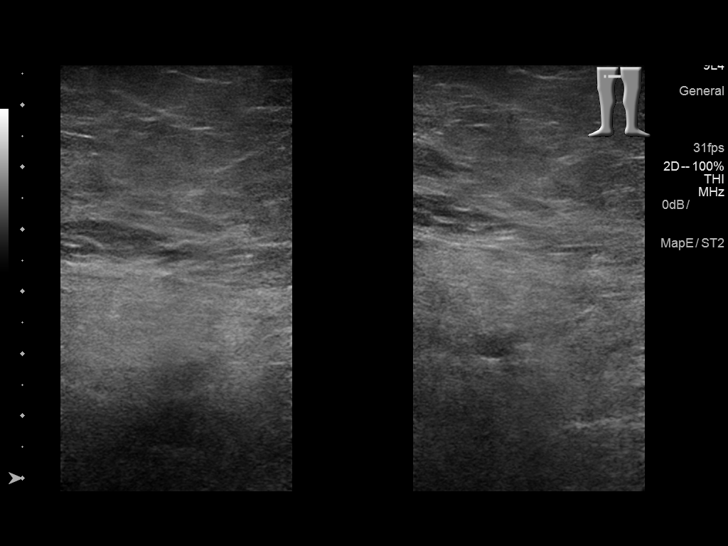
[im 21/34]
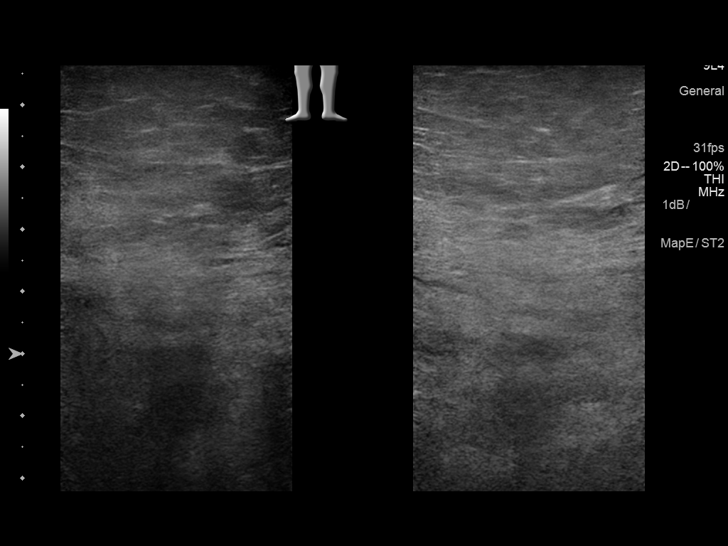
[im 23/34]
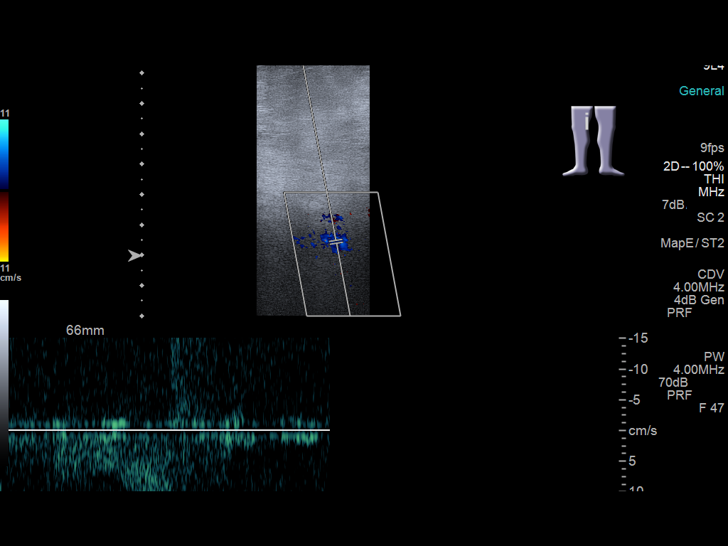
[im 26/34]
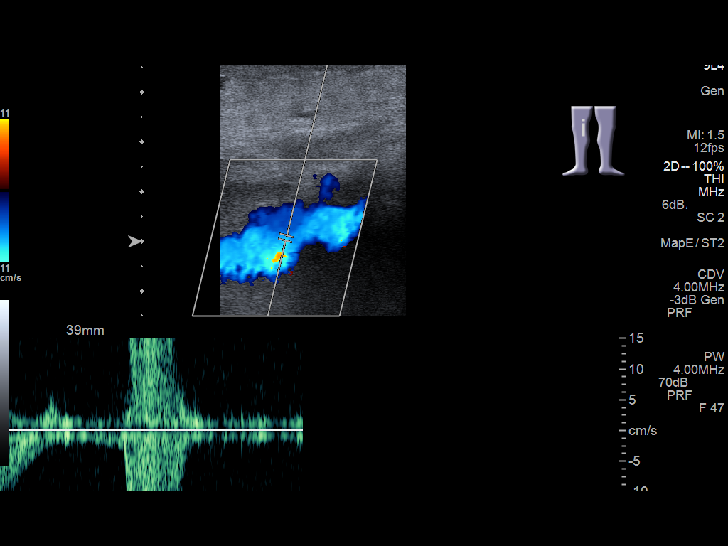
[im 28/34]
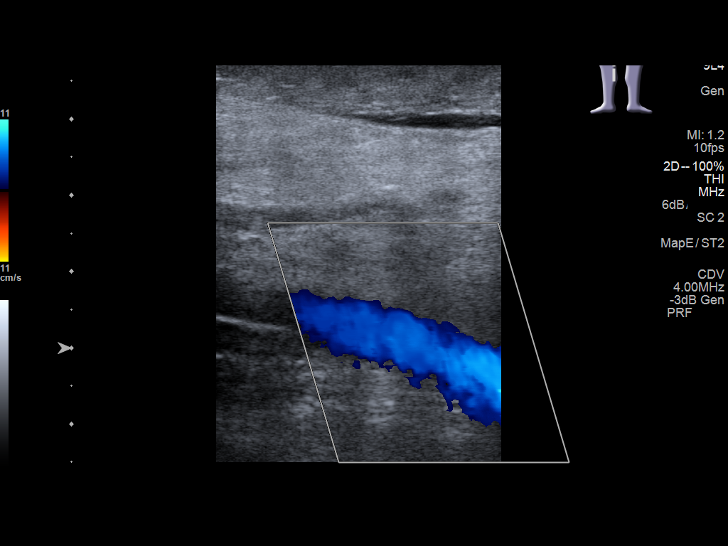
[im 31/34]
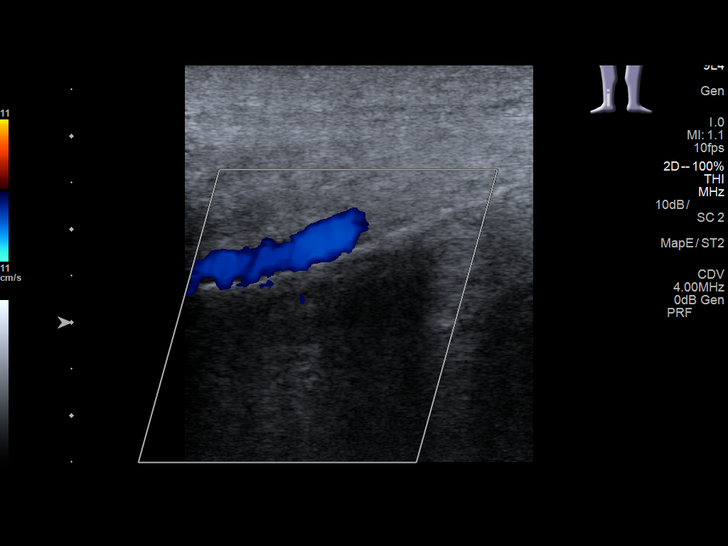
[im 34/34]
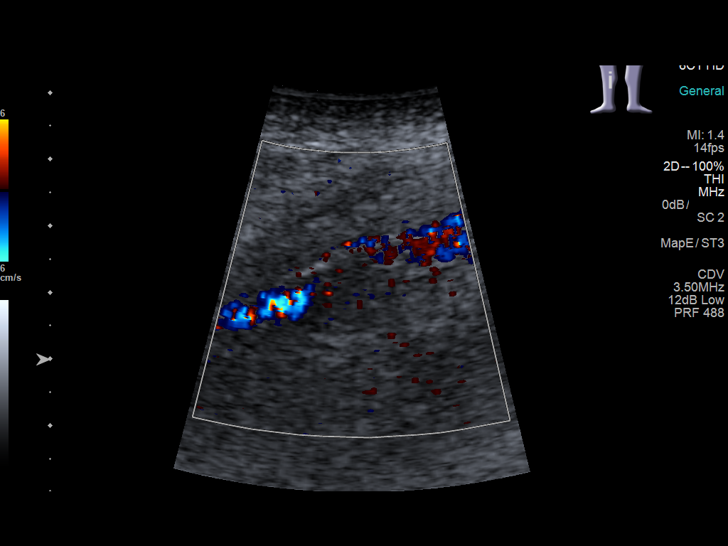

[14 of 24 positions shown; findings below may reference images not displayed]

FINDINGS: Left common femoral vein is patent without thrombus.

Normal compressibility, augmentation and color Doppler flow in the
right common femoral vein, right femoral vein and right popliteal
vein. The right saphenofemoral junction is patent. Right profunda
femoral vein is patent without thrombus. Visualized right deep calf
veins are patent without thrombus.
IMPRESSION: Negative for deep venous thrombosis in right lower extremity.

## 2018-10-29 ENCOUNTER — Ambulatory Visit: Payer: Medicare Other | Admitting: Gastroenterology

## 2019-01-14 ENCOUNTER — Ambulatory Visit: Payer: Medicare Other | Admitting: Gastroenterology

## 2019-01-25 ENCOUNTER — Other Ambulatory Visit: Payer: Self-pay

## 2019-01-25 ENCOUNTER — Ambulatory Visit (INDEPENDENT_AMBULATORY_CARE_PROVIDER_SITE_OTHER): Payer: Medicare Other | Admitting: Gastroenterology

## 2019-01-25 ENCOUNTER — Encounter: Payer: Self-pay | Admitting: Gastroenterology

## 2019-01-25 DIAGNOSIS — K529 Noninfective gastroenteritis and colitis, unspecified: Secondary | ICD-10-CM | POA: Diagnosis not present

## 2019-01-25 NOTE — Progress Notes (Signed)
Regina Antigua, MD [address]  [place], [place] [address]  Main: [phone]  Fax: [phone]   Primary Care Physician: Donnie Coffin, MD  Virtual Visit via Telephone Note  I connected with patient on 01/25/19 at  9:00 AM EDT by telephone and verified that I am speaking with the correct person using two identifiers.   I discussed the limitations, risks, security and privacy concerns of performing an evaluation and management service by telephone and the availability of in person appointments. I also discussed with the patient that there may be a patient responsible charge related to this service. The patient expressed understanding and agreed to proceed.  Location of Patient: Home Location of Provider: Home Persons involved: Patient and provider only during the visit (nursing staff and front desk staff was involved in communicating with the patient prior to the appointment, reviewing medications and checking them in)   History of Present Illness: Chief Complaint  Patient presents with  . Follow-up    chronic diarrhea, pelvic dysfuction     HPI: Regina [name] is a 85 y.o. female here for follow-up of chronic diarrhea.  Patient continues to report days without any bowel movements, and then days of loose stools.  No blood in stool.  No weight loss.  No abdominal pain.  No nausea or vomiting.  On previous admissions, it was determined that her symptoms are likely due to pelvic floor dysfunction and pelvic floor physical therapy was offered but patient refused.  She was also asked to start taking Metamucil to help bulk her stool which she has not been doing.  She uses Imodium on days with loose stools which helps.  Has previous colonoscopy with biopsies for microscopic colitis being negative.  Has history of C. difficile in January 2018.  Most recent infectious work-up in July 2019 was negative for infections including C. difficile.  Last colonoscopy, 2014  by Dr. Allen Norris for chronic diarrhea, with normal terminal ileum and normal colon reported.  Biopsies obtained and did not show any microscopic colitis.  EGD in 2013 for diarrhea and anemia, with hiatal hernia, nodule in the stomach seen and biopsied showed fundic gland polyp.  Duodenal biopsies also done and were negative for changes of celiac disease.  Colonoscopy 2012 for chronic diarrhea, with internal hemorrhoids seen.  Biopsies done.  Biopsies negative for microscopic colitis.  EGD 2009 for the chest pain, showed grade a esophagitis antral erythema.  Current Outpatient Medications  Medication Sig Dispense Refill  . acetaminophen (TYLENOL) 325 MG tablet Take 2 tablets (650 mg total) by mouth every 6 (six) hours as needed for mild pain (or Fever >/= 101).    Marland Kitchen aspirin EC 81 MG tablet Take 81 mg by mouth daily.    . bumetanide (BUMEX) 2 MG tablet Take 2 mg by mouth daily.    . clopidogrel (PLAVIX) 75 MG tablet Take 75 mg by mouth daily.    . feeding supplement, GLUCERNA SHAKE, (GLUCERNA SHAKE) LIQD Take 237 mLs by mouth 3 (three) times daily between meals. 30 Can 0  . guaiFENesin (MUCINEX) 600 MG 12 hr tablet Take 1 tablet (600 mg total) by mouth 2 (two) times daily as needed. 30 tablet 0  . insulin aspart protamine- aspart (NOVOLOG MIX 70/30) (70-30) 100 UNIT/ML injection Inject 0.28 mLs (28 Units total) into the skin 2 (two) times daily with a meal. 10 mL 11  . levothyroxine (SYNTHROID, LEVOTHROID) 50 MCG tablet Take 50 mcg by mouth  daily.    . lisinopril (PRINIVIL,ZESTRIL) 20 MG tablet Take 20 mg by mouth daily.    Marland Kitchen loperamide (IMODIUM A-D) 2 MG tablet Take 1 tablet (2 mg total) by mouth as needed for diarrhea or loose stools (2x week). 30 tablet 0  . metoprolol tartrate (LOPRESSOR) 25 MG tablet Take 1 tablet (25 mg total) by mouth 2 (two) times daily. (Patient taking differently: Take 50 mg by mouth 2 (two) times daily. )    . oxyCODONE-acetaminophen (PERCOCET/ROXICET) 5-325 MG tablet  Take 1 tablet by mouth 2 (two) times daily as needed for severe pain.    . paricalcitol (ZEMPLAR) 1 MCG capsule Take 1 mcg by mouth 3 (three) times a week. Pt takes on Monday, Wednesday, and Friday.    . Psyllium (METAMUCIL) 28.3 % POWD Take 28.3 Doses by mouth daily. 1 Bottle 3  . ranolazine (RANEXA) 500 MG 12 hr tablet Take 500 mg by mouth 2 (two) times daily.    . rosuvastatin (CRESTOR) 10 MG tablet Take 10 mg by mouth daily.    . ferrous sulfate 325 (65 FE) MG tablet Take 1 tablet (325 mg total) by mouth 2 (two) times daily with a meal. (Patient not taking: Reported on 01/25/2019)  3  . Fluticasone-Salmeterol (ADVAIR) 250-50 MCG/DOSE AEPB Inhale 1 puff into the lungs 2 (two) times daily.    Marland Kitchen gabapentin (NEURONTIN) 100 MG capsule     . ipratropium (ATROVENT) 0.03 % nasal spray Place 2 sprays into both nostrils 3 (three) times daily.    Marland Kitchen ipratropium-albuterol (DUONEB) 0.5-2.5 (3) MG/3ML SOLN Take 3 mLs by nebulization every 6 (six) hours as needed. (Patient not taking: Reported on 01/25/2019) 360 mL 0  . metroNIDAZOLE (FLAGYL) 500 MG tablet Take 1 tablet (500 mg total) by mouth 3 (three) times daily. (Patient not taking: Reported on 01/25/2019) 24 tablet 0  . saccharomyces boulardii (FLORASTOR) 250 MG capsule Take 1 capsule (250 mg total) by mouth 2 (two) times daily. (Patient not taking: Reported on 01/25/2019) 20 capsule 0   No current facility-administered medications for this visit.     Allergies as of 01/25/2019  . (No Known Allergies)    Review of Systems:    All systems reviewed and negative except where noted in HPI.   Observations/Objective:  Labs: CMP     Component Value Date/Time   NA 136 09/26/2016 0635   NA 140 02/03/2014 1149   K 5.1 09/26/2016 0635   K 3.4 (L) 02/03/2014 1149   CL 108 09/26/2016 0635   CL 104 02/03/2014 1149   CO2 21 (L) 09/26/2016 0635   CO2 28 02/03/2014 1149   GLUCOSE 144 (H) 09/26/2016 0635   GLUCOSE 84 02/03/2014 1149   BUN 33 (H) 09/26/2016  0635   BUN 18 02/03/2014 1149   CREATININE 1.71 (H) 09/26/2016 0635   CREATININE 1.35 (H) 02/03/2014 1149   CALCIUM 7.8 (L) 09/26/2016 0635   CALCIUM 9.1 02/03/2014 1149   PROT 6.2 (L) 08/28/2016 0748   PROT 6.7 02/03/2014 1149   ALBUMIN 2.8 (L) 08/28/2016 0748   ALBUMIN 3.3 (L) 02/03/2014 1149   AST 26 08/28/2016 0748   AST 25 02/03/2014 1149   ALT 11 (L) 08/28/2016 0748   ALT 21 02/03/2014 1149   ALKPHOS 51 08/28/2016 0748   ALKPHOS 80 02/03/2014 1149   BILITOT 0.5 08/28/2016 0748   BILITOT 0.4 02/03/2014 1149   GFRNONAA 27 (L) 09/26/2016 0635   GFRNONAA 38 (L) 02/03/2014 1149   GFRAA 31 (  L) 09/26/2016 0635   GFRAA 43 (L) 02/03/2014 1149   Lab Results  Component Value Date   WBC 7.4 09/26/2016   HGB 9.8 (L) 09/26/2016   HCT 29.4 (L) 09/26/2016   MCV 87.9 09/26/2016   PLT 180 09/26/2016    Imaging Studies: No results found.  Assessment and Plan:   Aralyn Nowak Hartshorn is a 85 y.o. y/o female with chronic diarrhea  Assessment and Plan: Patient has had multiple colonoscopies in the past, including colonoscopies for her chronic diarrhea which have been unrevealing.  Most of her symptoms are from pelvic floor dysfunction and physical therapy was encouraged again but patient continues to refuse  She has not started Metamucil daily and does have the bottle at home.  I have asked her to start taking it on a daily basis to avoid constipation and then postobstructive diarrhea, and also to help bulk her stool.  I have asked her to hold the medication on the days that she has loose stools as she is taking Imodium on those days.  She takes Imodium about once a week.  She was able to repeat these instructions back to me appropriately.  No alarm symptoms present Patient not interested in a colonoscopy at this time.  Due to chronic symptoms and no acute symptoms, do not suspect infectious diarrhea at this time.    Follow Up Instructions: Follow-up in 6 months or earlier if needed    I discussed the assessment and treatment plan with the patient. The patient was provided an opportunity to ask questions and all were answered. The patient agreed with the plan and demonstrated an understanding of the instructions.   The patient was advised to call back or seek an in-person evaluation if the symptoms worsen or if the condition fails to improve as anticipated.  I provided 20 minutes of non-face-to-face time during this encounter. Additional time was spent in reviewing patient's chart, placing orders etc.   Virgel Manifold, MD  Speech recognition software was used to dictate this note.

## 2019-02-08 ENCOUNTER — Telehealth: Payer: Self-pay | Admitting: Gastroenterology

## 2019-02-08 NOTE — Telephone Encounter (Signed)
Pt left vm she states she spoke to Dr. Bonna Gains the other [place] she states she needs a Statement stating why she sees Dr. Bonna Gains so she can get approved for in Home health please  Call pt

## 2019-02-08 NOTE — Telephone Encounter (Signed)
Please advise on statement for pt

## 2019-02-09 ENCOUNTER — Other Ambulatory Visit: Payer: Self-pay

## 2019-02-09 NOTE — Telephone Encounter (Signed)
Pt left vm she needs to speak to someone to explain what she needs the letter to say please call pt

## 2019-02-09 NOTE — Telephone Encounter (Signed)
Spoke with patient and relayed your message. Pt needs a letter stating she see you for chronic diarrhea, letter has been completed and mailed to pt to address on file

## 2019-04-16 ENCOUNTER — Other Ambulatory Visit
Admission: RE | Admit: 2019-04-16 | Discharge: 2019-04-16 | Disposition: A | Discharge status: Home / Self Care | Payer: Medicare Other | Source: Ambulatory Visit | Attending: Cardiology | Admitting: Cardiology

## 2019-04-16 ENCOUNTER — Other Ambulatory Visit: Payer: Self-pay

## 2019-04-16 ENCOUNTER — Other Ambulatory Visit: Payer: Medicare Other

## 2019-04-16 DIAGNOSIS — Z01812 Encounter for preprocedural laboratory examination: Secondary | ICD-10-CM | POA: Diagnosis present

## 2019-04-16 DIAGNOSIS — Z20828 Contact with and (suspected) exposure to other viral communicable diseases: Secondary | ICD-10-CM | POA: Insufficient documentation

## 2019-04-16 LAB — BASIC METABOLIC PANEL
Anion gap: 12 (ref 5–15)
BUN: 23 mg/dL (ref 8–23)
CO2: 25 mmol/L (ref 22–32)
Calcium: 8.9 mg/dL (ref 8.9–10.3)
Chloride: 101 mmol/L (ref 98–111)
Creatinine, Ser: 1.39 mg/dL — ABNORMAL HIGH (ref 0.44–1.00)
GFR calc Af Amer: 41 mL/min — ABNORMAL LOW (ref >60)
GFR calc non Af Amer: 35 mL/min — ABNORMAL LOW (ref >60)
Glucose, Bld: 146 mg/dL — ABNORMAL HIGH (ref 70–99)
Potassium: 3.6 mmol/L (ref 3.5–5.1)
Sodium: 138 mmol/L (ref 135–145)

## 2019-04-16 LAB — CBC
HCT: 33.5 % — ABNORMAL LOW (ref 36.0–46.0)
Hemoglobin: 10.8 g/dL — ABNORMAL LOW (ref 12.0–15.0)
MCH: 29.7 pg (ref 26.0–34.0)
MCHC: 32.2 g/dL (ref 30.0–36.0)
MCV: 92 fL (ref 80.0–100.0)
Platelets: 244 10*3/uL (ref 150–400)
RBC: 3.64 MIL/uL — ABNORMAL LOW (ref 3.87–5.11)
RDW: 13.8 % (ref 11.5–15.5)
WBC: 10.1 10*3/uL (ref 4.0–10.5)
nRBC: 0 % (ref 0.0–0.2)

## 2019-04-16 NOTE — H&P (View-Only) (Signed)
No orders available from Dr. Lavera Guise at [facility] appointment. Voicemail left on Dr. Jennette Kettle nurse phone. Faxed office as well for orders and H&P. Fax confirmation received. Ordered labs per anesthesia protocol only.  Melissa from Dr. Jennette Kettle office returned call. Will fax packet today.  Orders and H&P received. Chest x-ray will have to be done on [place] of surgery.

## 2019-04-16 NOTE — Progress Notes (Addendum)
No orders available from Dr. Lavera Guise at [facility] appointment. Voicemail left on Dr. Jennette Kettle nurse phone. Faxed office as well for orders and H&P. Fax confirmation received. Ordered labs per anesthesia protocol only.  Melissa from Dr. Jennette Kettle office returned call. Will fax packet today.  Orders and H&P received. Chest x-ray will have to be done on [place] of surgery.

## 2019-04-16 NOTE — Patient Instructions (Addendum)
Your procedure is scheduled on: Wednesday 04/21/19.  Report to [facility] LOCATED ON 2ND FLOOR MEDICAL MALL ENTRANCE. To find out your arrival time please call [phone] between 1PM - 3PM on Tuesday 04/20/19.   Remember: Instructions that are not followed completely may result in serious medical risk, up to and including death, or upon the discretion of your surgeon and anesthesiologist your surgery may need to be rescheduled.      _X__ 1. Do not eat food after midnight the night before your procedure.                 No gum chewing or hard candies. You may drink WATER up to 2 hours                 before you are scheduled to arrive for your surgery- DO NOT       water within 2 hours of the start of your surgery.                   __X__2.  On the morning of surgery brush your teeth with toothpaste and water, you may rinse your mouth with mouthwash if you wish.  Do not swallow any toothpaste or mouthwash.     __X__3.  Notify your doctor if there is any change in your medical condition      (cold, fever, infections).     Do not wear jewelry, make-up, hairpins, clips or nail polish. Do not wear lotions, powders, or perfumes.  Do not shave 48 hours prior to surgery. Men may shave face and neck. Do not bring valuables to the hospital.    [facility] is not responsible for any belongings or valuables.   Contacts, dentures/partials or body piercings may not be worn into surgery. Bring a case for your contacts, glasses or hearing aids, a denture cup will be supplied.    Patients discharged the [place] of surgery will not be allowed to drive home.   Please read over the following fact sheets that you were given:   MRSA Information   __X__ Take these medicines the morning of surgery with A SIP OF WATER:     1. paricalcitol (ZEMPLAR) 1 MCG capsule  2. ranolazine (RANEXA) 500 MG 12 hr tablet  3. sodium bicarbonate 650 MG tablet      __X__ Use CHG SAGE wipes as  directed     __X__ Take 1/2 of usual insulin dose the night before surgery. No insulin the morning          of surgery.    __X__ Stop Blood Thinners: Plavix. You stopped taking this on Tuesday 04/13/19 according to Dr. Jennette Kettle instructions.   __X__ Dennis Bast stopped taking your Metoprolol on Tuesday 04/13/19 according to Dr. Jennette Kettle instructions.   __X__ Stop Anti-inflammatories 7 days before surgery such as Advil, Ibuprofen, Motrin, BC or Goodies Powder, Naprosyn, Naproxen, Aleve, Aspirin, Meloxicam. May take Tylenol if needed for pain or discomfort.    __X__ Please don't start taking any new herbal supplements until after your procedure.Marland Kitchen

## 2019-04-17 LAB — SARS CORONAVIRUS 2 (TAT 6-24 HRS): SARS Coronavirus 2: NEGATIVE

## 2019-04-17 NOTE — Pre-Procedure Instructions (Signed)
Met B and CBC results sent to Dr. Lavera Guise and Anesthesia for review.

## 2019-04-18 ENCOUNTER — Ambulatory Visit
Admission: RE | Admit: 2019-04-18 | Discharge: 2019-04-18 | Disposition: A | Discharge status: Home / Self Care | Payer: Medicare Other | Source: Ambulatory Visit | Attending: Cardiology | Admitting: Cardiology

## 2019-04-18 ENCOUNTER — Other Ambulatory Visit: Payer: Self-pay | Admitting: Cardiology

## 2019-04-18 ENCOUNTER — Ambulatory Visit
Admission: RE | Admit: 2019-04-18 | Discharge: 2019-04-18 | Disposition: A | Discharge status: Home / Self Care | Payer: Medicare Other | Attending: Cardiology | Admitting: Cardiology

## 2019-04-18 DIAGNOSIS — Z01818 Encounter for other preprocedural examination: Secondary | ICD-10-CM

## 2019-04-20 MED ORDER — CEFAZOLIN SODIUM-DEXTROSE 1-4 GM/50ML-% IV SOLN
1.0000 g | Freq: Once | INTRAVENOUS | Status: AC
  Administered 2019-04-21: 13:00:00 1 g via INTRAVENOUS

## 2019-04-21 ENCOUNTER — Other Ambulatory Visit: Payer: Self-pay

## 2019-04-21 ENCOUNTER — Encounter
Admission: RE | Disposition: A | Discharge status: Home / Self Care | Payer: Self-pay | Source: Home / Self Care | Attending: Cardiology

## 2019-04-21 ENCOUNTER — Ambulatory Visit
Admission: RE | Admit: 2019-04-21 | Discharge: 2019-04-21 | Disposition: A | Discharge status: Home / Self Care | Payer: Medicare Other | Attending: Cardiology | Admitting: Cardiology

## 2019-04-21 ENCOUNTER — Ambulatory Visit: Payer: Medicare Other | Admitting: Anesthesiology

## 2019-04-21 ENCOUNTER — Encounter: Payer: Self-pay | Admitting: Anesthesiology

## 2019-04-21 DIAGNOSIS — Z8673 Personal history of transient ischemic attack (TIA), and cerebral infarction without residual deficits: Secondary | ICD-10-CM | POA: Insufficient documentation

## 2019-04-21 DIAGNOSIS — N182 Chronic kidney disease, stage 2 (mild): Secondary | ICD-10-CM | POA: Diagnosis not present

## 2019-04-21 DIAGNOSIS — R0602 Shortness of breath: Secondary | ICD-10-CM | POA: Insufficient documentation

## 2019-04-21 DIAGNOSIS — E119 Type 2 diabetes mellitus without complications: Secondary | ICD-10-CM | POA: Insufficient documentation

## 2019-04-21 DIAGNOSIS — E039 Hypothyroidism, unspecified: Secondary | ICD-10-CM | POA: Insufficient documentation

## 2019-04-21 DIAGNOSIS — I129 Hypertensive chronic kidney disease with stage 1 through stage 4 chronic kidney disease, or unspecified chronic kidney disease: Secondary | ICD-10-CM | POA: Diagnosis not present

## 2019-04-21 DIAGNOSIS — I482 Chronic atrial fibrillation, unspecified: Secondary | ICD-10-CM | POA: Insufficient documentation

## 2019-04-21 DIAGNOSIS — I252 Old myocardial infarction: Secondary | ICD-10-CM | POA: Insufficient documentation

## 2019-04-21 DIAGNOSIS — Z6841 Body Mass Index (BMI) 40.0 and over, adult: Secondary | ICD-10-CM | POA: Diagnosis not present

## 2019-04-21 DIAGNOSIS — Z419 Encounter for procedure for purposes other than remedying health state, unspecified: Secondary | ICD-10-CM

## 2019-04-21 DIAGNOSIS — I2510 Atherosclerotic heart disease of native coronary artery without angina pectoris: Secondary | ICD-10-CM | POA: Diagnosis not present

## 2019-04-21 DIAGNOSIS — K219 Gastro-esophageal reflux disease without esophagitis: Secondary | ICD-10-CM | POA: Diagnosis not present

## 2019-04-21 DIAGNOSIS — Z45010 Encounter for checking and testing of cardiac pacemaker pulse generator [battery]: Secondary | ICD-10-CM | POA: Insufficient documentation

## 2019-04-21 DIAGNOSIS — E8881 Metabolic syndrome: Secondary | ICD-10-CM | POA: Diagnosis not present

## 2019-04-21 DIAGNOSIS — E785 Hyperlipidemia, unspecified: Secondary | ICD-10-CM | POA: Insufficient documentation

## 2019-04-21 DIAGNOSIS — Z955 Presence of coronary angioplasty implant and graft: Secondary | ICD-10-CM | POA: Insufficient documentation

## 2019-04-21 DIAGNOSIS — I495 Sick sinus syndrome: Secondary | ICD-10-CM | POA: Diagnosis not present

## 2019-04-21 HISTORY — PX: PACEMAKER INSERTION: SHX728

## 2019-04-21 LAB — GLUCOSE, CAPILLARY
Glucose-Capillary: 96 mg/dL (ref 70–99)
Glucose-Capillary: 114 mg/dL — ABNORMAL HIGH (ref 70–99)

## 2019-04-21 SURGERY — INSERTION, CARDIAC PACEMAKER
Anesthesia: Monitor Anesthesia Care | Laterality: Left

## 2019-04-21 MED ORDER — PHENYLEPHRINE HCL (PRESSORS) 10 MG/ML IV SOLN
INTRAVENOUS | Status: DC | PRN
  Administered 2019-04-21: 100 ug via INTRAVENOUS

## 2019-04-21 MED ORDER — PROPOFOL 10 MG/ML IV BOLUS
INTRAVENOUS | Status: AC
  Filled 2019-04-21: qty 40

## 2019-04-21 MED ORDER — FAMOTIDINE 20 MG PO TABS
20.0000 mg | ORAL_TABLET | Freq: Once | ORAL | Status: AC
  Administered 2019-04-21: 11:00:00 20 mg via ORAL

## 2019-04-21 MED ORDER — FAMOTIDINE 20 MG PO TABS
ORAL_TABLET | ORAL | Status: AC
  Administered 2019-04-21: 20 mg via ORAL
  Filled 2019-04-21: qty 1

## 2019-04-21 MED ORDER — FENTANYL CITRATE (PF) 100 MCG/2ML IJ SOLN: 25.0000 ug | INTRAMUSCULAR | Status: DC | PRN

## 2019-04-21 MED ORDER — GENTAMICIN SULFATE 40 MG/ML IJ SOLN
INTRAMUSCULAR | Status: AC
  Filled 2019-04-21: qty 2

## 2019-04-21 MED ORDER — ONDANSETRON HCL 4 MG/2ML IJ SOLN: 4.0000 mg | Freq: Once | INTRAMUSCULAR | Status: DC | PRN

## 2019-04-21 MED ORDER — SODIUM CHLORIDE 0.9 % IV SOLN
INTRAVENOUS | Status: DC
  Administered 2019-04-21: 13:00:00 via INTRAVENOUS

## 2019-04-21 MED ORDER — LIDOCAINE HCL 1 % IJ SOLN
INTRAMUSCULAR | Status: DC | PRN
  Administered 2019-04-21: 17 mL

## 2019-04-21 MED ORDER — CEFAZOLIN SODIUM-DEXTROSE 1-4 GM/50ML-% IV SOLN
INTRAVENOUS | Status: AC
  Filled 2019-04-21: qty 50

## 2019-04-21 MED ORDER — PROPOFOL 500 MG/50ML IV EMUL
INTRAVENOUS | Status: DC | PRN
  Administered 2019-04-21: 75 ug/kg/min via INTRAVENOUS

## 2019-04-21 SURGICAL SUPPLY — 34 items
BLADE SURG 15 STRL LF DISP TIS (BLADE) ×1 IMPLANT
BLADE SURG 15 STRL SS (BLADE) ×1
CABLE SURG 12 DISP A/V CHANNEL (MISCELLANEOUS) IMPLANT
CANISTER SUCT 1200ML W/VALVE (MISCELLANEOUS) ×2 IMPLANT
CHLORAPREP W/TINT 26 (MISCELLANEOUS) ×2 IMPLANT
COVER LIGHT HANDLE STERIS (MISCELLANEOUS) ×4 IMPLANT
COVER MAYO STAND REUSABLE (DRAPES) ×2 IMPLANT
DRAPE C-ARM XRAY 36X54 (DRAPES) IMPLANT
DRSG TEGADERM 4X4.75 (GAUZE/BANDAGES/DRESSINGS) ×4 IMPLANT
DRSG TELFA 4X3 1S NADH ST (GAUZE/BANDAGES/DRESSINGS) ×2 IMPLANT
ELECT REM PT RETURN 9FT ADLT (ELECTROSURGICAL) ×2
ELECTRODE REM PT RTRN 9FT ADLT (ELECTROSURGICAL) ×1 IMPLANT
GLOVE BIO SURGEON STRL SZ7 (GLOVE) ×4 IMPLANT
GOWN STRL REUS W/ TWL LRG LVL3 (GOWN DISPOSABLE) ×2 IMPLANT
GOWN STRL REUS W/TWL LRG LVL3 (GOWN DISPOSABLE) ×2
IMMOBILIZER SHDR LG LX 900803 (SOFTGOODS) IMPLANT
INTRO PACEMAKR LEAD 7FR 23CM (INTRODUCER)
INTRO PACEMAKR LEAD 9FR 23CM (INTRODUCER)
INTRODUCER PACEMKR LD 7FR 23CM (INTRODUCER) IMPLANT
INTRODUCER PACEMKR LD 9FR 23CM (INTRODUCER) IMPLANT
IPG PACE AZUR XT DR MRI W1DR01 (Pacemaker) ×1 IMPLANT
KIT TURNOVER KIT A (KITS) ×2 IMPLANT
LABEL OR SOLS (LABEL) IMPLANT
NEEDLE HYPO 25X1 1.5 SAFETY (NEEDLE) ×2 IMPLANT
NS IRRIG 500ML POUR BTL (IV SOLUTION) ×2 IMPLANT
PACE AZURE XT DR MRI W1DR01 (Pacemaker) ×2 IMPLANT
PACK PACE INSERTION (MISCELLANEOUS) ×2 IMPLANT
SUCTION FRAZIER HANDLE 10FR (MISCELLANEOUS) ×1
SUCTION TUBE FRAZIER 10FR DISP (MISCELLANEOUS) ×1 IMPLANT
SUT SILK 2 0 SH (SUTURE) ×2 IMPLANT
SUT SILK 3 0 (SUTURE)
SUT SILK 3-0 (SUTURE) IMPLANT
SUT SILK 3-0 18XBRD TIE 12 (SUTURE) IMPLANT
SUT SILK 4 0 SH (SUTURE) IMPLANT

## 2019-04-21 NOTE — Anesthesia Preprocedure Evaluation (Signed)
Anesthesia Evaluation  Patient identified by MRN, date of birth, ID band Patient awake    Reviewed: Allergy & Precautions, NPO status , Patient's Chart, lab work & pertinent test results, reviewed documented beta blocker date and time   Airway Mallampati: III  TM Distance: >3 FB     Dental  (+) Chipped   Pulmonary           Cardiovascular hypertension, Pt. on medications and Pt. on home beta blockers + CAD and + Cardiac Stents  + dysrhythmias Atrial Fibrillation      Neuro/Psych    GI/Hepatic GERD  Controlled,  Endo/Other  diabetes, Type 2Hypothyroidism Morbid obesity  Renal/GU      Musculoskeletal   Abdominal   Peds  Hematology   Anesthesia Other Findings   Reproductive/Obstetrics                             Anesthesia Physical Anesthesia Plan  ASA: III  Anesthesia Plan: MAC   Post-op Pain Management:    Induction: Intravenous  PONV Risk Score and Plan:   Airway Management Planned:   Additional Equipment:   Intra-op Plan:   Post-operative Plan:   Informed Consent: I have reviewed the patients History and Physical, chart, labs and discussed the procedure including the risks, benefits and alternatives for the proposed anesthesia with the patient or authorized representative who has indicated his/her understanding and acceptance.       Plan Discussed with: CRNA  Anesthesia Plan Comments:         Anesthesia Quick Evaluation

## 2019-04-21 NOTE — Discharge Instructions (Signed)
Call for any fever , swelling of pacer site, or bleeding  AMBULATORY SURGERY  DISCHARGE INSTRUCTIONS   1) The drugs that you were given will stay in your system until tomorrow so for the next 24 hours you should not:  A) Drive an automobile B) Make any legal decisions C) Drink any alcoholic beverage   2) You may resume regular meals tomorrow.  Today it is better to start with liquids and gradually work up to solid foods.  You may eat anything you prefer, but it is better to start with liquids, then soup and crackers, and gradually work up to solid foods.   3) Please notify your doctor immediately if you have any unusual bleeding, trouble breathing, redness and pain at the surgery site, drainage, fever, or pain not relieved by medication.    4) Additional Instructions:        Please contact your physician with any problems or [facility] at [phone], Monday through Friday 6 am to 4 pm, or [place] [facility]  number at [phone].

## 2019-04-21 NOTE — Anesthesia Postprocedure Evaluation (Signed)
Anesthesia Post Note  Patient: Regina [name]  Procedure(s) Performed: Pacemaker Change Out (Left )  Patient location during evaluation: PACU Anesthesia Type: General Level of consciousness: awake and alert Pain management: pain level controlled Vital Signs Assessment: post-procedure vital signs reviewed and stable Respiratory status: spontaneous breathing, nonlabored ventilation, respiratory function stable and patient connected to nasal cannula oxygen Cardiovascular status: blood pressure returned to baseline and stable Postop Assessment: no apparent nausea or vomiting Anesthetic complications: no     Last Vitals:  Vitals:   04/21/19 1358 04/21/19 1400  BP: 111/72 111/72  Pulse: (!) 56 67  Resp: 17 (!) 21  Temp: 36.5 C   SpO2: 100% 100%    Last Pain:  Vitals:   04/21/19 1400  TempSrc:   PainSc: 0-No pain                 Precious Haws Piscitello

## 2019-04-21 NOTE — Anesthesia Post-op Follow-up Note (Signed)
Anesthesia QCDR form completed.        

## 2019-04-21 NOTE — Transfer of Care (Signed)
Immediate Anesthesia Transfer of Care Note  Patient: Regina [name]  Procedure(s) Performed: Pacemaker Change Out (Left )  Patient Location: PACU  Anesthesia Type:MAC  Level of Consciousness: awake, alert  and oriented  Airway & Oxygen Therapy: Patient Spontanous Breathing and Patient connected to nasal cannula oxygen  Post-op Assessment: Report given to RN and Post -op Vital signs reviewed and stable  Post vital signs: Reviewed and stable  Last Vitals:  Vitals Value Taken Time  BP    Temp    Pulse    Resp    SpO2      Last Pain:  Vitals:   04/21/19 1039  TempSrc: Temporal  PainSc: 3          Complications: No apparent anesthesia complications

## 2019-04-21 NOTE — Op Note (Signed)
Preoperative diagnosis SSS Postoperative diagnosis same  Procedure: Generator replacement    Following informed consent the patient was brought to the operating room and placed in the supine position.  After routine prep and drape lidocaine was infiltrated in the region of the previous incision and carried down to later the device pocket using sharp dissection . The pocket was opened the device was freed up and was explanted.  Interrogation of the previously implanted ventricular lead   demonstrated an R wave of 13.8  millivolts., and impedance of 665 ohms, and a pacing threshold of3.25 volts .    The previously implanted atrial lead  Was  normal  The leads were inspected. The leads were then attached to a [place] pulse generator, serial number [id number].    The pocket was irrigated with antibiotic containing saline solution hemostasis was assured and the leads and the device were placed in the pocket. The wound was then closed in  2  layers in normal fashion.  De  EBL minimal 2.5 cc  The patient tolerated the procedure without apparent complication.  Patient was sent to recovery room in stable condition.  Family was notified about patient condition.  [employer]

## 2019-04-21 NOTE — Interval H&P Note (Signed)
History and Physical Interval Note:  04/21/2019 11:58 AM  Regina [name]  has presented today for surgery, with the diagnosis of I44.2.  The various methods of treatment have been discussed with the patient and family. After consideration of risks, benefits and other options for treatment, the patient has consented to  Procedure(s): Pacemaker Change Out (Left) as a surgical intervention.  The patient's history has been reviewed, patient examined, no change in status, stable for surgery.  I have reviewed the patient's chart and labs.  Questions were answered to the patient's satisfaction.     [employer]

## 2019-05-19 ENCOUNTER — Encounter: Payer: Self-pay | Admitting: Cardiology

## 2019-08-11 ENCOUNTER — Other Ambulatory Visit
Admission: RE | Admit: 2019-08-11 | Discharge: 2019-08-11 | Disposition: A | Discharge status: Home / Self Care | Payer: Medicare Other | Attending: Nephrology | Admitting: Nephrology

## 2019-08-11 DIAGNOSIS — E1129 Type 2 diabetes mellitus with other diabetic kidney complication: Secondary | ICD-10-CM | POA: Insufficient documentation

## 2019-08-11 DIAGNOSIS — R8281 Pyuria: Secondary | ICD-10-CM | POA: Diagnosis not present

## 2019-08-11 DIAGNOSIS — I129 Hypertensive chronic kidney disease with stage 1 through stage 4 chronic kidney disease, or unspecified chronic kidney disease: Secondary | ICD-10-CM | POA: Diagnosis not present

## 2019-08-11 DIAGNOSIS — N181 Chronic kidney disease, stage 1: Secondary | ICD-10-CM | POA: Insufficient documentation

## 2019-08-11 DIAGNOSIS — N2581 Secondary hyperparathyroidism of renal origin: Secondary | ICD-10-CM | POA: Diagnosis not present

## 2019-08-11 LAB — RENAL FUNCTION PANEL
Albumin: 3.6 g/dL (ref 3.5–5.0)
Anion gap: 12 (ref 5–15)
BUN: 25 mg/dL — ABNORMAL HIGH (ref 8–23)
CO2: 25 mmol/L (ref 22–32)
Calcium: 8.9 mg/dL (ref 8.9–10.3)
Chloride: 102 mmol/L (ref 98–111)
Creatinine, Ser: 1.58 mg/dL — ABNORMAL HIGH (ref 0.44–1.00)
GFR calc Af Amer: 35 mL/min — ABNORMAL LOW (ref >60)
GFR calc non Af Amer: 30 mL/min — ABNORMAL LOW (ref >60)
Glucose, Bld: 170 mg/dL — ABNORMAL HIGH (ref 70–99)
Phosphorus: 3.2 mg/dL (ref 2.5–4.6)
Potassium: 4.3 mmol/L (ref 3.5–5.1)
Sodium: 139 mmol/L (ref 135–145)

## 2019-08-11 LAB — URINALYSIS, ROUTINE W REFLEX MICROSCOPIC
Bilirubin Urine: NEGATIVE
Glucose, UA: NEGATIVE mg/dL
Hgb urine dipstick: NEGATIVE
Ketones, ur: NEGATIVE mg/dL
Nitrite: POSITIVE — AB
Protein, ur: 100 mg/dL — AB
Specific Gravity, Urine: 1.018 (ref 1.005–1.030)
WBC, UA: >50 WBC/hpf — ABNORMAL HIGH (ref 0–5)
pH: 6 (ref 5.0–8.0)

## 2019-08-11 LAB — CBC WITH DIFFERENTIAL/PLATELET
Abs Immature Granulocytes: 0.03 10*3/uL (ref 0.00–0.07)
Basophils Absolute: 0.1 10*3/uL (ref 0.0–0.1)
Basophils Relative: 1 %
Eosinophils Absolute: 0.2 10*3/uL (ref 0.0–0.5)
Eosinophils Relative: 2 %
HCT: 33.8 % — ABNORMAL LOW (ref 36.0–46.0)
Hemoglobin: 11 g/dL — ABNORMAL LOW (ref 12.0–15.0)
Immature Granulocytes: 0 %
Lymphocytes Relative: 20 %
Lymphs Abs: 1.5 10*3/uL (ref 0.7–4.0)
MCH: 29.5 pg (ref 26.0–34.0)
MCHC: 32.5 g/dL (ref 30.0–36.0)
MCV: 90.6 fL (ref 80.0–100.0)
Monocytes Absolute: 0.7 10*3/uL (ref 0.1–1.0)
Monocytes Relative: 10 %
Neutro Abs: 4.9 10*3/uL (ref 1.7–7.7)
Neutrophils Relative %: 67 %
Platelets: 242 10*3/uL (ref 150–400)
RBC: 3.73 MIL/uL — ABNORMAL LOW (ref 3.87–5.11)
RDW: 13.7 % (ref 11.5–15.5)
WBC: 7.3 10*3/uL (ref 4.0–10.5)
nRBC: 0 % (ref 0.0–0.2)

## 2019-08-11 LAB — PROTEIN / CREATININE RATIO, URINE
Creatinine, Urine: 112 mg/dL
Protein Creatinine Ratio: 0.55 mg/mg{Cre} — ABNORMAL HIGH (ref 0.00–0.15)
Total Protein, Urine: 62 mg/dL

## 2019-08-11 LAB — MAGNESIUM: Magnesium: 2.2 mg/dL (ref 1.7–2.4)

## 2019-08-12 LAB — PARATHYROID HORMONE, INTACT (NO CA): PTH: 61 pg/mL (ref 15–65)

## 2019-08-13 LAB — URINE CULTURE: Culture: >=100000 — AB

## 2019-09-07 ENCOUNTER — Ambulatory Visit: Payer: Medicare Other | Attending: Internal Medicine

## 2019-09-07 DIAGNOSIS — Z20822 Contact with and (suspected) exposure to covid-19: Secondary | ICD-10-CM

## 2019-09-08 LAB — NOVEL CORONAVIRUS, NAA: SARS-CoV-2, NAA: NOT DETECTED

## 2019-10-25 ENCOUNTER — Other Ambulatory Visit: Payer: Self-pay

## 2019-10-25 ENCOUNTER — Encounter: Payer: Self-pay | Admitting: Urology

## 2019-10-25 ENCOUNTER — Ambulatory Visit (INDEPENDENT_AMBULATORY_CARE_PROVIDER_SITE_OTHER): Payer: Medicare Other | Admitting: Urology

## 2019-10-25 VITALS — BP 101/66 | HR 68 | Ht 59.0 in | Wt 212.0 lb

## 2019-10-25 DIAGNOSIS — N3941 Urge incontinence: Secondary | ICD-10-CM

## 2019-10-25 DIAGNOSIS — N3946 Mixed incontinence: Secondary | ICD-10-CM

## 2019-10-25 MED ORDER — MIRABEGRON ER 25 MG PO TB24: 25.0000 mg | ORAL_TABLET | Freq: Every day | ORAL | 11 refills | Status: DC

## 2019-10-25 MED ORDER — CIPROFLOXACIN HCL 250 MG PO TABS: 250.0000 mg | ORAL_TABLET | Freq: Two times a day (BID) | ORAL | 0 refills | Status: DC

## 2019-10-25 NOTE — Addendum Note (Signed)
Addended by: Alvera Novel on: 10/25/2019 04:02 PM   Modules accepted: Orders

## 2019-10-25 NOTE — Progress Notes (Signed)
10/25/2019 2:59 PM   Regina [name] [birth year] [id number]  Referring provider: Donnie Coffin, MD [address] [place],  [place] [address]  No chief complaint on file.   HPI I was consulted to assess the patient's frequency and urge incontinence.  She can soak 5 pads a [place].  She can void every 10 minutes.  She cannot sit through a 2-hour movie.  She has no enuresis.  She gets up 2-3 times a night.  She utilizes a walker.  She describes her recent bladder infection.  She has chronic renal insufficiency.  There was one recent positive culture in the chart  She did not want me to examine her and she could not leave a urine sample     PMH: Past Medical History:  Diagnosis Date  . Breast cancer, left [facility]) 2013   Mastectomy.   . CAD (coronary artery disease)    s/p cath in 2009 with 2 stents  . Diabetes mellitus without complication ([place])   . GERD (gastroesophageal reflux disease)   . HLD (hyperlipidemia)   . Hypertension   . Hypothyroidism   . Paroxysmal atrial fibrillation [facility])     Surgical History: Past Surgical History:  Procedure Laterality Date  . BREAST LUMPECTOMY    . CARDIAC CATHETERIZATION    . CHOLECYSTECTOMY    . MASTECTOMY Left   . PACEMAKER INSERTION Left 04/21/2019   Procedure: Pacemaker Change Out;  Surgeon: Cletis Athens, MD;  Location: [facility] ORS;  Service: Cardiovascular;  Laterality: Left;  . STENT PLACEMENT VASCULAR ([facility] HX)      Home Medications:  Allergies as of 10/25/2019   No Known Allergies     Medication List       Accurate as of October 25, 2019  2:59 PM. If you have any questions, ask your nurse or doctor.        aspirin EC 81 MG tablet Take 81 mg by mouth daily.   bumetanide 2 MG tablet Commonly known as: BUMEX Take 2 mg by mouth daily.   clopidogrel 75 MG tablet Commonly known as: PLAVIX Take 75 mg by mouth daily.   insulin aspart protamine- aspart (70-30) 100 UNIT/ML injection Commonly known as: NOVOLOG MIX  70/30 Inject 0.28 mLs (28 Units total) into the skin 2 (two) times daily with a meal. What changed:   how much to take  when to take this   levothyroxine 50 MCG tablet Commonly known as: SYNTHROID Take 50 mcg by mouth daily.   lisinopril 20 MG tablet Commonly known as: ZESTRIL Take 20 mg by mouth daily.   loperamide 2 MG tablet Commonly known as: Imodium A-D Take 1 tablet (2 mg total) by mouth as needed for diarrhea or loose stools (2x week).   metoprolol succinate 50 MG 24 hr tablet Commonly known as: TOPROL-XL Take 50 mg by mouth 2 (two) times daily. Take with or immediately following a meal.   oxyCODONE-acetaminophen 5-325 MG tablet Commonly known as: PERCOCET/ROXICET Take 1 tablet by mouth 2 (two) times daily as needed for severe pain.   paricalcitol 1 MCG capsule Commonly known as: ZEMPLAR Take 1 mcg by mouth 3 (three) times a week. Pt takes on Monday, Wednesday, and Friday.   Psyllium 28.3 % Powd Commonly known as: Metamucil Take 28.3 Doses by mouth daily.   ranolazine 500 MG 12 hr tablet Commonly known as: RANEXA Take 500 mg by mouth 2 (two) times daily.   rosuvastatin 10 MG tablet Commonly known as: CRESTOR  Take 10 mg by mouth daily.   sodium bicarbonate 650 MG tablet Take 650 mg by mouth 2 (two) times daily.       Allergies: No Known Allergies  Family History: Family History  Problem Relation Age of Onset  . Kidney failure Mother   . Lung cancer Father     Social History:  reports that she has never smoked. She has never used smokeless tobacco. She reports that she does not drink alcohol or use drugs.  ROS:                                        Physical Exam: There were no vitals taken for this visit.  Constitutional:  Alert and oriented, No acute distress. HEENT: [place] AT, moist mucus membranes.  Trachea midline, no masses. Cardiovascular: No clubbing, cyanosis, or edema. Respiratory: Normal respiratory effort, no  increased work of breathing. GI: Abdomen is soft, nontender, nondistended, no abdominal masses GU: No CVA tenderness.  Sitting in wheelchair Skin: No rashes, bruises or suspicious lesions. Lymph: No cervical or inguinal adenopathy. Neurologic: Grossly intact, no focal deficits, moving all 4 extremities. Psychiatric: Normal mood and affect.  Laboratory Data: Lab Results  Component Value Date   WBC 7.3 08/11/2019   HGB 11.0 (L) 08/11/2019   HCT 33.8 (L) 08/11/2019   MCV 90.6 08/11/2019   PLT 242 08/11/2019    Lab Results  Component Value Date   CREATININE 1.58 (H) 08/11/2019    No results found for: PSA  No results found for: TESTOSTERONE  Lab Results  Component Value Date   HGBA1C 6.7 (H) 06/27/2016    Urinalysis    Component Value Date/Time   COLORURINE AMBER (A) 08/11/2019 1201   APPEARANCEUR CLOUDY (A) 08/11/2019 1201   APPEARANCEUR [place] 07/03/2012 1616   LABSPEC 1.018 08/11/2019 1201   LABSPEC 1.008 07/03/2012 1616   PHURINE 6.0 08/11/2019 [place] 08/11/2019 1201   GLUCOSEU Negative 07/03/2012 [place] 08/11/2019 1201   [place] 08/11/2019 1201   BILIRUBINUR Negative 07/03/2012 [place] 08/11/2019 1201   PROTEINUR 100 (A) 08/11/2019 1201   NITRITE POSITIVE (A) 08/11/2019 1201   LEUKOCYTESUR LARGE (A) 08/11/2019 1201   LEUKOCYTESUR Trace 07/03/2012 1616    Pertinent Imaging: Recent CT no significant abnormalities on CT scan 1 year ago  Assessment & Plan: Patient has urge incontinence and frequency.  She has medical comorbidities.  It may be difficult to reach her treatment goal.  She was reluctant to be examined and I will try to get a urine sample next time sent for culture.  I will reassess her on Myrbetriq 25 mg samples and prescription in 6 weeks  I also suggested that we could empirically treat her for a bladder infection since the last 3 days she is feeling more pressure.  I found the  conversation became quite circular and she tends to control her wishes and was less accepting of the solutions.  We finally had her void into a hat and we sent her for culture.  I will call with the culture results.  I gave her Myrbetriq samples and prescription reassess 6 weeks  Patient request we called in ciprofloxacin 2 and 50 mg twice a [place] for 1 week.  I was teasing her about her hat    1. Urge incontinence  - Urinalysis, Complete - BLADDER  SCAN AMB NON-IMAGING   No follow-ups on file.  Reece Packer, MD  [place] [address] [place], [place] [address] [phone]

## 2019-10-26 LAB — URINALYSIS, COMPLETE
Bilirubin, UA: NEGATIVE
Glucose, UA: NEGATIVE
Ketones, UA: NEGATIVE
Nitrite, UA: NEGATIVE
Specific Gravity, UA: 1.025 (ref 1.005–1.030)
Urobilinogen, Ur: 1 mg/dL (ref 0.2–1.0)
pH, UA: 5 (ref 5.0–7.5)

## 2019-10-26 LAB — MICROSCOPIC EXAMINATION: WBC, UA: >30 /hpf — AB (ref 0–5)

## 2019-10-27 LAB — CULTURE, URINE COMPREHENSIVE

## 2019-12-06 ENCOUNTER — Ambulatory Visit: Payer: Self-pay | Admitting: Urology

## 2019-12-16 ENCOUNTER — Ambulatory Visit (INDEPENDENT_AMBULATORY_CARE_PROVIDER_SITE_OTHER): Payer: Medicare Other | Admitting: Internal Medicine

## 2019-12-16 ENCOUNTER — Other Ambulatory Visit: Payer: Self-pay

## 2019-12-16 VITALS — BP 133/55 | HR 66

## 2019-12-16 DIAGNOSIS — Z853 Personal history of malignant neoplasm of breast: Secondary | ICD-10-CM | POA: Diagnosis not present

## 2019-12-16 DIAGNOSIS — Z950 Presence of cardiac pacemaker: Secondary | ICD-10-CM | POA: Insufficient documentation

## 2019-12-16 DIAGNOSIS — I495 Sick sinus syndrome: Secondary | ICD-10-CM

## 2019-12-16 NOTE — Progress Notes (Signed)
Established Patient Office Visit  Subjective:  Patient ID: Regina [name], female    DOB: 1934-07-27  Age: 86 y.o. MRN: [id number]  CC:  Chief Complaint  Patient presents with  . Pacemaker Check    HPI  Regina [name] presents for pacemaker check denies any chest pain shortness of breath there is no history of passing out spell.  She did not give any history of collapse.  There is no history of fever chills or orthopnea.  She came walking with the help of a walker.  There is no history of recent TIA and denies any history of paroxysmal nocturnal dyspnea.  Patient is known to have atrial fibrillation.    Past Medical History:  Diagnosis Date  . Breast cancer, left [facility]) 2013   Mastectomy.   . CAD (coronary artery disease)    s/p cath in 2009 with 2 stents  . Diabetes mellitus without complication ([place])   . GERD (gastroesophageal reflux disease)   . HLD (hyperlipidemia)   . Hypertension   . Hypothyroidism   . Paroxysmal atrial fibrillation [facility])     Past Surgical History:  Procedure Laterality Date  . BREAST LUMPECTOMY    . CARDIAC CATHETERIZATION    . CHOLECYSTECTOMY    . MASTECTOMY Left   . PACEMAKER INSERTION Left 04/21/2019   Procedure: Pacemaker Change Out;  Surgeon: Cletis Athens, MD;  Location: [facility] ORS;  Service: Cardiovascular;  Laterality: Left;  . STENT PLACEMENT VASCULAR ([facility] HX)      Family History  Problem Relation Age of Onset  . Kidney failure Mother   . Lung cancer Father   . Throat cancer Son     Social History   Socioeconomic History  . Marital status: Divorced    Spouse name: Not on file  . Number of children: Not on file  . Years of education: Not on file  . Highest education level: Not on file  Occupational History  . Not on file  Tobacco Use  . Smoking status: Never Smoker  . Smokeless tobacco: Never Used  Substance and Sexual Activity  . Alcohol use: No    Alcohol/week: 0.0 standard drinks  . Drug use: No  . Sexual activity: Not  Currently  Other Topics Concern  . Not on file  Social History Narrative  . Not on file   Social Determinants of Health   Financial Resource Strain:   . Difficulty of Paying Living Expenses:   Food Insecurity:   . Worried About Charity fundraiser in the Last Year:   . Arboriculturist in the Last Year:   Transportation Needs:   . Film/video editor (Medical):   Marland Kitchen Lack of Transportation (Non-Medical):   Physical Activity:   . Days of Exercise per Week:   . Minutes of Exercise per Session:   Stress:   . Feeling of Stress :   Social Connections:   . Frequency of Communication with Friends and Family:   . Frequency of Social Gatherings with Friends and Family:   . Attends Religious Services:   . Active Member of Clubs or Organizations:   . Attends Archivist Meetings:   Marland Kitchen Marital Status:   Intimate Partner Violence:   . Fear of Current or Ex-Partner:   . Emotionally Abused:   Marland Kitchen Physically Abused:   . Sexually Abused:      Current Outpatient Medications:  .  aspirin EC 81 MG tablet, Take 81 mg by mouth daily.,  Disp: , Rfl:  .  bumetanide (BUMEX) 2 MG tablet, Take 2 mg by mouth daily., Disp: , Rfl:  .  ciprofloxacin (CIPRO) 250 MG tablet, Take 1 tablet (250 mg total) by mouth 2 (two) times daily., Disp: 14 tablet, Rfl: 0 .  clopidogrel (PLAVIX) 75 MG tablet, Take 75 mg by mouth daily., Disp: , Rfl:  .  ferrous sulfate 325 (65 FE) MG tablet, Take by mouth., Disp: , Rfl:  .  insulin aspart protamine- aspart (NOVOLOG MIX 70/30) (70-30) 100 UNIT/ML injection, Inject 0.28 mLs (28 Units total) into the skin 2 (two) times daily with a meal. (Patient taking differently: Inject 50 Units into the skin daily with breakfast. ), Disp: 10 mL, Rfl: 11 .  insulin NPH-regular Human (HUMULIN 70/30) (70-30) 100 UNIT/ML injection, Inject into the skin., Disp: , Rfl:  .  levothyroxine (SYNTHROID) 75 MCG tablet, Take 75 mcg by mouth daily., Disp: , Rfl:  .  lidocaine (LIDODERM) 5 %, ,  Disp: , Rfl:  .  lisinopril (PRINIVIL,ZESTRIL) 20 MG tablet, Take 20 mg by mouth daily., Disp: , Rfl:  .  loperamide (IMODIUM A-D) 2 MG tablet, Take 1 tablet (2 mg total) by mouth as needed for diarrhea or loose stools (2x week)., Disp: 30 tablet, Rfl: 0 .  metoprolol succinate (TOPROL-XL) 50 MG 24 hr tablet, Take 50 mg by mouth 2 (two) times daily. Take with or immediately following a meal., Disp: , Rfl:  .  mirabegron ER (MYRBETRIQ) 25 MG TB24 tablet, Take 1 tablet (25 mg total) by mouth daily., Disp: 30 tablet, Rfl: 11 .  oxyCODONE-acetaminophen (PERCOCET/ROXICET) 5-325 MG tablet, Take 1 tablet by mouth 2 (two) times daily as needed for severe pain., Disp: , Rfl:  .  paricalcitol (ZEMPLAR) 1 MCG capsule, Take 1 mcg by mouth 3 (three) times a week. Pt takes on Monday, Wednesday, and Friday., Disp: , Rfl:  .  Psyllium (METAMUCIL) 28.3 % POWD, Take 28.3 Doses by mouth daily., Disp: 1 Bottle, Rfl: 3 .  ranolazine (RANEXA) 500 MG 12 hr tablet, Take 500 mg by mouth 2 (two) times daily., Disp: , Rfl:  .  rosuvastatin (CRESTOR) 10 MG tablet, Take 10 mg by mouth daily., Disp: , Rfl:  .  sodium bicarbonate 650 MG tablet, Take 650 mg by mouth 2 (two) times daily., Disp: , Rfl:  .  TRUEPLUS INSULIN SYRINGE 29G X 1/2" 1 ML MISC, , Disp: , Rfl:    No Known Allergies  ROS Review of Systems  Constitutional: Negative for chills and fatigue.  HENT: Negative for congestion and nosebleeds.   Eyes: Negative for itching.  Respiratory: Negative for cough.   Cardiovascular: Positive for leg swelling. Negative for chest pain.  Gastrointestinal: Negative for constipation.  Genitourinary: Negative for dysuria.  Musculoskeletal: Negative for arthralgias.  Neurological: Negative for headaches.  Psychiatric/Behavioral: Negative for behavioral problems.      Objective:    Physical Exam  Constitutional: She appears well-developed and well-nourished.  HENT:  Head: Normocephalic and atraumatic.  Eyes: Pupils  are equal, round, and reactive to light.  Cardiovascular: Normal rate.  Pulmonary/Chest:  Lt mastectomy  , pacer is non tender  Musculoskeletal:     Cervical back: Normal range of motion and neck supple.    BP (!) 133/55   Pulse 66  Wt Readings from Last 3 Encounters:  10/25/19 212 lb (96.2 kg)  04/16/19 211 lb (95.7 kg)  04/09/18 221 lb 12.8 oz (100.6 kg)     Health Maintenance  Due  Topic Date Due  . FOOT EXAM  Never done  . OPHTHALMOLOGY EXAM  Never done  . COVID-19 Vaccine (1) Never done  . TETANUS/TDAP  Never done  . PNA vac Low Risk Adult (1 of 2 - PCV13) Never done  . HEMOGLOBIN A1C  12/25/2016    There are no preventive care reminders to display for this patient.  Lab Results  Component Value Date   TSH 4.64 (H) 02/03/2014   Lab Results  Component Value Date   WBC 7.3 08/11/2019   HGB 11.0 (L) 08/11/2019   HCT 33.8 (L) 08/11/2019   MCV 90.6 08/11/2019   PLT 242 08/11/2019   Lab Results  Component Value Date   NA 139 08/11/2019   K 4.3 08/11/2019   CO2 25 08/11/2019   GLUCOSE 170 (H) 08/11/2019   BUN 25 (H) 08/11/2019   CREATININE 1.58 (H) 08/11/2019   BILITOT 0.5 08/28/2016   ALKPHOS 51 08/28/2016   AST 26 08/28/2016   ALT 11 (L) 08/28/2016   PROT 6.2 (L) 08/28/2016   ALBUMIN 3.6 08/11/2019   CALCIUM 8.9 08/11/2019   ANIONGAP 12 08/11/2019   Lab Results  Component Value Date   CHOL 105 07/05/2012   Lab Results  Component Value Date   HDL 56 07/05/2012   Lab Results  Component Value Date   LDLCALC 10 07/05/2012   Lab Results  Component Value Date   TRIG 193 07/05/2012   No results found for: CHOLHDL Lab Results  Component Value Date   HGBA1C 6.7 (H) 06/27/2016      Assessment & Plan:   Problem List Items Addressed This Visit      Cardiovascular and Mediastinum   Sick sinus syndrome due to SA node dysfunction (HCC)     Immune and Lymphatic   Breast cancer metastasized to axillary lymph node, left (HCC)     Other    Cardiac pacemaker in situ - Primary   Relevant Orders   PACEMAKER IN CLINIC CHECK (Completed)     Note: Medical Device Follow-up  Patient pacemaker was interrogated by pacemakers analyzer, battery status is okay.  No programming changes were indicated after the review of the data.  Histogram shows no change since the last interrogation Atrial and ventricular sensing thresholds were found to be acceptable Impedance was checked and it was found to be normal.  Thresholds were found to be okay on evaluation of rhythm problem.  No high rate or low rate arrhythmia were noted.  Estimated battery longevity is 11 months. I have personally reviewed the device data and amended the report as necessary.  No orders of the defined types were placed in this encounter.   Follow-up: Return in about 3 months (around 03/17/2020).    Cletis Athens, MD

## 2019-12-27 ENCOUNTER — Encounter: Payer: Self-pay | Admitting: Urology

## 2019-12-27 ENCOUNTER — Other Ambulatory Visit: Payer: Self-pay

## 2019-12-27 ENCOUNTER — Ambulatory Visit (INDEPENDENT_AMBULATORY_CARE_PROVIDER_SITE_OTHER): Payer: Medicare Other | Admitting: Urology

## 2019-12-27 VITALS — BP 94/63 | HR 61

## 2019-12-27 DIAGNOSIS — N3946 Mixed incontinence: Secondary | ICD-10-CM | POA: Diagnosis not present

## 2019-12-27 NOTE — Progress Notes (Signed)
12/27/2019 2:40 PM   Regina [name] 11-Sep-1933 [id number]  Referring provider: Donnie Coffin, MD [place],  [place] [address]  Chief Complaint  Patient presents with  . Urinary Incontinence    HPI: I was consulted to assess the patient's frequency and urge incontinence.  She can soak 5 pads a [place].  She can void every 10 minutes.  She cannot sit through a 2-hour movie.  She has no enuresis.  She gets up 2-3 times a night.  She utilizes a walker.  She describes her recent bladder infection.  She has chronic renal insufficiency.  There was one recent positive culture in the chart  She did not want me to examine her and she could not leave a urine sample  Patient has urge incontinence and frequency.  She has medical comorbidities.  It may be difficult to reach her treatment goal.  She was reluctant to be examined and I will try to get a urine sample next time sent for culture.  I will reassess her on Myrbetriq 25 mg samples and prescription in 6 weeks  I also suggested that we could empirically treat her for a bladder infection since the last 3 days she is feeling more pressure.  I found the conversation became quite circular and she tends to control her wishes and was less accepting of the solutions.  We finally had her void into a hat and we sent her for culture.  I will call with the culture results.  I gave her Myrbetriq samples and prescription reassess 6 weeks  Patient request we called in ciprofloxacin 250 mg twice a [place] for 1 week.  I was teasing her about her hat  Today Frequency is stable.  Last culture positive.  Clinically much better off all medication.  I think the bladder infection was the main cause of her incontinence.  Little bit of foot the floor syndrome but she chose watchful waiting.    PMH: Past Medical History:  Diagnosis Date  . Breast cancer, left [facility]) 2013   Mastectomy.   . CAD (coronary artery disease)    s/p cath in 2009  with 2 stents  . Diabetes mellitus without complication ([place])   . GERD (gastroesophageal reflux disease)   . HLD (hyperlipidemia)   . Hypertension   . Hypothyroidism   . Paroxysmal atrial fibrillation [facility])     Surgical History: Past Surgical History:  Procedure Laterality Date  . BREAST LUMPECTOMY    . CARDIAC CATHETERIZATION    . CHOLECYSTECTOMY    . MASTECTOMY Left   . PACEMAKER INSERTION Left 04/21/2019   Procedure: Pacemaker Change Out;  Surgeon: Cletis Athens, MD;  Location: [facility] ORS;  Service: Cardiovascular;  Laterality: Left;  . STENT PLACEMENT VASCULAR ([facility] HX)      Home Medications:  Allergies as of 12/27/2019   No Known Allergies     Medication List       Accurate as of Dec 27, 2019  2:40 PM. If you have any questions, ask your nurse or doctor.        aspirin EC 81 MG tablet Take 81 mg by mouth daily.   bumetanide 2 MG tablet Commonly known as: BUMEX Take 2 mg by mouth daily.   ciprofloxacin 250 MG tablet Commonly known as: Cipro Take 1 tablet (250 mg total) by mouth 2 (two) times daily.   clopidogrel 75 MG tablet Commonly known as: PLAVIX Take 75 mg by mouth daily.  ferrous sulfate 325 (65 FE) MG tablet Take by mouth.   HumuLIN 70/30 (70-30) 100 UNIT/ML injection Generic drug: insulin NPH-regular Human Inject into the skin.   insulin aspart protamine- aspart (70-30) 100 UNIT/ML injection Commonly known as: NOVOLOG MIX 70/30 Inject 0.28 mLs (28 Units total) into the skin 2 (two) times daily with a meal. What changed:   how much to take  when to take this   levothyroxine 75 MCG tablet Commonly known as: SYNTHROID Take 75 mcg by mouth daily.   lidocaine 5 % Commonly known as: LIDODERM   lisinopril 20 MG tablet Commonly known as: ZESTRIL Take 20 mg by mouth daily.   loperamide 2 MG tablet Commonly known as: Imodium A-D Take 1 tablet (2 mg total) by mouth as needed for diarrhea or loose stools (2x week).   metoprolol succinate 50  MG 24 hr tablet Commonly known as: TOPROL-XL Take 50 mg by mouth 2 (two) times daily. Take with or immediately following a meal.   mirabegron ER 25 MG Tb24 tablet Commonly known as: MYRBETRIQ Take 1 tablet (25 mg total) by mouth daily.   oxyCODONE-acetaminophen 5-325 MG tablet Commonly known as: PERCOCET/ROXICET Take 1 tablet by mouth 2 (two) times daily as needed for severe pain.   paricalcitol 1 MCG capsule Commonly known as: ZEMPLAR Take 1 mcg by mouth 3 (three) times a week. Pt takes on Monday, Wednesday, and Friday.   Psyllium 28.3 % Powd Commonly known as: Metamucil Take 28.3 Doses by mouth daily.   ranolazine 500 MG 12 hr tablet Commonly known as: RANEXA Take 500 mg by mouth 2 (two) times daily.   rosuvastatin 10 MG tablet Commonly known as: CRESTOR Take 10 mg by mouth daily.   sodium bicarbonate 650 MG tablet Take 650 mg by mouth 2 (two) times daily.   TRUEplus Insulin Syringe 29G X 1/2" 1 ML Misc Generic drug: INSULIN SYRINGE 1CC/29G       Allergies: No Known Allergies  Family History: Family History  Problem Relation Age of Onset  . Kidney failure Mother   . Lung cancer Father   . Throat cancer Son     Social History:  reports that she has never smoked. She has never used smokeless tobacco. She reports that she does not drink alcohol or use drugs.  ROS:                                        Physical Exam: BP 94/63   Pulse 61   Constitutional:  Alert and oriented, No acute distress. HEENT: [place] AT, moist mucus membranes.  Trachea midline, no masses. Cardiovascular: No clubbing, cyanosis, or edema.  Laboratory Data: Lab Results  Component Value Date   WBC 7.3 08/11/2019   HGB 11.0 (L) 08/11/2019   HCT 33.8 (L) 08/11/2019   MCV 90.6 08/11/2019   PLT 242 08/11/2019    Lab Results  Component Value Date   CREATININE 1.58 (H) 08/11/2019    No results found for: PSA  No results found for: TESTOSTERONE  Lab Results    Component Value Date   HGBA1C 6.7 (H) 06/27/2016    Urinalysis    Component Value Date/Time   COLORURINE AMBER (A) 08/11/2019 1201   APPEARANCEUR Cloudy (A) 10/25/2019 1533   LABSPEC 1.018 08/11/2019 1201   LABSPEC 1.008 07/03/2012 1616   PHURINE 6.0 08/11/2019 1201   GLUCOSEU Negative 10/25/2019 1533  GLUCOSEU Negative 07/03/2012 [place] 08/11/2019 1201   BILIRUBINUR Negative 10/25/2019 1533   BILIRUBINUR Negative 07/03/2012 [place] 08/11/2019 1201   PROTEINUR 2+ (A) 10/25/2019 1533   PROTEINUR 100 (A) 08/11/2019 1201   NITRITE Negative 10/25/2019 1533   NITRITE POSITIVE (A) 08/11/2019 1201   LEUKOCYTESUR 2+ (A) 10/25/2019 1533   LEUKOCYTESUR LARGE (A) 08/11/2019 1201   LEUKOCYTESUR Trace 07/03/2012 1616    Pertinent Imaging:   Assessment & Plan: See as needed.  Call office if incontinence worsens to rule out bladder infection  There are no diagnoses linked to this encounter.  No follow-ups on file.  Reece Packer, MD  [place] [address], [place], [place] [address] [phone]

## 2020-03-16 ENCOUNTER — Other Ambulatory Visit: Payer: Self-pay

## 2020-03-16 ENCOUNTER — Ambulatory Visit (INDEPENDENT_AMBULATORY_CARE_PROVIDER_SITE_OTHER): Payer: Medicare Other | Admitting: Internal Medicine

## 2020-03-16 DIAGNOSIS — I10 Essential (primary) hypertension: Secondary | ICD-10-CM

## 2020-03-16 DIAGNOSIS — Z853 Personal history of malignant neoplasm of breast: Secondary | ICD-10-CM

## 2020-03-16 DIAGNOSIS — I495 Sick sinus syndrome: Secondary | ICD-10-CM | POA: Diagnosis not present

## 2020-03-16 DIAGNOSIS — Z950 Presence of cardiac pacemaker: Secondary | ICD-10-CM | POA: Diagnosis not present

## 2020-03-16 NOTE — Assessment & Plan Note (Signed)
Patient is on metoprolol and ace inhibitor

## 2020-03-16 NOTE — Assessment & Plan Note (Signed)
Patient has some evidence of atrial arrythmia on the pacer check but she is not a candidate for any anticoagulation therapy.

## 2020-03-16 NOTE — Assessment & Plan Note (Signed)
Pacemaker is functioning well. Please see report.  

## 2020-03-16 NOTE — Assessment & Plan Note (Signed)
Hx of left mastectomy. Stable at the present time.

## 2020-03-16 NOTE — Progress Notes (Signed)
Established Patient Office Visit  SUBJECTIVE:  Subjective  Patient ID: Regina [name], female    DOB: 04-29-1934  Age: 86 y.o. MRN: [id number]  CC:  Chief Complaint  Patient presents with  . Pacemaker Check    HPI Regina [name] is a 86 y.o. female presenting today for a pacemaker check.  Patient pacemaker was interrogated by pacemakers analyzer, battery status is okay.  No programming changes were indicated after the review of the data.  Histogram shows no change since the last interrogation The Right Atrial and ventricular sensing thresholds were found to be high but acceptable. Impedance was checked and it was found to be normal.  Thresholds were found to be okay on evaluation of rhythm problem. 14 atrial fibrillation episodes since 02/09/2020, the longest of which was 24 minute.  No high rate or low rate arrhythmia were noted.  Estimated battery longevity is 9 years. I have personally reviewed the device data and amended the report as necessary.   She notes that she fell last week and her back is still hurting as a result of that. She said she just lost her balance. She denies hip pain. She has difficulty sitting down and standing up due to the pain. She declines an xray of the back at this time, but was advised to come back if her pain worsened or went unchanged.     Past Medical History:  Diagnosis Date  . Breast cancer, left [facility]) 2013   Mastectomy.   . CAD (coronary artery disease)    s/p cath in 2009 with 2 stents  . Diabetes mellitus without complication ([place])   . GERD (gastroesophageal reflux disease)   . HLD (hyperlipidemia)   . Hypertension   . Hypothyroidism   . Paroxysmal atrial fibrillation [facility])     Past Surgical History:  Procedure Laterality Date  . BREAST LUMPECTOMY    . CARDIAC CATHETERIZATION    . CHOLECYSTECTOMY    . MASTECTOMY Left   . PACEMAKER INSERTION Left 04/21/2019   Procedure: Pacemaker Change Out;  Surgeon: Cletis Athens, MD;  Location: [facility]  ORS;  Service: Cardiovascular;  Laterality: Left;  . STENT PLACEMENT VASCULAR ([facility] HX)      Family History  Problem Relation Age of Onset  . Kidney failure Mother   . Lung cancer Father   . Throat cancer Son     Social History   Socioeconomic History  . Marital status: Divorced    Spouse name: Not on file  . Number of children: Not on file  . Years of education: Not on file  . Highest education level: Not on file  Occupational History  . Not on file  Tobacco Use  . Smoking status: Never Smoker  . Smokeless tobacco: Never Used  Substance and Sexual Activity  . Alcohol use: No    Alcohol/week: 0.0 standard drinks  . Drug use: No  . Sexual activity: Not Currently  Other Topics Concern  . Not on file  Social History Narrative  . Not on file   Social Determinants of Health   Financial Resource Strain:   . Difficulty of Paying Living Expenses:   Food Insecurity:   . Worried About Charity fundraiser in the Last Year:   . Arboriculturist in the Last Year:   Transportation Needs:   . Film/video editor (Medical):   Marland Kitchen Lack of Transportation (Non-Medical):   Physical Activity:   . Days of Exercise per Week:   .  Minutes of Exercise per Session:   Stress:   . Feeling of Stress :   Social Connections:   . Frequency of Communication with Friends and Family:   . Frequency of Social Gatherings with Friends and Family:   . Attends Religious Services:   . Active Member of Clubs or Organizations:   . Attends Archivist Meetings:   Marland Kitchen Marital Status:   Intimate Partner Violence:   . Fear of Current or Ex-Partner:   . Emotionally Abused:   Marland Kitchen Physically Abused:   . Sexually Abused:      Current Outpatient Medications:  .  aspirin EC 81 MG tablet, Take 81 mg by mouth daily., Disp: , Rfl:  .  bumetanide (BUMEX) 2 MG tablet, Take 2 mg by mouth daily., Disp: , Rfl:  .  ciprofloxacin (CIPRO) 250 MG tablet, Take 1 tablet (250 mg total) by mouth 2 (two) times  daily., Disp: 14 tablet, Rfl: 0 .  clopidogrel (PLAVIX) 75 MG tablet, Take 75 mg by mouth daily., Disp: , Rfl:  .  ferrous sulfate 325 (65 FE) MG tablet, Take by mouth., Disp: , Rfl:  .  insulin aspart protamine- aspart (NOVOLOG MIX 70/30) (70-30) 100 UNIT/ML injection, Inject 0.28 mLs (28 Units total) into the skin 2 (two) times daily with a meal. (Patient taking differently: Inject 50 Units into the skin daily with breakfast. ), Disp: 10 mL, Rfl: 11 .  insulin NPH-regular Human (HUMULIN 70/30) (70-30) 100 UNIT/ML injection, Inject into the skin., Disp: , Rfl:  .  levothyroxine (SYNTHROID) 75 MCG tablet, Take 75 mcg by mouth daily., Disp: , Rfl:  .  lidocaine (LIDODERM) 5 %, , Disp: , Rfl:  .  lisinopril (PRINIVIL,ZESTRIL) 20 MG tablet, Take 20 mg by mouth daily., Disp: , Rfl:  .  loperamide (IMODIUM A-D) 2 MG tablet, Take 1 tablet (2 mg total) by mouth as needed for diarrhea or loose stools (2x week)., Disp: 30 tablet, Rfl: 0 .  metoprolol succinate (TOPROL-XL) 50 MG 24 hr tablet, Take 50 mg by mouth 2 (two) times daily. Take with or immediately following a meal., Disp: , Rfl:  .  mirabegron ER (MYRBETRIQ) 25 MG TB24 tablet, Take 1 tablet (25 mg total) by mouth daily., Disp: 30 tablet, Rfl: 11 .  oxyCODONE-acetaminophen (PERCOCET/ROXICET) 5-325 MG tablet, Take 1 tablet by mouth 2 (two) times daily as needed for severe pain., Disp: , Rfl:  .  paricalcitol (ZEMPLAR) 1 MCG capsule, Take 1 mcg by mouth 3 (three) times a week. Pt takes on Monday, Wednesday, and Friday., Disp: , Rfl:  .  Psyllium (METAMUCIL) 28.3 % POWD, Take 28.3 Doses by mouth daily., Disp: 1 Bottle, Rfl: 3 .  ranolazine (RANEXA) 500 MG 12 hr tablet, Take 500 mg by mouth 2 (two) times daily., Disp: , Rfl:  .  rosuvastatin (CRESTOR) 10 MG tablet, Take 10 mg by mouth daily., Disp: , Rfl:  .  sodium bicarbonate 650 MG tablet, Take 650 mg by mouth 2 (two) times daily., Disp: , Rfl:  .  TRUEPLUS INSULIN SYRINGE 29G X 1/2" 1 ML MISC, ,  Disp: , Rfl:    No Known Allergies  ROS Review of Systems  Constitutional: Negative.   HENT: Negative.   Eyes: Negative.   Respiratory: Negative.   Cardiovascular: Negative.   Gastrointestinal: Negative.   Endocrine: Negative.   Genitourinary: Negative.   Musculoskeletal: Positive for back pain.  Skin: Negative.   Allergic/Immunologic: Negative.   Neurological: Negative.  Negative for  syncope.  Hematological: Negative.   Psychiatric/Behavioral: Negative.   All other systems reviewed and are negative.    OBJECTIVE:    Physical Exam Vitals reviewed.  Constitutional:      Appearance: Normal appearance. She is obese.     Comments: Ambulating with a walker  HENT:     Mouth/Throat:     Mouth: Mucous membranes are moist.  Eyes:     Pupils: Pupils are equal, round, and reactive to light.  Neck:     Vascular: No carotid bruit.  Cardiovascular:     Rate and Rhythm: Normal rate and regular rhythm.     Pulses: Normal pulses.     Heart sounds: Normal heart sounds.  Pulmonary:     Effort: Pulmonary effort is normal.     Breath sounds: Normal breath sounds.  Chest:     Breasts:        Right: Normal.        Left: Absent.  Abdominal:     General: Bowel sounds are normal.     Palpations: Abdomen is soft. There is no hepatomegaly, splenomegaly or mass.     Tenderness: There is no abdominal tenderness.     Hernia: No hernia is present.  Musculoskeletal:        General: No tenderness.     Cervical back: Neck supple.     Right lower leg: No edema.     Left lower leg: No edema.  Skin:    Coloration: Skin is pale.     Findings: No rash.  Neurological:     Mental Status: She is alert and oriented to person, place, and time.     Motor: No weakness.  Psychiatric:        Mood and Affect: Mood and affect normal.        Behavior: Behavior normal.     There were no vitals taken for this visit. Wt Readings from Last 3 Encounters:  10/25/19 212 lb (96.2 kg)  04/16/19 211 lb  (95.7 kg)  04/09/18 221 lb 12.8 oz (100.6 kg)    Health Maintenance Due  Topic Date Due  . FOOT EXAM  Never done  . OPHTHALMOLOGY EXAM  Never done  . COVID-19 Vaccine (1) Never done  . TETANUS/TDAP  Never done  . PNA vac Low Risk Adult (1 of 2 - PCV13) Never done  . HEMOGLOBIN A1C  12/25/2016  . INFLUENZA VACCINE  03/12/2020    There are no preventive care reminders to display for this patient.  CBC Latest Ref Rng & Units 08/11/2019 04/16/2019 09/26/2016  WBC 4.0 - 10.5 K/uL 7.3 10.1 7.4  Hemoglobin 12.0 - 15.0 g/dL 11.0(L) 10.8(L) 9.8(L)  Hematocrit 36 - 46 % 33.8(L) 33.5(L) 29.4(L)  Platelets 150 - 400 K/uL 242 244 180   CMP Latest Ref Rng & Units 08/11/2019 04/16/2019 09/26/2016  Glucose 70 - 99 mg/dL 170(H) 146(H) 144(H)  BUN 8 - 23 mg/dL 25(H) 23 33(H)  Creatinine 0.44 - 1.00 mg/dL 1.58(H) 1.39(H) 1.71(H)  Sodium 135 - 145 mmol/L 139 138 136  Potassium 3.5 - 5.1 mmol/L 4.3 3.6 5.1  Chloride 98 - 111 mmol/L 102 101 108  CO2 22 - 32 mmol/L 25 25 21(L)  Calcium 8.9 - 10.3 mg/dL 8.9 8.9 7.8(L)  Total Protein 6.5 - 8.1 g/dL - - -  Total Bilirubin 0.3 - 1.2 mg/dL - - -  Alkaline Phos 38 - 126 U/L - - -  AST 15 - 41 U/L - - -  ALT 14 - 54 U/L - - -    Lab Results  Component Value Date   TSH 4.64 (H) 02/03/2014   Lab Results  Component Value Date   ALBUMIN 3.6 08/11/2019   ANIONGAP 12 08/11/2019   Lab Results  Component Value Date   CHOL 105 07/05/2012   HDL 56 07/05/2012   LDLCALC 10 07/05/2012   Lab Results  Component Value Date   TRIG 193 07/05/2012   Lab Results  Component Value Date   HGBA1C 6.7 (H) 06/27/2016   HGBA1C 6.6 (H) 02/20/2015   HGBA1C 7.5 (H) 02/03/2014      ASSESSMENT & PLAN:   Problem List Items Addressed This Visit      Cardiovascular and Mediastinum   HTN (hypertension) (Chronic)    Patient is on metoprolol and ace inhibitor       Sick sinus syndrome due to SA node dysfunction [facility])    Patient has some evidence of atrial  arrythmia on the pacer check but she is not a candidate for any anticoagulation therapy.         Other   Cardiac pacemaker in situ - Primary    Pacemaker is functioning well. Please see report.      Relevant Orders   PACEMAKER IN CLINIC CHECK (Completed)   History of cancer of left breast    Hx of left mastectomy. Stable at the present time.          No orders of the defined types were placed in this encounter.   Follow-up: No follow-ups on file.    Dr. Jane Canary [facility] [address], [place], [place] [address]   By signing my name below, I, [employer], attest that this documentation has been prepared under the direction and in the presence of Cletis Athens, MD. Electronically Signed: Cletis Athens, MD 03/16/20, 4:53 PM   I personally performed the services described in this documentation, which was SCRIBED in my presence. The recorded information has been reviewed and considered accurate. It has been edited as necessary during review. Cletis Athens, MD

## 2020-06-15 ENCOUNTER — Ambulatory Visit (INDEPENDENT_AMBULATORY_CARE_PROVIDER_SITE_OTHER): Payer: Medicare Other | Admitting: Internal Medicine

## 2020-06-15 VITALS — BP 143/72 | HR 81

## 2020-06-15 DIAGNOSIS — I480 Paroxysmal atrial fibrillation: Secondary | ICD-10-CM

## 2020-06-15 DIAGNOSIS — I10 Essential (primary) hypertension: Secondary | ICD-10-CM

## 2020-06-15 DIAGNOSIS — Z950 Presence of cardiac pacemaker: Secondary | ICD-10-CM | POA: Diagnosis not present

## 2020-06-15 NOTE — Assessment & Plan Note (Signed)
Patient has paroxysmal atrial fibrillation.  But she is not a candidate for anticoagulation because of history of GI bleeding.  Right ventricular lead threshold is high since the last visit.

## 2020-06-15 NOTE — Progress Notes (Signed)
Established Patient Office Visit  Subjective:  Patient ID: Regina [name], female    DOB: November 16, 1933  Age: 86 y.o. MRN: [id number]  CC:  Chief Complaint  Patient presents with  . Pacemaker Check    HPI   Regina [name] presents for her pacer check  Past Medical History:  Diagnosis Date  . Breast cancer, left [facility]) 2013   Mastectomy.   . CAD (coronary artery disease)    s/p cath in 2009 with 2 stents  . Diabetes mellitus without complication ([place])   . GERD (gastroesophageal reflux disease)   . HLD (hyperlipidemia)   . Hypertension   . Hypothyroidism   . Paroxysmal atrial fibrillation [facility])     Past Surgical History:  Procedure Laterality Date  . BREAST LUMPECTOMY    . CARDIAC CATHETERIZATION    . CHOLECYSTECTOMY    . MASTECTOMY Left   . PACEMAKER INSERTION Left 04/21/2019   Procedure: Pacemaker Change Out;  Surgeon: Cletis Athens, MD;  Location: [facility] ORS;  Service: Cardiovascular;  Laterality: Left;  . STENT PLACEMENT VASCULAR ([facility] HX)      Family History  Problem Relation Age of Onset  . Kidney failure Mother   . Lung cancer Father   . Throat cancer Son     Social History   Socioeconomic History  . Marital status: Divorced    Spouse name: Not on file  . Number of children: Not on file  . Years of education: Not on file  . Highest education level: Not on file  Occupational History  . Not on file  Tobacco Use  . Smoking status: Never Smoker  . Smokeless tobacco: Never Used  Substance and Sexual Activity  . Alcohol use: No    Alcohol/week: 0.0 standard drinks  . Drug use: No  . Sexual activity: Not Currently  Other Topics Concern  . Not on file  Social History Narrative  . Not on file   Social Determinants of Health   Financial Resource Strain:   . Difficulty of Paying Living Expenses: Not on file  Food Insecurity:   . Worried About Charity fundraiser in the Last Year: Not on file  . Ran Out of Food in the Last Year: Not on file    Transportation Needs:   . Lack of Transportation (Medical): Not on file  . Lack of Transportation (Non-Medical): Not on file  Physical Activity:   . Days of Exercise per Week: Not on file  . Minutes of Exercise per Session: Not on file  Stress:   . Feeling of Stress : Not on file  Social Connections:   . Frequency of Communication with Friends and Family: Not on file  . Frequency of Social Gatherings with Friends and Family: Not on file  . Attends Religious Services: Not on file  . Active Member of Clubs or Organizations: Not on file  . Attends Archivist Meetings: Not on file  . Marital Status: Not on file  Intimate Partner Violence:   . Fear of Current or Ex-Partner: Not on file  . Emotionally Abused: Not on file  . Physically Abused: Not on file  . Sexually Abused: Not on file     Current Outpatient Medications:  .  aspirin EC 81 MG tablet, Take 81 mg by mouth daily., Disp: , Rfl:  .  bumetanide (BUMEX) 2 MG tablet, Take 2 mg by mouth daily., Disp: , Rfl:  .  ciprofloxacin (CIPRO) 250 MG tablet, Take 1 tablet (  250 mg total) by mouth 2 (two) times daily., Disp: 14 tablet, Rfl: 0 .  clopidogrel (PLAVIX) 75 MG tablet, Take 75 mg by mouth daily., Disp: , Rfl:  .  ferrous sulfate 325 (65 FE) MG tablet, Take by mouth., Disp: , Rfl:  .  insulin aspart protamine- aspart (NOVOLOG MIX 70/30) (70-30) 100 UNIT/ML injection, Inject 0.28 mLs (28 Units total) into the skin 2 (two) times daily with a meal. (Patient taking differently: Inject 50 Units into the skin daily with breakfast. ), Disp: 10 mL, Rfl: 11 .  insulin NPH-regular Human (HUMULIN 70/30) (70-30) 100 UNIT/ML injection, Inject into the skin., Disp: , Rfl:  .  levothyroxine (SYNTHROID) 75 MCG tablet, Take 75 mcg by mouth daily., Disp: , Rfl:  .  lidocaine (LIDODERM) 5 %, , Disp: , Rfl:  .  lisinopril (PRINIVIL,ZESTRIL) 20 MG tablet, Take 20 mg by mouth daily., Disp: , Rfl:  .  loperamide (IMODIUM A-D) 2 MG tablet, Take 1  tablet (2 mg total) by mouth as needed for diarrhea or loose stools (2x week)., Disp: 30 tablet, Rfl: 0 .  metoprolol succinate (TOPROL-XL) 50 MG 24 hr tablet, Take 50 mg by mouth 2 (two) times daily. Take with or immediately following a meal., Disp: , Rfl:  .  mirabegron ER (MYRBETRIQ) 25 MG TB24 tablet, Take 1 tablet (25 mg total) by mouth daily., Disp: 30 tablet, Rfl: 11 .  oxyCODONE-acetaminophen (PERCOCET/ROXICET) 5-325 MG tablet, Take 1 tablet by mouth 2 (two) times daily as needed for severe pain., Disp: , Rfl:  .  paricalcitol (ZEMPLAR) 1 MCG capsule, Take 1 mcg by mouth 3 (three) times a week. Pt takes on Monday, Wednesday, and Friday., Disp: , Rfl:  .  Psyllium (METAMUCIL) 28.3 % POWD, Take 28.3 Doses by mouth daily., Disp: 1 Bottle, Rfl: 3 .  ranolazine (RANEXA) 500 MG 12 hr tablet, Take 500 mg by mouth 2 (two) times daily., Disp: , Rfl:  .  rosuvastatin (CRESTOR) 10 MG tablet, Take 10 mg by mouth daily., Disp: , Rfl:  .  sodium bicarbonate 650 MG tablet, Take 650 mg by mouth 2 (two) times daily., Disp: , Rfl:  .  TRUEPLUS INSULIN SYRINGE 29G X 1/2" 1 ML MISC, , Disp: , Rfl:    No Known Allergies  ROS Review of Systems    Objective:    Physical Exam  BP (!) 143/72   Pulse 81  Wt Readings from Last 3 Encounters:  10/25/19 212 lb (96.2 kg)  04/16/19 211 lb (95.7 kg)  04/09/18 221 lb 12.8 oz (100.6 kg)     Health Maintenance Due  Topic Date Due  . FOOT EXAM  Never done  . OPHTHALMOLOGY EXAM  Never done  . COVID-19 Vaccine (1) Never done  . TETANUS/TDAP  Never done  . PNA vac Low Risk Adult (1 of 2 - PCV13) Never done  . HEMOGLOBIN A1C  12/25/2016  . INFLUENZA VACCINE  Never done    There are no preventive care reminders to display for this patient.  Lab Results  Component Value Date   TSH 4.64 (H) 02/03/2014   Lab Results  Component Value Date   WBC 7.3 08/11/2019   HGB 11.0 (L) 08/11/2019   HCT 33.8 (L) 08/11/2019   MCV 90.6 08/11/2019   PLT 242  08/11/2019   Lab Results  Component Value Date   NA 139 08/11/2019   K 4.3 08/11/2019   CO2 25 08/11/2019   GLUCOSE 170 (H) 08/11/2019  BUN 25 (H) 08/11/2019   CREATININE 1.58 (H) 08/11/2019   BILITOT 0.5 08/28/2016   ALKPHOS 51 08/28/2016   AST 26 08/28/2016   ALT 11 (L) 08/28/2016   PROT 6.2 (L) 08/28/2016   ALBUMIN 3.6 08/11/2019   CALCIUM 8.9 08/11/2019   ANIONGAP 12 08/11/2019   Lab Results  Component Value Date   CHOL 105 07/05/2012   Lab Results  Component Value Date   HDL 56 07/05/2012   Lab Results  Component Value Date   LDLCALC 10 07/05/2012   Lab Results  Component Value Date   TRIG 193 07/05/2012   No results found for: CHOLHDL Lab Results  Component Value Date   HGBA1C 6.7 (H) 06/27/2016      Assessment & Plan:   Problem List Items Addressed This Visit      Cardiovascular and Mediastinum   Paroxysmal atrial fibrillation ([place]) (Chronic)    Patient has paroxysmal atrial fibrillation.  But she is not a candidate for anticoagulation because of history of GI bleeding.  Right ventricular lead threshold is high since the last visit.      Essential hypertension    Blood pressure stable on present medication.        Other   Cardiac pacemaker in situ - Primary    Pacemaker was checked and it was found to be functioning well.  Patient is not a candidate for anticoagulation.  Right ventricular lead threshold is high.      Relevant Orders   PACEMAKER IN CLINIC CHECK     Patient pacemaker was interrogated by pacemakers analyzer, battery status is okay.  No programming changes were indicated after the review of the data.  Histogram shows no change since the last interrogation Atrial and ventricular sensing thresholds were found to be acceptable Impedance was checked and it was found to be normal.  Thresholds were found to be okay on evaluation of rhythm problem.  No high rate or low rate arrhythmia were noted.  Estimated battery longevity is 10 yr. I  have personally reviewed the device data and amended the report as necessary.  No orders of the defined types were placed in this encounter.   Follow-up: No follow-ups on file.    Cletis Athens, MD

## 2020-06-15 NOTE — Assessment & Plan Note (Signed)
Pacemaker was checked and it was found to be functioning well.  Patient is not a candidate for anticoagulation.  Right ventricular lead threshold is high.

## 2020-06-15 NOTE — Assessment & Plan Note (Signed)
Blood pressure stable on present medication. 

## 2020-09-18 ENCOUNTER — Ambulatory Visit (INDEPENDENT_AMBULATORY_CARE_PROVIDER_SITE_OTHER): Payer: Medicare Other | Admitting: Internal Medicine

## 2020-09-18 ENCOUNTER — Other Ambulatory Visit: Payer: Self-pay

## 2020-09-18 VITALS — BP 110/52 | HR 66

## 2020-09-18 DIAGNOSIS — E1121 Type 2 diabetes mellitus with diabetic nephropathy: Secondary | ICD-10-CM | POA: Diagnosis not present

## 2020-09-18 DIAGNOSIS — I495 Sick sinus syndrome: Secondary | ICD-10-CM

## 2020-09-18 DIAGNOSIS — I480 Paroxysmal atrial fibrillation: Secondary | ICD-10-CM

## 2020-09-18 DIAGNOSIS — Z950 Presence of cardiac pacemaker: Secondary | ICD-10-CM | POA: Diagnosis not present

## 2020-09-18 DIAGNOSIS — I10 Essential (primary) hypertension: Secondary | ICD-10-CM | POA: Diagnosis not present

## 2020-09-23 NOTE — Assessment & Plan Note (Signed)
Blood pressure is stable at the present time.  Patient was advised to continue her present medication try to lose weight.

## 2020-09-23 NOTE — Assessment & Plan Note (Signed)
Patient continues to work well

## 2020-09-23 NOTE — Progress Notes (Signed)
Established Patient Office Visit  Subjective:  Patient ID: Regina [name], female    DOB: April 15, 1934  Age: 87 y.o. MRN: [id number]  CC: No chief complaint on file.   HPI  Regina [name] presents for pacemaker check.  Patient has a problem with obesity had a breast cancer on the left side she has had paroxysmal atrial fibrillation for anticoagulation because of the bleeding and anemia in the past.  She does not give any episode of TIA or stroke.  Past Medical History:  Diagnosis Date  . Breast cancer, left [facility]) 2013   Mastectomy.   . CAD (coronary artery disease)    s/p cath in 2009 with 2 stents  . Diabetes mellitus without complication ([place])   . GERD (gastroesophageal reflux disease)   . HLD (hyperlipidemia)   . Hypertension   . Hypothyroidism   . Paroxysmal atrial fibrillation [facility])     Past Surgical History:  Procedure Laterality Date  . BREAST LUMPECTOMY    . CARDIAC CATHETERIZATION    . CHOLECYSTECTOMY    . MASTECTOMY Left   . PACEMAKER INSERTION Left 04/21/2019   Procedure: Pacemaker Change Out;  Surgeon: Cletis Athens, MD;  Location: [facility] ORS;  Service: Cardiovascular;  Laterality: Left;  . STENT PLACEMENT VASCULAR ([facility] HX)      Family History  Problem Relation Age of Onset  . Kidney failure Mother   . Lung cancer Father   . Throat cancer Son     Social History   Socioeconomic History  . Marital status: Divorced    Spouse name: Not on file  . Number of children: Not on file  . Years of education: Not on file  . Highest education level: Not on file  Occupational History  . Not on file  Tobacco Use  . Smoking status: Never Smoker  . Smokeless tobacco: Never Used  Substance and Sexual Activity  . Alcohol use: No    Alcohol/week: 0.0 standard drinks  . Drug use: No  . Sexual activity: Not Currently  Other Topics Concern  . Not on file  Social History Narrative  . Not on file   Social Determinants of Health   Financial Resource Strain: Not on  file  Food Insecurity: Not on file  Transportation Needs: Not on file  Physical Activity: Not on file  Stress: Not on file  Social Connections: Not on file  Intimate Partner Violence: Not on file     Current Outpatient Medications:  .  aspirin EC 81 MG tablet, Take 81 mg by mouth daily., Disp: , Rfl:  .  bumetanide (BUMEX) 2 MG tablet, Take 2 mg by mouth daily., Disp: , Rfl:  .  ciprofloxacin (CIPRO) 250 MG tablet, Take 1 tablet (250 mg total) by mouth 2 (two) times daily., Disp: 14 tablet, Rfl: 0 .  clopidogrel (PLAVIX) 75 MG tablet, Take 75 mg by mouth daily., Disp: , Rfl:  .  ferrous sulfate 325 (65 FE) MG tablet, Take by mouth., Disp: , Rfl:  .  insulin aspart protamine- aspart (NOVOLOG MIX 70/30) (70-30) 100 UNIT/ML injection, Inject 0.28 mLs (28 Units total) into the skin 2 (two) times daily with a meal. (Patient taking differently: Inject 50 Units into the skin daily with breakfast. ), Disp: 10 mL, Rfl: 11 .  insulin NPH-regular Human (HUMULIN 70/30) (70-30) 100 UNIT/ML injection, Inject into the skin., Disp: , Rfl:  .  levothyroxine (SYNTHROID) 75 MCG tablet, Take 75 mcg by mouth daily., Disp: , Rfl:  .  lidocaine (LIDODERM) 5 %, , Disp: , Rfl:  .  lisinopril (PRINIVIL,ZESTRIL) 20 MG tablet, Take 20 mg by mouth daily., Disp: , Rfl:  .  loperamide (IMODIUM A-D) 2 MG tablet, Take 1 tablet (2 mg total) by mouth as needed for diarrhea or loose stools (2x week)., Disp: 30 tablet, Rfl: 0 .  metoprolol succinate (TOPROL-XL) 50 MG 24 hr tablet, Take 50 mg by mouth 2 (two) times daily. Take with or immediately following a meal., Disp: , Rfl:  .  mirabegron ER (MYRBETRIQ) 25 MG TB24 tablet, Take 1 tablet (25 mg total) by mouth daily., Disp: 30 tablet, Rfl: 11 .  oxyCODONE-acetaminophen (PERCOCET/ROXICET) 5-325 MG tablet, Take 1 tablet by mouth 2 (two) times daily as needed for severe pain., Disp: , Rfl:  .  paricalcitol (ZEMPLAR) 1 MCG capsule, Take 1 mcg by mouth 3 (three) times a week. Pt  takes on Monday, Wednesday, and Friday., Disp: , Rfl:  .  Psyllium (METAMUCIL) 28.3 % POWD, Take 28.3 Doses by mouth daily., Disp: 1 Bottle, Rfl: 3 .  ranolazine (RANEXA) 500 MG 12 hr tablet, Take 500 mg by mouth 2 (two) times daily., Disp: , Rfl:  .  rosuvastatin (CRESTOR) 10 MG tablet, Take 10 mg by mouth daily., Disp: , Rfl:  .  sodium bicarbonate 650 MG tablet, Take 650 mg by mouth 2 (two) times daily., Disp: , Rfl:  .  TRUEPLUS INSULIN SYRINGE 29G X 1/2" 1 ML MISC, , Disp: , Rfl:    No Known Allergies  ROS Review of Systems  Constitutional: Negative.   HENT: Negative.   Eyes: Negative.   Respiratory: Negative.   Cardiovascular: Negative.   Gastrointestinal: Negative.   Endocrine: Negative.   Genitourinary: Negative.   Musculoskeletal: Negative.   Skin: Negative.   Allergic/Immunologic: Negative.   Neurological: Negative.   Hematological: Negative.   Psychiatric/Behavioral: Negative.   All other systems reviewed and are negative.     Objective:    Physical Exam Vitals reviewed.  Constitutional:      Appearance: Normal appearance.  HENT:     Mouth/Throat:     Mouth: Mucous membranes are moist.  Eyes:     Pupils: Pupils are equal, round, and reactive to light.  Neck:     Vascular: No carotid bruit.  Cardiovascular:     Rate and Rhythm: Normal rate. Rhythm irregular.     Pulses: Normal pulses.     Heart sounds: Normal heart sounds.  Pulmonary:     Effort: Pulmonary effort is normal.     Breath sounds: Normal breath sounds.  Abdominal:     General: Bowel sounds are normal.     Palpations: Abdomen is soft. There is no hepatomegaly, splenomegaly or mass.     Tenderness: There is no abdominal tenderness.     Hernia: No hernia is present.  Musculoskeletal:        General: No tenderness.     Cervical back: Neck supple.     Right lower leg: No edema.     Left lower leg: No edema.  Skin:    Findings: No rash.  Neurological:     Mental Status: She is alert and  oriented to person, place, and time.     Motor: No weakness.  Psychiatric:        Mood and Affect: Mood and affect normal.        Behavior: Behavior normal.     BP (!) 110/52   Pulse 66  Wt Readings from  Last 3 Encounters:  10/25/19 212 lb (96.2 kg)  04/16/19 211 lb (95.7 kg)  04/09/18 221 lb 12.8 oz (100.6 kg)     Health Maintenance Due  Topic Date Due  . COVID-19 Vaccine (1) Never done  . FOOT EXAM  Never done  . OPHTHALMOLOGY EXAM  Never done  . TETANUS/TDAP  Never done  . PNA vac Low Risk Adult (1 of 2 - PCV13) Never done  . HEMOGLOBIN A1C  12/25/2016  . INFLUENZA VACCINE  Never done    There are no preventive care reminders to display for this patient.  Lab Results  Component Value Date   TSH 4.64 (H) 02/03/2014   Lab Results  Component Value Date   WBC 7.3 08/11/2019   HGB 11.0 (L) 08/11/2019   HCT 33.8 (L) 08/11/2019   MCV 90.6 08/11/2019   PLT 242 08/11/2019   Lab Results  Component Value Date   NA 139 08/11/2019   K 4.3 08/11/2019   CO2 25 08/11/2019   GLUCOSE 170 (H) 08/11/2019   BUN 25 (H) 08/11/2019   CREATININE 1.58 (H) 08/11/2019   BILITOT 0.5 08/28/2016   ALKPHOS 51 08/28/2016   AST 26 08/28/2016   ALT 11 (L) 08/28/2016   PROT 6.2 (L) 08/28/2016   ALBUMIN 3.6 08/11/2019   CALCIUM 8.9 08/11/2019   ANIONGAP 12 08/11/2019   Lab Results  Component Value Date   CHOL 105 07/05/2012   Lab Results  Component Value Date   HDL 56 07/05/2012   Lab Results  Component Value Date   LDLCALC 10 07/05/2012   Lab Results  Component Value Date   TRIG 193 07/05/2012   No results found for: CHOLHDL Lab Results  Component Value Date   HGBA1C 6.7 (H) 06/27/2016      Assessment & Plan:   Problem List Items Addressed This Visit      Cardiovascular and Mediastinum   Paroxysmal atrial fibrillation ([place]) (Chronic)    Patient is not a candidate for anticoagulation because of 2 episodes of bleeding in the past and anemia.      Essential  hypertension    Blood pressure is stable at the present time.  Patient was advised to continue her present medication try to lose weight.      Sick sinus syndrome due to SA node dysfunction [facility])    Patient is functioning well.  Battery longevity is 9.4 years        Endocrine   Type 2 diabetes mellitus ([facility]) (Chronic)    Patient was instructed on diabetic diet.        Other   Pacemaker - Primary    Patient continues to work well      Relevant Orders   [place] (Completed)      No orders of the defined types were placed in this encounter.   Follow-up: No follow-ups on file.  Note: Medical Device Follow-up  Patient pacemaker was interrogated by pacemakers analyzer, battery status is okay.  No programming changes were indicated after the review of the data.  Histogram shows no change since the last interrogation Atrial and ventricular sensing thresholds were found to be acceptable Impedance was checked and it was found to be normal.  Thresholds were found to be okay on evaluation of rhythm problem.  No high rate or low rate arrhythmia were noted.  Estimated battery longevity is 9.4 years. I have personally reviewed the device data and amended the report as necessary.  Cletis Athens, MD

## 2020-09-23 NOTE — Assessment & Plan Note (Signed)
Patient is functioning well.  Battery longevity is 9.4 years

## 2020-09-23 NOTE — Assessment & Plan Note (Signed)
Patient was instructed on diabetic diet.

## 2020-09-23 NOTE — Assessment & Plan Note (Signed)
Patient is not a candidate for anticoagulation because of 2 episodes of bleeding in the past and anemia.

## 2020-10-17 ENCOUNTER — Other Ambulatory Visit: Payer: Self-pay

## 2020-10-17 ENCOUNTER — Emergency Department: Payer: Medicare Other

## 2020-10-17 ENCOUNTER — Inpatient Hospital Stay
Admission: EM | Admit: 2020-10-17 | Discharge: 2020-10-28 | DRG: 381 | Disposition: A | Discharge status: Skilled Nursing Facility | Payer: Medicare Other | Attending: Hospitalist | Admitting: Hospitalist

## 2020-10-17 DIAGNOSIS — E1159 Type 2 diabetes mellitus with other circulatory complications: Secondary | ICD-10-CM | POA: Diagnosis present

## 2020-10-17 DIAGNOSIS — Z950 Presence of cardiac pacemaker: Secondary | ICD-10-CM

## 2020-10-17 DIAGNOSIS — Z794 Long term (current) use of insulin: Secondary | ICD-10-CM

## 2020-10-17 DIAGNOSIS — D631 Anemia in chronic kidney disease: Secondary | ICD-10-CM | POA: Diagnosis present

## 2020-10-17 DIAGNOSIS — K5900 Constipation, unspecified: Secondary | ICD-10-CM | POA: Diagnosis not present

## 2020-10-17 DIAGNOSIS — E1122 Type 2 diabetes mellitus with diabetic chronic kidney disease: Secondary | ICD-10-CM | POA: Diagnosis present

## 2020-10-17 DIAGNOSIS — Z7902 Long term (current) use of antithrombotics/antiplatelets: Secondary | ICD-10-CM

## 2020-10-17 DIAGNOSIS — Z20822 Contact with and (suspected) exposure to covid-19: Secondary | ICD-10-CM | POA: Diagnosis present

## 2020-10-17 DIAGNOSIS — R1314 Dysphagia, pharyngoesophageal phase: Secondary | ICD-10-CM | POA: Diagnosis present

## 2020-10-17 DIAGNOSIS — R52 Pain, unspecified: Secondary | ICD-10-CM

## 2020-10-17 DIAGNOSIS — E869 Volume depletion, unspecified: Secondary | ICD-10-CM | POA: Diagnosis not present

## 2020-10-17 DIAGNOSIS — E119 Type 2 diabetes mellitus without complications: Secondary | ICD-10-CM

## 2020-10-17 DIAGNOSIS — K2211 Ulcer of esophagus with bleeding: Secondary | ICD-10-CM | POA: Diagnosis not present

## 2020-10-17 DIAGNOSIS — Z853 Personal history of malignant neoplasm of breast: Secondary | ICD-10-CM

## 2020-10-17 DIAGNOSIS — E538 Deficiency of other specified B group vitamins: Secondary | ICD-10-CM | POA: Diagnosis present

## 2020-10-17 DIAGNOSIS — K922 Gastrointestinal hemorrhage, unspecified: Secondary | ICD-10-CM | POA: Diagnosis not present

## 2020-10-17 DIAGNOSIS — D500 Iron deficiency anemia secondary to blood loss (chronic): Secondary | ICD-10-CM | POA: Diagnosis present

## 2020-10-17 DIAGNOSIS — E039 Hypothyroidism, unspecified: Secondary | ICD-10-CM | POA: Diagnosis present

## 2020-10-17 DIAGNOSIS — Z955 Presence of coronary angioplasty implant and graft: Secondary | ICD-10-CM

## 2020-10-17 DIAGNOSIS — E871 Hypo-osmolality and hyponatremia: Secondary | ICD-10-CM | POA: Diagnosis not present

## 2020-10-17 DIAGNOSIS — I495 Sick sinus syndrome: Secondary | ICD-10-CM | POA: Diagnosis present

## 2020-10-17 DIAGNOSIS — N1832 Chronic kidney disease, stage 3b: Secondary | ICD-10-CM | POA: Diagnosis present

## 2020-10-17 DIAGNOSIS — K219 Gastro-esophageal reflux disease without esophagitis: Secondary | ICD-10-CM | POA: Diagnosis present

## 2020-10-17 DIAGNOSIS — Z79899 Other long term (current) drug therapy: Secondary | ICD-10-CM

## 2020-10-17 DIAGNOSIS — Z9012 Acquired absence of left breast and nipple: Secondary | ICD-10-CM

## 2020-10-17 DIAGNOSIS — I10 Essential (primary) hypertension: Secondary | ICD-10-CM | POA: Diagnosis present

## 2020-10-17 DIAGNOSIS — I2510 Atherosclerotic heart disease of native coronary artery without angina pectoris: Secondary | ICD-10-CM | POA: Diagnosis present

## 2020-10-17 DIAGNOSIS — R531 Weakness: Secondary | ICD-10-CM

## 2020-10-17 DIAGNOSIS — K449 Diaphragmatic hernia without obstruction or gangrene: Secondary | ICD-10-CM | POA: Diagnosis present

## 2020-10-17 DIAGNOSIS — Z6841 Body Mass Index (BMI) 40.0 and over, adult: Secondary | ICD-10-CM

## 2020-10-17 DIAGNOSIS — E785 Hyperlipidemia, unspecified: Secondary | ICD-10-CM | POA: Diagnosis present

## 2020-10-17 DIAGNOSIS — I129 Hypertensive chronic kidney disease with stage 1 through stage 4 chronic kidney disease, or unspecified chronic kidney disease: Secondary | ICD-10-CM | POA: Diagnosis present

## 2020-10-17 DIAGNOSIS — N179 Acute kidney failure, unspecified: Secondary | ICD-10-CM | POA: Diagnosis not present

## 2020-10-17 DIAGNOSIS — Z79890 Hormone replacement therapy (postmenopausal): Secondary | ICD-10-CM

## 2020-10-17 DIAGNOSIS — I480 Paroxysmal atrial fibrillation: Secondary | ICD-10-CM | POA: Diagnosis present

## 2020-10-17 DIAGNOSIS — E8881 Metabolic syndrome: Secondary | ICD-10-CM | POA: Diagnosis present

## 2020-10-17 DIAGNOSIS — Z7982 Long term (current) use of aspirin: Secondary | ICD-10-CM

## 2020-10-17 LAB — CBC
HCT: 26.9 % — ABNORMAL LOW (ref 36.0–46.0)
Hemoglobin: 8.8 g/dL — ABNORMAL LOW (ref 12.0–15.0)
MCH: 30.1 pg (ref 26.0–34.0)
MCHC: 32.7 g/dL (ref 30.0–36.0)
MCV: 92.1 fL (ref 80.0–100.0)
Platelets: 227 10*3/uL (ref 150–400)
RBC: 2.92 MIL/uL — ABNORMAL LOW (ref 3.87–5.11)
RDW: 15.3 % (ref 11.5–15.5)
WBC: 8 10*3/uL (ref 4.0–10.5)
nRBC: 0 % (ref 0.0–0.2)

## 2020-10-17 LAB — GLUCOSE, CAPILLARY: Glucose-Capillary: 226 mg/dL — ABNORMAL HIGH (ref 70–99)

## 2020-10-17 LAB — CBC WITH DIFFERENTIAL/PLATELET
Abs Immature Granulocytes: 0.04 10*3/uL (ref 0.00–0.07)
Basophils Absolute: 0 10*3/uL (ref 0.0–0.1)
Basophils Relative: 0 %
Eosinophils Absolute: 0.1 10*3/uL (ref 0.0–0.5)
Eosinophils Relative: 1 %
HCT: 22.5 % — ABNORMAL LOW (ref 36.0–46.0)
Hemoglobin: 7.1 g/dL — ABNORMAL LOW (ref 12.0–15.0)
Immature Granulocytes: 0 %
Lymphocytes Relative: 17 %
Lymphs Abs: 1.6 10*3/uL (ref 0.7–4.0)
MCH: 30.7 pg (ref 26.0–34.0)
MCHC: 31.6 g/dL (ref 30.0–36.0)
MCV: 97.4 fL (ref 80.0–100.0)
Monocytes Absolute: 0.7 10*3/uL (ref 0.1–1.0)
Monocytes Relative: 8 %
Neutro Abs: 6.6 10*3/uL (ref 1.7–7.7)
Neutrophils Relative %: 74 %
Platelets: 262 10*3/uL (ref 150–400)
RBC: 2.31 MIL/uL — ABNORMAL LOW (ref 3.87–5.11)
RDW: 14 % (ref 11.5–15.5)
WBC: 9 10*3/uL (ref 4.0–10.5)
nRBC: 0 % (ref 0.0–0.2)

## 2020-10-17 LAB — COMPREHENSIVE METABOLIC PANEL
ALT: 18 U/L (ref 0–44)
AST: 30 U/L (ref 15–41)
Albumin: 2.4 g/dL — ABNORMAL LOW (ref 3.5–5.0)
Alkaline Phosphatase: 53 U/L (ref 38–126)
Anion gap: 5 (ref 5–15)
BUN: 47 mg/dL — ABNORMAL HIGH (ref 8–23)
CO2: 25 mmol/L (ref 22–32)
Calcium: 7.6 mg/dL — ABNORMAL LOW (ref 8.9–10.3)
Chloride: 105 mmol/L (ref 98–111)
Creatinine, Ser: 1.68 mg/dL — ABNORMAL HIGH (ref 0.44–1.00)
GFR, Estimated: 30 mL/min — ABNORMAL LOW (ref >60)
Glucose, Bld: 138 mg/dL — ABNORMAL HIGH (ref 70–99)
Potassium: 5.2 mmol/L — ABNORMAL HIGH (ref 3.5–5.1)
Sodium: 135 mmol/L (ref 135–145)
Total Bilirubin: 0.7 mg/dL (ref 0.3–1.2)
Total Protein: 4.7 g/dL — ABNORMAL LOW (ref 6.5–8.1)

## 2020-10-17 LAB — TROPONIN I (HIGH SENSITIVITY)
Troponin I (High Sensitivity): 15 ng/L (ref <18)
Troponin I (High Sensitivity): 14 ng/L (ref <18)

## 2020-10-17 LAB — PREPARE RBC (CROSSMATCH)

## 2020-10-17 MED ORDER — INSULIN ASPART PROT & ASPART (70-30 MIX) 100 UNIT/ML ~~LOC~~ SUSP: 28.0000 [IU] | Freq: Two times a day (BID) | SUBCUTANEOUS | Status: DC

## 2020-10-17 MED ORDER — LEVOTHYROXINE SODIUM 50 MCG PO TABS
75.0000 ug | ORAL_TABLET | Freq: Every day | ORAL | Status: DC
  Administered 2020-10-18 – 2020-10-28 (×10): 75 ug via ORAL
  Filled 2020-10-17 (×11): qty 1

## 2020-10-17 MED ORDER — ROSUVASTATIN CALCIUM 10 MG PO TABS
10.0000 mg | ORAL_TABLET | Freq: Every day | ORAL | Status: DC
Start: 2020-10-18 — End: 2020-10-28
  Administered 2020-10-18 – 2020-10-28 (×11): 10 mg via ORAL
  Filled 2020-10-17 (×11): qty 1

## 2020-10-17 MED ORDER — RANOLAZINE ER 500 MG PO TB12
500.0000 mg | ORAL_TABLET | Freq: Two times a day (BID) | ORAL | Status: DC
  Administered 2020-10-17 – 2020-10-26 (×18): 500 mg via ORAL
  Filled 2020-10-17 (×20): qty 1

## 2020-10-17 MED ORDER — LISINOPRIL 20 MG PO TABS
20.0000 mg | ORAL_TABLET | Freq: Every day | ORAL | Status: DC
  Administered 2020-10-18: 20 mg via ORAL
  Filled 2020-10-17: qty 1

## 2020-10-17 MED ORDER — INSULIN ASPART 100 UNIT/ML ~~LOC~~ SOLN
0.0000 [IU] | Freq: Three times a day (TID) | SUBCUTANEOUS | Status: DC
  Administered 2020-10-18: 5 [IU] via SUBCUTANEOUS
  Administered 2020-10-18: 3 [IU] via SUBCUTANEOUS
  Administered 2020-10-18: 2 [IU] via SUBCUTANEOUS
  Administered 2020-10-19: 8 [IU] via SUBCUTANEOUS
  Administered 2020-10-19: 2 [IU] via SUBCUTANEOUS
  Administered 2020-10-19 – 2020-10-20 (×2): 3 [IU] via SUBCUTANEOUS
  Administered 2020-10-20: 5 [IU] via SUBCUTANEOUS
  Administered 2020-10-21 (×2): 3 [IU] via SUBCUTANEOUS
  Administered 2020-10-21: 5 [IU] via SUBCUTANEOUS
  Administered 2020-10-22 (×3): 2 [IU] via SUBCUTANEOUS
  Administered 2020-10-23: 3 [IU] via SUBCUTANEOUS
  Administered 2020-10-24: 5 [IU] via SUBCUTANEOUS
  Administered 2020-10-24: 3 [IU] via SUBCUTANEOUS
  Administered 2020-10-25: 5 [IU] via SUBCUTANEOUS
  Administered 2020-10-25: 0.8 [IU] via SUBCUTANEOUS
  Administered 2020-10-25: 8 [IU] via SUBCUTANEOUS
  Administered 2020-10-26: 5 [IU] via SUBCUTANEOUS
  Administered 2020-10-26: 3 [IU] via SUBCUTANEOUS
  Administered 2020-10-26 – 2020-10-27 (×2): 5 [IU] via SUBCUTANEOUS
  Administered 2020-10-27: 2 [IU] via SUBCUTANEOUS
  Administered 2020-10-27: 3 [IU] via SUBCUTANEOUS
  Administered 2020-10-28: 2 [IU] via SUBCUTANEOUS
  Administered 2020-10-28: 3 [IU] via SUBCUTANEOUS
  Filled 2020-10-17 (×24): qty 1

## 2020-10-17 MED ORDER — SODIUM CHLORIDE 0.9 % IV SOLN
10.0000 mL/h | Freq: Once | INTRAVENOUS | Status: AC
  Administered 2020-10-17: 10 mL/h via INTRAVENOUS

## 2020-10-17 MED ORDER — ACETAMINOPHEN 650 MG RE SUPP: 325.0000 mg | Freq: Four times a day (QID) | RECTAL | Status: DC | PRN

## 2020-10-17 MED ORDER — FLUTICASONE PROPIONATE 50 MCG/ACT NA SUSP
2.0000 | Freq: Every day | NASAL | Status: DC | PRN
  Filled 2020-10-17: qty 16

## 2020-10-17 MED ORDER — ONDANSETRON HCL 4 MG/2ML IJ SOLN: 4.0000 mg | Freq: Four times a day (QID) | INTRAMUSCULAR | Status: DC | PRN

## 2020-10-17 MED ORDER — OXYCODONE-ACETAMINOPHEN 5-325 MG PO TABS
1.0000 | ORAL_TABLET | Freq: Two times a day (BID) | ORAL | Status: DC | PRN
  Administered 2020-10-21 – 2020-10-24 (×2): 1 via ORAL
  Filled 2020-10-17 (×3): qty 1

## 2020-10-17 MED ORDER — BUMETANIDE 1 MG PO TABS
2.0000 mg | ORAL_TABLET | Freq: Every day | ORAL | Status: DC
  Administered 2020-10-17 – 2020-10-18 (×2): 2 mg via ORAL
  Filled 2020-10-17 (×3): qty 2

## 2020-10-17 MED ORDER — METOPROLOL SUCCINATE ER 50 MG PO TB24
50.0000 mg | ORAL_TABLET | Freq: Two times a day (BID) | ORAL | Status: DC
  Administered 2020-10-18 – 2020-10-25 (×8): 50 mg via ORAL
  Filled 2020-10-17 (×11): qty 1

## 2020-10-17 MED ORDER — INSULIN ASPART 100 UNIT/ML ~~LOC~~ SOLN
0.0000 [IU] | Freq: Every day | SUBCUTANEOUS | Status: DC
  Administered 2020-10-17: 2 [IU] via SUBCUTANEOUS
  Administered 2020-10-19 – 2020-10-20 (×2): 3 [IU] via SUBCUTANEOUS
  Administered 2020-10-21: 2 [IU] via SUBCUTANEOUS
  Administered 2020-10-22: 3 [IU] via SUBCUTANEOUS
  Administered 2020-10-23 – 2020-10-25 (×2): 2 [IU] via SUBCUTANEOUS
  Filled 2020-10-17 (×7): qty 1

## 2020-10-17 MED ORDER — ACETAMINOPHEN 325 MG PO TABS: 325.0000 mg | ORAL_TABLET | Freq: Four times a day (QID) | ORAL | Status: DC | PRN

## 2020-10-17 MED ORDER — LACTATED RINGERS IV BOLUS
1000.0000 mL | Freq: Once | INTRAVENOUS | Status: AC
  Administered 2020-10-17: 1000 mL via INTRAVENOUS

## 2020-10-17 MED ORDER — SODIUM CHLORIDE 0.9 % IV SOLN
8.0000 mg/h | INTRAVENOUS | Status: DC
  Administered 2020-10-17 – 2020-10-19 (×6): 8 mg/h via INTRAVENOUS
  Filled 2020-10-17 (×7): qty 80

## 2020-10-17 MED ORDER — SODIUM CHLORIDE 0.9 % IV SOLN
80.0000 mg | Freq: Once | INTRAVENOUS | Status: DC
  Filled 2020-10-17: qty 80

## 2020-10-17 MED ORDER — PANTOPRAZOLE SODIUM 40 MG IV SOLR
40.0000 mg | Freq: Once | INTRAVENOUS | Status: AC
  Administered 2020-10-17: 40 mg via INTRAVENOUS
  Filled 2020-10-17: qty 40

## 2020-10-17 MED ORDER — ONDANSETRON HCL 4 MG PO TABS: 4.0000 mg | ORAL_TABLET | Freq: Four times a day (QID) | ORAL | Status: DC | PRN

## 2020-10-17 MED ORDER — PANTOPRAZOLE SODIUM 40 MG IV SOLR: 40.0000 mg | Freq: Two times a day (BID) | INTRAVENOUS | Status: DC

## 2020-10-17 NOTE — ED Notes (Signed)
Type and Screen resent per lab request at this time

## 2020-10-17 NOTE — ED Provider Notes (Signed)
[facility] Emergency Department Provider Note   ____________________________________________   Event Date/Time   First MD Initiated Contact with Patient 10/17/20 1231     (approximate)  I have reviewed the triage vital signs and the nursing notes.   HISTORY  Chief Complaint Weakness    HPI Regina [name] is a 87 y.o. female with past medical history of hypertension, hyperlipidemia, diabetes, and CAD who presents to the ED complaining of generalized weakness.  Patient reports that she had been fine up until 4:00 this morning, when she tried to get up to go to the bathroom.  She reports feeling much weaker than usual when she tried to stand up, was unable to walk on her own and had to sit back down in her bed.  She again tried to get up out of the bed but was unable to do so on her own and her home health aide called EMS.  Patient states that she has otherwise been feeling well with no fevers, cough, chest pain, shortness of breath, dysuria, hematuria, vomiting, or diarrhea.  She has not noticed any blood in her stool, does take Plavix for her coronary artery disease.        Past Medical History:  Diagnosis Date  . Breast cancer, left [facility]) 2013   Mastectomy.   . CAD (coronary artery disease)    s/p cath in 2009 with 2 stents  . Diabetes mellitus without complication ([place])   . GERD (gastroesophageal reflux disease)   . HLD (hyperlipidemia)   . Hypertension   . Hypothyroidism   . Paroxysmal atrial fibrillation [facility])     Patient Active Problem List   Diagnosis Date Noted  . Pacemaker 12/16/2019  . History of cancer of left breast 12/16/2019  . Sick sinus syndrome due to SA node dysfunction ([place]) 12/16/2019  . Pressure injury of skin 09/26/2016  . C. difficile colitis 09/25/2016  . Sepsis ([place]) 08/28/2016  . Cellulitis 06/26/2016  . UTI (lower urinary tract infection) 02/20/2015  . Elevated troponin 02/20/2015  . Hypoglycemia 02/20/2015  . Type  2 diabetes mellitus ([place]) 02/20/2015  . Essential hypertension 02/20/2015  . HLD (hyperlipidemia) 02/20/2015  . Paroxysmal atrial fibrillation ([place]) 02/20/2015  . GERD (gastroesophageal reflux disease) 02/20/2015    Past Surgical History:  Procedure Laterality Date  . BREAST LUMPECTOMY    . CARDIAC CATHETERIZATION    . CHOLECYSTECTOMY    . MASTECTOMY Left   . PACEMAKER INSERTION Left 04/21/2019   Procedure: Pacemaker Change Out;  Surgeon: Cletis Athens, MD;  Location: [facility] ORS;  Service: Cardiovascular;  Laterality: Left;  . STENT PLACEMENT VASCULAR ([place] HX)      Prior to Admission medications   Medication Sig Start Date End Date Taking? Authorizing Provider  aspirin EC 81 MG tablet Take 81 mg by mouth daily.    [provider]  bumetanide (BUMEX) 2 MG tablet Take 2 mg by mouth daily.    [provider]  ciprofloxacin (CIPRO) 250 MG tablet Take 1 tablet (250 mg total) by mouth 2 (two) times daily. 10/25/19   Bjorn Loser, MD  clopidogrel (PLAVIX) 75 MG tablet Take 75 mg by mouth daily.    [provider]  ferrous sulfate 325 (65 FE) MG tablet Take by mouth.    [provider]  insulin aspart protamine- aspart (NOVOLOG MIX 70/30) (70-30) 100 UNIT/ML injection Inject 0.28 mLs (28 Units total) into the skin 2 (two) times daily with a meal. Patient taking differently:  Inject 50 Units into the skin daily with breakfast.  02/21/15   Gouru, Illene Silver, MD  insulin NPH-regular Human (HUMULIN 70/30) (70-30) 100 UNIT/ML injection Inject into the skin. 03/03/19   [provider]  levothyroxine (SYNTHROID) 75 MCG tablet Take 75 mcg by mouth daily. 07/22/19   [provider]  lidocaine (LIDODERM) 5 %  09/02/19   [provider]  lisinopril (PRINIVIL,ZESTRIL) 20 MG tablet Take 20 mg by mouth daily. 08/25/16   [provider]  loperamide (IMODIUM A-D) 2 MG tablet Take 1 tablet (2 mg total) by mouth as needed for diarrhea or loose  stools (2x week). 04/09/18   Virgel Manifold, MD  metoprolol succinate (TOPROL-XL) 50 MG 24 hr tablet Take 50 mg by mouth 2 (two) times daily. Take with or immediately following a meal.    [provider]  mirabegron ER (MYRBETRIQ) 25 MG TB24 tablet Take 1 tablet (25 mg total) by mouth daily. 10/25/19   Bjorn Loser, MD  oxyCODONE-acetaminophen (PERCOCET/ROXICET) 5-325 MG tablet Take 1 tablet by mouth 2 (two) times daily as needed for severe pain.    [provider]  paricalcitol (ZEMPLAR) 1 MCG capsule Take 1 mcg by mouth 3 (three) times a week. Pt takes on Monday, Wednesday, and Friday.    [provider]  Psyllium (METAMUCIL) 28.3 % POWD Take 28.3 Doses by mouth daily. 04/09/18   Virgel Manifold, MD  ranolazine (RANEXA) 500 MG 12 hr tablet Take 500 mg by mouth 2 (two) times daily.    [provider]  rosuvastatin (CRESTOR) 10 MG tablet Take 10 mg by mouth daily.    [provider]  sodium bicarbonate 650 MG tablet Take 650 mg by mouth 2 (two) times daily.    [provider]  TRUEPLUS INSULIN SYRINGE 29G X 1/2" 1 ML MISC  08/27/19   [provider]    Allergies Patient has no known allergies.  Family History  Problem Relation Age of Onset  . Kidney failure Mother   . Lung cancer Father   . Throat cancer Son     Social History Social History   Tobacco Use  . Smoking status: Never Smoker  . Smokeless tobacco: Never Used  Substance Use Topics  . Alcohol use: No    Alcohol/week: 0.0 standard drinks  . Drug use: No    Review of Systems  Constitutional: No fever/chills.  Positive for generalized weakness. Eyes: No visual changes. ENT: No sore throat. Cardiovascular: Denies chest pain. Respiratory: Denies shortness of breath. Gastrointestinal: No abdominal pain.  No nausea, no vomiting.  No diarrhea.  No constipation. Genitourinary: Negative for dysuria. Musculoskeletal: Negative for back pain. Skin:  Negative for rash. Neurological: Negative for headaches, focal weakness or numbness.  ____________________________________________   PHYSICAL EXAM:  VITAL SIGNS: ED Triage Vitals  Enc Vitals Group     BP --      Pulse Rate 10/17/20 1232 68     Resp 10/17/20 1232 18     Temp 10/17/20 1232 98.5 F (36.9 C)     Temp Source 10/17/20 1232 Oral     SpO2 10/17/20 1230 100 %     Weight --      Height --      Head Circumference --      Peak Flow --      Pain Score 10/17/20 1233 0     Pain Loc --      Pain Edu? --  Excl. in [place]? --     Constitutional: Alert and oriented.  Pale appearing. Eyes: Conjunctivae are normal. Head: Atraumatic. Nose: No congestion/rhinnorhea. Mouth/Throat: Mucous membranes are moist. Neck: Normal ROM Cardiovascular: Normal rate, regular rhythm. Grossly normal heart sounds. Respiratory: Normal respiratory effort.  No retractions. Lungs CTAB. Gastrointestinal: Soft and nontender. No distention.  Rectal exam with maroon-colored guaiac positive stool. Genitourinary: deferred Musculoskeletal: No lower extremity tenderness nor edema. Neurologic:  Normal speech and language. No gross focal neurologic deficits are appreciated. Skin:  Skin is warm, dry and intact. No rash noted. Psychiatric: Mood and affect are normal. Speech and behavior are normal.  ____________________________________________   LABS (all labs ordered are listed, but only abnormal results are displayed)  Labs Reviewed  CBC WITH DIFFERENTIAL/PLATELET - Abnormal; Notable for the following components:      Result Value   RBC 2.31 (*)    Hemoglobin 7.1 (*)    HCT 22.5 (*)    All other components within normal limits  COMPREHENSIVE METABOLIC PANEL - Abnormal; Notable for the following components:   Potassium 5.2 (*)    Glucose, Bld 138 (*)    BUN 47 (*)    Creatinine, Ser 1.68 (*)    Calcium 7.6 (*)    Total Protein 4.7 (*)    Albumin 2.4 (*)    GFR, Estimated 30 (*)    All other  components within normal limits  SARS CORONAVIRUS 2 (TAT 6-24 HRS)  URINALYSIS, COMPLETE (UACMP) WITH MICROSCOPIC  PREPARE RBC (CROSSMATCH)  TYPE AND SCREEN  TROPONIN I (HIGH SENSITIVITY)   ____________________________________________  EKG  ED ECG REPORT I, Blake Divine, the attending physician, personally viewed and interpreted this ECG.   Date: 10/17/2020  EKG Time: 12:32  Rate: 70  Rhythm: normal sinus rhythm  Axis: Normal  Intervals:none  ST&T Change: None   PROCEDURES  Procedure(s) performed (including Critical Care):  .Critical Care Performed by: Blake Divine, MD Authorized by: Blake Divine, MD   Critical care provider statement:    Critical care time (minutes):  45   Critical care time was exclusive of:  Separately billable procedures and treating other patients and teaching time   Critical care was necessary to treat or prevent imminent or life-threatening deterioration of the following conditions:  Circulatory failure   Critical care was time spent personally by me on the following activities:  Discussions with consultants, evaluation of patient's response to treatment, examination of patient, ordering and performing treatments and interventions, ordering and review of laboratory studies, ordering and review of radiographic studies, pulse oximetry, re-evaluation of patient's condition, obtaining history from patient or surrogate and review of old charts   I assumed direction of critical care for this patient from another provider in my specialty: no     Care discussed with: admitting provider       ____________________________________________   INITIAL IMPRESSION / ASSESSMENT AND PLAN / ED COURSE       87 year old female with past medical history of hypertension, hyperlipidemia, diabetes, CAD on Plavix who presents to the ED complaining of generalized weakness starting around 4:00 this morning resulting in difficulty walking.  She has no focal  neurologic deficits on exam and I doubt stroke.  She does appear pale and rectal exam shows maroon guaiac positive stool.  Hemoglobin with significant drop to 7.1 today and patient continues to pass bloody stool here in the ED.  We will transfuse 1 unit PRBCs.  Remainder of labs are unremarkable, chronic kidney disease stable from  previous and chest x-ray reviewed by me with no infiltrate, edema, or effusion.  Plan to discuss with hospitalist for admission.      ____________________________________________   FINAL CLINICAL IMPRESSION(S) / ED DIAGNOSES  Final diagnoses:  Generalized weakness  Gastrointestinal hemorrhage, unspecified gastrointestinal hemorrhage type     ED Discharge Orders    None       Note:  This document was prepared using Dragon voice recognition software and may include unintentional dictation errors.   Blake Divine, MD 10/17/20 1346

## 2020-10-17 NOTE — ED Notes (Addendum)
Pt changed after incontinence episode at this time. Pt noted to have large amount of black stool in brief at this time. Pt given clean brief and chux at this time. Pt scooted up in bed by this RN and IT sales professional for comfort at this time. Warm blanket given.

## 2020-10-17 NOTE — Plan of Care (Signed)

## 2020-10-17 NOTE — ED Notes (Addendum)
Pt daughter [name] updated at this time with pt permission. Pt given phone to speak with daughter at this time

## 2020-10-17 NOTE — ED Notes (Signed)
Pt daughter updated at this time.

## 2020-10-17 NOTE — ED Notes (Signed)
Pt to be transported to floor at this time by EDT Mickel Baas

## 2020-10-17 NOTE — ED Notes (Signed)
Pt came into ED with saturated brief. Brief removed, pt cleaned, clean chux and brief applied at this time. Purewick in place at this time.

## 2020-10-17 NOTE — ED Notes (Signed)
Pt back from xray at this time.

## 2020-10-17 NOTE — ED Notes (Signed)
Pt cleaned after incontinence episode. Pt noted to have large amount of black, watery stool in brief at this time. Pt cleaned and clean brief and chux placed at this time by this RN and Mickel Baas EDT.

## 2020-10-17 NOTE — ED Notes (Signed)
Pt given full liquid diet meal tray at this time. Pt assisted with opening items on tray at this time. Pt sat up in bed to eat at this time.

## 2020-10-17 NOTE — ED Notes (Signed)
Paper blood transfusion consent signed and placed in pt chart at this time

## 2020-10-17 NOTE — ED Triage Notes (Signed)
Pt arrived via [facility] from home with c/o weakness that started at 0400 this AM. Per daughter, pt AMS. Per EMS, pt A&Ox4, answers all questions appropriately at this time.   Per EMS, pt is also having new onset incontinence  Per EMS, pt has hx DM, HTN, and renal insufficiency  Pt A&Ox4 at this time

## 2020-10-17 NOTE — ED Notes (Addendum)
This RN given the ok by RN Elberta Fortis to bring pt up at this time.

## 2020-10-17 NOTE — H&P (Addendum)
History and Physical   Regina [name] CHE:[id number] DOB: 1933/10/01 DOA: 10/17/2020  PCP: Donnie Coffin, MD  Outpatient Specialists: Dr. Lavera Guise, cardiology Patient coming from: home  I have personally briefly reviewed patient's old medical records in [place].  Chief Concern: weakness  HPI: Regina [name] is a 87 y.o. female with medical history significant for GERD, chronic low back pain, sick sinus syndrome status post pacemaker, hypothyroid, insulin-dependent diabetes mellitus, CAD, presented to the emergency department for chief concerns of weakness.  Regina [name] endorses feeling weak and dizzy when Regina [name] got up to use the restroom this AM. Regina [name] reports Regina [name] has never felt this way before. Regina [name] endorses black and dark stool for unknown duration of time. Regina [name] denies history of GI bleed.  Regina [name] denies chest pain, shortness of breath, fever, dysuria, hematuria, bright red blood in her stool.  Regina [name] denies vomiting.  Regina [name] denies nsaids, bc powder, goody powder, and etoh use.   Social history: lives by herself. Regina [name] denies tobacco, etoh, and recreational. Regina [name] is currently retired and formerly worked in [employer].   At bedside, Regina [name] looked pale and Regina [name] is awake alert and oriented to herself, age, location of hospital, and current calendar year. Vaccine: Regina [name] is not vaccinated for covid.   ROS: Constitutional: no weight change, no fever ENT/Mouth: no sore throat, no rhinorrhea Eyes: no eye pain, no vision changes Cardiovascular: no chest pain, no dyspnea,  no edema, no palpitations Respiratory: no cough, no sputum, no wheezing Gastrointestinal: no nausea, no vomiting, no diarrhea, no constipation Genitourinary: no urinary incontinence, no dysuria, no hematuria Musculoskeletal: no arthralgias, no myalgias [name]: no [name] lesions, no pruritus, Neuro: + weakness (4 AM), no loss of consciousness, no syncope Psych: no anxiety, no depression, + decrease appetite Heme/Lymph: no bruising, no  bleeding  ED Course: Discussed with ED provider, patient requiring hospitalization due to acute severe anemia suspect secondary to GI bleed.  Vitals in the ED was afebrile with temperature of 98.5, respiration rate of 18, heart rate 63, initial blood pressure was 83/65, satting 90% on room air.  Assessment/Plan  Active Problems:   Acute upper GI bleeding   Severe anemia suspect secondary to acute upper GI bleed -Protonix GTT initiated -1 unit of packed red blood cells ordered per ED provider -Repeat CBC at 1900 -GI specialist, Dr. Marius Ditch has been consulted via secure chat -Dr. Marius Ditch recommends full liquid diet and goal hemoglobin of greater than 8  Hypertension-currently low normotensive, resumed lisinopril 20 mg and metoprolol succinate 50 mg for 10/18/2020 -Bumex 2 mg p.o. daily resumed scheduled for after blood transfusion  Hyperlipidemia-rosuvastatin 10 mg daily  Insulin-dependent diabetes mellitus-insulin SSI with at bedtime coverage ordered  Hypothyroid-levothyroxine 75 mcg resumed Chart reviewed.   DVT prophylaxis: TED hose Code Status: Full code Diet: Full liquid diet per GI Family Communication: No Disposition Plan: Pending clinical course Consults called: Gastroenterology Admission status: Progressive cardiac observation  Past Medical History:  Diagnosis Date  . Breast cancer, left [facility]) 2013   Mastectomy.   . CAD (coronary artery disease)    s/p cath in 2009 with 2 stents  . Diabetes mellitus without complication ([place])   . GERD (gastroesophageal reflux disease)   . HLD (hyperlipidemia)   . Hypertension   . Hypothyroidism   . Paroxysmal atrial fibrillation [facility])    Past Surgical History:  Procedure Laterality Date  . BREAST LUMPECTOMY    . CARDIAC CATHETERIZATION    . CHOLECYSTECTOMY    .  MASTECTOMY Left   . PACEMAKER INSERTION Left 04/21/2019   Procedure: Pacemaker Change Out;  Surgeon: Cletis Athens, MD;  Location: [facility] ORS;  Service: Cardiovascular;   Laterality: Left;  . STENT PLACEMENT VASCULAR ([place] HX)     Social History:  reports that Regina [name] has never smoked. Regina [name] has never used smokeless tobacco. Regina [name] reports that Regina [name] does not drink alcohol and does not use drugs.  No Known Allergies Family History  Problem Relation Age of Onset  . Kidney failure Mother   . Lung cancer Father   . Throat cancer Son    Family history: Family history reviewed and not pertinent  Prior to Admission medications   Medication Sig Start Date End Date Taking? Authorizing Provider  aspirin EC 81 MG tablet Take 81 mg by mouth daily.    [provider]  bumetanide (BUMEX) 2 MG tablet Take 2 mg by mouth daily.    [provider]  ciprofloxacin (CIPRO) 250 MG tablet Take 1 tablet (250 mg total) by mouth 2 (two) times daily. 10/25/19   Bjorn Loser, MD  clopidogrel (PLAVIX) 75 MG tablet Take 75 mg by mouth daily.    [provider]  ferrous sulfate 325 (65 FE) MG tablet Take by mouth.    [provider]  insulin aspart protamine- aspart (NOVOLOG MIX 70/30) (70-30) 100 UNIT/ML injection Inject 0.28 mLs (28 Units total) into the [name] 2 (two) times daily with a meal. Patient taking differently: Inject 50 Units into the [name] daily with breakfast.  02/21/15   Gouru, Aruna, MD  insulin NPH-regular Human (HUMULIN 70/30) (70-30) 100 UNIT/ML injection Inject into the [name]. 03/03/19   [provider]  levothyroxine (SYNTHROID) 75 MCG tablet Take 75 mcg by mouth daily. 07/22/19   [provider]  lidocaine (LIDODERM) 5 %  09/02/19   [provider]  lisinopril (PRINIVIL,ZESTRIL) 20 MG tablet Take 20 mg by mouth daily. 08/25/16   [provider]  loperamide (IMODIUM A-D) 2 MG tablet Take 1 tablet (2 mg total) by mouth as needed for diarrhea or loose stools (2x week). 04/09/18   Virgel Manifold, MD  metoprolol succinate (TOPROL-XL) 50 MG 24 hr tablet Take 50 mg by mouth 2 (two) times daily. Take with or  immediately following a meal.    [provider]  mirabegron ER (MYRBETRIQ) 25 MG TB24 tablet Take 1 tablet (25 mg total) by mouth daily. 10/25/19   Bjorn Loser, MD  oxyCODONE-acetaminophen (PERCOCET/ROXICET) 5-325 MG tablet Take 1 tablet by mouth 2 (two) times daily as needed for severe pain.    [provider]  paricalcitol (ZEMPLAR) 1 MCG capsule Take 1 mcg by mouth 3 (three) times a week. Pt takes on Monday, Wednesday, and Friday.    [provider]  Psyllium (METAMUCIL) 28.3 % POWD Take 28.3 Doses by mouth daily. 04/09/18   Virgel Manifold, MD  ranolazine (RANEXA) 500 MG 12 hr tablet Take 500 mg by mouth 2 (two) times daily.    [provider]  rosuvastatin (CRESTOR) 10 MG tablet Take 10 mg by mouth daily.    [provider]  sodium bicarbonate 650 MG tablet Take 650 mg by mouth 2 (two) times daily.    [provider]  TRUEPLUS INSULIN SYRINGE 29G X 1/2" 1 ML MISC  08/27/19   [provider]   Physical Exam: Vitals:   10/17/20 1232 10/17/20 1233 10/17/20 1235 10/17/20 1330  BP:   (!) 98/39 (!) 135/111  Pulse:  68   (!) 59  Resp: 18   (!) 32  Temp: 98.5 F (36.9 C)     TempSrc: Oral     SpO2: 100%   98%  Weight:  93.9 kg    Height:  4\' 11"  (1.499 m)     Constitutional: appears age-appropriate, NAD, calm, comfortable Eyes: PERRL, lids and conjunctivae normal.  Bilateral pupils are irregular shaped ENMT: Mucous membranes are moist. Posterior pharynx clear of any exudate or lesions. Age-appropriate dentition. Hearing appropriate. Neck: normal, supple, no masses, no thyromegaly Respiratory: clear to auscultation bilaterally, no wheezing, no crackles. Normal respiratory effort. No accessory muscle use.  Cardiovascular: Regular rate and rhythm, no murmurs / rubs / gallops. No extremity edema. 2+ pedal pulses. No carotid bruits.  Abdomen: Morbidly obese abdomen, no tenderness, no masses palpated, no hepatosplenomegaly.  Bowel sounds positive.  Musculoskeletal: no clubbing / cyanosis. No joint deformity upper and lower extremities. Good ROM, no contractures, no atrophy. Normal muscle tone.  [name]: no rashes, lesions, ulcers. No induration Neurologic: Sensation intact. Strength 5/5 in all 4.  Psychiatric: Normal judgment and insight. Alert and oriented x 3. Normal mood.   EKG: independently reviewed, showing sinus rhythm with rate of 70, QTc 495  Chest x-ray on Admission: I personally reviewed and I agree with radiologist reading as below.  DG Chest 2 View  Result Date: 10/17/2020 CLINICAL DATA:  Weakness. EXAM: CHEST - 2 VIEW COMPARISON:  04/18/2019. FINDINGS: Cardiac pacer stable position. Heart size normal. Questionable nodular density left upper lung. This could be overlying the patient. PA lateral chest x-ray suggested further evaluation. No focal infiltrate. No pleural effusion or pneumothorax. No acute bony abnormality. Degenerative change thoracic spine. IMPRESSION: 1. Questionable nodular density left upper lung. This could be overlying the patient. PA and lateral chest x-ray suggested further evaluation. 2. Cardiac pacer stable position. Heart size normal. No acute pulmonary disease. Electronically Signed   By: Marcello Moores  Register   On: 10/17/2020 13:38   Labs on Admission: I have personally reviewed following labs  CBC: Recent Labs  Lab 10/17/20 1242  WBC 9.0  NEUTROABS 6.6  HGB 7.1*  HCT 22.5*  MCV 97.4  PLT 412   Basic Metabolic Panel: Recent Labs  Lab 10/17/20 1242  NA 135  K 5.2*  CL 105  CO2 25  GLUCOSE 138*  BUN 47*  CREATININE 1.68*  CALCIUM 7.6*   GFR: Estimated Creatinine Clearance: 24.5 mL/min (A) (by C-G formula based on SCr of 1.68 mg/dL (H)).  Liver Function Tests: Recent Labs  Lab 10/17/20 1242  AST 30  ALT 18  ALKPHOS 53  BILITOT 0.7  PROT 4.7*  ALBUMIN 2.4*   Urine analysis:    Component Value Date/Time   COLORURINE AMBER (A) 08/11/2019 1201    APPEARANCEUR Cloudy (A) 10/25/2019 1533   LABSPEC 1.018 08/11/2019 1201   LABSPEC 1.008 07/03/2012 1616   PHURINE 6.0 08/11/2019 1201   GLUCOSEU Negative 10/25/2019 1533   GLUCOSEU Negative 07/03/2012 1616   HGBUR NEGATIVE 08/11/2019 1201   BILIRUBINUR Negative 10/25/2019 1533   BILIRUBINUR Negative 07/03/2012 [place] 08/11/2019 1201   PROTEINUR 2+ (A) 10/25/2019 1533   PROTEINUR 100 (A) 08/11/2019 1201   NITRITE Negative 10/25/2019 1533   NITRITE POSITIVE (A) 08/11/2019 1201   LEUKOCYTESUR 2+ (A) 10/25/2019 1533   LEUKOCYTESUR LARGE (A) 08/11/2019 1201   LEUKOCYTESUR Trace 07/03/2012 1616   Sekou Zuckerman N Uma Jerde D.O. [facility]  If 7PM-7AM, please contact overnight-coverage provider If 7AM-7PM,  please contact [place] coverage provider [email or web address]  10/17/2020, 2:18 PM

## 2020-10-17 NOTE — ED Notes (Signed)
Pt cleaned after incontinence episode at this time. Pt noted to have a large amount of black, liquid consistency stool at this time. Pt cleaned and given clean brief and chux at this time.   Pt scooted up in bed for comfort at this time

## 2020-10-17 NOTE — ED Notes (Signed)
Pt cleaned up after incontinence episode, clean brief placed at this time.

## 2020-10-17 NOTE — ED Notes (Signed)
Called X-Ray that pt is ready at this time.

## 2020-10-17 NOTE — ED Notes (Signed)
Pt given warm blanket and remote control at this time

## 2020-10-18 DIAGNOSIS — E871 Hypo-osmolality and hyponatremia: Secondary | ICD-10-CM | POA: Diagnosis not present

## 2020-10-18 DIAGNOSIS — Z955 Presence of coronary angioplasty implant and graft: Secondary | ICD-10-CM | POA: Diagnosis not present

## 2020-10-18 DIAGNOSIS — D631 Anemia in chronic kidney disease: Secondary | ICD-10-CM | POA: Diagnosis present

## 2020-10-18 DIAGNOSIS — I480 Paroxysmal atrial fibrillation: Secondary | ICD-10-CM | POA: Diagnosis present

## 2020-10-18 DIAGNOSIS — K219 Gastro-esophageal reflux disease without esophagitis: Secondary | ICD-10-CM | POA: Diagnosis present

## 2020-10-18 DIAGNOSIS — R531 Weakness: Secondary | ICD-10-CM | POA: Diagnosis not present

## 2020-10-18 DIAGNOSIS — E039 Hypothyroidism, unspecified: Secondary | ICD-10-CM | POA: Diagnosis present

## 2020-10-18 DIAGNOSIS — Z79890 Hormone replacement therapy (postmenopausal): Secondary | ICD-10-CM | POA: Diagnosis not present

## 2020-10-18 DIAGNOSIS — Z950 Presence of cardiac pacemaker: Secondary | ICD-10-CM | POA: Diagnosis not present

## 2020-10-18 DIAGNOSIS — K2210 Ulcer of esophagus without bleeding: Secondary | ICD-10-CM | POA: Diagnosis not present

## 2020-10-18 DIAGNOSIS — Z9012 Acquired absence of left breast and nipple: Secondary | ICD-10-CM | POA: Diagnosis not present

## 2020-10-18 DIAGNOSIS — D649 Anemia, unspecified: Secondary | ICD-10-CM

## 2020-10-18 DIAGNOSIS — K2211 Ulcer of esophagus with bleeding: Secondary | ICD-10-CM | POA: Diagnosis present

## 2020-10-18 DIAGNOSIS — I129 Hypertensive chronic kidney disease with stage 1 through stage 4 chronic kidney disease, or unspecified chronic kidney disease: Secondary | ICD-10-CM | POA: Diagnosis present

## 2020-10-18 DIAGNOSIS — E1122 Type 2 diabetes mellitus with diabetic chronic kidney disease: Secondary | ICD-10-CM | POA: Diagnosis present

## 2020-10-18 DIAGNOSIS — D509 Iron deficiency anemia, unspecified: Secondary | ICD-10-CM | POA: Diagnosis not present

## 2020-10-18 DIAGNOSIS — K921 Melena: Secondary | ICD-10-CM

## 2020-10-18 DIAGNOSIS — Z6841 Body Mass Index (BMI) 40.0 and over, adult: Secondary | ICD-10-CM | POA: Diagnosis not present

## 2020-10-18 DIAGNOSIS — Z7902 Long term (current) use of antithrombotics/antiplatelets: Secondary | ICD-10-CM | POA: Diagnosis not present

## 2020-10-18 DIAGNOSIS — I2510 Atherosclerotic heart disease of native coronary artery without angina pectoris: Secondary | ICD-10-CM | POA: Diagnosis present

## 2020-10-18 DIAGNOSIS — E785 Hyperlipidemia, unspecified: Secondary | ICD-10-CM | POA: Diagnosis present

## 2020-10-18 DIAGNOSIS — Z853 Personal history of malignant neoplasm of breast: Secondary | ICD-10-CM | POA: Diagnosis not present

## 2020-10-18 DIAGNOSIS — E538 Deficiency of other specified B group vitamins: Secondary | ICD-10-CM | POA: Diagnosis present

## 2020-10-18 DIAGNOSIS — N179 Acute kidney failure, unspecified: Secondary | ICD-10-CM | POA: Diagnosis not present

## 2020-10-18 DIAGNOSIS — Z7982 Long term (current) use of aspirin: Secondary | ICD-10-CM | POA: Diagnosis not present

## 2020-10-18 DIAGNOSIS — D519 Vitamin B12 deficiency anemia, unspecified: Secondary | ICD-10-CM | POA: Diagnosis not present

## 2020-10-18 DIAGNOSIS — Z794 Long term (current) use of insulin: Secondary | ICD-10-CM | POA: Diagnosis not present

## 2020-10-18 DIAGNOSIS — I495 Sick sinus syndrome: Secondary | ICD-10-CM | POA: Diagnosis present

## 2020-10-18 DIAGNOSIS — Z79899 Other long term (current) drug therapy: Secondary | ICD-10-CM | POA: Diagnosis not present

## 2020-10-18 DIAGNOSIS — K922 Gastrointestinal hemorrhage, unspecified: Secondary | ICD-10-CM | POA: Diagnosis present

## 2020-10-18 DIAGNOSIS — Z20822 Contact with and (suspected) exposure to covid-19: Secondary | ICD-10-CM | POA: Diagnosis present

## 2020-10-18 LAB — GLUCOSE, CAPILLARY
Glucose-Capillary: 141 mg/dL — ABNORMAL HIGH (ref 70–99)
Glucose-Capillary: 240 mg/dL — ABNORMAL HIGH (ref 70–99)
Glucose-Capillary: 174 mg/dL — ABNORMAL HIGH (ref 70–99)
Glucose-Capillary: 172 mg/dL — ABNORMAL HIGH (ref 70–99)

## 2020-10-18 LAB — CBC
HCT: 24.8 % — ABNORMAL LOW (ref 36.0–46.0)
Hemoglobin: 8.3 g/dL — ABNORMAL LOW (ref 12.0–15.0)
MCH: 30.4 pg (ref 26.0–34.0)
MCHC: 33.5 g/dL (ref 30.0–36.0)
MCV: 90.8 fL (ref 80.0–100.0)
Platelets: 238 10*3/uL (ref 150–400)
RBC: 2.73 MIL/uL — ABNORMAL LOW (ref 3.87–5.11)
RDW: 15.7 % — ABNORMAL HIGH (ref 11.5–15.5)
WBC: 6.9 10*3/uL (ref 4.0–10.5)
nRBC: 0 % (ref 0.0–0.2)

## 2020-10-18 LAB — IRON AND TIBC
Iron: 44 ug/dL (ref 28–170)
Saturation Ratios: 19 % (ref 10.4–31.8)
TIBC: 227 ug/dL — ABNORMAL LOW (ref 250–450)
UIBC: 183 ug/dL

## 2020-10-18 LAB — HEMOGLOBIN AND HEMATOCRIT, BLOOD
HCT: 24.1 % — ABNORMAL LOW (ref 36.0–46.0)
HCT: 26.1 % — ABNORMAL LOW (ref 36.0–46.0)
Hemoglobin: 8 g/dL — ABNORMAL LOW (ref 12.0–15.0)
Hemoglobin: 8.5 g/dL — ABNORMAL LOW (ref 12.0–15.0)

## 2020-10-18 LAB — FOLATE: Folate: 10.2 ng/mL (ref >5.9)

## 2020-10-18 LAB — BASIC METABOLIC PANEL
Anion gap: 7 (ref 5–15)
BUN: 48 mg/dL — ABNORMAL HIGH (ref 8–23)
CO2: 24 mmol/L (ref 22–32)
Calcium: 7.9 mg/dL — ABNORMAL LOW (ref 8.9–10.3)
Chloride: 105 mmol/L (ref 98–111)
Creatinine, Ser: 1.58 mg/dL — ABNORMAL HIGH (ref 0.44–1.00)
GFR, Estimated: 32 mL/min — ABNORMAL LOW (ref >60)
Glucose, Bld: 129 mg/dL — ABNORMAL HIGH (ref 70–99)
Potassium: 3.9 mmol/L (ref 3.5–5.1)
Sodium: 136 mmol/L (ref 135–145)

## 2020-10-18 LAB — HEMOGLOBIN A1C
Hgb A1c MFr Bld: 5.4 % (ref 4.8–5.6)
Mean Plasma Glucose: 108.28 mg/dL

## 2020-10-18 LAB — FERRITIN: Ferritin: 35 ng/mL (ref 11–307)

## 2020-10-18 LAB — SARS CORONAVIRUS 2 (TAT 6-24 HRS): SARS Coronavirus 2: NEGATIVE

## 2020-10-18 LAB — VITAMIN B12: Vitamin B-12: 174 pg/mL — ABNORMAL LOW (ref 180–914)

## 2020-10-18 MED ORDER — ACETAMINOPHEN 650 MG RE SUPP: 325.0000 mg | Freq: Four times a day (QID) | RECTAL | Status: AC | PRN

## 2020-10-18 MED ORDER — SODIUM CHLORIDE 0.9 % IV SOLN
300.0000 mg | Freq: Once | INTRAVENOUS | Status: AC
  Administered 2020-10-18: 300 mg via INTRAVENOUS
  Filled 2020-10-18: qty 15

## 2020-10-18 MED ORDER — CYANOCOBALAMIN 1000 MCG/ML IJ SOLN
1000.0000 ug | Freq: Every day | INTRAMUSCULAR | Status: AC
  Administered 2020-10-18 – 2020-10-20 (×3): 1000 ug via SUBCUTANEOUS
  Filled 2020-10-18 (×3): qty 1

## 2020-10-18 MED ORDER — ACETAMINOPHEN 325 MG PO TABS
325.0000 mg | ORAL_TABLET | Freq: Four times a day (QID) | ORAL | Status: AC | PRN
  Administered 2020-10-18 – 2020-10-20 (×3): 325 mg via ORAL
  Filled 2020-10-18 (×3): qty 1

## 2020-10-18 NOTE — Consult Note (Signed)
Cephas Darby, MD [address]  [place], [place] [address]  Main: [phone]  Fax: [phone] Pager: [phone]   Consultation  Referring Provider:     No ref. provider found Primary Care Physician:  Donnie Coffin, MD Primary Gastroenterologist:  Dr. Bonna Gains         Reason for Consultation:     Melena and symptomatic anemia  Date of Admission:  10/17/2020 Date of Consultation:  10/18/2020         HPI:   Regina [name] is a 87 y.o. female metabolic syndrome, coronary disease, diabetes insulin-dependent, history of paroxysmal A. fib, on aspirin and Plavix presents with generalized weakness as well as black stools.  Patient reports that she has been progressively getting weaker which brought her to the hospital.  She denies any abdominal pain.  She does report that she has trouble swallowing solid food.  She says, she does not eat balanced diet at all.  Patient's daughter is bedside.  Labs revealed hemoglobin 7.1, dropped from 11 since December 2020.  She received 1 unit of PRBCs and responded appropriately.  Her hemoglobin today is 8.3, normal platelets.  Worsening of her renal function, BUN/creatinine 48/1.58, increased from 25/1.58.  Patient is started on pantoprazole drip.  Her last dose of Plavix was on 3/7   NSAIDs: None  Antiplts/Anticoagulants/Anti thrombotics: Aspirin and Plavix for history of A. fib and coronary disease  GI Procedures:  Colonoscopy 05/04/2013, report not available Diagnosis:  Part A: TERMINAL ILEUM BIOPSY:  - ILEAL MUCOSA WITH INTACT VILLI.  - NO ACTIVE INFLAMMATION, INTRAEPITHELIAL LYMPHOCYTOSIS, ULCERS  OR GRANULOMAS.  Marland Kitchen  Part B: COLON RANDOM BIOPSY:  - NEGATIVE FOR COLITIS.  - ONE HYPERPLASTIC POLYP.   Past Medical History:  Diagnosis Date  . Breast cancer, left [facility]) 2013   Mastectomy.   . CAD (coronary artery disease)    s/p cath in 2009 with 2 stents  . Diabetes mellitus without complication ([place])   . GERD  (gastroesophageal reflux disease)   . HLD (hyperlipidemia)   . Hypertension   . Hypothyroidism   . Paroxysmal atrial fibrillation [facility])     Past Surgical History:  Procedure Laterality Date  . BREAST LUMPECTOMY    . CARDIAC CATHETERIZATION    . CHOLECYSTECTOMY    . MASTECTOMY Left   . PACEMAKER INSERTION Left 04/21/2019   Procedure: Pacemaker Change Out;  Surgeon: Cletis Athens, MD;  Location: [facility] ORS;  Service: Cardiovascular;  Laterality: Left;  . STENT PLACEMENT VASCULAR ([place] HX)      Prior to Admission medications   Medication Sig Start Date End Date Taking? Authorizing Provider  acetaminophen (TYLENOL) 500 MG tablet Take 500 mg by mouth every 6 (six) hours as needed for mild pain.   Yes [provider]  aspirin EC 81 MG tablet Take 81 mg by mouth daily.   Yes [provider]  bumetanide (BUMEX) 2 MG tablet Take 2 mg by mouth daily.   Yes [provider]  clopidogrel (PLAVIX) 75 MG tablet Take 75 mg by mouth daily.   Yes [provider]  fluticasone (FLONASE) 50 MCG/ACT nasal spray Place 2 sprays into both nostrils daily as needed for allergies.   Yes [provider]  insulin aspart protamine- aspart (NOVOLOG MIX 70/30) (70-30) 100 UNIT/ML injection Inject 0.28 mLs (28 Units total) into the skin 2 (two) times daily with a meal. Patient taking differently: Inject 40 Units into the skin at  bedtime. 02/21/15  Yes Gouru, Illene Silver, MD  levothyroxine (SYNTHROID) 75 MCG tablet Take 75 mcg by mouth daily. 07/22/19  Yes [provider]  lisinopril (PRINIVIL,ZESTRIL) 20 MG tablet Take 20 mg by mouth daily. 08/25/16  Yes [provider]  metoprolol succinate (TOPROL-XL) 50 MG 24 hr tablet Take 50 mg by mouth 2 (two) times daily. Take with or immediately following a meal.   Yes [provider]  oxyCODONE-acetaminophen (PERCOCET/ROXICET) 5-325 MG tablet Take 1 tablet by mouth 2 (two) times daily as needed for severe pain.   Yes  [provider]  paricalcitol (ZEMPLAR) 1 MCG capsule Take 1 mcg by mouth every Monday, Wednesday, and Friday.   Yes [provider]  ranolazine (RANEXA) 500 MG 12 hr tablet Take 500 mg by mouth 2 (two) times daily.   Yes [provider]  rosuvastatin (CRESTOR) 10 MG tablet Take 10 mg by mouth daily.   Yes [provider]    Family History  Problem Relation Age of Onset  . Kidney failure Mother   . Lung cancer Father   . Throat cancer Son      Social History   Tobacco Use  . Smoking status: Never Smoker  . Smokeless tobacco: Never Used  Substance Use Topics  . Alcohol use: No    Alcohol/week: 0.0 standard drinks  . Drug use: No    Allergies as of 10/17/2020  . (No Known Allergies)    Review of Systems:    All systems reviewed and negative except where noted in HPI.   Physical Exam:  Vital signs in last 24 hours: Temp:  [97.8 F (36.6 C)-99.8 F (37.7 C)] 99 F (37.2 C) (03/09 1117) Pulse Rate:  [39-62] 59 (03/09 1117) Resp:  [12-24] 18 (03/09 1117) BP: (81-144)/(31-69) 92/44 (03/09 1145) SpO2:  [99 %-100 %] 100 % (03/09 1117) Weight:  [95 kg] 95 kg (03/09 0601)   General:   Pleasant, cooperative in NAD Head:  Normocephalic and atraumatic. Eyes:   No icterus.   Conjunctiva pink. PERRLA. Ears:  Normal auditory acuity. Neck:  Supple; no masses or thyroidomegaly Lungs: Respirations even and unlabored. Lungs clear to auscultation bilaterally.   No wheezes, crackles, or rhonchi.  Heart:  Regular rate and rhythm;  Without murmur, clicks, rubs or gallops Abdomen:  Soft, nondistended, nontender. Normal bowel sounds. No appreciable masses or hepatomegaly.  No rebound or guarding.  Rectal:  Not performed. Msk:  Symmetrical without gross deformities.  Strength generalized weakness Extremities:  Without edema, cyanosis or clubbing. Neurologic:  Alert and oriented x3;  grossly normal neurologically. Skin:  Intact without significant lesions  or rashes. Psych:  Alert and cooperative. Normal affect.  LAB RESULTS: CBC Latest Ref Rng & Units 10/18/2020 10/18/2020 10/17/2020  WBC 4.0 - 10.5 K/uL - 6.9 8.0  Hemoglobin 12.0 - 15.0 g/dL 8.0(L) 8.3(L) 8.8(L)  Hematocrit 36.0 - 46.0 % 24.1(L) 24.8(L) 26.9(L)  Platelets 150 - 400 K/uL - 238 227    BMET BMP Latest Ref Rng & Units 10/18/2020 10/17/2020 08/11/2019  Glucose 70 - 99 mg/dL 129(H) 138(H) 170(H)  BUN 8 - 23 mg/dL 48(H) 47(H) 25(H)  Creatinine 0.44 - 1.00 mg/dL 1.58(H) 1.68(H) 1.58(H)  Sodium 135 - 145 mmol/L 136 135 139  Potassium 3.5 - 5.1 mmol/L 3.9 5.2(H) 4.3  Chloride 98 - 111 mmol/L 105 105 102  CO2 22 - 32 mmol/L $RemoveB'24 25 25  'YXbAGSFf$ Calcium 8.9 - 10.3 mg/dL 7.9(L) 7.6(L) 8.9    LFT Hepatic Function  Latest Ref Rng & Units 10/17/2020 08/11/2019 08/28/2016  Total Protein 6.5 - 8.1 g/dL 4.7(L) - 6.2(L)  Albumin 3.5 - 5.0 g/dL 2.4(L) 3.6 2.8(L)  AST 15 - 41 U/L 30 - 26  ALT 0 - 44 U/L 18 - 11(L)  Alk Phosphatase 38 - 126 U/L 53 - 51  Total Bilirubin 0.3 - 1.2 mg/dL 0.7 - 0.5     STUDIES: DG Chest 2 View  Result Date: 10/17/2020 CLINICAL DATA:  Weakness. EXAM: CHEST - 2 VIEW COMPARISON:  04/18/2019. FINDINGS: Cardiac pacer stable position. Heart size normal. Questionable nodular density left upper lung. This could be overlying the patient. PA lateral chest x-ray suggested further evaluation. No focal infiltrate. No pleural effusion or pneumothorax. No acute bony abnormality. Degenerative change thoracic spine. IMPRESSION: 1. Questionable nodular density left upper lung. This could be overlying the patient. PA and lateral chest x-ray suggested further evaluation. 2. Cardiac pacer stable position. Heart size normal. No acute pulmonary disease. Electronically Signed   By: Marcello Moores  Register   On: 10/17/2020 13:38      Impression / Plan:   Regina [name] is a 87 y.o. female with metabolic syndrome, coronary disease, paroxysmal A. fib on aspirin and Plavix is admitted with melena and severe  symptomatic anemia. Melena and anemia Continue pantoprazole drip Monitor CBC closely and maintain hemoglobin above 8 Okay to advance diet today Last dose of Plavix on 3/7, tentative plan to perform EGD after 3 days of discontinuation of Plavix which will be 3/11 Iron studies reveal iron deficiency, 1 dose of Venofer ordered B12 deficiency, recommend B12 injections daily  Discussed my recommendations with both patient and her daughter  Thank you for involving me in the care of this patient.      LOS: 0 days   Sherri Sear, MD  10/18/2020, 4:13 PM   Note: This dictation was prepared with Dragon dictation along with smaller phrase technology. Any transcriptional errors that result from this process are unintentional.

## 2020-10-18 NOTE — Progress Notes (Signed)
PROGRESS NOTE    Regina [name]  AJO:[id number] DOB: 14-Oct-1933 DOA: 10/17/2020 PCP: Donnie Coffin, MD   Brief Narrative: Taken from H&P. Regina [name] is a 87 y.o. female with medical history significant for GERD, chronic low back pain, sick sinus syndrome status post pacemaker, hypothyroid, insulin-dependent diabetes mellitus, CAD, presented to the emergency department for chief concerns of weakness and dizziness.  She was having intermittent dark-colored stools for some time, unable to specify the duration.  Had couple of them back to back yesterday.  Patient was on aspirin and Plavix. Per patient she did had 2 loose black-colored bowel movements overnight but no documentation. Admitted for symptomatic anemia secondary to upper GI bleed with hemoglobin of 7.1, received 1 unit of PRBC with improvement in hemoglobin to 8.8, trending down to 8.3 this morning. GI was consulted.  Subjective: Patient was lying comfortably when seen today.  Denies any NSAID use.  She was taking aspirin and Plavix.  She was having intermittent black-colored stools for some time, not sure about the duration.  Felt dizzy and weak after having couple of back-to-back loose black-colored bowel movements yesterday.  Per patient she did had to black color bowel movements overnight.  Assessment & Plan:   Principal Problem:   Acute upper GI bleeding Active Problems:   Type 2 diabetes mellitus (HCC)   Essential hypertension   HLD (hyperlipidemia)   Paroxysmal atrial fibrillation (HCC)   GERD (gastroesophageal reflux disease)   Pacemaker   History of cancer of left breast   Sick sinus syndrome due to SA node dysfunction (HCC)  Symptomatic anemia secondary to upper GI bleed.  Hemoglobin improved to 8.8 after getting 1 unit of PRBC, decreased to 8.3 this morning.  Anemia panel consistent with anemia of chronic disease. Gastroenterology was consulted-will appreciate their recommendations. -Continue with Protonix  infusion. -Monitor hemoglobin -Transfuse if below 8. -Keep holding aspirin and Plavix-CAD s/p PCI in 2009 as mentioned in past medical history, no other documentation, could not find another reason to stay on DAPT.  Hypertension.  Blood pressure soft this morning. -Hold home dose of lisinopril, metoprolol and Bumex. -We will resume as tolerated.  Paroxysmal atrial fibrillation.  Patient has an history of prior GI bleed per cardiology note but could not find any documentation.  Not a candidate for anticoagulation.  Currently rate well controlled. -Holding home dose of metoprolol for softer blood pressure-can resume it if heart rate trending up.  Sick sinus syndrome S/p pacemaker.  No acute concern. -She will continue outpatient follow-up for pacemaker interrogation with cardiology.  Hyperlipidemia. -Continue home dose of statin.  Insulin-dependent diabetes mellitus.  A1c of 5.4.  CBG mostly in 200s.  Patient was on 70/30 at home. -Continue with SSI  Hypothyroidism. -Continue home dose of Synthroid  Objective: Vitals:   10/18/20 0601 10/18/20 0825 10/18/20 1117 10/18/20 1145  BP: (!) 104/41 (!) 81/47 (!) 87/31 (!) 92/44  Pulse: (!) 59 60 (!) 59   Resp: 18 18 18    Temp: 98.7 F (37.1 C) 99.8 F (37.7 C) 99 F (37.2 C)   TempSrc: Oral Oral Oral   SpO2: 99% 100% 100%   Weight: 95 kg     Height:        Intake/Output Summary (Last 24 hours) at 10/18/2020 1248 Last data filed at 10/18/2020 0601 Gross per 24 hour  Intake 340 ml  Output 0 ml  Net 340 ml   Filed Weights   10/17/20 1233 10/17/20 2012 10/18/20 0601  Weight: 93.9 kg 95 kg 95 kg    Examination:  General exam: Appears calm and comfortable  Respiratory system: Clear to auscultation. Respiratory effort normal. Cardiovascular system: S1 & S2 heard, RRR. No JVD, murmurs, rubs, gallops or clicks. Gastrointestinal system: Soft, nontender, nondistended, bowel sounds positive. Central nervous system: Alert and oriented.  No focal neurological deficits. Extremities: No edema, no cyanosis, pulses intact and symmetrical. Psychiatry: Judgement and insight appear normal. Mood & affect appropriate.    DVT prophylaxis: SCDs Code Status: Full Family Communication: Discussed with patient Disposition Plan:  Status is: Inpatient  Remains inpatient appropriate because:Inpatient level of care appropriate due to severity of illness   Dispo: The patient is from: Home              Anticipated d/c is to: Home              Patient currently is not medically stable to d/c.   Difficult to place patient No               Level of care: Progressive Cardiac  All the records are reviewed and case discussed with Care Management/Social Worker. Management plans discussed with the patient, nursing and they are in agreement.  Consultants:   GI  Procedures:  Antimicrobials:   Data Reviewed: I have personally reviewed following labs and imaging studies  CBC: Recent Labs  Lab 10/17/20 1242 10/17/20 1913 10/18/20 0629  WBC 9.0 8.0 6.9  NEUTROABS 6.6  --   --   HGB 7.1* 8.8* 8.3*  HCT 22.5* 26.9* 24.8*  MCV 97.4 92.1 90.8  PLT 262 227 630   Basic Metabolic Panel: Recent Labs  Lab 10/17/20 1242 10/18/20 0629  NA 135 136  K 5.2* 3.9  CL 105 105  CO2 25 24  GLUCOSE 138* 129*  BUN 47* 48*  CREATININE 1.68* 1.58*  CALCIUM 7.6* 7.9*   GFR: Estimated Creatinine Clearance: 26.3 mL/min (A) (by C-G formula based on SCr of 1.58 mg/dL (H)). Liver Function Tests: Recent Labs  Lab 10/17/20 1242  AST 30  ALT 18  ALKPHOS 53  BILITOT 0.7  PROT 4.7*  ALBUMIN 2.4*   No results for input(s): LIPASE, AMYLASE in the last 168 hours. No results for input(s): AMMONIA in the last 168 hours. Coagulation Profile: No results for input(s): INR, PROTIME in the last 168 hours. Cardiac Enzymes: No results for input(s): CKTOTAL, CKMB, CKMBINDEX, TROPONINI in the last 168 hours. BNP (last 3 results) No results for input(s):  PROBNP in the last 8760 hours. HbA1C: Recent Labs    10/17/20 1913  HGBA1C 5.4   CBG: Recent Labs  Lab 10/17/20 2036 10/18/20 0815 10/18/20 1218  GLUCAP 226* 141* 240*   Lipid Profile: No results for input(s): CHOL, HDL, LDLCALC, TRIG, CHOLHDL, LDLDIRECT in the last 72 hours. Thyroid Function Tests: No results for input(s): TSH, T4TOTAL, FREET4, T3FREE, THYROIDAB in the last 72 hours. Anemia Panel: Recent Labs    10/17/20 1242 10/18/20 0629  VITAMINB12 174*  --   FOLATE  --  10.2  FERRITIN  --  35  TIBC  --  227*  IRON  --  44   Sepsis Labs: No results for input(s): PROCALCITON, LATICACIDVEN in the last 168 hours.  Recent Results (from the past 240 hour(s))  SARS CORONAVIRUS 2 (TAT 6-24 HRS) Nasopharyngeal Nasopharyngeal Swab     Status: None   Collection Time: 10/17/20  2:09 PM   Specimen: Nasopharyngeal Swab  Result Value Ref Range Status  SARS Coronavirus 2 NEGATIVE NEGATIVE Final    Comment: (NOTE) SARS-CoV-2 target nucleic acids are NOT DETECTED.  The SARS-CoV-2 RNA is generally detectable in upper and lower respiratory specimens during the acute phase of infection. Negative results do not preclude SARS-CoV-2 infection, do not rule out co-infections with other pathogens, and should not be used as the sole basis for treatment or other patient management decisions. Negative results must be combined with clinical observations, patient history, and epidemiological information. The expected result is Negative.  Fact Sheet for Patients: SugarRoll.be  Fact Sheet for Healthcare Providers: https://www.woods-mathews.com/  This test is not yet approved or cleared by the [place] FDA and  has been authorized for detection and/or diagnosis of SARS-CoV-2 by FDA under an Emergency Use Authorization (EUA). This EUA will remain  in effect (meaning this test can be used) for the duration of the COVID-19 declaration under Se  ction 564(b)(1) of the Act, 21 U.S.C. section 360bbb-3(b)(1), unless the authorization is terminated or revoked sooner.  Performed at [facility], [place] [address], [place], [place] [address]      Radiology Studies: DG Chest 2 View  Result Date: 10/17/2020 CLINICAL DATA:  Weakness. EXAM: CHEST - 2 VIEW COMPARISON:  04/18/2019. FINDINGS: Cardiac pacer stable position. Heart size normal. Questionable nodular density left upper lung. This could be overlying the patient. PA lateral chest x-ray suggested further evaluation. No focal infiltrate. No pleural effusion or pneumothorax. No acute bony abnormality. Degenerative change thoracic spine. IMPRESSION: 1. Questionable nodular density left upper lung. This could be overlying the patient. PA and lateral chest x-ray suggested further evaluation. 2. Cardiac pacer stable position. Heart size normal. No acute pulmonary disease. Electronically Signed   By: Marcello Moores  Register   On: 10/17/2020 13:38    Scheduled Meds: . bumetanide  2 mg Oral Daily  . insulin aspart  0-15 Units Subcutaneous TID WC  . insulin aspart  0-5 Units Subcutaneous QHS  . levothyroxine  75 mcg Oral Daily  . lisinopril  20 mg Oral Daily  . metoprolol succinate  50 mg Oral BID  . ranolazine  500 mg Oral BID  . rosuvastatin  10 mg Oral Daily   Continuous Infusions: . pantoprozole (PROTONIX) infusion 8 mg/hr (10/18/20 1245)     LOS: 0 days   Time spent: 35 minutes. More than 50% of the time was spent in counseling/coordination of care  Lorella Nimrod, MD [facility]  If 7PM-7AM, please contact night-coverage [email or web address]  10/18/2020, 12:48 PM   This record has been created using Systems analyst. Errors have been sought and corrected,but may not always be located. Such creation errors do not reflect on the standard of care.

## 2020-10-18 NOTE — Progress Notes (Signed)
Mobility Specialist - Progress Note   10/18/20 1500  Mobility  Activity Stood at bedside  Level of Assistance Moderate assist, patient does 50-74%  Assistive Device Front wheel walker  Distance Ambulated (ft) 0 ft  Mobility Response Tolerated well  Mobility performed by Mobility specialist  $Mobility charge 1 Mobility    Pt received in bed on room air. Pt agreed to session. Pt Aox4. Pt reports pain in back and BUE (mostly L side with movement). Hyper-verbal. Pt with generalized weakness in all extremities. Pt reports she was getting around with RW PTA. Pt states she has decreased feeling in R hand/fingers and that she has help with opening containers, etc. Extra time needed to press down on buttons via tv remote d/t decreased strength using L hand. Pt was able to get EOB with maxA, with heavy support for all extremities. Extra time and cueing required to shift hips for foot placement on floor. No c/o dizziness at this time. Pt stood at bedside 2x with minA. Does voice weakness upon standing. Pt does require maxA +2 to return supine. RN entered for assist. Pt c/o pain when gently touching back of LE for return to supine assistance. Pt may benefit from a lower bed for OOB transfers to return to sitting position. Overall, pt tolerated session well. Pt was left in bed with all needs in reach and alarm set. Nurse notified.    Kathee Delton Mobility Specialist 10/18/20, 4:01 PM

## 2020-10-19 DIAGNOSIS — D519 Vitamin B12 deficiency anemia, unspecified: Secondary | ICD-10-CM | POA: Diagnosis not present

## 2020-10-19 DIAGNOSIS — D509 Iron deficiency anemia, unspecified: Secondary | ICD-10-CM

## 2020-10-19 DIAGNOSIS — K922 Gastrointestinal hemorrhage, unspecified: Secondary | ICD-10-CM | POA: Diagnosis not present

## 2020-10-19 LAB — GLUCOSE, CAPILLARY
Glucose-Capillary: 200 mg/dL — ABNORMAL HIGH (ref 70–99)
Glucose-Capillary: 267 mg/dL — ABNORMAL HIGH (ref 70–99)
Glucose-Capillary: 137 mg/dL — ABNORMAL HIGH (ref 70–99)
Glucose-Capillary: 276 mg/dL — ABNORMAL HIGH (ref 70–99)

## 2020-10-19 LAB — HEMOGLOBIN AND HEMATOCRIT, BLOOD
HCT: 27.2 % — ABNORMAL LOW (ref 36.0–46.0)
HCT: 24.1 % — ABNORMAL LOW (ref 36.0–46.0)
Hemoglobin: 8.3 g/dL — ABNORMAL LOW (ref 12.0–15.0)
Hemoglobin: 8.2 g/dL — ABNORMAL LOW (ref 12.0–15.0)

## 2020-10-19 LAB — BASIC METABOLIC PANEL
Anion gap: 7 (ref 5–15)
BUN: 48 mg/dL — ABNORMAL HIGH (ref 8–23)
CO2: 23 mmol/L (ref 22–32)
Calcium: 8 mg/dL — ABNORMAL LOW (ref 8.9–10.3)
Chloride: 106 mmol/L (ref 98–111)
Creatinine, Ser: 1.77 mg/dL — ABNORMAL HIGH (ref 0.44–1.00)
GFR, Estimated: 28 mL/min — ABNORMAL LOW (ref >60)
Glucose, Bld: 207 mg/dL — ABNORMAL HIGH (ref 70–99)
Potassium: 4.4 mmol/L (ref 3.5–5.1)
Sodium: 136 mmol/L (ref 135–145)

## 2020-10-19 MED ORDER — LIDOCAINE 5 % EX PTCH
1.0000 | MEDICATED_PATCH | CUTANEOUS | Status: DC
  Administered 2020-10-19 – 2020-10-25 (×5): 1 via TRANSDERMAL
  Filled 2020-10-19 (×8): qty 1

## 2020-10-19 MED ORDER — SODIUM CHLORIDE 0.9 % IV SOLN
300.0000 mg | Freq: Once | INTRAVENOUS | Status: AC
  Administered 2020-10-20: 300 mg via INTRAVENOUS
  Filled 2020-10-19: qty 15

## 2020-10-19 MED ORDER — SODIUM CHLORIDE 0.9 % IV BOLUS
500.0000 mL | Freq: Once | INTRAVENOUS | Status: AC
  Administered 2020-10-19: 500 mL via INTRAVENOUS

## 2020-10-19 MED ORDER — SODIUM CHLORIDE 0.9 % IV SOLN: 300.0000 mg | Freq: Once | INTRAVENOUS | Status: DC

## 2020-10-19 NOTE — Progress Notes (Signed)
Inpatient Diabetes Program Recommendations  AACE/ADA: New Consensus Statement on Inpatient Glycemic Control (2015)  Target Ranges:  Prepandial:   less than 140 mg/dL      Peak postprandial:   less than 180 mg/dL (1-2 hours)      Critically ill patients:  140 - 180 mg/dL   Results for GLENDON, FISER (MRN [id number]) as of 10/19/2020 13:44  Ref. Range 10/18/2020 08:15 10/18/2020 12:18 10/18/2020 16:43 10/18/2020 20:57  Glucose-Capillary Latest Ref Range: 70 - 99 mg/dL 141 (H) 240 (H) 174 (H) 172 (H)   Results for MELAYAH, SKORUPSKI (MRN [id number]) as of 10/19/2020 13:44  Ref. Range 10/19/2020 09:41 10/19/2020 11:39  Glucose-Capillary Latest Ref Range: 70 - 99 mg/dL 200 (H) 267 (H)    Admit with: Symptomatic anemia secondary to upper GI bleed  History: DM  Home DM Meds: 70/30 Insulin 40 units QPM  Current Orders: Novolog Moderate Correction Scale/ SSI (0-15 units) TID AC + HS     MD- Note patient takes 70/30 Insulin at home  Please consider adding basal insulin to inpatient regimen and then can resume 70/30 insulin when pt ready for discharge.  Recommend Levemir 10 units Daily (0.1 units/kg)    --Will follow patient during hospitalization--  Wyn Quaker RN, MSN, CDE Diabetes Coordinator Inpatient Glycemic Control Team Team Pager: [phone] (8a-5p)

## 2020-10-19 NOTE — Evaluation (Addendum)
Physical Therapy Evaluation Patient Details Name: Regina [name] MRN: [id number] DOB: Apr 08, 1934 Today's Date: 10/19/2020   History of Present Illness  Regina [name] is an 33yoF who comes to [facility] c symptomatic anemia, found to have GIB, now s/p PRBC infusion, albeit pressures remain low 90s/50s. PMH: GERD, low back pain, SSS s/p PPM, hypothyroidism, IDDM, CAD. At baseline pt lives alone, limited household distance AMB c fixed objects or RW, needs support daily for ADL performance.  Clinical Impression  Pt admitted with above diagnosis. Pt currently with functional limitations due to the deficits listed below (see "PT Problem List"). Upon entry, pt in bed, awake and agreeable to participate. The pt is alert, pleasant, interactive, and able to provide info regarding prior level of function, both in tolerance and independence. Pt continues to feel quite weak and thus uninterested in attempting AMB or getting up to chair. Pt cites weakness and malaise as reasons to avoid bed mobility, thus educated on role of PT evaluation and progressing mobility daily to prevent further decline. Pt requires modA for bed mobility (typically independent at home), minA for coming to standing (independent at home), and takes a few steps, but ultimately feels too weak to attempt household AMB distances at this time. Patient's performance this date reveals decreased ability, independence, and tolerance in performing all basic mobility required for performance of activities of daily living. Pt requires additional DME, close physical assistance, and cues for safe participate in mobility. Pt will benefit from skilled PT intervention to increase independence and safety with basic mobility in preparation for discharge to the venue listed below.    Set up pt with breakfast tray at end of session, requests to sit reclined in bed for meal due to back pain concerns at EOB. Pt has visual impairment and fine motor impairment of hands that  requires assistsance with meal set up, struggles with opening condiments and beverage containers. Pt is edentulous and unable to chew foods, hence unable to eat flapjacks/sausage patty, but does commence with oatmealing prior to exit. Pt also struggles with effective use of callbell. RN/NA made aware- OT order placed.     Follow Up Recommendations Supervision/Assistance - 24 hour;SNF;Supervision for mobility/OOB (Unclear if pt is agreeable to STR; unclear if additional help is available. She would need 24/7 assist for safe DC to home.)    Equipment Recommendations  None recommended by PT    Recommendations for Other Services       Precautions / Restrictions Precautions Precautions: Fall Precaution Comments: active GIB Restrictions Weight Bearing Restrictions: No      Mobility  Bed Mobility Overal bed mobility: Needs Assistance Bed Mobility: Supine to Sit;Sit to Supine;Rolling Rolling: Max assist   Supine to sit: Mod assist Sit to supine: Mod assist   General bed mobility comments: modA for scoot in trendelenburg (author stabilizes feet for bridge)    Transfers Overall transfer level: Needs assistance Equipment used:  (YRW) Transfers: Sit to/from [facility] Sit to Stand: Min assist   Squat pivot transfers:  (pt not agreeable, reports she is still too sick to go to chair. Not ready yet.)     General transfer comment: multiple attempts, false starts, rises 85% but cannot establish full upright and balance without minA at top  Ambulation/Gait Ambulation/Gait assistance:  (Not agreeable, focus is getting to breakfast while hot.) Gait Distance (Feet): 3 Feet (takes a few sidesteps to center of bed)            Stairs  Wheelchair Mobility    Modified Rankin (Stroke Patients Only)       Balance Overall balance assessment:  (In past 6 months estimates ~2-3.)                                           Pertinent  Vitals/Pain Pain Assessment:  (chronic baseline neck, back and Left shoulder pain.)    Home Living Family/patient expects to be discharged to:: Private residence Living Arrangements: Alone   Type of Home: House Home Access: Ramped entrance     Home Layout: One [place]: Environmental consultant - 4 wheels      Prior Function Level of Independence: Needs assistance   Gait / Transfers Assistance Needed: household AMB only; modI with dressing (physical unable to open drawers or get things out of closet); can sponge bath; Aids keeps BR supplied with wipe, toilet tissue, pullups  ADL's / Homemaking Assistance Needed: DTR brings groceries, pays bills' pt can make light meals and such but does nto cook anymore  Comments: has an aide in the home for 2 hours each [place] to help with some housework, bathing (unable to obtain assitance SAt and sun due to staffing); aide helps with medications, laundry.     Hand Dominance        Extremity/Trunk Assessment   Upper Extremity Assessment Upper Extremity Assessment: Generalized weakness (baseline neuropathy struggles with fine motor tasks; pain in shoudler inhibits gross motor tasks)    Lower Extremity Assessment Lower Extremity Assessment: Generalized weakness       Communication      Cognition Arousal/Alertness: Awake/alert Behavior During Therapy: WFL for tasks assessed/performed;Anxious Overall Cognitive Status: Within Functional Limits for tasks assessed                                        General Comments      Exercises     Assessment/Plan    PT Assessment Patient needs continued PT services  PT Problem List Decreased activity tolerance;Decreased balance;Decreased mobility;Decreased strength       PT Treatment Interventions DME instruction;Gait training;Functional mobility training;Therapeutic activities;Therapeutic exercise;Patient/family education    PT Goals (Current goals can be found in the Care Plan  section)  Acute Rehab PT Goals Patient Stated Goal: regain strength, return to home PT Goal Formulation: With patient Time For Goal Achievement: 11/02/20 Potential to Achieve Goals: Fair    Frequency Min 2X/week   Barriers to discharge Decreased caregiver support limited help at home    Co-evaluation               AM-PAC PT "6 Clicks" Mobility  Outcome Measure Help needed turning from your back to your side while in a flat bed without using bedrails?: A Lot Help needed moving from lying on your back to sitting on the side of a flat bed without using bedrails?: A Lot Help needed moving to and from a bed to a chair (including a wheelchair)?: A Lot Help needed standing up from a chair using your arms (e.g., wheelchair or bedside chair)?: A Lot Help needed to walk in hospital room?: Total Help needed climbing 3-5 steps with a railing? : Total 6 Click Score: 10    End of Session Equipment Utilized During Treatment: Gait belt Activity Tolerance: Patient tolerated treatment well;Patient  limited by fatigue;Patient limited by pain Patient left: in bed;with call bell/phone within reach;with nursing/sitter in room Nurse Communication: Mobility status PT Visit Diagnosis: Difficulty in walking, not elsewhere classified (R26.2);Muscle weakness (generalized) (M62.81)    Time: 2761-8485 PT Time Calculation (min) (ACUTE ONLY): 41 min   Charges:   PT Evaluation $PT Eval Moderate Complexity: 1 Mod PT Treatments $Therapeutic Exercise: 8-22 mins        10:20 AM, 10/19/20 Etta Grandchild, PT, DPT Physical Therapist - [facility]  [phone] ([place])    [place] 10/19/2020, 10:11 AM

## 2020-10-19 NOTE — Progress Notes (Signed)
Pt had 1 small dark/black BM this shift. Hemoglobin stable. Will continue to monitor.

## 2020-10-19 NOTE — Evaluation (Signed)
Occupational Therapy Evaluation Patient Details Name: Regina [name] MRN: [id number] DOB: Apr 01, 1934 Today's Date: 10/19/2020    History of Present Illness Marvie Deakins is an 65yoF who comes to [facility] c symptomatic anemia, found to have GIB, now s/p PRBC infusion, albeit pressures remain low 90s/50s. PMH: GERD, low back pain, SSS s/p PPM, hypothyroidism, IDDM, CAD. At baseline pt lives alone, limited household distance AMB c fixed objects or RW, needs support daily for ADL performance.   Clinical Impression   Ms Lewman was seen for OT evaluation this date. Prior to hospital admission, pt was MOD I for mobility and ADLs using 4WW. Pt lives alone with PCA available 5days/week for 2hrs/[place]. Pt presents to acute OT demonstrating impaired ADL performance and functional mobility 2/2 decreased activity tolerance, functional strength/ROM/balance deficits, and poor safety awareness. Pt currently requires MAX A for LBD seated EOB. MIN A + RW for ADL t/f. SETUP self-feeding reclined in chair - assist to chop food, open packages, and transfer meal into bowl. Extensive education given on adaptive dining equipment, OT role, importance of mobility for functional strengthening, and discharge recommendations. Pt insistent that she will not go to STR but will d/c home. Pt would benefit from skilled OT to address noted impairments and functional limitations (see below for any additional details) in order to maximize safety and independence while minimizing falls risk and caregiver burden. Upon hospital discharge, recommend STR to maximize pt safety and return to PLOF.     Follow Up Recommendations  SNF    Equipment Recommendations  Other (comment) (TBD)    Recommendations for Other Services       Precautions / Restrictions Precautions Precautions: Fall Precaution Comments: active GIB Restrictions Weight Bearing Restrictions: No      Mobility Bed Mobility Overal bed mobility: Needs Assistance Bed Mobility:  Supine to Sit     Supine to sit: Min assist     General bed mobility comments: HOB elevated, bed rails, and MIN HHA    Transfers Overall transfer level: Needs assistance Equipment used:  (YRW) Transfers: Sit to/from [employer] to Stand: Min assist Stand pivot transfers: Min assist            Balance Overall balance assessment: Needs assistance Sitting-balance support: No upper extremity supported;Feet supported Sitting balance-Leahy Scale: Fair     Standing balance support: Bilateral upper extremity supported Standing balance-Leahy Scale: Poor                             ADL either performed or assessed with clinical judgement   ADL Overall ADL's : Needs assistance/impaired                                       General ADL Comments: MAX A for LBD seated EOB. MIN A + RW for ADL t/f. SETUP self-feeding reclined in chair - assist to chop food, open packages, and transfer meal into bowl.                  Pertinent Vitals/Pain Pain Assessment: No/denies pain     Hand Dominance Right   Extremity/Trunk Assessment Upper Extremity Assessment Upper Extremity Assessment:  (baseline neuropathy struggles with fine motor tasks; pain in shoudler inhibits gross motor tasks)   Lower Extremity Assessment Lower Extremity Assessment: Generalized weakness       Communication Communication Communication:  HOH   Cognition Arousal/Alertness: Awake/alert Behavior During Therapy: WFL for tasks assessed/performed;Anxious Overall Cognitive Status: Within Functional Limits for tasks assessed                                     General Comments  Supine: BP 82/42 - taken in L ankle    Exercises Exercises: Other exercises Other Exercises Other Exercises: Pt educated re: OT role, DME recs, d/c recs, falls prevetion, importance of mobility for funcitonal strengthening, adapted dining equipment Other Exercises: LBD,  self-feeding, sup>sit, sit<>stand, SPT, sitting/standing balance/tolerance   Shoulder Instructions      Home Living Family/patient expects to be discharged to:: Private residence Living Arrangements: Alone Available Help at Discharge: Personal care attendant Type of Home: House Home Access: Ramped entrance     Home Layout: One level               Home Equipment: Walker - 4 wheels          Prior Functioning/Environment Level of Independence: Needs assistance  Gait / Transfers Assistance Needed: household AMB only; modI with dressing (physical unable to open drawers or get things out of closet); can sponge bath; Aids keeps BR supplied with wipe, toilet tissue, pullups ADL's / Homemaking Assistance Needed: DTR brings groceries, pays bills' pt can make light meals and such but does not cook anymore   Comments: has an aide in the home for 2 hours each [place] to help with some housework, bathing (unable to obtain assitance sat and sun due to staffing); aide helps with medications, laundry.        OT Problem List: Decreased strength;Decreased range of motion;Decreased activity tolerance;Impaired balance (sitting and/or standing);Decreased safety awareness;Impaired UE functional use      OT Treatment/Interventions: Self-care/ADL training;Therapeutic exercise;DME and/or AE instruction;Energy conservation;Therapeutic activities;Patient/family education;Balance training    OT Goals(Current goals can be found in the care plan section) Acute Rehab OT Goals Patient Stated Goal: regain strength, return to home OT Goal Formulation: With patient Time For Goal Achievement: 11/02/20 Potential to Achieve Goals: Good ADL Goals Pt Will Perform Grooming: with set-up;with supervision;standing (c LRAD PRN) Pt Will Perform Lower Body Dressing: with mod assist;sitting/lateral leans Pt Will Transfer to Toilet: with modified independence;ambulating;regular height toilet (c LRAD PRN)  OT Frequency:  Min 1X/week   Barriers to D/C: Decreased caregiver support             AM-PAC OT "6 Clicks" Daily Activity     Outcome Measure Help from another person eating meals?: A Little Help from another person taking care of personal grooming?: A Little Help from another person toileting, which includes using toliet, bedpan, or urinal?: A Lot Help from another person bathing (including washing, rinsing, drying)?: A Lot Help from another person to put on and taking off regular upper body clothing?: A Little Help from another person to put on and taking off regular lower body clothing?: A Lot 6 Click Score: 15   End of Session Equipment Utilized During Treatment: Rolling walker Nurse Communication: Mobility status;Other (comment) (chair alarm unable to be plugged in to nurse call system 2/2 light touch call bell, chair alarm on and RN/secretary aware)  Activity Tolerance: Patient tolerated treatment well Patient left: in chair;with call bell/phone within reach;with chair alarm set  OT Visit Diagnosis: Other abnormalities of gait and mobility (R26.89);Muscle weakness (generalized) (M62.81)  Time: 5379-4327 OT Time Calculation (min): 43 min Charges:  OT General Charges $OT Visit: 1 Visit OT Evaluation $OT Eval Low Complexity: 1 Low OT Treatments $Self Care/Home Management : 23-37 mins  Dessie Coma, M.S. OTR/L  10/19/20, 2:07 PM  ascom [phone]

## 2020-10-19 NOTE — Progress Notes (Signed)
Regina Darby, MD [address]  [place]  [place], [place] [address]  Main: [phone]  Fax: [phone] Pager: [phone]   Subjective: Patient is sitting up in chair, reports having 1 black stool last night.  Hemoglobin is stable.  On pantoprazole drip, tolerating p.o. well   Objective: Vital signs in last 24 hours: Vitals:   10/19/20 1004 10/19/20 1136 10/19/20 1650 10/19/20 1705  BP: (!) 91/24 (!) 82/42 (!) 116/100 (!) 104/56  Pulse: 60 60 60   Resp: 18     Temp:  98.3 F (36.8 C) 98 F (36.7 C)   TempSrc:  Oral Oral   SpO2: 100% 100% 100%   Weight:      Height:       Weight change: 1.005 kg  Intake/Output Summary (Last 24 hours) at 10/19/2020 1745 Last data filed at 10/19/2020 1330 Gross per 24 hour  Intake 1042.49 ml  Output 800 ml  Net 242.49 ml     Exam: Heart:: Regular rate and rhythm, S1S2 present or without murmur or extra heart sounds Lungs: normal and clear to auscultation Abdomen: soft, nontender, normal bowel sounds   Lab Results: CBC Latest Ref Rng & Units 10/19/2020 10/19/2020 10/18/2020  WBC 4.0 - 10.5 K/uL - - -  Hemoglobin 12.0 - 15.0 g/dL 8.2(L) 8.3(L) 8.5(L)  Hematocrit 36.0 - 46.0 % 24.1(L) 27.2(L) 26.1(L)  Platelets 150 - 400 K/uL - - -   CMP Latest Ref Rng & Units 10/19/2020 10/18/2020 10/17/2020  Glucose 70 - 99 mg/dL 207(H) 129(H) 138(H)  BUN 8 - 23 mg/dL 48(H) 48(H) 47(H)  Creatinine 0.44 - 1.00 mg/dL 1.77(H) 1.58(H) 1.68(H)  Sodium 135 - 145 mmol/L 136 136 135  Potassium 3.5 - 5.1 mmol/L 4.4 3.9 5.2(H)  Chloride 98 - 111 mmol/L 106 105 105  CO2 22 - 32 mmol/L 23 24 25   Calcium 8.9 - 10.3 mg/dL 8.0(L) 7.9(L) 7.6(L)  Total Protein 6.5 - 8.1 g/dL - - 4.7(L)  Total Bilirubin 0.3 - 1.2 mg/dL - - 0.7  Alkaline Phos 38 - 126 U/L - - 53  AST 15 - 41 U/L - - 30  ALT 0 - 44 U/L - - 18    Micro Results: Recent Results (from the past 240 hour(s))  SARS CORONAVIRUS 2 (TAT 6-24 HRS) Nasopharyngeal Nasopharyngeal Swab      Status: None   Collection Time: 10/17/20  2:09 PM   Specimen: Nasopharyngeal Swab  Result Value Ref Range Status   SARS Coronavirus 2 NEGATIVE NEGATIVE Final    Comment: (NOTE) SARS-CoV-2 target nucleic acids are NOT DETECTED.  The SARS-CoV-2 RNA is generally detectable in upper and lower respiratory specimens during the acute phase of infection. Negative results do not preclude SARS-CoV-2 infection, do not rule out co-infections with other pathogens, and should not be used as the sole basis for treatment or other patient management decisions. Negative results must be combined with clinical observations, patient history, and epidemiological information. The expected result is Negative.  Fact Sheet for Patients: SugarRoll.be  Fact Sheet for Healthcare Providers: https://www.woods-mathews.com/  This test is not yet approved or cleared by the [place] FDA and  has been authorized for detection and/or diagnosis of SARS-CoV-2 by FDA under an Emergency Use Authorization (EUA). This EUA will remain  in effect (meaning this test can be used) for the duration of the COVID-19 declaration under Se ction 564(b)(1) of the Act, 21 U.S.C. section 360bbb-3(b)(1), unless the authorization is terminated or revoked sooner.  Performed at [facility], [place] [address], [place], [place] [address]    Studies/Results: No results found. Medications:  I have reviewed the patient's current medications. Prior to Admission:  Medications Prior to Admission  Medication Sig Dispense Refill Last Dose  . acetaminophen (TYLENOL) 500 MG tablet Take 500 mg by mouth every 6 (six) hours as needed for mild pain.   Unknown at PRN  . aspirin EC 81 MG tablet Take 81 mg by mouth daily.     . bumetanide (BUMEX) 2 MG tablet Take 2 mg by mouth daily.   10/16/2020 at 0800  . clopidogrel (PLAVIX) 75 MG tablet Take 75 mg by mouth daily.   10/16/2020 at 0800  . fluticasone  (FLONASE) 50 MCG/ACT nasal spray Place 2 sprays into both nostrils daily as needed for allergies.   Unknown at PRN  . insulin aspart protamine- aspart (NOVOLOG MIX 70/30) (70-30) 100 UNIT/ML injection Inject 0.28 mLs (28 Units total) into the skin 2 (two) times daily with a meal. (Patient taking differently: Inject 40 Units into the skin at bedtime.) 10 mL 11 10/16/2020 at 2100  . levothyroxine (SYNTHROID) 75 MCG tablet Take 75 mcg by mouth daily.   10/16/2020 at 0700  . lisinopril (PRINIVIL,ZESTRIL) 20 MG tablet Take 20 mg by mouth daily.   10/16/2020 at 0800  . metoprolol succinate (TOPROL-XL) 50 MG 24 hr tablet Take 50 mg by mouth 2 (two) times daily. Take with or immediately following a meal.   10/16/2020 at 2000  . oxyCODONE-acetaminophen (PERCOCET/ROXICET) 5-325 MG tablet Take 1 tablet by mouth 2 (two) times daily as needed for severe pain.   Unknown at PRN  . paricalcitol (ZEMPLAR) 1 MCG capsule Take 1 mcg by mouth every Monday, Wednesday, and Friday.   10/13/2020 at Unknown  . ranolazine (RANEXA) 500 MG 12 hr tablet Take 500 mg by mouth 2 (two) times daily.   10/16/2020 at 2000  . rosuvastatin (CRESTOR) 10 MG tablet Take 10 mg by mouth daily.   10/16/2020 at Unknown   Scheduled: . cyanocobalamin  1,000 mcg Subcutaneous Daily  . insulin aspart  0-15 Units Subcutaneous TID WC  . insulin aspart  0-5 Units Subcutaneous QHS  . levothyroxine  75 mcg Oral Daily  . lidocaine  1 patch Transdermal Q24H  . metoprolol succinate  50 mg Oral BID  . ranolazine  500 mg Oral BID  . rosuvastatin  10 mg Oral Daily   Continuous: . [START ON 10/20/2020] iron sucrose    . pantoprozole (PROTONIX) infusion 8 mg/hr (10/19/20 1111)   IEP:PIRJJOACZYSAY **OR** acetaminophen, fluticasone, ondansetron **OR** ondansetron (ZOFRAN) IV, oxyCODONE-acetaminophen Anti-infectives (From admission, onward)   None     Scheduled Meds: . cyanocobalamin  1,000 mcg Subcutaneous Daily  . insulin aspart  0-15 Units Subcutaneous TID WC  .  insulin aspart  0-5 Units Subcutaneous QHS  . levothyroxine  75 mcg Oral Daily  . lidocaine  1 patch Transdermal Q24H  . metoprolol succinate  50 mg Oral BID  . ranolazine  500 mg Oral BID  . rosuvastatin  10 mg Oral Daily   Continuous Infusions: . [START ON 10/20/2020] iron sucrose    . pantoprozole (PROTONIX) infusion 8 mg/hr (10/19/20 1111)   PRN Meds:.acetaminophen **OR** acetaminophen, fluticasone, ondansetron **OR** ondansetron (ZOFRAN) IV, oxyCODONE-acetaminophen   Assessment: Principal Problem:   Acute upper GI bleeding Active Problems:   Type 2 diabetes mellitus (HCC)   Essential hypertension   HLD (hyperlipidemia)   Paroxysmal atrial fibrillation ([place])  GERD (gastroesophageal reflux disease)   Pacemaker   History of cancer of left breast   Sick sinus syndrome due to SA node dysfunction (HCC)   Plan: Courtnee Myer Frakes is a 87 y.o. female with metabolic syndrome, coronary disease, paroxysmal A. fib on aspirin and Plavix is admitted with melena and severe symptomatic anemia.  Melena and anemia Continue pantoprazole drip Monitor CBC closely and maintain hemoglobin above 8 N.p.o. effective midnight Last dose of Plavix on 3/7, plan to perform EGD after 3 days of discontinuation of Plavix which will be 3/11 Iron studies reveal iron deficiency, 1 dose of Venofer received yesterday Recommend another dose tomorrow B12 deficiency, recommend B12 injections daily for 3 days followed by B12 1000MCG daily   LOS: 1 [place]   Mallery Harshman 10/19/2020, 5:45 PM

## 2020-10-19 NOTE — Progress Notes (Signed)
PROGRESS NOTE    Regina [name]  KZL:[id number] DOB: 11-Jan-1934 DOA: 10/17/2020 PCP: Donnie Coffin, MD   Brief Narrative: Taken from H&P. Regina [name] is a 87 y.o. female with medical history significant for GERD, chronic low back pain, sick sinus syndrome status post pacemaker, hypothyroid, insulin-dependent diabetes mellitus, CAD, presented to the emergency department for chief concerns of weakness and dizziness.  She was having intermittent dark-colored stools for some time, unable to specify the duration.  Had couple of them back to back yesterday.  Patient was on aspirin and Plavix. Per patient she did had 2 loose black-colored bowel movements overnight but no documentation. Admitted for symptomatic anemia secondary to upper GI bleed with hemoglobin of 7.1, received 1 unit of PRBC with improvement in hemoglobin to 8.8, trending down to 8.3 this morning. GI was consulted-EGD is planned for Friday after 3-[place] washout of Plavix  Subjective: Patient had no new complaint today.  Had 1 episode of melena recorded yesterday evening. No nausea or vomiting.  Assessment & Plan:   Principal Problem:   Acute upper GI bleeding Active Problems:   Type 2 diabetes mellitus (HCC)   Essential hypertension   HLD (hyperlipidemia)   Paroxysmal atrial fibrillation (HCC)   GERD (gastroesophageal reflux disease)   Pacemaker   History of cancer of left breast   Sick sinus syndrome due to SA node dysfunction (HCC)  Symptomatic anemia secondary to upper GI bleed.  Hemoglobin improved to 8.8 after getting 1 unit of PRBC, hemoglobin at 8.3 today, seems stable.  Anemia panel consistent with anemia of chronic disease.  At 1 more episode of black-colored stool yesterday evening. Gastroenterology was consulted-EGD is planned for Friday morning after 3 days of Plavix washout. -Continue with Protonix infusion. -Monitor hemoglobin -Transfuse if below 8. -Keep holding aspirin and Plavix-CAD s/p PCI in 2009 as  mentioned in past medical history, no other documentation, could not find another reason to stay on DAPT. -PT/OT recommending SNF placement.  Hypertension.  Blood pressure . -Keep holding home dose of Bumex, lisinopril and metoprolol. -500 cc bolus  Paroxysmal atrial fibrillation.  Patient has an history of prior GI bleed per cardiology note but could not find any documentation.  Not a candidate for anticoagulation.  Currently rate well controlled. -Holding home dose of metoprolol for softer blood pressure-can resume it if heart rate trending up.  Sick sinus syndrome S/p pacemaker.  No acute concern. -She will continue outpatient follow-up for pacemaker interrogation with cardiology.  Hyperlipidemia. -Continue home dose of statin.  Insulin-dependent diabetes mellitus.  A1c of 5.4.  CBG mostly in 200s.  Patient was on 70/30 at home. -Continue with SSI  Hypothyroidism. -Continue home dose of Synthroid  Stage 3 obesity. Body mass index is 42.26 kg/m.  Objective: Vitals:   10/18/20 2044 10/19/20 0500 10/19/20 1004 10/19/20 1136  BP: (!) 92/45 (!) 94/5 (!) 91/24 (!) 82/42  Pulse: (!) 59 63 60 60  Resp: 17 18 18    Temp: 98.3 F (36.8 C) 98.4 F (36.9 C)  98.3 F (36.8 C)  TempSrc: Oral Oral  Oral  SpO2: 99% 98% 100% 100%  Weight:  94.9 kg    Height:        Intake/Output Summary (Last 24 hours) at 10/19/2020 1538 Last data filed at 10/19/2020 1330 Gross per 24 hour  Intake 1042.49 ml  Output 1600 ml  Net -557.51 ml   Filed Weights   10/17/20 2012 10/18/20 0601 10/19/20 0500  Weight: 95 kg 95  kg 94.9 kg    Examination:  General.  Obese elderly lady, in no acute distress. Pulmonary.  Lungs clear bilaterally, normal respiratory effort. CV.  Regular rate and rhythm, no JVD, rub or murmur. Abdomen.  Soft, nontender, nondistended, BS positive. CNS.  Alert and oriented x3.  No focal neurologic deficit. Extremities.  No edema, no cyanosis, pulses intact and  symmetrical. Psychiatry.  Judgment and insight appears normal.   DVT prophylaxis: SCDs Code Status: Full Family Communication: Discussed with daughter on phone. Disposition Plan:  Status is: Inpatient  Remains inpatient appropriate because:Inpatient level of care appropriate due to severity of illness   Dispo: The patient is from: Home              Anticipated d/c is to: Home              Patient currently is not medically stable to d/c.   Difficult to place patient No               Level of care: Progressive Cardiac  All the records are reviewed and case discussed with Care Management/Social Worker. Management plans discussed with the patient, nursing and they are in agreement.  Consultants:   GI  Procedures:  Antimicrobials:   Data Reviewed: I have personally reviewed following labs and imaging studies  CBC: Recent Labs  Lab 10/17/20 1242 10/17/20 1913 10/18/20 0629 10/18/20 1317 10/18/20 1855 10/19/20 0039 10/19/20 0944  WBC 9.0 8.0 6.9  --   --   --   --   NEUTROABS 6.6  --   --   --   --   --   --   HGB 7.1* 8.8* 8.3* 8.0* 8.5* 8.3* 8.2*  HCT 22.5* 26.9* 24.8* 24.1* 26.1* 27.2* 24.1*  MCV 97.4 92.1 90.8  --   --   --   --   PLT 262 227 238  --   --   --   --    Basic Metabolic Panel: Recent Labs  Lab 10/17/20 1242 10/18/20 0629 10/19/20 0900  NA 135 136 136  K 5.2* 3.9 4.4  CL 105 105 106  CO2 25 24 23   GLUCOSE 138* 129* 207*  BUN 47* 48* 48*  CREATININE 1.68* 1.58* 1.77*  CALCIUM 7.6* 7.9* 8.0*   GFR: Estimated Creatinine Clearance: 23.4 mL/min (A) (by C-G formula based on SCr of 1.77 mg/dL (H)). Liver Function Tests: Recent Labs  Lab 10/17/20 1242  AST 30  ALT 18  ALKPHOS 53  BILITOT 0.7  PROT 4.7*  ALBUMIN 2.4*   No results for input(s): LIPASE, AMYLASE in the last 168 hours. No results for input(s): AMMONIA in the last 168 hours. Coagulation Profile: No results for input(s): INR, PROTIME in the last 168 hours. Cardiac  Enzymes: No results for input(s): CKTOTAL, CKMB, CKMBINDEX, TROPONINI in the last 168 hours. BNP (last 3 results) No results for input(s): PROBNP in the last 8760 hours. HbA1C: Recent Labs    10/17/20 1913  HGBA1C 5.4   CBG: Recent Labs  Lab 10/18/20 1218 10/18/20 1643 10/18/20 2057 10/19/20 0941 10/19/20 1139  GLUCAP 240* 174* 172* 200* 267*   Lipid Profile: No results for input(s): CHOL, HDL, LDLCALC, TRIG, CHOLHDL, LDLDIRECT in the last 72 hours. Thyroid Function Tests: No results for input(s): TSH, T4TOTAL, FREET4, T3FREE, THYROIDAB in the last 72 hours. Anemia Panel: Recent Labs    10/17/20 1242 10/18/20 0629  VITAMINB12 174*  --   FOLATE  --  10.2  FERRITIN  --  35  TIBC  --  227*  IRON  --  44   Sepsis Labs: No results for input(s): PROCALCITON, LATICACIDVEN in the last 168 hours.  Recent Results (from the past 240 hour(s))  SARS CORONAVIRUS 2 (TAT 6-24 HRS) Nasopharyngeal Nasopharyngeal Swab     Status: None   Collection Time: 10/17/20  2:09 PM   Specimen: Nasopharyngeal Swab  Result Value Ref Range Status   SARS Coronavirus 2 NEGATIVE NEGATIVE Final    Comment: (NOTE) SARS-CoV-2 target nucleic acids are NOT DETECTED.  The SARS-CoV-2 RNA is generally detectable in upper and lower respiratory specimens during the acute phase of infection. Negative results do not preclude SARS-CoV-2 infection, do not rule out co-infections with other pathogens, and should not be used as the sole basis for treatment or other patient management decisions. Negative results must be combined with clinical observations, patient history, and epidemiological information. The expected result is Negative.  Fact Sheet for Patients: SugarRoll.be  Fact Sheet for Healthcare Providers: https://www.woods-mathews.com/  This test is not yet approved or cleared by the [place] FDA and  has been authorized for detection and/or diagnosis of  SARS-CoV-2 by FDA under an Emergency Use Authorization (EUA). This EUA will remain  in effect (meaning this test can be used) for the duration of the COVID-19 declaration under Se ction 564(b)(1) of the Act, 21 U.S.C. section 360bbb-3(b)(1), unless the authorization is terminated or revoked sooner.  Performed at [facility], [place] [address], [place], [place] [address]      Radiology Studies: No results found.  Scheduled Meds: . cyanocobalamin  1,000 mcg Subcutaneous Daily  . insulin aspart  0-15 Units Subcutaneous TID WC  . insulin aspart  0-5 Units Subcutaneous QHS  . levothyroxine  75 mcg Oral Daily  . lidocaine  1 patch Transdermal Q24H  . metoprolol succinate  50 mg Oral BID  . ranolazine  500 mg Oral BID  . rosuvastatin  10 mg Oral Daily   Continuous Infusions: . pantoprozole (PROTONIX) infusion 8 mg/hr (10/19/20 1111)     LOS: 1 [place]   Time spent: 30 minutes. More than 50% of the time was spent in counseling/coordination of care  Lorella Nimrod, MD [facility]  If 7PM-7AM, please contact night-coverage [email or web address]  10/19/2020, 3:38 PM   This record has been created using Systems analyst. Errors have been sought and corrected,but may not always be located. Such creation errors do not reflect on the standard of care.

## 2020-10-20 ENCOUNTER — Encounter
Admission: EM | Disposition: A | Discharge status: Skilled Nursing Facility | Payer: Self-pay | Source: Home / Self Care | Attending: Internal Medicine

## 2020-10-20 ENCOUNTER — Inpatient Hospital Stay: Payer: Medicare Other | Admitting: Certified Registered Nurse Anesthetist

## 2020-10-20 ENCOUNTER — Encounter: Payer: Self-pay | Admitting: Internal Medicine

## 2020-10-20 DIAGNOSIS — K2210 Ulcer of esophagus without bleeding: Secondary | ICD-10-CM | POA: Diagnosis not present

## 2020-10-20 DIAGNOSIS — D509 Iron deficiency anemia, unspecified: Secondary | ICD-10-CM

## 2020-10-20 DIAGNOSIS — D519 Vitamin B12 deficiency anemia, unspecified: Secondary | ICD-10-CM

## 2020-10-20 HISTORY — PX: ESOPHAGOGASTRODUODENOSCOPY (EGD) WITH PROPOFOL: SHX5813

## 2020-10-20 LAB — GLUCOSE, CAPILLARY
Glucose-Capillary: 255 mg/dL — ABNORMAL HIGH (ref 70–99)
Glucose-Capillary: 277 mg/dL — ABNORMAL HIGH (ref 70–99)
Glucose-Capillary: 231 mg/dL — ABNORMAL HIGH (ref 70–99)
Glucose-Capillary: 164 mg/dL — ABNORMAL HIGH (ref 70–99)
Glucose-Capillary: 95 mg/dL (ref 70–99)
Glucose-Capillary: 258 mg/dL — ABNORMAL HIGH (ref 70–99)

## 2020-10-20 LAB — TYPE AND SCREEN
ABO/RH(D): O POS
Antibody Screen: NEGATIVE
Unit division: 0
Unit division: 0

## 2020-10-20 LAB — HEMOGLOBIN AND HEMATOCRIT, BLOOD
HCT: 29.7 % — ABNORMAL LOW (ref 36.0–46.0)
Hemoglobin: 9.7 g/dL — ABNORMAL LOW (ref 12.0–15.0)

## 2020-10-20 LAB — BPAM RBC
Blood Product Expiration Date: 202204082359
Blood Product Expiration Date: 202204112359
ISSUE DATE / TIME: 202203081529
Unit Type and Rh: 5100
Unit Type and Rh: 5100

## 2020-10-20 LAB — CBC
HCT: 24.3 % — ABNORMAL LOW (ref 36.0–46.0)
Hemoglobin: 7.8 g/dL — ABNORMAL LOW (ref 12.0–15.0)
MCH: 30.4 pg (ref 26.0–34.0)
MCHC: 32.1 g/dL (ref 30.0–36.0)
MCV: 94.6 fL (ref 80.0–100.0)
Platelets: 216 10*3/uL (ref 150–400)
RBC: 2.57 MIL/uL — ABNORMAL LOW (ref 3.87–5.11)
RDW: 15.1 % (ref 11.5–15.5)
WBC: 6 10*3/uL (ref 4.0–10.5)
nRBC: 0 % (ref 0.0–0.2)

## 2020-10-20 LAB — PREPARE RBC (CROSSMATCH)

## 2020-10-20 SURGERY — ESOPHAGOGASTRODUODENOSCOPY (EGD) WITH PROPOFOL
Anesthesia: General

## 2020-10-20 MED ORDER — VITAMIN B-12 1000 MCG PO TABS
1000.0000 ug | ORAL_TABLET | Freq: Every day | ORAL | Status: DC
  Administered 2020-10-21 – 2020-10-28 (×8): 1000 ug via ORAL
  Filled 2020-10-20 (×9): qty 1

## 2020-10-20 MED ORDER — VASOPRESSIN 20 UNIT/ML IV SOLN
INTRAVENOUS | Status: AC
  Filled 2020-10-20: qty 1

## 2020-10-20 MED ORDER — EPHEDRINE SULFATE 50 MG/ML IJ SOLN
INTRAMUSCULAR | Status: DC | PRN
  Administered 2020-10-20 (×3): 5 mg via INTRAVENOUS

## 2020-10-20 MED ORDER — SODIUM CHLORIDE 0.9% IV SOLUTION: Freq: Once | INTRAVENOUS | Status: AC

## 2020-10-20 MED ORDER — PHENYLEPHRINE HCL (PRESSORS) 10 MG/ML IV SOLN
INTRAVENOUS | Status: DC | PRN
  Administered 2020-10-20 (×2): 100 ug via INTRAVENOUS

## 2020-10-20 MED ORDER — PROPOFOL 10 MG/ML IV BOLUS
INTRAVENOUS | Status: DC | PRN
  Administered 2020-10-20: 40 mg via INTRAVENOUS

## 2020-10-20 MED ORDER — PANTOPRAZOLE SODIUM 40 MG PO TBEC
40.0000 mg | DELAYED_RELEASE_TABLET | Freq: Two times a day (BID) | ORAL | Status: DC
  Administered 2020-10-20 – 2020-10-28 (×16): 40 mg via ORAL
  Filled 2020-10-20 (×17): qty 1

## 2020-10-20 MED ORDER — LIDOCAINE HCL (CARDIAC) PF 100 MG/5ML IV SOSY
PREFILLED_SYRINGE | INTRAVENOUS | Status: DC | PRN
  Administered 2020-10-20: 100 mg via INTRAVENOUS

## 2020-10-20 MED ORDER — SUCRALFATE 1 GM/10ML PO SUSP
1.0000 g | Freq: Three times a day (TID) | ORAL | Status: DC
  Administered 2020-10-20 – 2020-10-28 (×30): 1 g via ORAL
  Filled 2020-10-20 (×36): qty 10

## 2020-10-20 MED ORDER — GLYCOPYRROLATE 0.2 MG/ML IJ SOLN
INTRAMUSCULAR | Status: DC | PRN
  Administered 2020-10-20: .2 mg via INTRAVENOUS

## 2020-10-20 MED ORDER — INSULIN ASPART 100 UNIT/ML ~~LOC~~ SOLN
SUBCUTANEOUS | Status: AC
  Administered 2020-10-20: 4 [IU]
  Filled 2020-10-20: qty 1

## 2020-10-20 MED ORDER — INSULIN ASPART 100 UNIT/ML ~~LOC~~ SOLN: 4.0000 [IU] | Freq: Once | SUBCUTANEOUS | Status: DC

## 2020-10-20 MED ORDER — INSULIN GLARGINE 100 UNIT/ML ~~LOC~~ SOLN
10.0000 [IU] | Freq: Every day | SUBCUTANEOUS | Status: DC
  Administered 2020-10-20 – 2020-10-25 (×6): 10 [IU] via SUBCUTANEOUS
  Filled 2020-10-20 (×7): qty 0.1

## 2020-10-20 MED ORDER — PROPOFOL 500 MG/50ML IV EMUL
INTRAVENOUS | Status: AC
  Filled 2020-10-20: qty 50

## 2020-10-20 MED ORDER — SODIUM CHLORIDE 0.9 % IV SOLN: INTRAVENOUS | Status: DC

## 2020-10-20 MED ORDER — PROPOFOL 500 MG/50ML IV EMUL
INTRAVENOUS | Status: DC | PRN
  Administered 2020-10-20: 120 ug/kg/min via INTRAVENOUS

## 2020-10-20 MED ORDER — PROPOFOL 10 MG/ML IV BOLUS
INTRAVENOUS | Status: AC
  Filled 2020-10-20: qty 40

## 2020-10-20 MED ORDER — EPHEDRINE 5 MG/ML INJ
INTRAVENOUS | Status: AC
  Filled 2020-10-20: qty 10

## 2020-10-20 NOTE — Care Management Important Message (Signed)
Important Message  Patient Details  Name: TUWANNA KRAUSZ MRN: [id number] Date of Birth: 12-Jul-1934   Medicare Important Message Given:  Yes     Dannette Barbara 10/20/2020, 1:23 PM

## 2020-10-20 NOTE — Progress Notes (Signed)
PT Cancellation Note  Patient Details Name: Regina [name] MRN: [id number] DOB: 12-16-1933   Cancelled Treatment:    Reason Eval/Treat Not Completed: Patient at procedure or test/unavailable;Patient not medically ready (Pt off floor for GI study under general anesthesia. Pt will need new order or order continuance for our services to resume. Hold PT services at this time, will re-evaluate at later date/time once pt is medically cleared.)   Damonie Ellenwood C 10/20/2020, 9:08 AM

## 2020-10-20 NOTE — Progress Notes (Signed)
PROGRESS NOTE    Regina [name]  PNT:[id number] DOB: 12-17-1933 DOA: 10/17/2020 PCP: Donnie Coffin, MD   Brief Narrative: Taken from H&P. Regina [name] is a 87 y.o. female with medical history significant for GERD, chronic low back pain, sick sinus syndrome status post pacemaker, hypothyroid, insulin-dependent diabetes mellitus, CAD, presented to the emergency department for chief concerns of weakness and dizziness.  She was having intermittent dark-colored stools for some time, unable to specify the duration.  Had couple of them back to back yesterday.  Patient was on aspirin and Plavix. Per patient she did had 2 loose black-colored bowel movements overnight but no documentation. Admitted for symptomatic anemia secondary to upper GI bleed with hemoglobin of 7.1, received 1 unit of PRBC with improvement in hemoglobin to 8.8, trending down to 7.8 this morning.  Another unit ordered. GI was consulted-EGD on 10/20/2020  positive for a small esophageal ulcer with stigmata of bleeding.  Subjective: Patient was seen after EGD this morning.  Continues to have some melena.  Discussed the PT concern about going to SNF, patient wants to go home.  Daughter at bedside who wants her to go to SNF before returning home as she lives alone.  Assessment & Plan:   Principal Problem:   Acute upper GI bleeding Active Problems:   Type 2 diabetes mellitus (HCC)   Essential hypertension   HLD (hyperlipidemia)   Paroxysmal atrial fibrillation (HCC)   GERD (gastroesophageal reflux disease)   Pacemaker   History of cancer of left breast   Sick sinus syndrome due to SA node dysfunction (HCC)  Symptomatic anemia secondary to upper GI bleed.  Hemoglobin improved to 8.8 after getting 1 unit of PRBC, trending down again.  Anemia panel consistent with anemia of chronic disease.  Continue to have some melena. EGD with a bleeding small esophageal ulcer. -Give her 1 more unit of PRBC -Monitor hemoglobin -Transfuse if  below 8. -Keep holding aspirin and Plavix-CAD s/p PCI in 2009 as mentioned in past medical history, no other documentation, could not find another reason to stay on DAPT. -PT/OT recommending SNF placement.  Hypertension.  Blood pressure on softer side. -Keep holding home dose of Bumex, lisinopril and metoprolol.  Paroxysmal atrial fibrillation.  Patient has an history of prior GI bleed per cardiology note but could not find any documentation.  Not a candidate for anticoagulation.  Currently rate well controlled. -Holding home dose of metoprolol for softer blood pressure-can resume it if heart rate trending up.  Sick sinus syndrome S/p pacemaker.  No acute concern. -She will continue outpatient follow-up for pacemaker interrogation with cardiology.  Hyperlipidemia. -Continue home dose of statin.  Insulin-dependent diabetes mellitus.  A1c of 5.4.  CBG mostly in 200s.  Patient was on 70/30 at home. -Add Lantus 10 units -Continue with SSI  Hypothyroidism. -Continue home dose of Synthroid  Stage 3 obesity. Body mass index is 42.66 kg/m.  Objective: Vitals:   10/20/20 1011 10/20/20 1150 10/20/20 1422 10/20/20 1451  BP: (!) 127/35 (!) 85/55 (!) 95/31 (!) 103/30  Pulse: 60 61 60 (!) 59  Resp: 18 18 18 16   Temp: 97.8 F (36.6 C) 98.1 F (36.7 C) 97.7 F (36.5 C) (!) 97.5 F (36.4 C)  TempSrc: Oral  Oral Oral  SpO2: 100% 100% 100% 98%  Weight:      Height:        Intake/Output Summary (Last 24 hours) at 10/20/2020 1500 Last data filed at 10/20/2020 1100 Gross per 24 hour  Intake 1049.08 ml  Output 1600 ml  Net -550.92 ml   Filed Weights   10/18/20 0601 10/19/20 0500 10/20/20 0500  Weight: 95 kg 94.9 kg 95.8 kg    Examination:  General.  Morbidly obese elderly lady, in no acute distress. Pulmonary.  Lungs clear bilaterally, normal respiratory effort. CV.  Regular rate and rhythm, no JVD, rub or murmur. Abdomen.  Soft, nontender, nondistended, BS positive. CNS.  Alert  and oriented x3.  No focal neurologic deficit. Extremities.  No edema, no cyanosis, pulses intact and symmetrical. Psychiatry.  Judgment and insight appears normal.   DVT prophylaxis: SCDs Code Status: Full Family Communication: Discussed with daughter at bedside. Disposition Plan:  Status is: Inpatient  Remains inpatient appropriate because:Inpatient level of care appropriate due to severity of illness   Dispo: The patient is from: Home              Anticipated d/c is to: SNF              Patient currently is not medically stable to d/c.   Difficult to place patient No               Level of care: Med-Surg  All the records are reviewed and case discussed with Care Management/Social Worker. Management plans discussed with the patient, nursing and they are in agreement.  Consultants:   GI  Procedures:  Antimicrobials:   Data Reviewed: I have personally reviewed following labs and imaging studies  CBC: Recent Labs  Lab 10/17/20 1242 10/17/20 1913 10/18/20 0629 10/18/20 1317 10/18/20 1855 10/19/20 0039 10/19/20 0944 10/20/20 0536  WBC 9.0 8.0 6.9  --   --   --   --  6.0  NEUTROABS 6.6  --   --   --   --   --   --   --   HGB 7.1* 8.8* 8.3* 8.0* 8.5* 8.3* 8.2* 7.8*  HCT 22.5* 26.9* 24.8* 24.1* 26.1* 27.2* 24.1* 24.3*  MCV 97.4 92.1 90.8  --   --   --   --  94.6  PLT 262 227 238  --   --   --   --  998   Basic Metabolic Panel: Recent Labs  Lab 10/17/20 1242 10/18/20 0629 10/19/20 0900  NA 135 136 136  K 5.2* 3.9 4.4  CL 105 105 106  CO2 25 24 23   GLUCOSE 138* 129* 207*  BUN 47* 48* 48*  CREATININE 1.68* 1.58* 1.77*  CALCIUM 7.6* 7.9* 8.0*   GFR: Estimated Creatinine Clearance: 23.6 mL/min (A) (by C-G formula based on SCr of 1.77 mg/dL (H)). Liver Function Tests: Recent Labs  Lab 10/17/20 1242  AST 30  ALT 18  ALKPHOS 53  BILITOT 0.7  PROT 4.7*  ALBUMIN 2.4*   No results for input(s): LIPASE, AMYLASE in the last 168 hours. No results for  input(s): AMMONIA in the last 168 hours. Coagulation Profile: No results for input(s): INR, PROTIME in the last 168 hours. Cardiac Enzymes: No results for input(s): CKTOTAL, CKMB, CKMBINDEX, TROPONINI in the last 168 hours. BNP (last 3 results) No results for input(s): PROBNP in the last 8760 hours. HbA1C: Recent Labs    10/17/20 1913  HGBA1C 5.4   CBG: Recent Labs  Lab 10/19/20 2051 10/20/20 0743 10/20/20 0815 10/20/20 0855 10/20/20 1151  GLUCAP 276* 277* 255* 231* 164*   Lipid Profile: No results for input(s): CHOL, HDL, LDLCALC, TRIG, CHOLHDL, LDLDIRECT in the last 72 hours. Thyroid Function Tests: No  results for input(s): TSH, T4TOTAL, FREET4, T3FREE, THYROIDAB in the last 72 hours. Anemia Panel: Recent Labs    10/18/20 0629  FOLATE 10.2  FERRITIN 35  TIBC 227*  IRON 44   Sepsis Labs: No results for input(s): PROCALCITON, LATICACIDVEN in the last 168 hours.  Recent Results (from the past 240 hour(s))  SARS CORONAVIRUS 2 (TAT 6-24 HRS) Nasopharyngeal Nasopharyngeal Swab     Status: None   Collection Time: 10/17/20  2:09 PM   Specimen: Nasopharyngeal Swab  Result Value Ref Range Status   SARS Coronavirus 2 NEGATIVE NEGATIVE Final    Comment: (NOTE) SARS-CoV-2 target nucleic acids are NOT DETECTED.  The SARS-CoV-2 RNA is generally detectable in upper and lower respiratory specimens during the acute phase of infection. Negative results do not preclude SARS-CoV-2 infection, do not rule out co-infections with other pathogens, and should not be used as the sole basis for treatment or other patient management decisions. Negative results must be combined with clinical observations, patient history, and epidemiological information. The expected result is Negative.  Fact Sheet for Patients: SugarRoll.be  Fact Sheet for Healthcare Providers: https://www.woods-mathews.com/  This test is not yet approved or cleared by the  [place] FDA and  has been authorized for detection and/or diagnosis of SARS-CoV-2 by FDA under an Emergency Use Authorization (EUA). This EUA will remain  in effect (meaning this test can be used) for the duration of the COVID-19 declaration under Se ction 564(b)(1) of the Act, 21 U.S.C. section 360bbb-3(b)(1), unless the authorization is terminated or revoked sooner.  Performed at [facility], [place] [address], [place], [place] [address]      Radiology Studies: No results found.  Scheduled Meds: . insulin aspart  0-15 Units Subcutaneous TID WC  . insulin aspart  0-5 Units Subcutaneous QHS  . levothyroxine  75 mcg Oral Daily  . lidocaine  1 patch Transdermal Q24H  . metoprolol succinate  50 mg Oral BID  . pantoprazole  40 mg Oral BID AC  . ranolazine  500 mg Oral BID  . rosuvastatin  10 mg Oral Daily  . sucralfate  1 g Oral TID WC & HS  . [START ON 10/21/2020] vitamin B-12  1,000 mcg Oral Daily   Continuous Infusions:    LOS: 2 days   Time spent: 30 minutes. More than 50% of the time was spent in counseling/coordination of care  Lorella Nimrod, MD [facility]  If 7PM-7AM, please contact night-coverage [email or web address]  10/20/2020, 3:00 PM   This record has been created using Systems analyst. Errors have been sought and corrected,but may not always be located. Such creation errors do not reflect on the standard of care.

## 2020-10-20 NOTE — Plan of Care (Signed)
  Problem: Clinical Measurements: Goal: Ability to maintain clinical measurements within normal limits will improve Outcome: Progressing Goal: Diagnostic test results will improve Outcome: Progressing   Problem: Nutrition: Goal: Adequate nutrition will be maintained Outcome: Progressing   

## 2020-10-20 NOTE — Anesthesia Procedure Notes (Signed)
Date/Time: 10/20/2020 8:25 AM Performed by: Lily Peer, Olia Hinderliter, CRNA Pre-anesthesia Checklist: Emergency Drugs available, Patient identified, Suction available, Patient being monitored and Timeout performed Patient Re-evaluated:Patient Re-evaluated prior to induction Oxygen Delivery Method: Simple face mask Induction Type: IV induction

## 2020-10-20 NOTE — Progress Notes (Addendum)
PIV start: Protonix infusion in hand paused to draw CBC with PIV start in distal upper arm. Recommend re-draw by phlebotomy if abnormal. Dorothea Ogle, RN made aware.

## 2020-10-20 NOTE — Op Note (Signed)
[facility]astroenterology Patient Name: Regina [name] Procedure Date: 10/20/2020 7:42 AM MRN: [id number] Account #: [id number] Date of Birth: 27-Dec-1933 Admit Type: Outpatient Age: 87 Room: [facility] ENDO ROOM 4 Gender: Female Note Status: Finalized Procedure:             Upper GI endoscopy Indications:           Iron deficiency anemia secondary to chronic blood                         loss, Esophageal dysphagia Providers:             Lin Landsman MD, MD Medicines:             General Anesthesia Complications:         No immediate complications. Estimated blood loss: None. Procedure:             Pre-Anesthesia Assessment:                        - Prior to the procedure, a History and Physical was                         performed, and patient medications and allergies were                         reviewed. The patient is competent. The risks and                         benefits of the procedure and the sedation options and                         risks were discussed with the patient. All questions                         were answered and informed consent was obtained.                         Patient identification and proposed procedure were                         verified by the physician, the nurse, the                         anesthesiologist, the anesthetist and the technician                         in the pre-procedure area in the procedure room in the                         endoscopy suite. Mental Status Examination: alert and                         oriented. Airway Examination: normal oropharyngeal                         airway and neck mobility. Respiratory Examination:                         clear to auscultation. CV Examination: normal.  Prophylactic Antibiotics: The patient does not require                         prophylactic antibiotics. Prior Anticoagulants: The                         patient has taken Plavix  (clopidogrel), last dose was                         3 days prior to procedure. ASA Grade Assessment: III -                         A patient with severe systemic disease. After                         reviewing the risks and benefits, the patient was                         deemed in satisfactory condition to undergo the                         procedure. The anesthesia plan was to use general                         anesthesia. Immediately prior to administration of                         medications, the patient was re-assessed for adequacy                         to receive sedatives. The heart rate, respiratory                         rate, oxygen saturations, blood pressure, adequacy of                         pulmonary ventilation, and response to care were                         monitored throughout the procedure. The physical                         status of the patient was re-assessed after the                         procedure.                        After obtaining informed consent, the endoscope was                         passed under direct vision. Throughout the procedure,                         the patient's blood pressure, pulse, and oxygen                         saturations were monitored continuously. The Endoscope  was introduced through the mouth, and advanced to the                         second part of duodenum. The upper GI endoscopy was                         accomplished without difficulty. The patient tolerated                         the procedure well. Findings:      The duodenal bulb and second portion of the duodenum were normal.      The entire examined stomach was normal.      The cardia and gastric fundus were normal on retroflexion.      A medium-sized hiatal hernia was present.      One superficial esophageal ulcer with stigmata of recent bleeding was       found 33 cm from the incisors at the GE junction. The lesion was  10 mm       in largest dimension. Fulguration to ablate the lesion to prevent       bleeding by bipolar probe was successful. Estimated blood loss: none. Impression:            - Normal duodenal bulb and second portion of the                         duodenum.                        - Normal stomach.                        - Medium-sized hiatal hernia.                        - Esophageal ulcer with stigmata of recent bleeding.                         Treated with bipolar cautery.                        - No specimens collected. Recommendation:        - Return patient to hospital ward for ongoing care.                        - Mechanical soft diet for 3 months.                        - Repeat upper endoscopy in 3 months to check healing.                        - Follow an antireflux regimen for the rest of the                         patient's life.                        - Use Prilosec (omeprazole) 40 mg PO BID for 3 months.                        -  Return to GI clinic in 2 months. Procedure Code(s):     --- Professional ---                        [phone], Esophagogastroduodenoscopy, flexible,                         transoral; with control of bleeding, any method Diagnosis Code(s):     --- Professional ---                        K44.9, Diaphragmatic hernia without obstruction or                         gangrene                        D50.0, Iron deficiency anemia secondary to blood loss                         (chronic)                        K22.11, Ulcer of esophagus with bleeding                        R13.14, Dysphagia, pharyngoesophageal phase CPT copyright 2019 American Medical Association. All rights reserved. The codes documented in this report are preliminary and upon coder review may  be revised to meet current compliance requirements. Dr. Ulyess Mort Lin Landsman MD, MD 10/20/2020 8:50:58 AM This report has been signed electronically. Number of Addenda: 0 Note  Initiated On: 10/20/2020 7:42 AM Estimated Blood Loss:  Estimated blood loss: none.      [facility]

## 2020-10-20 NOTE — Transfer of Care (Signed)
Immediate Anesthesia Transfer of Care Note  Patient: Regina [name]  Procedure(s) Performed: ESOPHAGOGASTRODUODENOSCOPY (EGD) WITH PROPOFOL (N/A )  Patient Location: PACU  Anesthesia Type:General  Level of Consciousness: drowsy  Airway & Oxygen Therapy: Patient Spontanous Breathing  Post-op Assessment: Report given to RN and Post -op Vital signs reviewed and stable  Post vital signs: Reviewed and stable  Last Vitals:  Vitals Value Taken Time  BP 109/30 10/20/20 0852  Temp    Pulse 63 10/20/20 0851  Resp 13 10/20/20 0851  SpO2 100 % 10/20/20 0851  Vitals shown include unvalidated device data.  Last Pain:  Vitals:   10/20/20 0741  TempSrc: Oral  PainSc: 0-No pain         Complications: No complications documented.

## 2020-10-20 NOTE — Anesthesia Preprocedure Evaluation (Signed)
Anesthesia Evaluation  Patient identified by MRN, date of birth, ID band Patient awake    Reviewed: Allergy & Precautions, NPO status , Patient's Chart, lab work & pertinent test results, reviewed documented beta blocker date and time   Airway Mallampati: III  TM Distance: >3 FB     Dental  (+) Chipped   Pulmonary neg pulmonary ROS,           Cardiovascular hypertension, Pt. on medications and Pt. on home beta blockers + CAD and + Cardiac Stents  + dysrhythmias Atrial Fibrillation + pacemaker      Neuro/Psych negative neurological ROS  negative psych ROS   GI/Hepatic Neg liver ROS, GERD  Controlled,  Endo/Other  diabetes, Type 2Hypothyroidism Morbid obesity  Renal/GU negative Renal ROS  negative genitourinary   Musculoskeletal negative musculoskeletal ROS (+)   Abdominal   Peds negative pediatric ROS (+)  Hematology negative hematology ROS (+)   Anesthesia Other Findings Past Medical History: 2013: Breast cancer, left ([place])     Comment:  Mastectomy.  No date: CAD (coronary artery disease)     Comment:  s/p cath in 2009 with 2 stents No date: Diabetes mellitus without complication (HCC) No date: GERD (gastroesophageal reflux disease) No date: HLD (hyperlipidemia) No date: Hypertension No date: Hypothyroidism No date: Paroxysmal atrial fibrillation (HCC)  Reproductive/Obstetrics                             Anesthesia Physical  Anesthesia Plan  ASA: III  Anesthesia Plan: General   Post-op Pain Management:    Induction: Intravenous  PONV Risk Score and Plan: Propofol infusion  Airway Management Planned: Nasal Cannula  Additional Equipment:   Intra-op Plan:   Post-operative Plan:   Informed Consent: I have reviewed the patients History and Physical, chart, labs and discussed the procedure including the risks, benefits and alternatives for the proposed anesthesia with the  patient or authorized representative who has indicated his/her understanding and acceptance.       Plan Discussed with: CRNA  Anesthesia Plan Comments:         Anesthesia Quick Evaluation

## 2020-10-21 DIAGNOSIS — D509 Iron deficiency anemia, unspecified: Secondary | ICD-10-CM | POA: Diagnosis not present

## 2020-10-21 DIAGNOSIS — D519 Vitamin B12 deficiency anemia, unspecified: Secondary | ICD-10-CM | POA: Diagnosis not present

## 2020-10-21 LAB — TYPE AND SCREEN
ABO/RH(D): O POS
Antibody Screen: NEGATIVE
Unit division: 0

## 2020-10-21 LAB — CBC
HCT: 29.1 % — ABNORMAL LOW (ref 36.0–46.0)
Hemoglobin: 9.5 g/dL — ABNORMAL LOW (ref 12.0–15.0)
MCH: 30 pg (ref 26.0–34.0)
MCHC: 32.6 g/dL (ref 30.0–36.0)
MCV: 91.8 fL (ref 80.0–100.0)
Platelets: 183 10*3/uL (ref 150–400)
RBC: 3.17 MIL/uL — ABNORMAL LOW (ref 3.87–5.11)
RDW: 15.8 % — ABNORMAL HIGH (ref 11.5–15.5)
WBC: 6.9 10*3/uL (ref 4.0–10.5)
nRBC: 0 % (ref 0.0–0.2)

## 2020-10-21 LAB — BPAM RBC
Blood Product Expiration Date: 202204112359
ISSUE DATE / TIME: 202203111412
Unit Type and Rh: 5100

## 2020-10-21 LAB — GLUCOSE, CAPILLARY
Glucose-Capillary: 176 mg/dL — ABNORMAL HIGH (ref 70–99)
Glucose-Capillary: 168 mg/dL — ABNORMAL HIGH (ref 70–99)
Glucose-Capillary: 205 mg/dL — ABNORMAL HIGH (ref 70–99)
Glucose-Capillary: 224 mg/dL — ABNORMAL HIGH (ref 70–99)

## 2020-10-21 NOTE — Progress Notes (Signed)
PROGRESS NOTE    Regina [name]  EXH:[id number] DOB: 1933/10/03 DOA: 10/17/2020 PCP: Donnie Coffin, MD   Brief Narrative: Taken from H&P. Regina [name] is a 87 y.o. female with medical history significant for GERD, chronic low back pain, sick sinus syndrome status post pacemaker, hypothyroid, insulin-dependent diabetes mellitus, CAD, presented to the emergency department for chief concerns of weakness and dizziness.  She was having intermittent dark-colored stools for some time, unable to specify the duration.  Had couple of them back to back yesterday.  Patient was on aspirin and Plavix. Per patient she did had 2 loose black-colored bowel movements overnight but no documentation. Admitted for symptomatic anemia secondary to upper GI bleed with hemoglobin of 7.1, received 1 unit of PRBC with improvement in hemoglobin to 8.8, trending down to 7.8 this morning.  Hemoglobin at 9.5 after second unit.  GI was consulted-EGD on 10/20/2020  positive for a small esophageal ulcer with stigmata of bleeding.  Subjective: Patient was just feeling generalized weakness.  No other complaint.  She decided to go to SNF to get some rehab as living alone and does not think that she can manage herself. Denies any nausea, vomiting or any more melena.  Assessment & Plan:   Principal Problem:   Acute upper GI bleeding Active Problems:   Type 2 diabetes mellitus (HCC)   Essential hypertension   HLD (hyperlipidemia)   Paroxysmal atrial fibrillation (HCC)   GERD (gastroesophageal reflux disease)   Pacemaker   History of cancer of left breast   Sick sinus syndrome due to SA node dysfunction (HCC)  Symptomatic anemia secondary to upper GI bleed.  Hemoglobin improved to 8.8 after getting 1 unit of PRBC, trending down again.  Anemia panel consistent with anemia of chronic disease.  EGD with a bleeding small esophageal ulcer.  Melena resolved. Hemoglobin improved to 9.5 after second unit of PRBC -Monitor  hemoglobin -Transfuse if below 8. -Keep holding aspirin and Plavix-CAD s/p PCI in 2009 as mentioned in past medical history, no other documentation, could not find another reason to stay on DAPT. -PT/OT recommending SNF placement.  Hypertension.  Blood pressure within goal today -Keep holding home dose of Bumex, lisinopril and metoprolol. -Restart home dose of metoprolol -Can be restarted once blood pressure started going up.  Paroxysmal atrial fibrillation.  Patient has an history of prior GI bleed per cardiology note but could not find any documentation.  Not a candidate for anticoagulation.  Heart rate in low 100s today -Restart home dose of metoprolol.  Sick sinus syndrome S/p pacemaker.  No acute concern. -She will continue outpatient follow-up for pacemaker interrogation with cardiology.  Hyperlipidemia. -Continue home dose of statin.  Insulin-dependent diabetes mellitus.  A1c of 5.4.  CBG mostly in 200s.  Patient was on 70/30 at home. -Add Lantus 10 units -Continue with SSI  Hypothyroidism. -Continue home dose of Synthroid  Stage 3 obesity. Body mass index is 43.1 kg/m.  Objective: Vitals:   10/21/20 0545 10/21/20 0624 10/21/20 0723 10/21/20 1109  BP: (!) 139/50  (!) 118/42 (!) 149/120  Pulse: 99  (!) 102 (!) 110  Resp: 16  16 16   Temp: 98.9 F (37.2 C)  98 F (36.7 C) 98 F (36.7 C)  TempSrc: Oral     SpO2: 96%  96% 100%  Weight:  96.8 kg    Height:        Intake/Output Summary (Last 24 hours) at 10/21/2020 1559 Last data filed at 10/21/2020 1330 Gross per  24 hour  Intake 913.65 ml  Output 400 ml  Net 513.65 ml   Filed Weights   10/19/20 0500 10/20/20 0500 10/21/20 0624  Weight: 94.9 kg 95.8 kg 96.8 kg    Examination:  General.  Obese elderly lady, in no acute distress. Pulmonary.  Lungs clear bilaterally, normal respiratory effort. CV.  Sinus tachycardia, no JVD, rub or murmur. Abdomen.  Soft, nontender, nondistended, BS positive. CNS.  Alert and  oriented x3.  No focal neurologic deficit. Extremities.  No edema, no cyanosis, pulses intact and symmetrical. Psychiatry.  Judgment and insight appears normal.  DVT prophylaxis: SCDs Code Status: Full Family Communication: Discussed with patient. Disposition Plan:  Status is: Inpatient  Remains inpatient appropriate because:Inpatient level of care appropriate due to severity of illness   Dispo: The patient is from: Home              Anticipated d/c is to: SNF              Patient currently is not medically stable to d/c.   Difficult to place patient No               Level of care: Med-Surg  All the records are reviewed and case discussed with Care Management/Social Worker. Management plans discussed with the patient, nursing and they are in agreement.  Consultants:   GI  Procedures:  Antimicrobials:   Data Reviewed: I have personally reviewed following labs and imaging studies  CBC: Recent Labs  Lab 10/17/20 1242 10/17/20 1913 10/18/20 0629 10/18/20 1317 10/19/20 0039 10/19/20 0944 10/20/20 0536 10/20/20 2105 10/21/20 0459  WBC 9.0 8.0 6.9  --   --   --  6.0  --  6.9  NEUTROABS 6.6  --   --   --   --   --   --   --   --   HGB 7.1* 8.8* 8.3*   < > 8.3* 8.2* 7.8* 9.7* 9.5*  HCT 22.5* 26.9* 24.8*   < > 27.2* 24.1* 24.3* 29.7* 29.1*  MCV 97.4 92.1 90.8  --   --   --  94.6  --  91.8  PLT 262 227 238  --   --   --  216  --  183   < > = values in this interval not displayed.   Basic Metabolic Panel: Recent Labs  Lab 10/17/20 1242 10/18/20 0629 10/19/20 0900  NA 135 136 136  K 5.2* 3.9 4.4  CL 105 105 106  CO2 25 24 23   GLUCOSE 138* 129* 207*  BUN 47* 48* 48*  CREATININE 1.68* 1.58* 1.77*  CALCIUM 7.6* 7.9* 8.0*   GFR: Estimated Creatinine Clearance: 23.7 mL/min (A) (by C-G formula based on SCr of 1.77 mg/dL (H)). Liver Function Tests: Recent Labs  Lab 10/17/20 1242  AST 30  ALT 18  ALKPHOS 53  BILITOT 0.7  PROT 4.7*  ALBUMIN 2.4*   No results  for input(s): LIPASE, AMYLASE in the last 168 hours. No results for input(s): AMMONIA in the last 168 hours. Coagulation Profile: No results for input(s): INR, PROTIME in the last 168 hours. Cardiac Enzymes: No results for input(s): CKTOTAL, CKMB, CKMBINDEX, TROPONINI in the last 168 hours. BNP (last 3 results) No results for input(s): PROBNP in the last 8760 hours. HbA1C: No results for input(s): HGBA1C in the last 72 hours. CBG: Recent Labs  Lab 10/20/20 1151 10/20/20 1607 10/20/20 2106 10/21/20 0723 10/21/20 1112  GLUCAP 164* 95 258* 176* 168*  Lipid Profile: No results for input(s): CHOL, HDL, LDLCALC, TRIG, CHOLHDL, LDLDIRECT in the last 72 hours. Thyroid Function Tests: No results for input(s): TSH, T4TOTAL, FREET4, T3FREE, THYROIDAB in the last 72 hours. Anemia Panel: No results for input(s): VITAMINB12, FOLATE, FERRITIN, TIBC, IRON, RETICCTPCT in the last 72 hours. Sepsis Labs: No results for input(s): PROCALCITON, LATICACIDVEN in the last 168 hours.  Recent Results (from the past 240 hour(s))  SARS CORONAVIRUS 2 (TAT 6-24 HRS) Nasopharyngeal Nasopharyngeal Swab     Status: None   Collection Time: 10/17/20  2:09 PM   Specimen: Nasopharyngeal Swab  Result Value Ref Range Status   SARS Coronavirus 2 NEGATIVE NEGATIVE Final    Comment: (NOTE) SARS-CoV-2 target nucleic acids are NOT DETECTED.  The SARS-CoV-2 RNA is generally detectable in upper and lower respiratory specimens during the acute phase of infection. Negative results do not preclude SARS-CoV-2 infection, do not rule out co-infections with other pathogens, and should not be used as the sole basis for treatment or other patient management decisions. Negative results must be combined with clinical observations, patient history, and epidemiological information. The expected result is Negative.  Fact Sheet for Patients: SugarRoll.be  Fact Sheet for Healthcare  Providers: https://www.woods-mathews.com/  This test is not yet approved or cleared by the [place] FDA and  has been authorized for detection and/or diagnosis of SARS-CoV-2 by FDA under an Emergency Use Authorization (EUA). This EUA will remain  in effect (meaning this test can be used) for the duration of the COVID-19 declaration under Se ction 564(b)(1) of the Act, 21 U.S.C. section 360bbb-3(b)(1), unless the authorization is terminated or revoked sooner.  Performed at [facility], [place] [address], [place], [place] [address]      Radiology Studies: No results found.  Scheduled Meds: . insulin aspart  0-15 Units Subcutaneous TID WC  . insulin aspart  0-5 Units Subcutaneous QHS  . insulin glargine  10 Units Subcutaneous QHS  . levothyroxine  75 mcg Oral Daily  . lidocaine  1 patch Transdermal Q24H  . metoprolol succinate  50 mg Oral BID  . pantoprazole  40 mg Oral BID AC  . ranolazine  500 mg Oral BID  . rosuvastatin  10 mg Oral Daily  . sucralfate  1 g Oral TID WC & HS  . vitamin B-12  1,000 mcg Oral Daily   Continuous Infusions:    LOS: 3 days   Time spent: 28 minutes. More than 50% of the time was spent in counseling/coordination of care  Lorella Nimrod, MD [facility]  If 7PM-7AM, please contact night-coverage [email or web address]  10/21/2020, 3:59 PM   This record has been created using Systems analyst. Errors have been sought and corrected,but may not always be located. Such creation errors do not reflect on the standard of care.

## 2020-10-22 DIAGNOSIS — D519 Vitamin B12 deficiency anemia, unspecified: Secondary | ICD-10-CM | POA: Diagnosis not present

## 2020-10-22 DIAGNOSIS — D509 Iron deficiency anemia, unspecified: Secondary | ICD-10-CM | POA: Diagnosis not present

## 2020-10-22 LAB — GLUCOSE, CAPILLARY
Glucose-Capillary: 135 mg/dL — ABNORMAL HIGH (ref 70–99)
Glucose-Capillary: 141 mg/dL — ABNORMAL HIGH (ref 70–99)
Glucose-Capillary: 146 mg/dL — ABNORMAL HIGH (ref 70–99)
Glucose-Capillary: 265 mg/dL — ABNORMAL HIGH (ref 70–99)

## 2020-10-22 LAB — CBC
HCT: 31.1 % — ABNORMAL LOW (ref 36.0–46.0)
Hemoglobin: 10.5 g/dL — ABNORMAL LOW (ref 12.0–15.0)
MCH: 30.8 pg (ref 26.0–34.0)
MCHC: 33.8 g/dL (ref 30.0–36.0)
MCV: 91.2 fL (ref 80.0–100.0)
Platelets: 215 10*3/uL (ref 150–400)
RBC: 3.41 MIL/uL — ABNORMAL LOW (ref 3.87–5.11)
RDW: 15.6 % — ABNORMAL HIGH (ref 11.5–15.5)
WBC: 7.5 10*3/uL (ref 4.0–10.5)
nRBC: 0 % (ref 0.0–0.2)

## 2020-10-22 NOTE — Progress Notes (Signed)
Gave report pt to janet 1 (A)

## 2020-10-22 NOTE — TOC Progression Note (Signed)
Transition of Care [facility]) - Progression Note    Patient Details  Name: Regina [name] MRN: [id number] Date of Birth: Jul 22, 1934  [facility] CM/SW Contact  Izola Price, RN Phone Number: 10/22/2020, 9:51 AM  Clinical Narrative: 10/22/20 PASRR#  03/28/09 #[id number], Abelardo Diesel RN CM         Expected Discharge Plan and Services                                                 Social Determinants of Health (SDOH) Interventions    Readmission Risk Interventions No flowsheet data found.

## 2020-10-22 NOTE — Progress Notes (Signed)
PROGRESS NOTE    Regina [name]  RWE:[id number] DOB: May 14, 1934 DOA: 10/17/2020 PCP: Donnie Coffin, MD   Brief Narrative: Taken from H&P. Regina [name] is a 87 y.o. female with medical history significant for GERD, chronic low back pain, sick sinus syndrome status post pacemaker, hypothyroid, insulin-dependent diabetes mellitus, CAD, presented to the emergency department for chief concerns of weakness and dizziness.  She was having intermittent dark-colored stools for some time, unable to specify the duration.  Had couple of them back to back yesterday.  Patient was on aspirin and Plavix. Per patient she did had 2 loose black-colored bowel movements overnight but no documentation. Admitted for symptomatic anemia secondary to upper GI bleed with hemoglobin of 7.1, received 1 unit of PRBC with improvement in hemoglobin to 8.8, trending down to 7.8 this morning.  Hemoglobin at 9.5 after second unit.  GI was consulted-EGD on 10/20/2020  positive for a small esophageal ulcer with stigmata of bleeding.  PT recommended SNF placement-waiting for bed availability and insurance authorization, she wants to go to [facility].  Subjective: Patient was little upset with the move to MedSurg, stating that this room was very cold. No other complaints.  No more melena.  Assessment & Plan:   Principal Problem:   Acute upper GI bleeding Active Problems:   Type 2 diabetes mellitus (HCC)   Essential hypertension   HLD (hyperlipidemia)   Paroxysmal atrial fibrillation (HCC)   GERD (gastroesophageal reflux disease)   Pacemaker   History of cancer of left breast   Sick sinus syndrome due to SA node dysfunction (HCC)  Symptomatic anemia secondary to upper GI bleed.  Hemoglobin continued to improve, currently at 10.5.  Hemoglobin improved to 8.8 after getting 1 unit of PRBC, trending down again.  Anemia panel consistent with anemia of chronic disease.  EGD with a bleeding small esophageal ulcer.  Melena  resolved. Hemoglobin improved to 9.5 after second unit of PRBC -Monitor hemoglobin -Transfuse if below 8. -Keep holding aspirin and Plavix-CAD s/p PCI in 2009 as mentioned in past medical history, no other documentation, could not find another reason to stay on DAPT. -PT/OT recommending SNF placement.  Hypertension.  Blood pressure within goal today -Keep holding home dose of Bumex and lisinopril  -Continue home dose of metoprolol -Can be restarted once blood pressure started going up.  Paroxysmal atrial fibrillation.  Patient has an history of prior GI bleed per cardiology note but could not find any documentation.  Not a candidate for anticoagulation.  Heart rate improved after restarting home dose of metoprolol. -Continue home dose of metoprolol.  Sick sinus syndrome S/p pacemaker.  No acute concern. -She will continue outpatient follow-up for pacemaker interrogation with cardiology.  Hyperlipidemia. -Continue home dose of statin.  Insulin-dependent diabetes mellitus.  A1c of 5.4.  CBG within goal now after adding Lantus.  Patient was on 70/30 at home. -Continue Lantus 10 units -Continue with SSI  Hypothyroidism. -Continue home dose of Synthroid  Stage 3 obesity. Body mass index is 45.91 kg/m.  Objective: Vitals:   10/22/20 0320 10/22/20 0555 10/22/20 0558 10/22/20 0724  BP:   (!) 110/42 (!) 118/42  Pulse: 92  86 71  Resp:   20 18  Temp:   98.6 F (37 C) 97.8 F (36.6 C)  TempSrc:   Oral Oral  SpO2: 97%  99% 98%  Weight:  103.1 kg    Height:        Intake/Output Summary (Last 24 hours) at 10/22/2020 [phone]  Last data filed at 10/22/2020 0446 Gross per 24 hour  Intake 957 ml  Output 895 ml  Net 62 ml   Filed Weights   10/20/20 0500 10/21/20 0624 10/22/20 0555  Weight: 95.8 kg 96.8 kg 103.1 kg    Examination:  General.  Morbidly obese elderly lady, in no acute distress. Pulmonary.  Lungs clear bilaterally, normal respiratory effort. CV.  Regular rate and  rhythm, no JVD, rub or murmur. Abdomen.  Soft, nontender, nondistended, BS positive. CNS.  Alert and oriented x3.  No focal neurologic deficit. Extremities.  No edema, no cyanosis, pulses intact and symmetrical. Psychiatry.  Judgment and insight appears normal.  DVT prophylaxis: SCDs Code Status: Full Family Communication: Discussed with patient. Disposition Plan:  Status is: Inpatient  Remains inpatient appropriate because:Inpatient level of care appropriate due to severity of illness   Dispo: The patient is from: Home              Anticipated d/c is to: SNF              Patient currently is not medically stable to d/c.   Difficult to place patient No               Level of care: Med-Surg  All the records are reviewed and case discussed with Care Management/Social Worker. Management plans discussed with the patient, nursing and they are in agreement.  Consultants:   GI  Procedures:  Antimicrobials:   Data Reviewed: I have personally reviewed following labs and imaging studies  CBC: Recent Labs  Lab 10/17/20 1242 10/17/20 1913 10/18/20 0629 10/18/20 1317 10/19/20 0944 10/20/20 0536 10/20/20 2105 10/21/20 0459 10/22/20 0611  WBC 9.0 8.0 6.9  --   --  6.0  --  6.9 7.5  NEUTROABS 6.6  --   --   --   --   --   --   --   --   HGB 7.1* 8.8* 8.3*   < > 8.2* 7.8* 9.7* 9.5* 10.5*  HCT 22.5* 26.9* 24.8*   < > 24.1* 24.3* 29.7* 29.1* 31.1*  MCV 97.4 92.1 90.8  --   --  94.6  --  91.8 91.2  PLT 262 227 238  --   --  216  --  183 215   < > = values in this interval not displayed.   Basic Metabolic Panel: Recent Labs  Lab 10/17/20 1242 10/18/20 0629 10/19/20 0900  NA 135 136 136  K 5.2* 3.9 4.4  CL 105 105 106  CO2 25 24 23   GLUCOSE 138* 129* 207*  BUN 47* 48* 48*  CREATININE 1.68* 1.58* 1.77*  CALCIUM 7.6* 7.9* 8.0*   GFR: Estimated Creatinine Clearance: 24.7 mL/min (A) (by C-G formula based on SCr of 1.77 mg/dL (H)). Liver Function Tests: Recent Labs  Lab  10/17/20 1242  AST 30  ALT 18  ALKPHOS 53  BILITOT 0.7  PROT 4.7*  ALBUMIN 2.4*   No results for input(s): LIPASE, AMYLASE in the last 168 hours. No results for input(s): AMMONIA in the last 168 hours. Coagulation Profile: No results for input(s): INR, PROTIME in the last 168 hours. Cardiac Enzymes: No results for input(s): CKTOTAL, CKMB, CKMBINDEX, TROPONINI in the last 168 hours. BNP (last 3 results) No results for input(s): PROBNP in the last 8760 hours. HbA1C: No results for input(s): HGBA1C in the last 72 hours. CBG: Recent Labs  Lab 10/21/20 0723 10/21/20 1112 10/21/20 1618 10/21/20 2109 10/22/20 0725  GLUCAP 176*  168* 205* 224* 135*   Lipid Profile: No results for input(s): CHOL, HDL, LDLCALC, TRIG, CHOLHDL, LDLDIRECT in the last 72 hours. Thyroid Function Tests: No results for input(s): TSH, T4TOTAL, FREET4, T3FREE, THYROIDAB in the last 72 hours. Anemia Panel: No results for input(s): VITAMINB12, FOLATE, FERRITIN, TIBC, IRON, RETICCTPCT in the last 72 hours. Sepsis Labs: No results for input(s): PROCALCITON, LATICACIDVEN in the last 168 hours.  Recent Results (from the past 240 hour(s))  SARS CORONAVIRUS 2 (TAT 6-24 HRS) Nasopharyngeal Nasopharyngeal Swab     Status: None   Collection Time: 10/17/20  2:09 PM   Specimen: Nasopharyngeal Swab  Result Value Ref Range Status   SARS Coronavirus 2 NEGATIVE NEGATIVE Final    Comment: (NOTE) SARS-CoV-2 target nucleic acids are NOT DETECTED.  The SARS-CoV-2 RNA is generally detectable in upper and lower respiratory specimens during the acute phase of infection. Negative results do not preclude SARS-CoV-2 infection, do not rule out co-infections with other pathogens, and should not be used as the sole basis for treatment or other patient management decisions. Negative results must be combined with clinical observations, patient history, and epidemiological information. The expected result is Negative.  Fact Sheet  for Patients: SugarRoll.be  Fact Sheet for Healthcare Providers: https://www.woods-mathews.com/  This test is not yet approved or cleared by the [place] FDA and  has been authorized for detection and/or diagnosis of SARS-CoV-2 by FDA under an Emergency Use Authorization (EUA). This EUA will remain  in effect (meaning this test can be used) for the duration of the COVID-19 declaration under Se ction 564(b)(1) of the Act, 21 U.S.C. section 360bbb-3(b)(1), unless the authorization is terminated or revoked sooner.  Performed at [facility], [place] [address], [place], [place] [address]      Radiology Studies: No results found.  Scheduled Meds: . insulin aspart  0-15 Units Subcutaneous TID WC  . insulin aspart  0-5 Units Subcutaneous QHS  . insulin glargine  10 Units Subcutaneous QHS  . levothyroxine  75 mcg Oral Daily  . lidocaine  1 patch Transdermal Q24H  . metoprolol succinate  50 mg Oral BID  . pantoprazole  40 mg Oral BID AC  . ranolazine  500 mg Oral BID  . rosuvastatin  10 mg Oral Daily  . sucralfate  1 g Oral TID WC & HS  . vitamin B-12  1,000 mcg Oral Daily   Continuous Infusions:    LOS: 4 days   Time spent: 26 minutes. More than 50% of the time was spent in counseling/coordination of care  Lorella Nimrod, MD [facility]  If 7PM-7AM, please contact night-coverage [email or web address]  10/22/2020, 8:42 AM   This record has been created using Systems analyst. Errors have been sought and corrected,but may not always be located. Such creation errors do not reflect on the standard of care.

## 2020-10-22 NOTE — Plan of Care (Signed)
  Problem: Clinical Measurements: Goal: Ability to maintain clinical measurements within normal limits will improve Outcome: Progressing   Problem: Clinical Measurements: Goal: Respiratory complications will improve Outcome: Progressing   Problem: Safety: Goal: Ability to remain free from injury will improve Outcome: Progressing

## 2020-10-22 NOTE — Plan of Care (Signed)

## 2020-10-23 ENCOUNTER — Inpatient Hospital Stay: Payer: Medicare Other

## 2020-10-23 ENCOUNTER — Telehealth: Payer: Self-pay

## 2020-10-23 DIAGNOSIS — D509 Iron deficiency anemia, unspecified: Secondary | ICD-10-CM | POA: Diagnosis not present

## 2020-10-23 DIAGNOSIS — D519 Vitamin B12 deficiency anemia, unspecified: Secondary | ICD-10-CM | POA: Diagnosis not present

## 2020-10-23 LAB — GLUCOSE, CAPILLARY
Glucose-Capillary: 82 mg/dL (ref 70–99)
Glucose-Capillary: 89 mg/dL (ref 70–99)
Glucose-Capillary: 88 mg/dL (ref 70–99)
Glucose-Capillary: 200 mg/dL — ABNORMAL HIGH (ref 70–99)
Glucose-Capillary: 214 mg/dL — ABNORMAL HIGH (ref 70–99)

## 2020-10-23 NOTE — Progress Notes (Signed)
Occupational Therapy Treatment Patient Details Name: Regina [name] MRN: [id number] DOB: 1934/04/04 Today's Date: 10/23/2020    History of present illness Regina [name] is an 68yoF who comes to [facility] c symptomatic anemia, found to have GIB, now s/p PRBC infusion, albeit pressures remain low 90s/50s. PMH: GERD, low back pain, SSS s/p PPM, hypothyroidism, IDDM, CAD. At baseline pt lives alone, limited household distance AMB c fixed objects or RW, needs support daily for ADL performance.   OT comments  Regina [name] was seen for OT treatment on this date. Upon arrival to room pt asleep, waking to voice. Pt disgruntled that she was not aroused for her breakfast tray because it is now cold and that last time she was in the chair she "got left there too long." Pt agreeable to OOB to chair with plan for PT to see after lunch to assist back to bed (PT and NT notified). Pt requires MOD A exit L side of bed. MAX A don/doff B socks seated EOB. MIN A + RW sit<>stand - pt anxious that she is falling 2/2 difficulty grasping RW handles, returned to sitting. MOD A for SPT bed>chair - pt screams that she is falling when her hands are not tightly holding OT/chair rest despite no evidence of knee buckling, B knee block, and no LOBs.    Family member in room halfway thorough session. Pt and family memeber are agreeable to STR. Pt assisted to order lunch and educated on call bell system. Pt reports R knee pain (not quantified), RN notified. Pt making progress toward goals. Pt continues to benefit from skilled OT services to maximize return to PLOF and minimize risk of future falls, injury, caregiver burden, and readmission. Will continue to follow POC. Discharge recommendation remains appropriate.    Follow Up Recommendations  SNF    Equipment Recommendations  Other (comment) (TBD)    Recommendations for Other Services      Precautions / Restrictions Precautions Precautions: Fall Restrictions Weight Bearing Restrictions:  No       Mobility Bed Mobility Overal bed mobility: Needs Assistance Bed Mobility: Supine to Sit     Supine to sit: Mod assist          Transfers Overall transfer level: Needs assistance Equipment used: 1 person hand held assist Transfers: Sit to/from Stand;Stand Pivot Transfers Sit to Stand: Mod assist Stand pivot transfers: Mod assist            Balance Overall balance assessment: Needs assistance Sitting-balance support: No upper extremity supported;Feet supported Sitting balance-Leahy Scale: Fair     Standing balance support: Bilateral upper extremity supported Standing balance-Leahy Scale: Poor                             ADL either performed or assessed with clinical judgement   ADL Overall ADL's : Needs assistance/impaired                                       General ADL Comments: MAX A for LBD seated EOB. MOD A for simulated BSC t/f. SETUP self-drinking reclined in chair.               Cognition Arousal/Alertness: Awake/alert Behavior During Therapy: WFL for tasks assessed/performed;Anxious Overall Cognitive Status: Within Functional Limits for tasks assessed  Exercises Exercises: Other exercises Other Exercises Other Exercises: Pt and family educated re: OT role, DME recs, d/c recs, falls prevetion, importance of mobility for funcitonal strengthening, adapted dining equipment Other Exercises: LBD, self-feeding, sup>sit, sit<>stand, SPT, sitting/standing balance/tolerance           Pertinent Vitals/ Pain       Pain Assessment: Faces Faces Pain Scale: Hurts even more Pain Location: R knee Pain Descriptors / Indicators: Dull;Discomfort;Crying Pain Intervention(s): Limited activity within patient's tolerance;Repositioned         Frequency  Min 1X/week        Progress Toward Goals  OT Goals(current goals can now be found in the care plan  section)  Progress towards OT goals: Progressing toward goals  Acute Rehab OT Goals Patient Stated Goal: regain strength, return to home OT Goal Formulation: With patient Time For Goal Achievement: 11/02/20 Potential to Achieve Goals: Good ADL Goals Pt Will Perform Grooming: with set-up;with supervision;standing Pt Will Perform Lower Body Dressing: with mod assist;sitting/lateral leans Pt Will Transfer to Toilet: with modified independence;ambulating;regular height toilet  Plan Discharge plan remains appropriate;Frequency remains appropriate       AM-PAC OT "6 Clicks" Daily Activity     Outcome Measure   Help from another person eating meals?: A Little Help from another person taking care of personal grooming?: A Little Help from another person toileting, which includes using toliet, bedpan, or urinal?: A Lot Help from another person bathing (including washing, rinsing, drying)?: A Lot Help from another person to put on and taking off regular upper body clothing?: A Little Help from another person to put on and taking off regular lower body clothing?: A Lot 6 Click Score: 15    End of Session    OT Visit Diagnosis: Other abnormalities of gait and mobility (R26.89);Muscle weakness (generalized) (M62.81)   Activity Tolerance Patient tolerated treatment well   Patient Left in chair;with call bell/phone within reach;with chair alarm set;with family/visitor present   Nurse Communication Mobility status        Time: 3151-7616 OT Time Calculation (min): 44 min  Charges: OT General Charges $OT Visit: 1 Visit OT Treatments $Self Care/Home Management : 38-52 mins  Dessie Coma, M.S. OTR/L  10/23/20, 1:59 PM  ascom [phone]

## 2020-10-23 NOTE — Plan of Care (Signed)

## 2020-10-23 NOTE — Progress Notes (Signed)
Physical Therapy Treatment Patient Details Name: Regina [name] MRN: [id number] DOB: [birth year] Today's Date: 10/23/2020    History of Present Illness Regina [name] is an 5yoF who comes to [facility] c symptomatic anemia, found to have GIB, now s/p PRBC infusion, albeit pressures remain low 90s/50s. Pt underwent Upper GI endoscophy on 10/20/20. PMH: GERD, low back pain, SSS s/p PPM, hypothyroidism, IDDM, CAD, L breast CA. At baseline pt lives alone, limited household distance AMB c fixed objects or RW, needs support daily for ADL performance.    PT Comments    Continue at transfer orders received following procedure where pt underwent general anesthesia. Current goals remain appropriate at this time.   Pt seen for PT session with daughter present providing encouragement throughout session. Pt c/o significant R knee pain with daughter voicing concerns since pt has experienced pain since falling a few days ago - notified MD of c/o & concerns. Pt attempts sit<>stand with mod assist with cuing for hand placement. Once in standing pt unable to weight shift nor advance BLE with pt repeatedly yelling "I'm falling" but actually not moving until pt elected to sit back down in recliner without ensuring she was close enough to it. Pt then set up for stand pivot transfer to R, recliner>bed with pt requiring max assist with pt's daughter electing to assist pt. Pt doesn't tolerate PT blocking R knee 2/2 pain. Sitting EOB Pt requires MAX cuing for scooting back onto bed, and max assist sit>supine. Pt attempts to scoot to [facility] with cuing for technique but poor return demo, requiring total assist +2. Encouraged pt perform BLE exercises while in bed. Pt would benefit from STR upon d/c to maximize independence with functional mobility & reduce fall risk prior to return home.    Follow Up Recommendations  Supervision/Assistance - 24 hour;SNF;Supervision for mobility/OOB     Equipment Recommendations  None recommended by PT     Recommendations for Other Services       Precautions / Restrictions Precautions Precautions: Fall Restrictions Weight Bearing Restrictions: No    Mobility  Bed Mobility Overal bed mobility: Needs Assistance Bed Mobility: Sit to Supine     Supine to sit: Mod assist Sit to supine: Max assist        Transfers Overall transfer level: Needs assistance Equipment used: 2 person hand held assist Transfers: Sit to/from [facility] Sit to Stand: Mod assist;Max assist Stand pivot transfers: Max assist;+2 physical assistance;+2 safety/equipment       General transfer comment: cuing for hand placement to push to standing for sit>stand with youth RW  Ambulation/Gait                 Stairs             Wheelchair Mobility    Modified Rankin (Stroke Patients Only)       Balance Overall balance assessment: Needs assistance Sitting-balance support: Feet supported;Bilateral upper extremity supported Sitting balance-Leahy Scale: Fair     Standing balance support: Bilateral upper extremity supported Standing balance-Leahy Scale: Poor                              Cognition Arousal/Alertness: Awake/alert Behavior During Therapy: Anxious Overall Cognitive Status: Within Functional Limits for tasks assessed                                 General  Comments: Self limiting, pain limiting, requires MAX cuing/encouragement for redirection      Exercises     General Comments        Pertinent Vitals/Pain Pain Assessment: Faces Faces Pain Scale: Hurts even more Pain Location: R knee Pain Descriptors / Indicators: Crying;Discomfort Pain Intervention(s): Monitored during session;Repositioned    Home Living                      Prior Function            PT Goals (current goals can now be found in the care plan section) Acute Rehab PT Goals Patient Stated Goal: regain strength, return to home PT Goal  Formulation: With patient Time For Goal Achievement: 11/02/20 Potential to Achieve Goals: Fair Progress towards PT goals: Progressing toward goals    Frequency    Min 2X/week      PT Plan Current plan remains appropriate    Co-evaluation              AM-PAC PT "6 Clicks" Mobility   Outcome Measure  Help needed turning from your back to your side while in a flat bed without using bedrails?: A Lot Help needed moving from lying on your back to sitting on the side of a flat bed without using bedrails?: A Lot Help needed moving to and from a bed to a chair (including a wheelchair)?: Total Help needed standing up from a chair using your arms (e.g., wheelchair or bedside chair)?: A Lot Help needed to walk in hospital room?: Total Help needed climbing 3-5 steps with a railing? : Total 6 Click Score: 9    End of Session Equipment Utilized During Treatment: Gait belt Activity Tolerance:  (limited 2/2 pain & anxiety) Patient left: in bed;with call bell/phone within reach;with bed alarm set;with family/visitor present   PT Visit Diagnosis: Difficulty in walking, not elsewhere classified (R26.2);Muscle weakness (generalized) (M62.81)     Time: 9485-4627 PT Time Calculation (min) (ACUTE ONLY): 24 min  Charges:  $Therapeutic Activity: 23-37 mins                     Lavone Nian, PT, DPT 10/23/20, 3:39 PM    Waunita Schooner 10/23/2020, 3:33 PM

## 2020-10-23 NOTE — Progress Notes (Signed)
PROGRESS NOTE    Regina [name]  JKK:[id number] DOB: 04/15/1934 DOA: 10/17/2020 PCP: Donnie Coffin, MD   Brief Narrative: Taken from H&P. Regina [name] is a 87 y.o. female with medical history significant for GERD, chronic low back pain, sick sinus syndrome status post pacemaker, hypothyroid, insulin-dependent diabetes mellitus, CAD, presented to the emergency department for chief concerns of weakness and dizziness.  She was having intermittent dark-colored stools for some time, unable to specify the duration.  Had couple of them back to back yesterday.  Patient was on aspirin and Plavix. Per patient she did had 2 loose black-colored bowel movements overnight but no documentation. Admitted for symptomatic anemia secondary to upper GI bleed with hemoglobin of 7.1, received 1 unit of PRBC with improvement in hemoglobin to 8.8, trending down to 7.8 this morning.  Hemoglobin at 9.5 after second unit.  GI was consulted-EGD on 10/20/2020  positive for a small esophageal ulcer with stigmata of bleeding.  PT recommended SNF placement-waiting for bed availability and insurance authorization, she wants to go to [facility].  Subjective: Patient was resting comfortably when seen today.  Denies any more melena.  Assessment & Plan:   Principal Problem:   Acute upper GI bleeding Active Problems:   Type 2 diabetes mellitus (HCC)   Essential hypertension   HLD (hyperlipidemia)   Paroxysmal atrial fibrillation (HCC)   GERD (gastroesophageal reflux disease)   Pacemaker   History of cancer of left breast   Sick sinus syndrome due to SA node dysfunction (HCC)  Symptomatic anemia secondary to upper GI bleed.  Hemoglobin continued to improve, currently at 10.5.  Received 2 units of PRBCs.  Anemia panel consistent with anemia of chronic disease.  EGD with a bleeding small esophageal ulcer.  Melena resolved. -Monitor hemoglobin -Transfuse if below 8. -Keep holding aspirin and Plavix-CAD s/p PCI in 2009  as mentioned in past medical history, no other documentation, could not find another reason to stay on DAPT. -PT/OT recommending SNF placement.  Hypertension.  Blood pressure within goal today -Keep holding home dose of Bumex and lisinopril  -Continue home dose of metoprolol -Can be restarted once blood pressure started going up.  Paroxysmal atrial fibrillation.  Patient has an history of prior GI bleed per cardiology note but could not find any documentation.  Not a candidate for anticoagulation.  Heart rate improved after restarting home dose of metoprolol. -Continue home dose of metoprolol.  Sick sinus syndrome S/p pacemaker.  No acute concern. -She will continue outpatient follow-up for pacemaker interrogation with cardiology.  Hyperlipidemia. -Continue home dose of statin.  Insulin-dependent diabetes mellitus.  A1c of 5.4.  CBG within goal now after adding Lantus.  Patient was on 70/30 at home. -Continue Lantus 10 units -Continue with SSI  Hypothyroidism. -Continue home dose of Synthroid  Stage 3 obesity. Body mass index is 45.91 kg/m.  Objective: Vitals:   10/22/20 2354 10/23/20 0323 10/23/20 0806 10/23/20 1157  BP: (!) 111/58 116/63 109/70 (!) 108/46  Pulse: 69 71 62 62  Resp: 19 16 17 16   Temp: (!) 97.5 F (36.4 C) 98.3 F (36.8 C) 98.6 F (37 C) 97.9 F (36.6 C)  TempSrc: Oral Oral    SpO2: 100% 98% 98% 99%  Weight:      Height:        Intake/Output Summary (Last 24 hours) at 10/23/2020 1405 Last data filed at 10/23/2020 1356 Gross per 24 hour  Intake 360 ml  Output 400 ml  Net -40 ml  Filed Weights   10/20/20 0500 10/21/20 0624 10/22/20 0555  Weight: 95.8 kg 96.8 kg 103.1 kg    Examination:  General.  Obese elderly lady, in no acute distress. Pulmonary.  Lungs clear bilaterally, normal respiratory effort. CV.  Regular rate and rhythm, no JVD, rub or murmur. Abdomen.  Soft, nontender, nondistended, BS positive. CNS.  Alert and oriented x3.  No  focal neurologic deficit. Extremities.  No edema, no cyanosis, pulses intact and symmetrical. Psychiatry.  Judgment and insight appears normal.  DVT prophylaxis: SCDs Code Status: Full Family Communication: Discussed with patient. Disposition Plan:  Status is: Inpatient  Remains inpatient appropriate because:Inpatient level of care appropriate due to severity of illness   Dispo: The patient is from: Home              Anticipated d/c is to: SNF              Patient currently is not medically stable to d/c.   Difficult to place patient No               Level of care: Med-Surg  All the records are reviewed and case discussed with Care Management/Social Worker. Management plans discussed with the patient, nursing and they are in agreement.  Consultants:   GI  Procedures:  Antimicrobials:   Data Reviewed: I have personally reviewed following labs and imaging studies  CBC: Recent Labs  Lab 10/17/20 1242 10/17/20 1913 10/18/20 0629 10/18/20 1317 10/19/20 0944 10/20/20 0536 10/20/20 2105 10/21/20 0459 10/22/20 0611  WBC 9.0 8.0 6.9  --   --  6.0  --  6.9 7.5  NEUTROABS 6.6  --   --   --   --   --   --   --   --   HGB 7.1* 8.8* 8.3*   < > 8.2* 7.8* 9.7* 9.5* 10.5*  HCT 22.5* 26.9* 24.8*   < > 24.1* 24.3* 29.7* 29.1* 31.1*  MCV 97.4 92.1 90.8  --   --  94.6  --  91.8 91.2  PLT 262 227 238  --   --  216  --  183 215   < > = values in this interval not displayed.   Basic Metabolic Panel: Recent Labs  Lab 10/17/20 1242 10/18/20 0629 10/19/20 0900  NA 135 136 136  K 5.2* 3.9 4.4  CL 105 105 106  CO2 25 24 23   GLUCOSE 138* 129* 207*  BUN 47* 48* 48*  CREATININE 1.68* 1.58* 1.77*  CALCIUM 7.6* 7.9* 8.0*   GFR: Estimated Creatinine Clearance: 24.7 mL/min (A) (by C-G formula based on SCr of 1.77 mg/dL (H)). Liver Function Tests: Recent Labs  Lab 10/17/20 1242  AST 30  ALT 18  ALKPHOS 53  BILITOT 0.7  PROT 4.7*  ALBUMIN 2.4*   No results for input(s):  LIPASE, AMYLASE in the last 168 hours. No results for input(s): AMMONIA in the last 168 hours. Coagulation Profile: No results for input(s): INR, PROTIME in the last 168 hours. Cardiac Enzymes: No results for input(s): CKTOTAL, CKMB, CKMBINDEX, TROPONINI in the last 168 hours. BNP (last 3 results) No results for input(s): PROBNP in the last 8760 hours. HbA1C: No results for input(s): HGBA1C in the last 72 hours. CBG: Recent Labs  Lab 10/22/20 1633 10/22/20 2107 10/23/20 0808 10/23/20 0832 10/23/20 1200  GLUCAP 146* 265* 82 89 88   Lipid Profile: No results for input(s): CHOL, HDL, LDLCALC, TRIG, CHOLHDL, LDLDIRECT in the last 72 hours. Thyroid Function  Tests: No results for input(s): TSH, T4TOTAL, FREET4, T3FREE, THYROIDAB in the last 72 hours. Anemia Panel: No results for input(s): VITAMINB12, FOLATE, FERRITIN, TIBC, IRON, RETICCTPCT in the last 72 hours. Sepsis Labs: No results for input(s): PROCALCITON, LATICACIDVEN in the last 168 hours.  Recent Results (from the past 240 hour(s))  SARS CORONAVIRUS 2 (TAT 6-24 HRS) Nasopharyngeal Nasopharyngeal Swab     Status: None   Collection Time: 10/17/20  2:09 PM   Specimen: Nasopharyngeal Swab  Result Value Ref Range Status   SARS Coronavirus 2 NEGATIVE NEGATIVE Final    Comment: (NOTE) SARS-CoV-2 target nucleic acids are NOT DETECTED.  The SARS-CoV-2 RNA is generally detectable in upper and lower respiratory specimens during the acute phase of infection. Negative results do not preclude SARS-CoV-2 infection, do not rule out co-infections with other pathogens, and should not be used as the sole basis for treatment or other patient management decisions. Negative results must be combined with clinical observations, patient history, and epidemiological information. The expected result is Negative.  Fact Sheet for Patients: SugarRoll.be  Fact Sheet for Healthcare  Providers: https://www.woods-mathews.com/  This test is not yet approved or cleared by the [place] FDA and  has been authorized for detection and/or diagnosis of SARS-CoV-2 by FDA under an Emergency Use Authorization (EUA). This EUA will remain  in effect (meaning this test can be used) for the duration of the COVID-19 declaration under Se ction 564(b)(1) of the Act, 21 U.S.C. section 360bbb-3(b)(1), unless the authorization is terminated or revoked sooner.  Performed at [facility], [place] [address], [place], [place] [address]      Radiology Studies: No results found.  Scheduled Meds: . insulin aspart  0-15 Units Subcutaneous TID WC  . insulin aspart  0-5 Units Subcutaneous QHS  . insulin glargine  10 Units Subcutaneous QHS  . levothyroxine  75 mcg Oral Daily  . lidocaine  1 patch Transdermal Q24H  . metoprolol succinate  50 mg Oral BID  . pantoprazole  40 mg Oral BID AC  . ranolazine  500 mg Oral BID  . rosuvastatin  10 mg Oral Daily  . sucralfate  1 g Oral TID WC & HS  . vitamin B-12  1,000 mcg Oral Daily   Continuous Infusions:    LOS: 5 days   Time spent: 25 minutes. More than 50% of the time was spent in counseling/coordination of care  Lorella Nimrod, MD [facility]  If 7PM-7AM, please contact night-coverage [email or web address]  10/23/2020, 2:05 PM   This record has been created using Systems analyst. Errors have been sought and corrected,but may not always be located. Such creation errors do not reflect on the standard of care.

## 2020-10-23 NOTE — Telephone Encounter (Signed)
Patient is still in hospital. Called patient and left a detail message informing patient she was scheduled 12/04/2020

## 2020-10-23 NOTE — Telephone Encounter (Signed)
-----   Message from Lin Landsman, MD sent at 10/20/2020  1:52 PM EST ----- Regarding: Hospital follow-up, esophageal ulcer Caryl Pina  Please schedule a follow-up to see me in 1-2 months, okay to overbook Diagnosis, anemia and esophageal ulcer  RV

## 2020-10-24 DIAGNOSIS — D509 Iron deficiency anemia, unspecified: Secondary | ICD-10-CM | POA: Diagnosis not present

## 2020-10-24 DIAGNOSIS — D519 Vitamin B12 deficiency anemia, unspecified: Secondary | ICD-10-CM | POA: Diagnosis not present

## 2020-10-24 LAB — CBC
HCT: 30.4 % — ABNORMAL LOW (ref 36.0–46.0)
Hemoglobin: 9.9 g/dL — ABNORMAL LOW (ref 12.0–15.0)
MCH: 30 pg (ref 26.0–34.0)
MCHC: 32.6 g/dL (ref 30.0–36.0)
MCV: 92.1 fL (ref 80.0–100.0)
Platelets: 211 K/uL (ref 150–400)
RBC: 3.3 MIL/uL — ABNORMAL LOW (ref 3.87–5.11)
RDW: 15 % (ref 11.5–15.5)
WBC: 8.4 K/uL (ref 4.0–10.5)
nRBC: 0 % (ref 0.0–0.2)

## 2020-10-24 LAB — GLUCOSE, CAPILLARY
Glucose-Capillary: 191 mg/dL — ABNORMAL HIGH (ref 70–99)
Glucose-Capillary: 201 mg/dL — ABNORMAL HIGH (ref 70–99)
Glucose-Capillary: 136 mg/dL — ABNORMAL HIGH (ref 70–99)
Glucose-Capillary: 116 mg/dL — ABNORMAL HIGH (ref 70–99)
Glucose-Capillary: 195 mg/dL — ABNORMAL HIGH (ref 70–99)

## 2020-10-24 MED ORDER — POLYETHYLENE GLYCOL 3350 17 G PO PACK
17.0000 g | PACK | Freq: Every day | ORAL | Status: DC
  Administered 2020-10-25: 17 g via ORAL
  Filled 2020-10-24 (×2): qty 1

## 2020-10-24 MED ORDER — BISACODYL 10 MG RE SUPP
10.0000 mg | Freq: Every day | RECTAL | Status: DC | PRN
  Administered 2020-10-24: 10 mg via RECTAL
  Filled 2020-10-24: qty 1

## 2020-10-24 NOTE — NC FL2 (Signed)
[place] LEVEL OF CARE SCREENING TOOL     IDENTIFICATION  Patient Name: Regina [name] Birthdate: 12-01-1933 Sex: female Admission Date (Current Location): 10/17/2020  [place] and [place] Number:  Engineering geologist and Address:  [facility], [address] [place], [place] [address]      Provider Number: [id number]  Attending Physician Name and Address:  Lorella Nimrod, MD  Relative Name and Phone Number:       Current Level of Care: Hospital Recommended Level of Care: [place] Prior Approval Number:    Date Approved/Denied:   PASRR Number: [id number]  Discharge Plan: SNF    Current Diagnoses: Patient Active Problem List   Diagnosis Date Noted  . Acute upper GI bleeding 10/17/2020  . Pacemaker 12/16/2019  . History of cancer of left breast 12/16/2019  . Sick sinus syndrome due to SA node dysfunction ([place]) 12/16/2019  . Pressure injury of skin 09/26/2016  . C. difficile colitis 09/25/2016  . Sepsis ([place]) 08/28/2016  . Cellulitis 06/26/2016  . UTI (lower urinary tract infection) 02/20/2015  . Elevated troponin 02/20/2015  . Hypoglycemia 02/20/2015  . Type 2 diabetes mellitus ([place]) 02/20/2015  . Essential hypertension 02/20/2015  . HLD (hyperlipidemia) 02/20/2015  . Paroxysmal atrial fibrillation ([place]) 02/20/2015  . GERD (gastroesophageal reflux disease) 02/20/2015    Orientation RESPIRATION BLADDER Height & Weight     Self,Time,Situation,Place  Normal Incontinent,External catheter Weight: 227 lb 4.7 oz (103.1 kg) Height:  4\' 11"  (149.9 cm)  BEHAVIORAL SYMPTOMS/MOOD NEUROLOGICAL BOWEL NUTRITION STATUS   (None)  (None) Incontinent Diet (DYS 1)  AMBULATORY STATUS COMMUNICATION OF NEEDS Skin   Extensive Assist Verbally Skin abrasions,Bruising                       Personal Care Assistance Level of Assistance  Bathing,Feeding,Dressing Bathing Assistance: Maximum assistance Feeding assistance:  Limited assistance Dressing Assistance: Maximum assistance     Functional Limitations Info  Sight,Hearing,Speech Sight Info: Adequate Hearing Info: Adequate Speech Info: Adequate    SPECIAL CARE FACTORS FREQUENCY  PT (By licensed PT),OT (By licensed OT)     PT Frequency: 5 x week OT Frequency: 5 x week            Contractures Contractures Info: Not present    Additional Factors Info  Code Status,Allergies Code Status Info: Full code Allergies Info: NKDA           Current Medications (10/24/2020):  This is the current hospital active medication list Current Facility-Administered Medications  Medication Dose Route Frequency Provider Last Rate Last Admin  . bisacodyl (DULCOLAX) suppository 10 mg  10 mg Rectal Daily PRN Lorella Nimrod, MD   10 mg at 10/24/20 1206  . fluticasone (FLONASE) 50 MCG/ACT nasal spray 2 spray  2 spray Each Nare Daily PRN Cox, Amy N, DO      . insulin aspart (novoLOG) injection 0-15 Units  0-15 Units Subcutaneous TID WC Cox, Amy N, DO   5 Units at 10/24/20 1205  . insulin aspart (novoLOG) injection 0-5 Units  0-5 Units Subcutaneous QHS Cox, Amy N, DO   2 Units at 10/23/20 2204  . insulin glargine (LANTUS) injection 10 Units  10 Units Subcutaneous QHS Lorella Nimrod, MD   10 Units at 10/23/20 2203  . levothyroxine (SYNTHROID) tablet 75 mcg  75 mcg Oral Daily Cox, Amy N, DO   75 mcg at 10/24/20 0701  . lidocaine (LIDODERM) 5 % 1 patch  1 patch Transdermal Q24H Lorella Nimrod, MD   1 patch at 10/24/20 1204  . metoprolol succinate (TOPROL-XL) 24 hr tablet 50 mg  50 mg Oral BID Cox, Amy N, DO   50 mg at 10/23/20 0902  . ondansetron (ZOFRAN) tablet 4 mg  4 mg Oral Q6H PRN Cox, Amy N, DO       Or  . ondansetron (ZOFRAN) injection 4 mg  4 mg Intravenous Q6H PRN Cox, Amy N, DO      . oxyCODONE-acetaminophen (PERCOCET/ROXICET) 5-325 MG per tablet 1 tablet  1 tablet Oral BID PRN Cox, Amy N, DO   1 tablet at 10/21/20 1150  . pantoprazole (PROTONIX) EC tablet 40 mg   40 mg Oral BID AC Lin Landsman, MD   40 mg at 10/24/20 0853  . polyethylene glycol (MIRALAX / GLYCOLAX) packet 17 g  17 g Oral Daily Lorella Nimrod, MD      . ranolazine (RANEXA) 12 hr tablet 500 mg  500 mg Oral BID Cox, Amy N, DO   500 mg at 10/24/20 0853  . rosuvastatin (CRESTOR) tablet 10 mg  10 mg Oral Daily Cox, Amy N, DO   10 mg at 10/24/20 0853  . sucralfate (CARAFATE) 1 GM/10ML suspension 1 g  1 g Oral TID WC & HS Marius Ditch, Tally Due, MD   1 g at 10/24/20 1204  . vitamin B-12 (CYANOCOBALAMIN) tablet 1,000 mcg  1,000 mcg Oral Daily Lin Landsman, MD   1,000 mcg at 10/24/20 4782     Discharge Medications: Please see discharge summary for a list of discharge medications.  Relevant Imaging Results:  Relevant Lab Results:   Additional Information SS#: [id number]. Not vaccinated.  Candie Chroman, LCSW

## 2020-10-24 NOTE — Plan of Care (Signed)

## 2020-10-24 NOTE — Progress Notes (Signed)
PROGRESS NOTE    Regina [name]  PIR:[id number] DOB: 1933-10-02 DOA: 10/17/2020 PCP: Donnie Coffin, MD   Brief Narrative: Taken from H&P. Regina [name] is a 87 y.o. female with medical history significant for GERD, chronic low back pain, sick sinus syndrome status post pacemaker, hypothyroid, insulin-dependent diabetes mellitus, CAD, presented to the emergency department for chief concerns of weakness and dizziness.  She was having intermittent dark-colored stools for some time, unable to specify the duration.  Had couple of them back to back yesterday.  Patient was on aspirin and Plavix. Per patient she did had 2 loose black-colored bowel movements overnight but no documentation. Admitted for symptomatic anemia secondary to upper GI bleed with hemoglobin of 7.1, received 1 unit of PRBC with improvement in hemoglobin to 8.8, trending down to 7.8 this morning.  Hemoglobin at 9.5 after second unit.  GI was consulted-EGD on 10/20/2020  positive for a small esophageal ulcer with stigmata of bleeding.  PT recommended SNF placement-waiting for bed availability and insurance authorization, she wants to go to [facility].  Subjective: Patient was complaining of constipation and asking for a suppository.  No nausea or vomiting.  Denies any pain during morning rounds.  Assessment & Plan:   Principal Problem:   Acute upper GI bleeding Active Problems:   Type 2 diabetes mellitus (HCC)   Essential hypertension   HLD (hyperlipidemia)   Paroxysmal atrial fibrillation (HCC)   GERD (gastroesophageal reflux disease)   Pacemaker   History of cancer of left breast   Sick sinus syndrome due to SA node dysfunction (HCC)  Symptomatic anemia secondary to upper GI bleed.  Hemoglobin continued to improve, currently at 9.9.  Received 2 units of PRBCs.  Anemia panel consistent with anemia of chronic disease.  EGD with a bleeding small esophageal ulcer.  Melena resolved. -Monitor hemoglobin -Transfuse if  below 8. -Keep holding aspirin and Plavix-CAD s/p PCI in 2009 as mentioned in past medical history, no other documentation, could not find another reason to stay on DAPT. -PT/OT recommending SNF placement.  Hypertension.  Blood pressure within goal today -Keep holding home dose of Bumex and lisinopril  -Continue home dose of metoprolol -Can be restarted once blood pressure started going up, we will restart Bumex if lower extremity edema continue to get worse.  Paroxysmal atrial fibrillation.  Patient has an history of prior GI bleed per cardiology note but could not find any documentation.  Not a candidate for anticoagulation.  Heart rate improved after restarting home dose of metoprolol. -Continue home dose of metoprolol.  Sick sinus syndrome S/p pacemaker.  No acute concern. -She will continue outpatient follow-up for pacemaker interrogation with cardiology.  Hyperlipidemia. -Continue home dose of statin.  Insulin-dependent diabetes mellitus.  A1c of 5.4.  CBG within goal now after adding Lantus.  Patient was on 70/30 at home. -Continue Lantus 10 units -Continue with SSI  Hypothyroidism. -Continue home dose of Synthroid  Stage 3 obesity. Body mass index is 45.91 kg/m.  Objective: Vitals:   10/23/20 2340 10/24/20 0427 10/24/20 0812 10/24/20 1133  BP: (!) 103/48 (!) 104/49 (!) 102/39 (!) 99/46  Pulse: 67 70 63 95  Resp: 16 16 16 17   Temp: 99.4 F (37.4 C) 99.1 F (37.3 C) 98.3 F (36.8 C) 98.2 F (36.8 C)  TempSrc:   Oral   SpO2: 100% 98% 99% 100%  Weight:      Height:        Intake/Output Summary (Last 24 hours) at 10/24/2020  [place] filed at 10/23/2020 2032 Gross per 24 hour  Intake 0 ml  Output 350 ml  Net -350 ml   Filed Weights   10/20/20 0500 10/21/20 0624 10/22/20 0555  Weight: 95.8 kg 96.8 kg 103.1 kg    Examination:  General.  Morbidly obese elderly lady, in no acute distress. Pulmonary.  Lungs clear bilaterally, normal respiratory  effort. CV.  Regular rate and rhythm, no JVD, rub or murmur. Abdomen.  Soft, nontender, nondistended, BS positive. CNS.  Alert and oriented x3.  No focal neurologic deficit. Extremities.  Trace LE edema, no cyanosis, pulses intact and symmetrical. Psychiatry.  Judgment and insight appears normal.  DVT prophylaxis: SCDs Code Status: Full Family Communication: Discussed with patient. Disposition Plan:  Status is: Inpatient  Remains inpatient appropriate because:Inpatient level of care appropriate due to severity of illness   Dispo: The patient is from: Home              Anticipated d/c is to: SNF              Patient currently is medically stable.   Difficult to place patient No               Level of care: Med-Surg  All the records are reviewed and case discussed with Care Management/Social Worker. Management plans discussed with the patient, nursing and they are in agreement.  Consultants:   GI  Procedures:  Antimicrobials:   Data Reviewed: I have personally reviewed following labs and imaging studies  CBC: Recent Labs  Lab 10/18/20 0629 10/18/20 1317 10/20/20 0536 10/20/20 2105 10/21/20 0459 10/22/20 0611 10/24/20 0650  WBC 6.9  --  6.0  --  6.9 7.5 8.4  HGB 8.3*   < > 7.8* 9.7* 9.5* 10.5* 9.9*  HCT 24.8*   < > 24.3* 29.7* 29.1* 31.1* 30.4*  MCV 90.8  --  94.6  --  91.8 91.2 92.1  PLT 238  --  216  --  183 215 211   < > = values in this interval not displayed.   Basic Metabolic Panel: Recent Labs  Lab 10/18/20 0629 10/19/20 0900  NA 136 136  K 3.9 4.4  CL 105 106  CO2 24 23  GLUCOSE 129* 207*  BUN 48* 48*  CREATININE 1.58* 1.77*  CALCIUM 7.9* 8.0*   GFR: Estimated Creatinine Clearance: 24.7 mL/min (A) (by C-G formula based on SCr of 1.77 mg/dL (H)). Liver Function Tests: No results for input(s): AST, ALT, ALKPHOS, BILITOT, PROT, ALBUMIN in the last 168 hours. No results for input(s): LIPASE, AMYLASE in the last 168 hours. No results for input(s):  AMMONIA in the last 168 hours. Coagulation Profile: No results for input(s): INR, PROTIME in the last 168 hours. Cardiac Enzymes: No results for input(s): CKTOTAL, CKMB, CKMBINDEX, TROPONINI in the last 168 hours. BNP (last 3 results) No results for input(s): PROBNP in the last 8760 hours. HbA1C: No results for input(s): HGBA1C in the last 72 hours. CBG: Recent Labs  Lab 10/23/20 1200 10/23/20 1659 10/23/20 2028 10/24/20 0813 10/24/20 1148  GLUCAP 88 200* 214* 191* 201*   Lipid Profile: No results for input(s): CHOL, HDL, LDLCALC, TRIG, CHOLHDL, LDLDIRECT in the last 72 hours. Thyroid Function Tests: No results for input(s): TSH, T4TOTAL, FREET4, T3FREE, THYROIDAB in the last 72 hours. Anemia Panel: No results for input(s): VITAMINB12, FOLATE, FERRITIN, TIBC, IRON, RETICCTPCT in the last 72 hours. Sepsis Labs: No results for input(s): PROCALCITON, LATICACIDVEN in the last 168  hours.  Recent Results (from the past 240 hour(s))  SARS CORONAVIRUS 2 (TAT 6-24 HRS) Nasopharyngeal Nasopharyngeal Swab     Status: None   Collection Time: 10/17/20  2:09 PM   Specimen: Nasopharyngeal Swab  Result Value Ref Range Status   SARS Coronavirus 2 NEGATIVE NEGATIVE Final    Comment: (NOTE) SARS-CoV-2 target nucleic acids are NOT DETECTED.  The SARS-CoV-2 RNA is generally detectable in upper and lower respiratory specimens during the acute phase of infection. Negative results do not preclude SARS-CoV-2 infection, do not rule out co-infections with other pathogens, and should not be used as the sole basis for treatment or other patient management decisions. Negative results must be combined with clinical observations, patient history, and epidemiological information. The expected result is Negative.  Fact Sheet for Patients: SugarRoll.be  Fact Sheet for Healthcare Providers: https://www.woods-mathews.com/  This test is not yet approved or  cleared by the [place] FDA and  has been authorized for detection and/or diagnosis of SARS-CoV-2 by FDA under an Emergency Use Authorization (EUA). This EUA will remain  in effect (meaning this test can be used) for the duration of the COVID-19 declaration under Se ction 564(b)(1) of the Act, 21 U.S.C. section 360bbb-3(b)(1), unless the authorization is terminated or revoked sooner.  Performed at [facility], [place] [address], [place], [place] [address]      Radiology Studies: DG Knee Complete 4 Views Right  Result Date: 10/23/2020 CLINICAL DATA:  Right knee pain. EXAM: RIGHT KNEE - COMPLETE 4+ VIEW COMPARISON:  CT scan from 2017. FINDINGS: Tricompartmental degenerative changes most significant in the medial compartment with joint space narrowing and spurring. Irregularity of the medial femoral condyle articular surface is stable since the CT scan of 2017. No acute bony findings. No definite joint effusion. Advanced vascular calcifications. IMPRESSION: Tricompartmental degenerative changes most significant in the medial compartment. No acute bony findings or joint effusion. Electronically Signed   By: Marijo Sanes M.D.   On: 10/23/2020 16:20    Scheduled Meds: . insulin aspart  0-15 Units Subcutaneous TID WC  . insulin aspart  0-5 Units Subcutaneous QHS  . insulin glargine  10 Units Subcutaneous QHS  . levothyroxine  75 mcg Oral Daily  . lidocaine  1 patch Transdermal Q24H  . metoprolol succinate  50 mg Oral BID  . pantoprazole  40 mg Oral BID AC  . polyethylene glycol  17 g Oral Daily  . ranolazine  500 mg Oral BID  . rosuvastatin  10 mg Oral Daily  . sucralfate  1 g Oral TID WC & HS  . vitamin B-12  1,000 mcg Oral Daily   Continuous Infusions:    LOS: 6 days   Time spent: 25 minutes. More than 50% of the time was spent in counseling/coordination of care  Lorella Nimrod, MD [facility]  If 7PM-7AM, please contact  night-coverage [email or web address]  10/24/2020, 1:49 PM   This record has been created using Systems analyst. Errors have been sought and corrected,but may not always be located. Such creation errors do not reflect on the standard of care.

## 2020-10-24 NOTE — Plan of Care (Signed)
  Problem: Education: Goal: Knowledge of General Education information will improve Description: Including pain rating scale, medication(s)/side effects and non-pharmacologic comfort measures Outcome: Not Progressing   

## 2020-10-24 NOTE — TOC Initial Note (Addendum)
Transition of Care [facility]) - Initial/Assessment Note    Patient Details  Name: Regina [name] MRN: [id number] Date of Birth: [birth year]  [facility] of [facility]) CM/SW Contact:    Candie Chroman, LCSW Phone Number: 10/24/2020, 1:57 PM  Clinical Narrative: CSW met with patient. No supports at bedside. CSW introduced role and explained that PT recommendations would be discussed. Patient agreeable to SNF placement. First preference is [facility]. Left message for admissions coordinator to notify. Gave CMS scores for facilities within 25 miles of her zip code. Told her referral would be sent to all facilities in [facility] as backup plan if [facility] accept. Per chart, patient has not had her COVID vaccines. No further concerns. CSW encouraged patient to contact CSW as needed. CSW will continue to follow patient for support and facilitate discharge to SNF once medically stable and bed offers available.        4:56 pm: [facility] is able to accept patient. They will not know if they will have a bed or not until morning. Sent secure chat to MD to notify and requested a COVID test.           Expected Discharge Plan: Skilled Nursing Facility Barriers to Discharge: Continued Medical Work up,Insurance Authorization   Patient Goals and CMS Choice   CMS Medicare.gov Compare Post Acute Care list provided to:: Patient    Expected Discharge Plan and Services Expected Discharge Plan: [place] Choice: [place] arrangements for the past 2 months: Single Family Home                                      Prior Living Arrangements/Services Living arrangements for the past 2 months: Single Family Home Lives with:: Self Patient language and need for interpreter reviewed:: Yes Do you feel safe going back to the place where you live?: Yes      Need for Family Participation in Patient Care: Yes (Comment) Care  giver support system in place?: Yes (comment)   Criminal Activity/Legal Involvement Pertinent to Current Situation/Hospitalization: No - Comment as needed  Activities of Daily Living Home Assistive Devices/Equipment: None ADL Screening (condition at time of admission) Patient's cognitive ability adequate to safely complete daily activities?: Yes Is the patient deaf or have difficulty hearing?: No Does the patient have difficulty seeing, even when wearing glasses/contacts?: Yes Does the patient have difficulty concentrating, remembering, or making decisions?: No Patient able to express need for assistance with ADLs?: Yes Does the patient have difficulty dressing or bathing?: Yes Independently performs ADLs?: Yes (appropriate for developmental age) Does the patient have difficulty walking or climbing stairs?: Yes Weakness of Legs: None Weakness of Arms/Hands: None  Permission Sought/Granted Permission sought to share information with : Facility Art therapist granted to share information with : Yes, Verbal Permission Granted     Permission granted to share info w AGENCY: SNF's        Emotional Assessment Appearance:: Appears stated age Attitude/Demeanor/Rapport: Engaged Affect (typically observed): Accepting,Appropriate,Calm,Pleasant Orientation: : Oriented to Self,Oriented to Place,Oriented to  Time,Oriented to Situation Alcohol / Substance Use: Not Applicable Psych Involvement: No (comment)  Admission diagnosis:  Acute upper GI bleeding [K92.2] Generalized weakness [R53.1] Gastrointestinal hemorrhage, unspecified gastrointestinal hemorrhage type [K92.2] Patient Active Problem List   Diagnosis Date Noted  . Acute upper GI bleeding 10/17/2020  .  Pacemaker 12/16/2019  . History of cancer of left breast 12/16/2019  . Sick sinus syndrome due to SA node dysfunction ([place]) 12/16/2019  . Pressure injury of skin 09/26/2016  . C. difficile colitis 09/25/2016  .  Sepsis ([place]) 08/28/2016  . Cellulitis 06/26/2016  . UTI (lower urinary tract infection) 02/20/2015  . Elevated troponin 02/20/2015  . Hypoglycemia 02/20/2015  . Type 2 diabetes mellitus ([place]) 02/20/2015  . Essential hypertension 02/20/2015  . HLD (hyperlipidemia) 02/20/2015  . Paroxysmal atrial fibrillation ([place]) 02/20/2015  . GERD (gastroesophageal reflux disease) 02/20/2015   PCP:  Donnie Coffin, MD Pharmacy:   [id number] [place], [place] - [address] [place] [address] Phone: [phone] Fax: [phone]  [facility], [address], [address] [place], [address] [place] [address] Phone: [phone] Fax: [place], [place] - [address] [place] [address] Phone: [phone] Fax: [phone]  [id number] [place], [place] - [address] [place] [address] Phone: [phone] Fax: [phone]     Social Determinants of Health (SDOH) Interventions    Readmission Risk Interventions No flowsheet data found.

## 2020-10-24 NOTE — Anesthesia Postprocedure Evaluation (Signed)
Anesthesia Post Note  Patient: Regina [name]  Procedure(s) Performed: ESOPHAGOGASTRODUODENOSCOPY (EGD) WITH PROPOFOL (N/A )  Patient location during evaluation: Endoscopy Anesthesia Type: General Level of consciousness: awake and alert and oriented Pain management: pain level controlled Vital Signs Assessment: post-procedure vital signs reviewed and stable Respiratory status: spontaneous breathing Cardiovascular status: blood pressure returned to baseline Anesthetic complications: no   No complications documented.   Last Vitals:  Vitals:   10/24/20 0427 10/24/20 0812  BP: (!) 104/49 (!) 102/39  Pulse: 70 63  Resp: 16 16  Temp: 37.3 C 36.8 C  SpO2: 98% 99%    Last Pain:  Vitals:   10/24/20 0812  TempSrc: Oral  PainSc:                  Regina [name]

## 2020-10-25 DIAGNOSIS — K922 Gastrointestinal hemorrhage, unspecified: Secondary | ICD-10-CM | POA: Diagnosis not present

## 2020-10-25 LAB — SARS CORONAVIRUS 2 (TAT 6-24 HRS): SARS Coronavirus 2: NEGATIVE

## 2020-10-25 LAB — GLUCOSE, CAPILLARY
Glucose-Capillary: 234 mg/dL — ABNORMAL HIGH (ref 70–99)
Glucose-Capillary: 291 mg/dL — ABNORMAL HIGH (ref 70–99)
Glucose-Capillary: 255 mg/dL — ABNORMAL HIGH (ref 70–99)
Glucose-Capillary: 227 mg/dL — ABNORMAL HIGH (ref 70–99)

## 2020-10-25 MED ORDER — LIDOCAINE 5 % EX PTCH
3.0000 | MEDICATED_PATCH | CUTANEOUS | Status: DC
  Administered 2020-10-25: 2 via TRANSDERMAL
  Administered 2020-10-27: 3 via TRANSDERMAL
  Filled 2020-10-25 (×6): qty 3

## 2020-10-25 MED ORDER — METOPROLOL SUCCINATE ER 50 MG PO TB24
50.0000 mg | ORAL_TABLET | Freq: Every day | ORAL | Status: DC
  Administered 2020-10-26 – 2020-10-28 (×3): 50 mg via ORAL
  Filled 2020-10-25 (×3): qty 1

## 2020-10-25 NOTE — Progress Notes (Signed)
PROGRESS NOTE    Regina [name]  GGY:[id number] DOB: [birth year] DOA: 10/17/2020 PCP: Donnie Coffin, MD   Brief Narrative: Taken from H&P. Regina [name] is a 87 y.o. female with medical history significant for GERD, chronic low back pain, sick sinus syndrome status post pacemaker, hypothyroid, insulin-dependent diabetes mellitus, CAD, presented to the emergency department for chief concerns of weakness and dizziness.  She was having intermittent dark-colored stools for some time, unable to specify the duration.  Had couple of them back to back yesterday.  Patient was on aspirin and Plavix. Per patient she did had 2 loose black-colored bowel movements overnight but no documentation. Admitted for symptomatic anemia secondary to upper GI bleed with hemoglobin of 7.1, received 1 unit of PRBC with improvement in hemoglobin to 8.8, trending down to 7.8 this morning.  Hemoglobin at 9.5 after second unit.  GI was consulted-EGD on 10/20/2020  positive for a small esophageal ulcer with stigmata of bleeding.  PT recommended SNF placement-waiting for bed availability and insurance authorization, she wants to go to [facility].  Subjective: Pt's main complaint was wanting more lidocaine patches.  Normal oral intake.  Had BM's.   Assessment & Plan:   Principal Problem:   Acute upper GI bleeding Active Problems:   Type 2 diabetes mellitus (HCC)   Essential hypertension   HLD (hyperlipidemia)   Paroxysmal atrial fibrillation (HCC)   GERD (gastroesophageal reflux disease)   Pacemaker   History of cancer of left breast   Sick sinus syndrome due to SA node dysfunction (HCC)  Symptomatic anemia secondary to upper GI bleed.  Hemoglobin continued to improve, currently at 9.9.  Received 2 units of PRBCs.  Anemia panel consistent with anemia of chronic disease.  EGD with a bleeding small esophageal ulcer.  Melena resolved. -Monitor hemoglobin -Transfuse if below 8. --resume ASA and d/c plavix at  discharge (CAD s/p PCI in 2009 as mentioned in past medical history, no other documentation, could not find another reason to stay on DAPT) -PT/OT recommending SNF placement.  Hypertension.   Blood pressure low today -Keep holding home dose of Bumex and lisinopril  --reduce Toprol to daily 50 mg  Paroxysmal atrial fibrillation.  Patient has an history of prior GI bleed per cardiology note but could not find any documentation.  Not a candidate for anticoagulation.  Heart rate improved after restarting home dose of metoprolol. --BP low today --reduce Toprol to daily 50 mg  Sick sinus syndrome S/p pacemaker.   No acute concern. -She will continue outpatient follow-up for pacemaker interrogation with cardiology.  Hyperlipidemia. -Continue home dose of statin.  Insulin-dependent diabetes mellitus.  A1c of 5.4.  CBG within goal now after adding Lantus.  Patient was on 70/30 at home. --cont Lantus 10u daily -Continue with SSI  Hypothyroidism. -Continue home dose of Synthroid  Stage 3 obesity. Body mass index is 45.91 kg/m.    Objective: Vitals:   10/24/20 1543 10/24/20 1954 10/25/20 0331 10/25/20 1155  BP: (!) 117/36 118/61 (!) 114/97 (!) 90/37  Pulse: 92 76 92 60  Resp: 16 20 18 18   Temp: 98.6 F (37 C) 98.6 F (37 C) 98.7 F (37.1 C) (!) 97.4 F (36.3 C)  TempSrc:   Oral Oral  SpO2: 97% 99% 98% 95%  Weight:      Height:        Intake/Output Summary (Last 24 hours) at 10/25/2020 1446 Last data filed at 10/25/2020 1347 Gross per 24 hour  Intake 960 ml  Output 225 ml  Net 735 ml   Filed Weights   10/20/20 0500 10/21/20 0624 10/22/20 0555  Weight: 95.8 kg 96.8 kg 103.1 kg    Examination:  Constitutional: NAD, AAOx3 HEENT: conjunctivae and lids normal, EOMI CV: No cyanosis.   RESP: normal respiratory effort, on RA Extremities: No effusions, edema in BLE.  Left hand swollen. SKIN: warm, dry Neuro: II - XII grossly intact.   Psych: Normal mood and affect.   Appropriate judgement and reason   DVT prophylaxis: SCDs Code Status: Full Family Communication:  Disposition Plan:  Status is: Inpatient   Dispo: The patient is from: Home              Anticipated d/c is to: SNF              Patient currently is medically stable.   Difficult to place patient No               Level of care: Med-Surg  All the records are reviewed and case discussed with Care Management/Social Worker. Management plans discussed with the patient, nursing and they are in agreement.  Consultants:   GI  Procedures:  Antimicrobials:   Data Reviewed: I have personally reviewed following labs and imaging studies  CBC: Recent Labs  Lab 10/20/20 0536 10/20/20 2105 10/21/20 0459 10/22/20 0611 10/24/20 0650  WBC 6.0  --  6.9 7.5 8.4  HGB 7.8* 9.7* 9.5* 10.5* 9.9*  HCT 24.3* 29.7* 29.1* 31.1* 30.4*  MCV 94.6  --  91.8 91.2 92.1  PLT 216  --  183 215 161   Basic Metabolic Panel: Recent Labs  Lab 10/19/20 0900  NA 136  K 4.4  CL 106  CO2 23  GLUCOSE 207*  BUN 48*  CREATININE 1.77*  CALCIUM 8.0*   GFR: Estimated Creatinine Clearance: 24.7 mL/min (A) (by C-G formula based on SCr of 1.77 mg/dL (H)). Liver Function Tests: No results for input(s): AST, ALT, ALKPHOS, BILITOT, PROT, ALBUMIN in the last 168 hours. No results for input(s): LIPASE, AMYLASE in the last 168 hours. No results for input(s): AMMONIA in the last 168 hours. Coagulation Profile: No results for input(s): INR, PROTIME in the last 168 hours. Cardiac Enzymes: No results for input(s): CKTOTAL, CKMB, CKMBINDEX, TROPONINI in the last 168 hours. BNP (last 3 results) No results for input(s): PROBNP in the last 8760 hours. HbA1C: No results for input(s): HGBA1C in the last 72 hours. CBG: Recent Labs  Lab 10/24/20 1419 10/24/20 1608 10/24/20 2144 10/25/20 0759 10/25/20 1153  GLUCAP 136* 116* 195* 234* 291*   Lipid Profile: No results for input(s): CHOL, HDL, LDLCALC, TRIG, CHOLHDL,  LDLDIRECT in the last 72 hours. Thyroid Function Tests: No results for input(s): TSH, T4TOTAL, FREET4, T3FREE, THYROIDAB in the last 72 hours. Anemia Panel: No results for input(s): VITAMINB12, FOLATE, FERRITIN, TIBC, IRON, RETICCTPCT in the last 72 hours. Sepsis Labs: No results for input(s): PROCALCITON, LATICACIDVEN in the last 168 hours.  Recent Results (from the past 240 hour(s))  SARS CORONAVIRUS 2 (TAT 6-24 HRS) Nasopharyngeal Nasopharyngeal Swab     Status: None   Collection Time: 10/17/20  2:09 PM   Specimen: Nasopharyngeal Swab  Result Value Ref Range Status   SARS Coronavirus 2 NEGATIVE NEGATIVE Final    Comment: (NOTE) SARS-CoV-2 target nucleic acids are NOT DETECTED.  The SARS-CoV-2 RNA is generally detectable in upper and lower respiratory specimens during the acute phase of infection. Negative results do not preclude SARS-CoV-2 infection, do not  rule out co-infections with other pathogens, and should not be used as the sole basis for treatment or other patient management decisions. Negative results must be combined with clinical observations, patient history, and epidemiological information. The expected result is Negative.  Fact Sheet for Patients: SugarRoll.be  Fact Sheet for Healthcare Providers: https://www.woods-mathews.com/  This test is not yet approved or cleared by the [place] FDA and  has been authorized for detection and/or diagnosis of SARS-CoV-2 by FDA under an Emergency Use Authorization (EUA). This EUA will remain  in effect (meaning this test can be used) for the duration of the COVID-19 declaration under Se ction 564(b)(1) of the Act, 21 U.S.C. section 360bbb-3(b)(1), unless the authorization is terminated or revoked sooner.  Performed at [facility], [place] [address], [place], [place] [address]      Radiology Studies: DG Knee Complete 4 Views Right  Result Date: 10/23/2020 CLINICAL  DATA:  Right knee pain. EXAM: RIGHT KNEE - COMPLETE 4+ VIEW COMPARISON:  CT scan from 2017. FINDINGS: Tricompartmental degenerative changes most significant in the medial compartment with joint space narrowing and spurring. Irregularity of the medial femoral condyle articular surface is stable since the CT scan of 2017. No acute bony findings. No definite joint effusion. Advanced vascular calcifications. IMPRESSION: Tricompartmental degenerative changes most significant in the medial compartment. No acute bony findings or joint effusion. Electronically Signed   By: Marijo Sanes M.D.   On: 10/23/2020 16:20    Scheduled Meds: . insulin aspart  0-15 Units Subcutaneous TID WC  . insulin aspart  0-5 Units Subcutaneous QHS  . insulin glargine  10 Units Subcutaneous QHS  . levothyroxine  75 mcg Oral Daily  . lidocaine  3 patch Transdermal Q24H  . metoprolol succinate  50 mg Oral BID  . pantoprazole  40 mg Oral BID AC  . polyethylene glycol  17 g Oral Daily  . ranolazine  500 mg Oral BID  . rosuvastatin  10 mg Oral Daily  . sucralfate  1 g Oral TID WC & HS  . vitamin B-12  1,000 mcg Oral Daily   Continuous Infusions:    LOS: 7 days    Enzo Bi, MD [facility]  If 7PM-7AM, please contact night-coverage [email or web address]  10/25/2020, 2:46 PM

## 2020-10-25 NOTE — Care Management Important Message (Signed)
Important Message  Patient Details  Name: Regina [name] MRN: [id number] Date of Birth: 10/11/1933   Medicare Important Message Given:  Yes     Juliann Pulse A Winter Jocelyn 10/25/2020, 11:08 AM

## 2020-10-25 NOTE — TOC Progression Note (Signed)
Transition of Care [facility]) - Progression Note    Patient Details  Name: ZADIE DEEMER MRN: [id number] Date of Birth: 24-Aug-1933  Transition of [facility]) CM/SW Badger Lee, LCSW Phone Number: 10/25/2020, 12:57 PM  Clinical Narrative:  Insurance authorization approved for [facility]. Admissions coordinator said they would have a bed available tomorrow. Patient and daughter are aware and agreeable.  Expected Discharge Plan: [place] Barriers to Discharge: Continued Medical Work up,Insurance Authorization  Expected Discharge Plan and Services Expected Discharge Plan: [place] Choice: [place] arrangements for the past 2 months: Single Family Home                                       Social Determinants of Health (SDOH) Interventions    Readmission Risk Interventions No flowsheet data found.

## 2020-10-25 NOTE — Plan of Care (Signed)

## 2020-10-26 DIAGNOSIS — K922 Gastrointestinal hemorrhage, unspecified: Secondary | ICD-10-CM | POA: Diagnosis not present

## 2020-10-26 LAB — BASIC METABOLIC PANEL
Anion gap: 7 (ref 5–15)
BUN: 56 mg/dL — ABNORMAL HIGH (ref 8–23)
CO2: 22 mmol/L (ref 22–32)
Calcium: 8.2 mg/dL — ABNORMAL LOW (ref 8.9–10.3)
Chloride: 99 mmol/L (ref 98–111)
Creatinine, Ser: 2.37 mg/dL — ABNORMAL HIGH (ref 0.44–1.00)
GFR, Estimated: 20 mL/min — ABNORMAL LOW (ref >60)
Glucose, Bld: 215 mg/dL — ABNORMAL HIGH (ref 70–99)
Potassium: 4.9 mmol/L (ref 3.5–5.1)
Sodium: 128 mmol/L — ABNORMAL LOW (ref 135–145)

## 2020-10-26 LAB — CBC
HCT: 27 % — ABNORMAL LOW (ref 36.0–46.0)
Hemoglobin: 9 g/dL — ABNORMAL LOW (ref 12.0–15.0)
MCH: 30.7 pg (ref 26.0–34.0)
MCHC: 33.3 g/dL (ref 30.0–36.0)
MCV: 92.2 fL (ref 80.0–100.0)
Platelets: 273 10*3/uL (ref 150–400)
RBC: 2.93 MIL/uL — ABNORMAL LOW (ref 3.87–5.11)
RDW: 14.7 % (ref 11.5–15.5)
WBC: 10 10*3/uL (ref 4.0–10.5)
nRBC: 0 % (ref 0.0–0.2)

## 2020-10-26 LAB — GLUCOSE, CAPILLARY
Glucose-Capillary: 208 mg/dL — ABNORMAL HIGH (ref 70–99)
Glucose-Capillary: 195 mg/dL — ABNORMAL HIGH (ref 70–99)
Glucose-Capillary: 206 mg/dL — ABNORMAL HIGH (ref 70–99)
Glucose-Capillary: 175 mg/dL — ABNORMAL HIGH (ref 70–99)

## 2020-10-26 LAB — MAGNESIUM: Magnesium: 2.2 mg/dL (ref 1.7–2.4)

## 2020-10-26 MED ORDER — INSULIN GLARGINE 100 UNIT/ML ~~LOC~~ SOLN
13.0000 [IU] | Freq: Every day | SUBCUTANEOUS | Status: DC
  Administered 2020-10-26 – 2020-10-27 (×2): 13 [IU] via SUBCUTANEOUS
  Filled 2020-10-26 (×3): qty 0.13

## 2020-10-26 MED ORDER — SODIUM CHLORIDE 0.9 % IV SOLN: INTRAVENOUS | Status: AC

## 2020-10-26 MED ORDER — INSULIN ASPART 100 UNIT/ML ~~LOC~~ SOLN
3.0000 [IU] | Freq: Three times a day (TID) | SUBCUTANEOUS | Status: DC
  Administered 2020-10-27 – 2020-10-28 (×5): 3 [IU] via SUBCUTANEOUS
  Filled 2020-10-26 (×5): qty 1

## 2020-10-26 NOTE — Progress Notes (Signed)
Occupational Therapy Treatment Patient Details Name: Regina [name] MRN: [id number] DOB: 05-16-1934 Today's Date: 10/26/2020    History of present illness Regina [name] is an 27yoF who comes to [facility] c symptomatic anemia, found to have GIB, now s/p PRBC infusion, albeit pressures remain low 90s/50s. Pt underwent Upper GI endoscophy on 10/20/20. PMH: GERD, low back pain, SSS s/p PPM, hypothyroidism, IDDM, CAD, L breast CA. At baseline pt lives alone, limited household distance AMB c fixed objects or RW, needs support daily for ADL performance.   OT comments  Regina [name] was seen for OT/PT co-treatment on this date, seen with PT 2/2 poor activity tolerance and anxiousness with mobility. Upon arrival to room pt reclined in bed, urine soaked pad. MAX encouragement for pt to agree to OOB to chair for bed change. MAX A for LBD seated EOB. MOD A for BSC t/f, +2 for RW mgmt and pt anxiety. MAX A x2 + RW for perihygiene in standing, pt had small liquid BM. Pt left in chair with all needs in reach, call bell, and chair alarm on. Pt making progress toward goals. Pt continues to benefit from skilled OT services to maximize return to PLOF and minimize risk of future falls, injury, caregiver burden, and readmission. Will continue to follow POC. Discharge recommendation remains appropriate.    Follow Up Recommendations  SNF    Equipment Recommendations  Other (comment) (TBD)    Recommendations for Other Services      Precautions / Restrictions Precautions Precautions: Fall Restrictions Weight Bearing Restrictions: No       Mobility Bed Mobility Overal bed mobility: Needs Assistance Bed Mobility: Supine to Sit Supine to sit: Mod assist      Transfers Overall transfer level: Needs assistance Equipment used: Rolling walker (2 wheeled) Transfers: Sit to/from [facility] Sit to Stand: Mod assist;+2 safety/equipment Stand pivot transfers: Mod assist;+2 physical assistance       General  transfer comment: Pt was able to stand from EOB, BSC, and recliner with mod asisst + vcs. +2 for RW mgmt and anxiousness    Balance Overall balance assessment: Needs assistance Sitting-balance support: Feet supported;Bilateral upper extremity supported Sitting balance-Leahy Scale: Fair     Standing balance support: Bilateral upper extremity supported;During functional activity Standing balance-Leahy Scale: Poor Standing balance comment: anxietywith all standing activity                           ADL either performed or assessed with clinical judgement   ADL Overall ADL's : Needs assistance/impaired                                       General ADL Comments: MAX A for LBD seated EOB. MOD A for BSC t/f, +2 for RW mgmt and pt anxiety. MAX A x2 + RW perihygiene in standing               Cognition Arousal/Alertness: Awake/alert Behavior During Therapy: Anxious Overall Cognitive Status: Within Functional Limits for tasks assessed                                 General Comments: Self limiting, pain limiting, requires MAX cuing/encouragement for redirection        Exercises Exercises: Other exercises Other Exercises Other Exercises: Pt edcuated re; OT  role, DME recs, d.c recs, falls prevention Other Exercises: LBD, self-drinking, toileting, sup>sit, sit<>stand, SPT, sitting/standing balance/tolerance           Pertinent Vitals/ Pain       Pain Assessment: No/denies pain Faces Pain Scale: No hurt Pain Location: R knee Pain Descriptors / Indicators: Crying;Discomfort Pain Intervention(s): Limited activity within patient's tolerance         Frequency  Min 1X/week        Progress Toward Goals  OT Goals(current goals can now be found in the care plan section)  Progress towards OT goals: Progressing toward goals  Acute Rehab OT Goals Patient Stated Goal: regain strength, return to home OT Goal Formulation: With  patient Time For Goal Achievement: 11/02/20 Potential to Achieve Goals: Good ADL Goals Pt Will Perform Grooming: with set-up;with supervision;standing Pt Will Perform Lower Body Dressing: with mod assist;sitting/lateral leans Pt Will Transfer to Toilet: with modified independence;ambulating;regular height toilet  Plan Discharge plan remains appropriate;Frequency remains appropriate    Co-evaluation    PT/OT/SLP Co-Evaluation/Treatment: Yes Reason for Co-Treatment: Necessary to address cognition/behavior during functional activity          AM-PAC OT "6 Clicks" Daily Activity     Outcome Measure   Help from another person eating meals?: A Little Help from another person taking care of personal grooming?: A Little Help from another person toileting, which includes using toliet, bedpan, or urinal?: A Lot Help from another person bathing (including washing, rinsing, drying)?: A Lot Help from another person to put on and taking off regular upper body clothing?: A Little Help from another person to put on and taking off regular lower body clothing?: A Lot 6 Click Score: 15    End of Session Equipment Utilized During Treatment: Rolling walker  OT Visit Diagnosis: Other abnormalities of gait and mobility (R26.89);Muscle weakness (generalized) (M62.81)   Activity Tolerance Patient tolerated treatment well   Patient Left in chair;with call bell/phone within reach;with chair alarm set   Nurse Communication Mobility status        Time: 1464-3142 OT Time Calculation (min): 26 min  Charges: OT General Charges $OT Visit: 1 Visit OT Treatments $Self Care/Home Management : 8-22 mins  Dessie Coma, M.S. OTR/L  10/26/20, 4:45 PM  ascom [phone]

## 2020-10-26 NOTE — Progress Notes (Signed)
Inpatient Diabetes Program Recommendations  AACE/ADA: New Consensus Statement on Inpatient Glycemic Control   Target Ranges:  Prepandial:   less than 140 mg/dL      Peak postprandial:   less than 180 mg/dL (1-2 hours)      Critically ill patients:  140 - 180 mg/dL   Results for Regina [name], Regina [name] (MRN [id number]) as of 10/26/2020 10:42  Ref. Range 10/25/2020 07:59 10/25/2020 11:53 10/25/2020 16:23 10/25/2020 21:30 10/26/2020 08:01  Glucose-Capillary Latest Ref Range: 70 - 99 mg/dL 234 (H) 291 (H) 255 (H) 227 (H) 208 (H)   Review of Glycemic Control  Diabetes history: DM2 Outpatient Diabetes medications: 70/30 40 units QPM Current orders for Inpatient glycemic control: Lantus 10 units QHS, Novolog 0-15 units TID with meals, Novolog 0-5 units QHS  Inpatient Diabetes Program Recommendations:    Insulin: Please consider increasing Lantus to 13 units QHS and ordering Novolog 3 units TID with meals for meal coverage if patient eats at least 50% of meals.  Thanks, Barnie Alderman, RN, MSN, CDE Diabetes Coordinator Inpatient Diabetes Program [phone] (Team Pager from 8am to 5pm)

## 2020-10-26 NOTE — Progress Notes (Signed)
°   10/26/20 1105  Clinical Encounter Type  Visited With Patient  Visit Type Initial  Referral From Nurse  Consult/Referral To Chaplain  Spiritual Encounters  Spiritual Needs Prayer  Chaplain Ormond visited Pt in room 157 A, Mrs. Regina [name]. I provided a ministry of presence and prayer.

## 2020-10-26 NOTE — Progress Notes (Signed)
PT Cancellation Note  Patient Details Name: Regina [name] MRN: [id number] DOB: Oct 10, 1933   Cancelled Treatment:      PT attempt. Pt refused. " I can't do this now!" After lengthy conversation, pt agrees to 2 PM treatment. Will need +2 assist for OOB activity.  Willette Pa 10/26/2020, 11:17 AM

## 2020-10-26 NOTE — Progress Notes (Signed)
PROGRESS NOTE    Regina [name]  NID:[id number] DOB: Dec 01, 1933 DOA: 10/17/2020 PCP: Donnie Coffin, MD   Brief Narrative: Taken from H&P. Regina [name] is a 87 y.o. female with medical history significant for GERD, chronic low back pain, sick sinus syndrome status post pacemaker, hypothyroid, insulin-dependent diabetes mellitus, CAD, presented to the emergency department for chief concerns of weakness and dizziness.  She was having intermittent dark-colored stools for some time, unable to specify the duration.  Had couple of them back to back yesterday.  Patient was on aspirin and Plavix. Per patient she did had 2 loose black-colored bowel movements overnight but no documentation. Admitted for symptomatic anemia secondary to upper GI bleed with hemoglobin of 7.1, received 1 unit of PRBC with improvement in hemoglobin to 8.8, trending down to 7.8 this morning.  Hemoglobin at 9.5 after second unit.  GI was consulted-EGD on 10/20/2020  positive for a small esophageal ulcer with stigmata of bleeding.  PT recommended SNF placement-waiting for bed availability and insurance authorization, she wants to go to [facility].  Subjective: Pt had no new complaints.  Left hand swelling improved after wrist band removed.     Assessment & Plan:   Principal Problem:   Acute upper GI bleeding Active Problems:   Type 2 diabetes mellitus (HCC)   Essential hypertension   HLD (hyperlipidemia)   Paroxysmal atrial fibrillation (HCC)   GERD (gastroesophageal reflux disease)   Pacemaker   History of cancer of left breast   Sick sinus syndrome due to SA node dysfunction (HCC)  Symptomatic anemia secondary to upper GI bleed.  Hemoglobin continued to improve, currently at 9.9.  Received 2 units of PRBCs.  Anemia panel consistent with anemia of chronic disease.  EGD with a bleeding small esophageal ulcer.  Melena resolved. -Monitor hemoglobin -Transfuse if below 8. --resume ASA and d/c plavix at  discharge (CAD s/p PCI in 2009 as mentioned in past medical history, no other documentation, could not find another reason to stay on DAPT) -PT/OT recommending SNF placement.  AKI, not POA --Cr 2.37 this morning, up from 1.77 on 3/10.  Plan: --LR@100  for 10 hours  Hyponatremia, not POA --Na 128 this morning, down from 136 on 3/10. Plan: --LR@100  for 10 hours  Hypertension.   Blood pressure low today -Keep holding home dose of Bumex and lisinopril  --cont Toprol 50 mg daily   Paroxysmal atrial fibrillation.  Patient has an history of prior GI bleed per cardiology note but could not find any documentation.  Not a candidate for anticoagulation.  Heart rate improved after restarting home dose of metoprolol. --BP low today --cont Toprol 50 mg daily   Sick sinus syndrome S/p pacemaker.   No acute concern. -She will continue outpatient follow-up for pacemaker interrogation with cardiology.  Hyperlipidemia. -Continue home dose of statin.  Insulin-dependent diabetes mellitus.  A1c of 5.4.  CBG within goal now after adding Lantus.  Patient was on 70/30 at home. --increase Lantus to 13u daily --add mealtime 3u TID -Continue with SSI  Hypothyroidism. -Continue home dose of Synthroid  Stage 3 obesity. Body mass index is 45.91 kg/m.    Objective: Vitals:   10/26/20 0034 10/26/20 0535 10/26/20 0759 10/26/20 1255  BP: (!) 114/34 (!) 111/46 (!) 116/40 (!) 108/48  Pulse: (!) 59 (!) 59 60 (!) 59  Resp: 16 18 16 15   Temp: 98.2 F (36.8 C) 98.6 F (37 C) 98 F (36.7 C) 98.5 F (36.9 C)  TempSrc:  SpO2: 100% 97% 98% 96%  Weight:      Height:        Intake/Output Summary (Last 24 hours) at 10/26/2020 1433 Last data filed at 10/26/2020 1015 Gross per 24 hour  Intake 360 ml  Output 0 ml  Net 360 ml   Filed Weights   10/20/20 0500 10/21/20 0624 10/22/20 0555  Weight: 95.8 kg 96.8 kg 103.1 kg    Examination:  Constitutional: NAD, AAOx3 HEENT: conjunctivae and lids  normal, EOMI CV: No cyanosis.   RESP: normal respiratory effort, on RA Extremities: No effusions, edema in BLE.  Left hand swollen, but improved. SKIN: warm, dry Neuro: II - XII grossly intact.   Psych: Normal mood and affect.  Appropriate judgement and reason   DVT prophylaxis: SCDs Code Status: Full Family Communication: pt didn't want me to call her daughter today Disposition Plan:  Status is: Inpatient   Dispo: The patient is from: Home              Anticipated d/c is to: SNF              Patient currently is NOT medically stable.  New AKI on IVF.    Difficult to place patient No               Level of care: Med-Surg  All the records are reviewed and case discussed with Care Management/Social Worker. Management plans discussed with the patient, nursing and they are in agreement.  Consultants:   GI  Procedures:  Antimicrobials:   Data Reviewed: I have personally reviewed following labs and imaging studies  CBC: Recent Labs  Lab 10/20/20 0536 10/20/20 2105 10/21/20 0459 10/22/20 0611 10/24/20 0650 10/26/20 0650  WBC 6.0  --  6.9 7.5 8.4 10.0  HGB 7.8* 9.7* 9.5* 10.5* 9.9* 9.0*  HCT 24.3* 29.7* 29.1* 31.1* 30.4* 27.0*  MCV 94.6  --  91.8 91.2 92.1 92.2  PLT 216  --  183 215 211 240   Basic Metabolic Panel: Recent Labs  Lab 10/26/20 0650  NA 128*  K 4.9  CL 99  CO2 22  GLUCOSE 215*  BUN 56*  CREATININE 2.37*  CALCIUM 8.2*  MG 2.2   GFR: Estimated Creatinine Clearance: 18.4 mL/min (A) (by C-G formula based on SCr of 2.37 mg/dL (H)). Liver Function Tests: No results for input(s): AST, ALT, ALKPHOS, BILITOT, PROT, ALBUMIN in the last 168 hours. No results for input(s): LIPASE, AMYLASE in the last 168 hours. No results for input(s): AMMONIA in the last 168 hours. Coagulation Profile: No results for input(s): INR, PROTIME in the last 168 hours. Cardiac Enzymes: No results for input(s): CKTOTAL, CKMB, CKMBINDEX, TROPONINI in the last 168 hours. BNP  (last 3 results) No results for input(s): PROBNP in the last 8760 hours. HbA1C: No results for input(s): HGBA1C in the last 72 hours. CBG: Recent Labs  Lab 10/25/20 1153 10/25/20 1623 10/25/20 2130 10/26/20 0801 10/26/20 1305  GLUCAP 291* 255* 227* 208* 195*   Lipid Profile: No results for input(s): CHOL, HDL, LDLCALC, TRIG, CHOLHDL, LDLDIRECT in the last 72 hours. Thyroid Function Tests: No results for input(s): TSH, T4TOTAL, FREET4, T3FREE, THYROIDAB in the last 72 hours. Anemia Panel: No results for input(s): VITAMINB12, FOLATE, FERRITIN, TIBC, IRON, RETICCTPCT in the last 72 hours. Sepsis Labs: No results for input(s): PROCALCITON, LATICACIDVEN in the last 168 hours.  Recent Results (from the past 240 hour(s))  SARS CORONAVIRUS 2 (TAT 6-24 HRS) Nasopharyngeal Nasopharyngeal Swab  Status: None   Collection Time: 10/17/20  2:09 PM   Specimen: Nasopharyngeal Swab  Result Value Ref Range Status   SARS Coronavirus 2 NEGATIVE NEGATIVE Final    Comment: (NOTE) SARS-CoV-2 target nucleic acids are NOT DETECTED.  The SARS-CoV-2 RNA is generally detectable in upper and lower respiratory specimens during the acute phase of infection. Negative results do not preclude SARS-CoV-2 infection, do not rule out co-infections with other pathogens, and should not be used as the sole basis for treatment or other patient management decisions. Negative results must be combined with clinical observations, patient history, and epidemiological information. The expected result is Negative.  Fact Sheet for Patients: SugarRoll.be  Fact Sheet for Healthcare Providers: https://www.woods-mathews.com/  This test is not yet approved or cleared by the [place] FDA and  has been authorized for detection and/or diagnosis of SARS-CoV-2 by FDA under an Emergency Use Authorization (EUA). This EUA will remain  in effect (meaning this test can be used) for  the duration of the COVID-19 declaration under Se ction 564(b)(1) of the Act, 21 U.S.C. section 360bbb-3(b)(1), unless the authorization is terminated or revoked sooner.  Performed at [facility], [place] [address], [place], [place] [address]   SARS CORONAVIRUS 2 (TAT 6-24 HRS) Nasopharyngeal Nasopharyngeal Swab     Status: None   Collection Time: 10/25/20 12:15 PM   Specimen: Nasopharyngeal Swab  Result Value Ref Range Status   SARS Coronavirus 2 NEGATIVE NEGATIVE Final    Comment: (NOTE) SARS-CoV-2 target nucleic acids are NOT DETECTED.  The SARS-CoV-2 RNA is generally detectable in upper and lower respiratory specimens during the acute phase of infection. Negative results do not preclude SARS-CoV-2 infection, do not rule out co-infections with other pathogens, and should not be used as the sole basis for treatment or other patient management decisions. Negative results must be combined with clinical observations, patient history, and epidemiological information. The expected result is Negative.  Fact Sheet for Patients: SugarRoll.be  Fact Sheet for Healthcare Providers: https://www.woods-mathews.com/  This test is not yet approved or cleared by the [place] FDA and  has been authorized for detection and/or diagnosis of SARS-CoV-2 by FDA under an Emergency Use Authorization (EUA). This EUA will remain  in effect (meaning this test can be used) for the duration of the COVID-19 declaration under Se ction 564(b)(1) of the Act, 21 U.S.C. section 360bbb-3(b)(1), unless the authorization is terminated or revoked sooner.  Performed at [facility], [place] [address], [place], [place] [address]      Radiology Studies: No results found.  Scheduled Meds: . insulin aspart  0-15 Units Subcutaneous TID WC  . insulin aspart  0-5 Units Subcutaneous QHS  . insulin glargine  10 Units Subcutaneous QHS  . levothyroxine  75 mcg Oral  Daily  . lidocaine  3 patch Transdermal Q24H  . metoprolol succinate  50 mg Oral Q breakfast  . pantoprazole  40 mg Oral BID AC  . polyethylene glycol  17 g Oral Daily  . ranolazine  500 mg Oral BID  . rosuvastatin  10 mg Oral Daily  . sucralfate  1 g Oral TID WC & HS  . vitamin B-12  1,000 mcg Oral Daily   Continuous Infusions: . sodium chloride 100 mL/hr at 10/26/20 1050     LOS: 8 days    Enzo Bi, MD [facility]  If 7PM-7AM, please contact night-coverage [email or web address]  10/26/2020, 2:33 PM

## 2020-10-26 NOTE — Progress Notes (Signed)
Physical Therapy Treatment Patient Details Name: Regina [name] MRN: [id number] DOB: October 17, 1933 Today's Date: 10/26/2020    History of Present Illness Regina [name] is an 53yoF who comes to [facility] c symptomatic anemia, found to have GIB, now s/p PRBC infusion, albeit pressures remain low 90s/50s. Pt underwent Upper GI endoscophy on 10/20/20. PMH: GERD, low back pain, SSS s/p PPM, hypothyroidism, IDDM, CAD, L breast CA. At baseline pt lives alone, limited household distance AMB c fixed objects or RW, needs support daily for ADL performance.    PT Comments    Pt was long sitting in bed. Agrees to PT/OT co-treat. Needed co-treat 2/2 to poor activity tolerance and unable to tolerate back to back session. Pt is alert and oriented however is very anxious with all mobility/transfers/and gait. Needs increased time to perform desired task. More self limiting than strength/endurance limiting. Pt will greatly benefit from SNF at [place] to address deficits while improving independence with ADLs.    Follow Up Recommendations  SNF     Equipment Recommendations  Other (comment) (defer to next level of care)    Recommendations for Other Services       Precautions / Restrictions Precautions Precautions: Fall Restrictions Weight Bearing Restrictions: No    Mobility  Bed Mobility Overal bed mobility: Needs Assistance Bed Mobility: Supine to Sit Rolling: Max assist   Supine to sit: Mod assist Sit to supine: Mod assist;+2 for safety/equipment        Transfers Overall transfer level: Needs assistance Equipment used: Rolling walker (2 wheeled) Transfers: Sit to/from Stand Sit to Stand: Mod assist;+2 safety/equipment         General transfer comment: Pt was able to stand from EOB, BSC, and recliner with mod asisst + vcs  Ambulation/Gait Ambulation/Gait assistance: Min assist Gait Distance (Feet): 3 Feet Assistive device: Rolling walker (2 wheeled)   Gait velocity: decreased   General Gait  Details: EOB to recliner       Balance Overall balance assessment: Needs assistance Sitting-balance support: Feet supported;Bilateral upper extremity supported Sitting balance-Leahy Scale: Fair     Standing balance support: Bilateral upper extremity supported;During functional activity Standing balance-Leahy Scale: Poor Standing balance comment: anxietywith all standing activity         Cognition Arousal/Alertness: Awake/alert Behavior During Therapy: Anxious (ANXIOUS!!!) Overall Cognitive Status: Within Functional Limits for tasks assessed      General Comments: Self limiting, pain limiting, requires MAX cuing/encouragement for redirection             Pertinent Vitals/Pain Pain Assessment: No/denies pain Faces Pain Scale: No hurt Pain Location: R knee Pain Descriptors / Indicators: Crying;Discomfort Pain Intervention(s): Limited activity within patient's tolerance;Monitored during session;Premedicated before session;Repositioned           PT Goals (current goals can now be found in the care plan section) Acute Rehab PT Goals Patient Stated Goal: regain strength, return to home Progress towards PT goals: Progressing toward goals    Frequency    Min 2X/week      PT Plan Current plan remains appropriate       AM-PAC PT "6 Clicks" Mobility   Outcome Measure  Help needed turning from your back to your side while in a flat bed without using bedrails?: A Lot Help needed moving from lying on your back to sitting on the side of a flat bed without using bedrails?: A Lot Help needed moving to and from a bed to a chair (including a wheelchair)?: A Lot Help needed standing  up from a chair using your arms (e.g., wheelchair or bedside chair)?: A Lot Help needed to walk in hospital room?: A Lot Help needed climbing 3-5 steps with a railing? : A Lot 6 Click Score: 12    End of Session Equipment Utilized During Treatment: Gait belt Activity Tolerance: Patient  tolerated treatment well;Other (comment) (self limiting at time due to anxiety) Patient left: in chair;with call bell/phone within reach;with chair alarm set Nurse Communication: Mobility status PT Visit Diagnosis: Difficulty in walking, not elsewhere classified (R26.2);Muscle weakness (generalized) (M62.81)     Time: 1405-1430 PT Time Calculation (min) (ACUTE ONLY): 25 min  Charges:  $Therapeutic Activity: 8-22 mins                     Julaine Fusi PTA 10/26/20, 4:30 PM

## 2020-10-27 LAB — BASIC METABOLIC PANEL
Anion gap: 5 (ref 5–15)
BUN: 58 mg/dL — ABNORMAL HIGH (ref 8–23)
CO2: 20 mmol/L — ABNORMAL LOW (ref 22–32)
Calcium: 8.2 mg/dL — ABNORMAL LOW (ref 8.9–10.3)
Chloride: 102 mmol/L (ref 98–111)
Creatinine, Ser: 2.05 mg/dL — ABNORMAL HIGH (ref 0.44–1.00)
GFR, Estimated: 23 mL/min — ABNORMAL LOW (ref >60)
Glucose, Bld: 179 mg/dL — ABNORMAL HIGH (ref 70–99)
Potassium: 4.4 mmol/L (ref 3.5–5.1)
Sodium: 127 mmol/L — ABNORMAL LOW (ref 135–145)

## 2020-10-27 LAB — GLUCOSE, CAPILLARY
Glucose-Capillary: 165 mg/dL — ABNORMAL HIGH (ref 70–99)
Glucose-Capillary: 149 mg/dL — ABNORMAL HIGH (ref 70–99)
Glucose-Capillary: 202 mg/dL — ABNORMAL HIGH (ref 70–99)
Glucose-Capillary: 113 mg/dL — ABNORMAL HIGH (ref 70–99)

## 2020-10-27 LAB — CBC
HCT: 26.4 % — ABNORMAL LOW (ref 36.0–46.0)
Hemoglobin: 8.9 g/dL — ABNORMAL LOW (ref 12.0–15.0)
MCH: 30.6 pg (ref 26.0–34.0)
MCHC: 33.7 g/dL (ref 30.0–36.0)
MCV: 90.7 fL (ref 80.0–100.0)
Platelets: 273 10*3/uL (ref 150–400)
RBC: 2.91 MIL/uL — ABNORMAL LOW (ref 3.87–5.11)
RDW: 14.7 % (ref 11.5–15.5)
WBC: 6.7 10*3/uL (ref 4.0–10.5)
nRBC: 0 % (ref 0.0–0.2)

## 2020-10-27 LAB — MAGNESIUM: Magnesium: 2.1 mg/dL (ref 1.7–2.4)

## 2020-10-27 MED ORDER — INSULIN ASPART 100 UNIT/ML ~~LOC~~ SOLN: 3.0000 [IU] | Freq: Three times a day (TID) | SUBCUTANEOUS | 11 refills | Status: DC

## 2020-10-27 MED ORDER — LIDOCAINE 5 % EX PTCH: 3.0000 | MEDICATED_PATCH | CUTANEOUS | 0 refills | Status: DC

## 2020-10-27 MED ORDER — CYANOCOBALAMIN 1000 MCG PO TABS: 1000.0000 ug | ORAL_TABLET | Freq: Every day | ORAL | Status: DC

## 2020-10-27 MED ORDER — LISINOPRIL 20 MG PO TABS: ORAL_TABLET | ORAL | Status: DC

## 2020-10-27 MED ORDER — SODIUM CHLORIDE 0.9 % IV SOLN: INTRAVENOUS | Status: AC

## 2020-10-27 MED ORDER — POLYETHYLENE GLYCOL 3350 17 G PO PACK
34.0000 g | PACK | Freq: Two times a day (BID) | ORAL | Status: DC
  Administered 2020-10-27: 17 g via ORAL
  Filled 2020-10-27 (×2): qty 2

## 2020-10-27 MED ORDER — BUMETANIDE 2 MG PO TABS: ORAL_TABLET | ORAL | Status: DC

## 2020-10-27 MED ORDER — PANTOPRAZOLE SODIUM 40 MG PO TBEC: 40.0000 mg | DELAYED_RELEASE_TABLET | Freq: Two times a day (BID) | ORAL | Status: DC

## 2020-10-27 MED ORDER — POLYETHYLENE GLYCOL 3350 17 G PO PACK: 17.0000 g | PACK | Freq: Every day | ORAL | 0 refills | Status: DC

## 2020-10-27 MED ORDER — SUCRALFATE 1 GM/10ML PO SUSP: 1.0000 g | Freq: Three times a day (TID) | ORAL | 0 refills | Status: DC

## 2020-10-27 MED ORDER — METOPROLOL SUCCINATE ER 50 MG PO TB24: 50.0000 mg | ORAL_TABLET | Freq: Every day | ORAL | Status: DC

## 2020-10-27 MED ORDER — INSULIN GLARGINE 100 UNIT/ML ~~LOC~~ SOLN: 13.0000 [IU] | Freq: Every day | SUBCUTANEOUS | 11 refills | Status: DC

## 2020-10-27 NOTE — Progress Notes (Signed)
PROGRESS NOTE    Regina [name]  PJK:[id number] DOB: Apr 29, 1934 DOA: 10/17/2020 PCP: Donnie Coffin, MD   Brief Narrative: Taken from H&P. Regina [name] is a 87 y.o. female with medical history significant for GERD, chronic low back pain, sick sinus syndrome status post pacemaker, hypothyroid, insulin-dependent diabetes mellitus, CAD, presented to the emergency department for chief concerns of weakness and dizziness.  She was having intermittent dark-colored stools for some time, unable to specify the duration.  Had couple of them back to back yesterday.  Patient was on aspirin and Plavix. Per patient she did had 2 loose black-colored bowel movements overnight but no documentation. Admitted for symptomatic anemia secondary to upper GI bleed with hemoglobin of 7.1, received 1 unit of PRBC with improvement in hemoglobin to 8.8, trending down to 7.8 this morning.  Hemoglobin at 9.5 after second unit.  GI was consulted-EGD on 10/20/2020  positive for a small esophageal ulcer with stigmata of bleeding.  PT recommended SNF placement-waiting for bed availability and insurance authorization, she wants to go to [facility].  Subjective: Pt complained about stool being hard and pain in her rectum.  Pt reported eating drinking as much as she could.   Assessment & Plan:   Principal Problem:   Acute upper GI bleeding Active Problems:   Type 2 diabetes mellitus (HCC)   Essential hypertension   HLD (hyperlipidemia)   Paroxysmal atrial fibrillation (HCC)   GERD (gastroesophageal reflux disease)   Pacemaker   History of cancer of left breast   Sick sinus syndrome due to SA node dysfunction (HCC)  Symptomatic anemia secondary to upper GI bleed.  Hemoglobin continued to improve, currently at 9.9.  Received 2 units of PRBCs.  Anemia panel consistent with anemia of chronic disease.  EGD with a bleeding small esophageal ulcer.  Melena resolved. -Monitor hemoglobin -Transfuse if below 8. --resume ASA  and d/c plavix at discharge (CAD s/p PCI in 2009 as mentioned in past medical history, no other documentation, could not find another reason to stay on DAPT) -PT/OT recommending SNF placement.  AKI, not POA --Cr 2.37, up from 1.77 on 3/10.   Plan: --repeat LR@100  for 10 hours  Hyponatremia, not POA --Na 128 this morning, down from 136 on 3/10. Plan: --repeat LR@100  for 10 hours  Hypertension.   Blood pressure low normal today --hold home bumex and Lisinopril --cont Toprol 50 mg daily   Paroxysmal atrial fibrillation.  Patient has an history of prior GI bleed per cardiology note but could not find any documentation.  Not a candidate for anticoagulation.  Heart rate improved after restarting home dose of metoprolol. --cont Toprol 50 mg daily  Sick sinus syndrome S/p pacemaker.   No acute concern. -She will continue outpatient follow-up for pacemaker interrogation with cardiology.  Hyperlipidemia. -Continue home dose of statin.  Insulin-dependent diabetes mellitus.  A1c of 5.4.  CBG within goal now after adding Lantus.  Patient was on 70/30 at home. --cont Lantus 13u daily --cont mealtime 3u TID -Continue with SSI  Hypothyroidism. -Continue home dose of Synthroid  Stage 3 obesity. Body mass index is 45.91 kg/m.    Objective: Vitals:   10/27/20 0508 10/27/20 0740 10/27/20 1150 10/27/20 1610  BP: (!) 116/39 (!) 124/50 (!) 110/49 (!) 95/39  Pulse: 61 60 (!) 59 (!) 59  Resp: 16 15 16 15   Temp: 98.2 F (36.8 C) 98.4 F (36.9 C) 98.5 F (36.9 C) 97.8 F (36.6 C)  TempSrc:  SpO2: 100% 96% 100% 98%  Weight:      Height:        Intake/Output Summary (Last 24 hours) at 10/27/2020 1657 Last data filed at 10/27/2020 0900 Gross per 24 hour  Intake 240 ml  Output -  Net 240 ml   Filed Weights   10/20/20 0500 10/21/20 0624 10/22/20 0555  Weight: 95.8 kg 96.8 kg 103.1 kg    Examination:  Constitutional: NAD, AAOx3 HEENT: conjunctivae and lids normal, EOMI CV:  No cyanosis.   RESP: normal respiratory effort, on RA Extremities: 1+ pitting edema in BLE SKIN: warm, dry Neuro: II - XII grossly intact.   Psych: Normal mood and affect.  Appropriate judgement and reason   DVT prophylaxis: SCDs Code Status: Full Family Communication:  Disposition Plan:  Status is: Inpatient   Dispo: The patient is from: Home              Anticipated d/c is to: SNF              Patient currently is NOT medically stable.  New AKI on IVF.    Difficult to place patient No               Level of care: Med-Surg  All the records are reviewed and case discussed with Care Management/Social Worker. Management plans discussed with the patient, nursing and they are in agreement.  Consultants:   GI  Procedures:  Antimicrobials:   Data Reviewed: I have personally reviewed following labs and imaging studies  CBC: Recent Labs  Lab 10/21/20 0459 10/22/20 0611 10/24/20 0650 10/26/20 0650 10/27/20 0516  WBC 6.9 7.5 8.4 10.0 6.7  HGB 9.5* 10.5* 9.9* 9.0* 8.9*  HCT 29.1* 31.1* 30.4* 27.0* 26.4*  MCV 91.8 91.2 92.1 92.2 90.7  PLT 183 215 211 273 993   Basic Metabolic Panel: Recent Labs  Lab 10/26/20 0650 10/27/20 0516  NA 128* 127*  K 4.9 4.4  CL 99 102  CO2 22 20*  GLUCOSE 215* 179*  BUN 56* 58*  CREATININE 2.37* 2.05*  CALCIUM 8.2* 8.2*  MG 2.2 2.1   GFR: Estimated Creatinine Clearance: 21.3 mL/min (A) (by C-G formula based on SCr of 2.05 mg/dL (H)). Liver Function Tests: No results for input(s): AST, ALT, ALKPHOS, BILITOT, PROT, ALBUMIN in the last 168 hours. No results for input(s): LIPASE, AMYLASE in the last 168 hours. No results for input(s): AMMONIA in the last 168 hours. Coagulation Profile: No results for input(s): INR, PROTIME in the last 168 hours. Cardiac Enzymes: No results for input(s): CKTOTAL, CKMB, CKMBINDEX, TROPONINI in the last 168 hours. BNP (last 3 results) No results for input(s): PROBNP in the last 8760 hours. HbA1C: No  results for input(s): HGBA1C in the last 72 hours. CBG: Recent Labs  Lab 10/26/20 1654 10/26/20 2032 10/27/20 0749 10/27/20 1200 10/27/20 1638  GLUCAP 206* 175* 165* 149* 202*   Lipid Profile: No results for input(s): CHOL, HDL, LDLCALC, TRIG, CHOLHDL, LDLDIRECT in the last 72 hours. Thyroid Function Tests: No results for input(s): TSH, T4TOTAL, FREET4, T3FREE, THYROIDAB in the last 72 hours. Anemia Panel: No results for input(s): VITAMINB12, FOLATE, FERRITIN, TIBC, IRON, RETICCTPCT in the last 72 hours. Sepsis Labs: No results for input(s): PROCALCITON, LATICACIDVEN in the last 168 hours.  Recent Results (from the past 240 hour(s))  SARS CORONAVIRUS 2 (TAT 6-24 HRS) Nasopharyngeal Nasopharyngeal Swab     Status: None   Collection Time: 10/25/20 12:15 PM   Specimen: Nasopharyngeal Swab  Result  Value Ref Range Status   SARS Coronavirus 2 NEGATIVE NEGATIVE Final    Comment: (NOTE) SARS-CoV-2 target nucleic acids are NOT DETECTED.  The SARS-CoV-2 RNA is generally detectable in upper and lower respiratory specimens during the acute phase of infection. Negative results do not preclude SARS-CoV-2 infection, do not rule out co-infections with other pathogens, and should not be used as the sole basis for treatment or other patient management decisions. Negative results must be combined with clinical observations, patient history, and epidemiological information. The expected result is Negative.  Fact Sheet for Patients: SugarRoll.be  Fact Sheet for Healthcare Providers: https://www.woods-mathews.com/  This test is not yet approved or cleared by the [place] FDA and  has been authorized for detection and/or diagnosis of SARS-CoV-2 by FDA under an Emergency Use Authorization (EUA). This EUA will remain  in effect (meaning this test can be used) for the duration of the COVID-19 declaration under Se ction 564(b)(1) of the Act, 21  U.S.C. section 360bbb-3(b)(1), unless the authorization is terminated or revoked sooner.  Performed at [facility], [place] [address] [place], [place] [address]      Radiology Studies: No results found.  Scheduled Meds: . insulin aspart  0-15 Units Subcutaneous TID WC  . insulin aspart  3 Units Subcutaneous TID WC  . insulin glargine  13 Units Subcutaneous QHS  . levothyroxine  75 mcg Oral Daily  . lidocaine  3 patch Transdermal Q24H  . metoprolol succinate  50 mg Oral Q breakfast  . pantoprazole  40 mg Oral BID AC  . polyethylene glycol  34 g Oral BID  . rosuvastatin  10 mg Oral Daily  . sucralfate  1 g Oral TID WC & HS  . vitamin B-12  1,000 mcg Oral Daily   Continuous Infusions: . sodium chloride       LOS: 9 days    Enzo Bi, MD [facility]  If 7PM-7AM, please contact night-coverage [email or web address]  10/27/2020, 4:57 PM

## 2020-10-27 NOTE — Discharge Summary (Addendum)
Physician Discharge Summary   Regina [name]  female DOB: 1934/02/16  OVF:[id number]  PCP: Donnie Coffin, MD  Admit date: 10/17/2020 Discharge date: 10/28/2020  Admitted From: home Disposition:  SNF CODE STATUS: Full code    30 [place] Unplanned Readmission Risk Score   Flowsheet Row ED to Hosp-Admission (Current) from 10/17/2020 in [place] (1A)  30 [place] Unplanned Readmission Risk Score (%) 15.74 Filed at 10/27/2020 1200     This score is the patient's risk of an unplanned readmission within 30 days of being discharged (0 -100%). The score is based on dignosis, age, lab data, medications, orders, and past utilization.   Low:  0-14.9   Medium: 15-21.9   High: 22-29.9   Extreme: 30 and above         Hospital Course:  For full details, please see H&P, progress notes, consult notes and ancillary notes.  Briefly,  Regina [name] a 87 y.o.femalewith medical history significant forGERD, chronic low back pain, sick sinus syndrome status post pacemaker, hypothyroid, insulin-dependent diabetes mellitus, CAD, presented to the emergency department for chief concerns of weakness and dizziness.  She was having intermittent dark-colored stools for some time, unable to specify the duration.  Had couple of them back to back the [place] prior to presentation.  Patient was on aspirin and Plavix.  Symptomatic anemia secondary to upper GI bleed Received 2 units of PRBCs.  EGD found a bleeding small esophageal ulcer.  Melena resolved.  Pt was started on PPI BID.  Pt was ordered sucralfate which can continue for 10 more days after discharge.  ASA resumed at discharge.  Plavix d/c'ed.  Hgb stable around 8-9 prior to discharge.  Chronic anemia 2/2 chronic disease and vit B12 def Pt started on oral vit B12 supplement.  AKI, not POA CKD 3b Cr peaked at 2.37, up from 1.77 on 3/10.  Likely from poor hydration.  Pt received MIVF with improvement in Cr, which was 1.74 on the  [place] of discharge.  Hyponatremia, not POA Na lowest at 127.  Likely due to volume depletion from poor oral hydration.  Pt received MIVF with improvement to 131 on the [place] of discharge.    Hypertension, not currently active. Blood pressure has been low during hospitalization.  Home Bumex and lisinopril held.   --cont Toprol 50 mg daily   Paroxysmal atrial fibrillation.   Not a candidate for anticoagulation due to hx of and current GI bleed.   --cont Toprol 50 mg daily   Sick sinus syndrome S/p pacemaker.   No acute concern. She will continue outpatient follow-up for pacemaker interrogation with cardiology.  Hyperlipidemia. -Continue home dose of statin.  Insulin-dependent diabetes mellitus.   A1c of 5.4.  Pt received Lantus to 13u daily, mealtime 3u TID and SSI while inpatient.  Hypothyroidism. -Continue home dose of Synthroid  Stage 3 obesity. Body mass index is 45.91 kg/m.   CAD s/p PCI in 2009 Home ASA resumed, and home plavix d/c'ed at discharge.   Discharge Diagnoses:  Principal Problem:   Acute upper GI bleeding Active Problems:   Type 2 diabetes mellitus (HCC)   Essential hypertension   HLD (hyperlipidemia)   Paroxysmal atrial fibrillation (HCC)   GERD (gastroesophageal reflux disease)   Pacemaker   History of cancer of left breast   Sick sinus syndrome due to SA node dysfunction [facility])    Discharge Instructions:  Allergies as of 10/27/2020   No Known Allergies  Medication List    STOP taking these medications   clopidogrel 75 MG tablet Commonly known as: PLAVIX   insulin aspart protamine- aspart (70-30) 100 UNIT/ML injection Commonly known as: NOVOLOG MIX 70/30   oxyCODONE-acetaminophen 5-325 MG tablet Commonly known as: PERCOCET/ROXICET   ranolazine 500 MG 12 hr tablet Commonly known as: RANEXA     TAKE these medications   acetaminophen 500 MG tablet Commonly known as: TYLENOL Take 500 mg by mouth every 6 (six) hours as needed  for mild pain.   aspirin EC 81 MG tablet Take 81 mg by mouth daily.   bumetanide 2 MG tablet Commonly known as: [place] until followup with outpatient doctor due to low blood pressure and acute kidney injury. What changed:   how much to take  how to take this  when to take this  additional instructions   cyanocobalamin 1000 MCG tablet Take 1 tablet (1,000 mcg total) by mouth daily. Start taking on: October 28, 2020   fluticasone 50 MCG/ACT nasal spray Commonly known as: FLONASE Place 2 sprays into both nostrils daily as needed for allergies.   insulin aspart 100 UNIT/ML injection Commonly known as: novoLOG Inject 3 Units into the skin 3 (three) times daily with meals.   insulin glargine 100 UNIT/ML injection Commonly known as: LANTUS Inject 0.13 mLs (13 Units total) into the skin at bedtime.   levothyroxine 75 MCG tablet Commonly known as: SYNTHROID Take 75 mcg by mouth daily.   lidocaine 5 % Commonly known as: LIDODERM Place 3 patches onto the skin daily.   lisinopril 20 MG tablet Commonly known as: ZESTRIL Hold until followup with outpatient doctor due to low blood pressure and acute kidney injury. What changed:   how much to take  how to take this  when to take this  additional instructions   metoprolol succinate 50 MG 24 hr tablet Commonly known as: TOPROL-XL Take 1 tablet (50 mg total) by mouth daily. Take with or immediately following a meal. What changed: when to take this   pantoprazole 40 MG tablet Commonly known as: PROTONIX Take 1 tablet (40 mg total) by mouth 2 (two) times daily before a meal.   paricalcitol 1 MCG capsule Commonly known as: ZEMPLAR Take 1 mcg by mouth every Monday, Wednesday, and Friday.   polyethylene glycol 17 g packet Commonly known as: MIRALAX / GLYCOLAX Take 17 g by mouth daily. Start taking on: October 28, 2020   rosuvastatin 10 MG tablet Commonly known as: CRESTOR Take 10 mg by mouth daily.   sucralfate 1  GM/10ML suspension Commonly known as: CARAFATE Take 10 mLs (1 g total) by mouth 4 (four) times daily -  with meals and at bedtime for 10 days.        Contact information for after-discharge care    [facility] .   Service: Skilled Nursing Contact information: [place] [phone]                  No Known Allergies   The results of significant diagnostics from this hospitalization (including imaging, microbiology, ancillary and laboratory) are listed below for reference.   Consultations:   Procedures/Studies: DG Chest 2 View  Result Date: 10/17/2020 CLINICAL DATA:  Weakness. EXAM: CHEST - 2 VIEW COMPARISON:  04/18/2019. FINDINGS: Cardiac pacer stable position. Heart size normal. Questionable nodular density left upper lung. This could be overlying  the patient. PA lateral chest x-ray suggested further evaluation. No focal infiltrate. No pleural effusion or pneumothorax. No acute bony abnormality. Degenerative change thoracic spine. IMPRESSION: 1. Questionable nodular density left upper lung. This could be overlying the patient. PA and lateral chest x-ray suggested further evaluation. 2. Cardiac pacer stable position. Heart size normal. No acute pulmonary disease. Electronically Signed   By: Marcello Moores  Register   On: 10/17/2020 13:38   DG Knee Complete 4 Views Right  Result Date: 10/23/2020 CLINICAL DATA:  Right knee pain. EXAM: RIGHT KNEE - COMPLETE 4+ VIEW COMPARISON:  CT scan from 2017. FINDINGS: Tricompartmental degenerative changes most significant in the medial compartment with joint space narrowing and spurring. Irregularity of the medial femoral condyle articular surface is stable since the CT scan of 2017. No acute bony findings. No definite joint effusion. Advanced vascular calcifications. IMPRESSION: Tricompartmental degenerative changes  most significant in the medial compartment. No acute bony findings or joint effusion. Electronically Signed   By: Marijo Sanes M.D.   On: 10/23/2020 16:20      Labs: BNP (last 3 results) No results for input(s): BNP in the last 8760 hours. Basic Metabolic Panel: Recent Labs  Lab 10/26/20 0650 10/27/20 0516  NA 128* 127*  K 4.9 4.4  CL 99 102  CO2 22 20*  GLUCOSE 215* 179*  BUN 56* 58*  CREATININE 2.37* 2.05*  CALCIUM 8.2* 8.2*  MG 2.2 2.1   Liver Function Tests: No results for input(s): AST, ALT, ALKPHOS, BILITOT, PROT, ALBUMIN in the last 168 hours. No results for input(s): LIPASE, AMYLASE in the last 168 hours. No results for input(s): AMMONIA in the last 168 hours. CBC: Recent Labs  Lab 10/21/20 0459 10/22/20 0611 10/24/20 0650 10/26/20 0650 10/27/20 0516  WBC 6.9 7.5 8.4 10.0 6.7  HGB 9.5* 10.5* 9.9* 9.0* 8.9*  HCT 29.1* 31.1* 30.4* 27.0* 26.4*  MCV 91.8 91.2 92.1 92.2 90.7  PLT 183 215 211 273 273   Cardiac Enzymes: No results for input(s): CKTOTAL, CKMB, CKMBINDEX, TROPONINI in the last 168 hours. BNP: Invalid input(s): POCBNP CBG: Recent Labs  Lab 10/26/20 1305 10/26/20 1654 10/26/20 2032 10/27/20 0749 10/27/20 1200  GLUCAP 195* 206* 175* 165* 149*   D-Dimer No results for input(s): DDIMER in the last 72 hours. Hgb A1c No results for input(s): HGBA1C in the last 72 hours. Lipid Profile No results for input(s): CHOL, HDL, LDLCALC, TRIG, CHOLHDL, LDLDIRECT in the last 72 hours. Thyroid function studies No results for input(s): TSH, T4TOTAL, T3FREE, THYROIDAB in the last 72 hours.  Invalid input(s): FREET3 Anemia work up No results for input(s): VITAMINB12, FOLATE, FERRITIN, TIBC, IRON, RETICCTPCT in the last 72 hours. Urinalysis    Component Value Date/Time   COLORURINE AMBER (A) 08/11/2019 1201   APPEARANCEUR Cloudy (A) 10/25/2019 1533   LABSPEC 1.018 08/11/2019 1201   LABSPEC 1.008 07/03/2012 1616   PHURINE 6.0 08/11/2019 1201    GLUCOSEU Negative 10/25/2019 1533   GLUCOSEU Negative 07/03/2012 1616   HGBUR NEGATIVE 08/11/2019 1201   BILIRUBINUR Negative 10/25/2019 1533   BILIRUBINUR Negative 07/03/2012 [place] 08/11/2019 1201   PROTEINUR 2+ (A) 10/25/2019 1533   PROTEINUR 100 (A) 08/11/2019 1201   NITRITE Negative 10/25/2019 1533   NITRITE POSITIVE (A) 08/11/2019 1201   LEUKOCYTESUR 2+ (A) 10/25/2019 1533   LEUKOCYTESUR LARGE (A) 08/11/2019 1201   LEUKOCYTESUR Trace 07/03/2012 1616   Sepsis Labs Invalid input(s): PROCALCITONIN,  WBC,  LACTICIDVEN Microbiology Recent Results (from the past 240  hour(s))  SARS CORONAVIRUS 2 (TAT 6-24 HRS) Nasopharyngeal Nasopharyngeal Swab     Status: None   Collection Time: 10/17/20  2:09 PM   Specimen: Nasopharyngeal Swab  Result Value Ref Range Status   SARS Coronavirus 2 NEGATIVE NEGATIVE Final    Comment: (NOTE) SARS-CoV-2 target nucleic acids are NOT DETECTED.  The SARS-CoV-2 RNA is generally detectable in upper and lower respiratory specimens during the acute phase of infection. Negative results do not preclude SARS-CoV-2 infection, do not rule out co-infections with other pathogens, and should not be used as the sole basis for treatment or other patient management decisions. Negative results must be combined with clinical observations, patient history, and epidemiological information. The expected result is Negative.  Fact Sheet for Patients: SugarRoll.be  Fact Sheet for Healthcare Providers: https://www.woods-mathews.com/  This test is not yet approved or cleared by the [place] FDA and  has been authorized for detection and/or diagnosis of SARS-CoV-2 by FDA under an Emergency Use Authorization (EUA). This EUA will remain  in effect (meaning this test can be used) for the duration of the COVID-19 declaration under Se ction 564(b)(1) of the Act, 21 U.S.C. section 360bbb-3(b)(1), unless the  authorization is terminated or revoked sooner.  Performed at [facility], [place] [address], [place], [place] [address]   SARS CORONAVIRUS 2 (TAT 6-24 HRS) Nasopharyngeal Nasopharyngeal Swab     Status: None   Collection Time: 10/25/20 12:15 PM   Specimen: Nasopharyngeal Swab  Result Value Ref Range Status   SARS Coronavirus 2 NEGATIVE NEGATIVE Final    Comment: (NOTE) SARS-CoV-2 target nucleic acids are NOT DETECTED.  The SARS-CoV-2 RNA is generally detectable in upper and lower respiratory specimens during the acute phase of infection. Negative results do not preclude SARS-CoV-2 infection, do not rule out co-infections with other pathogens, and should not be used as the sole basis for treatment or other patient management decisions. Negative results must be combined with clinical observations, patient history, and epidemiological information. The expected result is Negative.  Fact Sheet for Patients: SugarRoll.be  Fact Sheet for Healthcare Providers: https://www.woods-mathews.com/  This test is not yet approved or cleared by the [place] FDA and  has been authorized for detection and/or diagnosis of SARS-CoV-2 by FDA under an Emergency Use Authorization (EUA). This EUA will remain  in effect (meaning this test can be used) for the duration of the COVID-19 declaration under Se ction 564(b)(1) of the Act, 21 U.S.C. section 360bbb-3(b)(1), unless the authorization is terminated or revoked sooner.  Performed at [facility], [place] [address], [place], [place] [address]      Total time spend on discharging this patient, including the last patient exam, discussing the hospital stay, instructions for ongoing care as it relates to all pertinent caregivers, as well as preparing the medical discharge records, prescriptions, and/or referrals as applicable, is 30 minutes.    Enzo Bi, MD  [facility] 10/27/2020, 2:07  PM

## 2020-10-27 NOTE — TOC Progression Note (Addendum)
Transition of Care [facility]) - Progression Note    Patient Details  Name: Regina [name] MRN: [id number] Date of Birth: [birth year]  [facility]) CM/SW [place], LCSW Phone Number: 10/27/2020, 9:55 AM  Clinical Narrative:   Informed by MD patient is not medically ready today. Updated Magda Paganini at [facility]. Asked Magda Paganini about potential weekend admission, waiting to hear back.  10:45- Per [facility] can accept patient Saturday or Sunday if medically ready but would need DC Summary and Orders in today before 3 pm. Updated MD and RN.   Expected Discharge Plan: [place] Barriers to Discharge: Continued Medical Work up,Insurance Authorization  Expected Discharge Plan and Services Expected Discharge Plan: [place] Choice: [place] arrangements for the past 2 months: Single Family Home                                       Social Determinants of Health (SDOH) Interventions    Readmission Risk Interventions No flowsheet data found.

## 2020-10-27 NOTE — Discharge Summary (Incomplete Revision)
Physician Discharge Summary   Regina [name]  female DOB: 04/08/1934  OIZ:[id number]  PCP: Donnie Coffin, MD  Admit date: 10/17/2020 Discharge date: 10/28/2020  Admitted From: home Disposition:  SNF CODE STATUS: Full code    30 Hass Unplanned Readmission Risk Score   Flowsheet Row ED to Hosp-Admission (Current) from 10/17/2020 in [place] (1A)  30 [place] Unplanned Readmission Risk Score (%) 15.74 Filed at 10/27/2020 1200     This score is the patient's risk of an unplanned readmission within 30 days of being discharged (0 -100%). The score is based on dignosis, age, lab data, medications, orders, and past utilization.   Low:  0-14.9   Medium: 15-21.9   High: 22-29.9   Extreme: 30 and above         Hospital Course:  For full details, please see H&P, progress notes, consult notes and ancillary notes.  Briefly,  Regina [name] a 87 y.o.femalewith medical history significant forGERD, chronic low back pain, sick sinus syndrome status post pacemaker, hypothyroid, insulin-dependent diabetes mellitus, CAD, presented to the emergency department for chief concerns of weakness and dizziness.  She was having intermittent dark-colored stools for some time, unable to specify the duration.  Had couple of them back to back the Stencil prior to presentation.  Patient was on aspirin and Plavix.  Symptomatic anemia secondary to upper GI bleed Received 2 units of PRBCs.  EGD with a bleeding small esophageal ulcer.  Melena resolved.  Pt was started on PPI BID.  ASA resumed at discharge.  Plavix d/c'ed.  Hgb stable around 8-9 prior to discharge.  Chronic anemia 2/2 chronic disease and vit B12 def Pt started on oral vit B12 supplement.  AKI, not POA --Cr peaked at 2.37, up from 1.77 on 3/10.  May be due to poor hydration.  Pt received MIVF with improvement in Cr.  Hyponatremia, not POA Likely due to volume depletion from poor oral hydration.  Pt received  MIVF.  Hypertension, not currently active. Blood pressure has been low during hospitalization.  Home Bumex and lisinopril held.   --cont Toprol 50 mg daily   Paroxysmal atrial fibrillation.   Not a candidate for anticoagulation due to hx of and current GI bleed.   --cont Toprol 50 mg daily   Sick sinus syndrome S/p pacemaker.   No acute concern. She will continue outpatient follow-up for pacemaker interrogation with cardiology.  Hyperlipidemia. -Continue home dose of statin.  Insulin-dependent diabetes mellitus.   A1c of 5.4.  Pt received Lantus to 13u daily, mealtime 3u TID and SSI while inpatient.  Hypothyroidism. -Continue home dose of Synthroid  Stage 3 obesity. Body mass index is 45.91 kg/m.   CAD s/p PCI in 2009 Home ASA resumed, and home plavix d/c'ed at discharge.   Discharge Diagnoses:  Principal Problem:   Acute upper GI bleeding Active Problems:   Type 2 diabetes mellitus (HCC)   Essential hypertension   HLD (hyperlipidemia)   Paroxysmal atrial fibrillation (HCC)   GERD (gastroesophageal reflux disease)   Pacemaker   History of cancer of left breast   Sick sinus syndrome due to SA node dysfunction [facility])    Discharge Instructions:  Allergies as of 10/27/2020   No Known Allergies     Medication List    STOP taking these medications   clopidogrel 75 MG tablet Commonly known as: PLAVIX   insulin aspart protamine- aspart (70-30) 100 UNIT/ML injection Commonly known as: NOVOLOG MIX 70/30   oxyCODONE-acetaminophen  5-325 MG tablet Commonly known as: PERCOCET/ROXICET   ranolazine 500 MG 12 hr tablet Commonly known as: RANEXA     TAKE these medications   acetaminophen 500 MG tablet Commonly known as: TYLENOL Take 500 mg by mouth every 6 (six) hours as needed for mild pain.   aspirin EC 81 MG tablet Take 81 mg by mouth daily.   bumetanide 2 MG tablet Commonly known as: Isabela until followup with outpatient doctor due to low blood  pressure and acute kidney injury. What changed:   how much to take  how to take this  when to take this  additional instructions   cyanocobalamin 1000 MCG tablet Take 1 tablet (1,000 mcg total) by mouth daily. Start taking on: October 28, 2020   fluticasone 50 MCG/ACT nasal spray Commonly known as: FLONASE Place 2 sprays into both nostrils daily as needed for allergies.   insulin aspart 100 UNIT/ML injection Commonly known as: novoLOG Inject 3 Units into the skin 3 (three) times daily with meals.   insulin glargine 100 UNIT/ML injection Commonly known as: LANTUS Inject 0.13 mLs (13 Units total) into the skin at bedtime.   levothyroxine 75 MCG tablet Commonly known as: SYNTHROID Take 75 mcg by mouth daily.   lidocaine 5 % Commonly known as: LIDODERM Place 3 patches onto the skin daily.   lisinopril 20 MG tablet Commonly known as: ZESTRIL Hold until followup with outpatient doctor due to low blood pressure and acute kidney injury. What changed:   how much to take  how to take this  when to take this  additional instructions   metoprolol succinate 50 MG 24 hr tablet Commonly known as: TOPROL-XL Take 1 tablet (50 mg total) by mouth daily. Take with or immediately following a meal. What changed: when to take this   pantoprazole 40 MG tablet Commonly known as: PROTONIX Take 1 tablet (40 mg total) by mouth 2 (two) times daily before a meal.   paricalcitol 1 MCG capsule Commonly known as: ZEMPLAR Take 1 mcg by mouth every Monday, Wednesday, and Friday.   polyethylene glycol 17 g packet Commonly known as: MIRALAX / GLYCOLAX Take 17 g by mouth daily. Start taking on: October 28, 2020   rosuvastatin 10 MG tablet Commonly known as: CRESTOR Take 10 mg by mouth daily.   sucralfate 1 GM/10ML suspension Commonly known as: CARAFATE Take 10 mLs (1 g total) by mouth 4 (four) times daily -  with meals and at bedtime for 10 days.        Contact information for  after-discharge care    [facility] .   Service: Skilled Nursing Contact information: [address] [facility] [place] [phone]                  No Known Allergies   The results of significant diagnostics from this hospitalization (including imaging, microbiology, ancillary and laboratory) are listed below for reference.   Consultations:   Procedures/Studies: DG Chest 2 View  Result Date: 10/17/2020 CLINICAL DATA:  Weakness. EXAM: CHEST - 2 VIEW COMPARISON:  04/18/2019. FINDINGS: Cardiac pacer stable position. Heart size normal. Questionable nodular density left upper lung. This could be overlying the patient. PA lateral chest x-ray suggested further evaluation. No focal infiltrate. No pleural effusion or pneumothorax. No acute bony abnormality. Degenerative change thoracic spine. IMPRESSION: 1. Questionable nodular density left upper lung. This could be overlying the  patient. PA and lateral chest x-ray suggested further evaluation. 2. Cardiac pacer stable position. Heart size normal. No acute pulmonary disease. Electronically Signed   By: Marcello Moores  Register   On: 10/17/2020 13:38   DG Knee Complete 4 Views Right  Result Date: 10/23/2020 CLINICAL DATA:  Right knee pain. EXAM: RIGHT KNEE - COMPLETE 4+ VIEW COMPARISON:  CT scan from 2017. FINDINGS: Tricompartmental degenerative changes most significant in the medial compartment with joint space narrowing and spurring. Irregularity of the medial femoral condyle articular surface is stable since the CT scan of 2017. No acute bony findings. No definite joint effusion. Advanced vascular calcifications. IMPRESSION: Tricompartmental degenerative changes most significant in the medial compartment. No acute bony findings or joint effusion. Electronically Signed   By: Marijo Sanes M.D.   On: 10/23/2020 16:20      Labs: BNP (last  3 results) No results for input(s): BNP in the last 8760 hours. Basic Metabolic Panel: Recent Labs  Lab 10/26/20 0650 10/27/20 0516  NA 128* 127*  K 4.9 4.4  CL 99 102  CO2 22 20*  GLUCOSE 215* 179*  BUN 56* 58*  CREATININE 2.37* 2.05*  CALCIUM 8.2* 8.2*  MG 2.2 2.1   Liver Function Tests: No results for input(s): AST, ALT, ALKPHOS, BILITOT, PROT, ALBUMIN in the last 168 hours. No results for input(s): LIPASE, AMYLASE in the last 168 hours. No results for input(s): AMMONIA in the last 168 hours. CBC: Recent Labs  Lab 10/21/20 0459 10/22/20 0611 10/24/20 0650 10/26/20 0650 10/27/20 0516  WBC 6.9 7.5 8.4 10.0 6.7  HGB 9.5* 10.5* 9.9* 9.0* 8.9*  HCT 29.1* 31.1* 30.4* 27.0* 26.4*  MCV 91.8 91.2 92.1 92.2 90.7  PLT 183 215 211 273 273   Cardiac Enzymes: No results for input(s): CKTOTAL, CKMB, CKMBINDEX, TROPONINI in the last 168 hours. BNP: Invalid input(s): POCBNP CBG: Recent Labs  Lab 10/26/20 1305 10/26/20 1654 10/26/20 2032 10/27/20 0749 10/27/20 1200  GLUCAP 195* 206* 175* 165* 149*   D-Dimer No results for input(s): DDIMER in the last 72 hours. Hgb A1c No results for input(s): HGBA1C in the last 72 hours. Lipid Profile No results for input(s): CHOL, HDL, LDLCALC, TRIG, CHOLHDL, LDLDIRECT in the last 72 hours. Thyroid function studies No results for input(s): TSH, T4TOTAL, T3FREE, THYROIDAB in the last 72 hours.  Invalid input(s): FREET3 Anemia work up No results for input(s): VITAMINB12, FOLATE, FERRITIN, TIBC, IRON, RETICCTPCT in the last 72 hours. Urinalysis    Component Value Date/Time   COLORURINE AMBER (A) 08/11/2019 1201   APPEARANCEUR Cloudy (A) 10/25/2019 1533   LABSPEC 1.018 08/11/2019 1201   LABSPEC 1.008 07/03/2012 1616   PHURINE 6.0 08/11/2019 1201   GLUCOSEU Negative 10/25/2019 1533   GLUCOSEU Negative 07/03/2012 1616   HGBUR NEGATIVE 08/11/2019 1201   BILIRUBINUR Negative 10/25/2019 1533   BILIRUBINUR Negative 07/03/2012 [place] 08/11/2019 1201   PROTEINUR 2+ (A) 10/25/2019 1533   PROTEINUR 100 (A) 08/11/2019 1201   NITRITE Negative 10/25/2019 1533   NITRITE POSITIVE (A) 08/11/2019 1201   LEUKOCYTESUR 2+ (A) 10/25/2019 1533   LEUKOCYTESUR LARGE (A) 08/11/2019 1201   LEUKOCYTESUR Trace 07/03/2012 1616   Sepsis Labs Invalid input(s): PROCALCITONIN,  WBC,  LACTICIDVEN Microbiology Recent Results (from the past 240 hour(s))  SARS CORONAVIRUS 2 (TAT 6-24 HRS) Nasopharyngeal Nasopharyngeal Swab     Status: None   Collection Time: 10/17/20  2:09 PM   Specimen: Nasopharyngeal Swab  Result Value Ref Range Status  SARS Coronavirus 2 NEGATIVE NEGATIVE Final    Comment: (NOTE) SARS-CoV-2 target nucleic acids are NOT DETECTED.  The SARS-CoV-2 RNA is generally detectable in upper and lower respiratory specimens during the acute phase of infection. Negative results do not preclude SARS-CoV-2 infection, do not rule out co-infections with other pathogens, and should not be used as the sole basis for treatment or other patient management decisions. Negative results must be combined with clinical observations, patient history, and epidemiological information. The expected result is Negative.  Fact Sheet for Patients: SugarRoll.be  Fact Sheet for Healthcare Providers: https://www.woods-mathews.com/  This test is not yet approved or cleared by the [place] FDA and  has been authorized for detection and/or diagnosis of SARS-CoV-2 by FDA under an Emergency Use Authorization (EUA). This EUA will remain  in effect (meaning this test can be used) for the duration of the COVID-19 declaration under Se ction 564(b)(1) of the Act, 21 U.S.C. section 360bbb-3(b)(1), unless the authorization is terminated or revoked sooner.  Performed at [facility], [place] [address] [place], [place] [address]   SARS CORONAVIRUS 2 (TAT 6-24 HRS) Nasopharyngeal  Nasopharyngeal Swab     Status: None   Collection Time: 10/25/20 12:15 PM   Specimen: Nasopharyngeal Swab  Result Value Ref Range Status   SARS Coronavirus 2 NEGATIVE NEGATIVE Final    Comment: (NOTE) SARS-CoV-2 target nucleic acids are NOT DETECTED.  The SARS-CoV-2 RNA is generally detectable in upper and lower respiratory specimens during the acute phase of infection. Negative results do not preclude SARS-CoV-2 infection, do not rule out co-infections with other pathogens, and should not be used as the sole basis for treatment or other patient management decisions. Negative results must be combined with clinical observations, patient history, and epidemiological information. The expected result is Negative.  Fact Sheet for Patients: SugarRoll.be  Fact Sheet for Healthcare Providers: https://www.woods-mathews.com/  This test is not yet approved or cleared by the [place] FDA and  has been authorized for detection and/or diagnosis of SARS-CoV-2 by FDA under an Emergency Use Authorization (EUA). This EUA will remain  in effect (meaning this test can be used) for the duration of the COVID-19 declaration under Se ction 564(b)(1) of the Act, 21 U.S.C. section 360bbb-3(b)(1), unless the authorization is terminated or revoked sooner.  Performed at [facility], [place] [address], [place], [place] [address]      Total time spend on discharging this patient, including the last patient exam, discussing the hospital stay, instructions for ongoing care as it relates to all pertinent caregivers, as well as preparing the medical discharge records, prescriptions, and/or referrals as applicable, is 40 minutes.    Enzo Bi, MD  [facility] 10/27/2020, 2:07 PM

## 2020-10-28 LAB — CBC
HCT: 26.9 % — ABNORMAL LOW (ref 36.0–46.0)
Hemoglobin: 8.9 g/dL — ABNORMAL LOW (ref 12.0–15.0)
MCH: 30.4 pg (ref 26.0–34.0)
MCHC: 33.1 g/dL (ref 30.0–36.0)
MCV: 91.8 fL (ref 80.0–100.0)
Platelets: 295 10*3/uL (ref 150–400)
RBC: 2.93 MIL/uL — ABNORMAL LOW (ref 3.87–5.11)
RDW: 14.6 % (ref 11.5–15.5)
WBC: 6.5 10*3/uL (ref 4.0–10.5)
nRBC: 0 % (ref 0.0–0.2)

## 2020-10-28 LAB — BASIC METABOLIC PANEL
Anion gap: 3 — ABNORMAL LOW (ref 5–15)
BUN: 57 mg/dL — ABNORMAL HIGH (ref 8–23)
CO2: 21 mmol/L — ABNORMAL LOW (ref 22–32)
Calcium: 8 mg/dL — ABNORMAL LOW (ref 8.9–10.3)
Chloride: 107 mmol/L (ref 98–111)
Creatinine, Ser: 1.74 mg/dL — ABNORMAL HIGH (ref 0.44–1.00)
GFR, Estimated: 28 mL/min — ABNORMAL LOW (ref >60)
Glucose, Bld: 147 mg/dL — ABNORMAL HIGH (ref 70–99)
Potassium: 4.5 mmol/L (ref 3.5–5.1)
Sodium: 131 mmol/L — ABNORMAL LOW (ref 135–145)

## 2020-10-28 LAB — MAGNESIUM: Magnesium: 2 mg/dL (ref 1.7–2.4)

## 2020-10-28 LAB — RESP PANEL BY RT-PCR (FLU A&B, COVID) ARPGX2
Influenza A by PCR: NEGATIVE
Influenza B by PCR: NEGATIVE
SARS Coronavirus 2 by RT PCR: NEGATIVE

## 2020-10-28 LAB — GLUCOSE, CAPILLARY
Glucose-Capillary: 132 mg/dL — ABNORMAL HIGH (ref 70–99)
Glucose-Capillary: 155 mg/dL — ABNORMAL HIGH (ref 70–99)
Glucose-Capillary: 142 mg/dL — ABNORMAL HIGH (ref 70–99)

## 2020-10-28 NOTE — TOC Transition Note (Addendum)
Transition of Care [facility]) - CM/SW Discharge Note   Patient Details  Name: Regina [name] MRN: [id number] Date of Birth: 1934-04-07  [facility] CM/SW Contact:  Magnus Ivan, LCSW Phone Number: 10/28/2020, 12:11 PM   Clinical Narrative:   Patient to discharge to [facility] today, Room 504. Confirmed with Magda Paganini at [facility]. CSW updated MD, RN, patient/family. Asked RN to call report and MD to submit DC Summary. Medical Necessity Form and Face Sheet placed in Discharge Packet by patient chart. [facility] transport arranged for 3:30 pick up. RN aware. No other needs identified prior to discharge.  4:05- Called [facility] since they have not picked patient up yet. Spoke to Ron who reported they are running behind but are en route to the hospital to pick patient up now.  Final next level of care: Skilled Nursing Facility Barriers to Discharge: Barriers Resolved   Patient Goals and CMS Choice Patient states their goals for this hospitalization and ongoing recovery are:: SNF rehab CMS Medicare.gov Compare Post Acute Care list provided to:: Patient Choice offered to / list presented to : Patient  Discharge Placement              Patient chooses bed at: [facility] Patient to be transferred to facility by: EMS Name of family member notified: patient aware. Patient and family notified of of transfer: 10/28/20  Discharge Plan and Services     Post Acute Care Choice: [place]                               Social Determinants of Health (SDOH) Interventions     Readmission Risk Interventions No flowsheet data found.

## 2020-10-28 NOTE — Progress Notes (Signed)
IV removed x2. Pt assisted with collecting belongings. Report was called at 11:30 today to [place] at [facility]. Pt in transfer gown. EMS transport.

## 2020-10-28 NOTE — Discharge Instructions (Signed)

## 2020-11-22 ENCOUNTER — Emergency Department: Payer: Medicare Other

## 2020-11-22 ENCOUNTER — Observation Stay: Payer: Medicare Other

## 2020-11-22 ENCOUNTER — Inpatient Hospital Stay
Admission: EM | Admit: 2020-11-22 | Discharge: 2020-11-30 | DRG: 641 | Disposition: A | Discharge status: Skilled Nursing Facility | Payer: Medicare Other | Attending: Internal Medicine | Admitting: Internal Medicine

## 2020-11-22 DIAGNOSIS — Z6841 Body Mass Index (BMI) 40.0 and over, adult: Secondary | ICD-10-CM

## 2020-11-22 DIAGNOSIS — E611 Iron deficiency: Secondary | ICD-10-CM | POA: Diagnosis present

## 2020-11-22 DIAGNOSIS — E1122 Type 2 diabetes mellitus with diabetic chronic kidney disease: Secondary | ICD-10-CM | POA: Diagnosis present

## 2020-11-22 DIAGNOSIS — R531 Weakness: Secondary | ICD-10-CM | POA: Diagnosis not present

## 2020-11-22 DIAGNOSIS — R600 Localized edema: Secondary | ICD-10-CM

## 2020-11-22 DIAGNOSIS — Z841 Family history of disorders of kidney and ureter: Secondary | ICD-10-CM

## 2020-11-22 DIAGNOSIS — E785 Hyperlipidemia, unspecified: Secondary | ICD-10-CM | POA: Diagnosis present

## 2020-11-22 DIAGNOSIS — Z9861 Coronary angioplasty status: Secondary | ICD-10-CM

## 2020-11-22 DIAGNOSIS — I959 Hypotension, unspecified: Secondary | ICD-10-CM | POA: Diagnosis present

## 2020-11-22 DIAGNOSIS — I480 Paroxysmal atrial fibrillation: Secondary | ICD-10-CM | POA: Diagnosis present

## 2020-11-22 DIAGNOSIS — I872 Venous insufficiency (chronic) (peripheral): Secondary | ICD-10-CM | POA: Diagnosis present

## 2020-11-22 DIAGNOSIS — Z20822 Contact with and (suspected) exposure to covid-19: Secondary | ICD-10-CM | POA: Diagnosis present

## 2020-11-22 DIAGNOSIS — E860 Dehydration: Secondary | ICD-10-CM | POA: Diagnosis not present

## 2020-11-22 DIAGNOSIS — M7122 Synovial cyst of popliteal space [Baker], left knee: Secondary | ICD-10-CM | POA: Diagnosis present

## 2020-11-22 DIAGNOSIS — K529 Noninfective gastroenteritis and colitis, unspecified: Secondary | ICD-10-CM | POA: Diagnosis present

## 2020-11-22 DIAGNOSIS — Z801 Family history of malignant neoplasm of trachea, bronchus and lung: Secondary | ICD-10-CM

## 2020-11-22 DIAGNOSIS — D631 Anemia in chronic kidney disease: Secondary | ICD-10-CM | POA: Diagnosis present

## 2020-11-22 DIAGNOSIS — Z9049 Acquired absence of other specified parts of digestive tract: Secondary | ICD-10-CM

## 2020-11-22 DIAGNOSIS — N1832 Chronic kidney disease, stage 3b: Secondary | ICD-10-CM | POA: Diagnosis present

## 2020-11-22 DIAGNOSIS — E162 Hypoglycemia, unspecified: Secondary | ICD-10-CM | POA: Diagnosis present

## 2020-11-22 DIAGNOSIS — E039 Hypothyroidism, unspecified: Secondary | ICD-10-CM | POA: Diagnosis present

## 2020-11-22 DIAGNOSIS — Z950 Presence of cardiac pacemaker: Secondary | ICD-10-CM

## 2020-11-22 DIAGNOSIS — N179 Acute kidney failure, unspecified: Secondary | ICD-10-CM | POA: Diagnosis present

## 2020-11-22 DIAGNOSIS — B961 Klebsiella pneumoniae [K. pneumoniae] as the cause of diseases classified elsewhere: Secondary | ICD-10-CM | POA: Diagnosis present

## 2020-11-22 DIAGNOSIS — Z79890 Hormone replacement therapy (postmenopausal): Secondary | ICD-10-CM

## 2020-11-22 DIAGNOSIS — G8929 Other chronic pain: Secondary | ICD-10-CM | POA: Diagnosis present

## 2020-11-22 DIAGNOSIS — Z9012 Acquired absence of left breast and nipple: Secondary | ICD-10-CM

## 2020-11-22 DIAGNOSIS — Z8719 Personal history of other diseases of the digestive system: Secondary | ICD-10-CM

## 2020-11-22 DIAGNOSIS — Z7982 Long term (current) use of aspirin: Secondary | ICD-10-CM

## 2020-11-22 DIAGNOSIS — I152 Hypertension secondary to endocrine disorders: Secondary | ICD-10-CM | POA: Diagnosis present

## 2020-11-22 DIAGNOSIS — E1165 Type 2 diabetes mellitus with hyperglycemia: Secondary | ICD-10-CM | POA: Diagnosis not present

## 2020-11-22 DIAGNOSIS — E1159 Type 2 diabetes mellitus with other circulatory complications: Secondary | ICD-10-CM | POA: Diagnosis present

## 2020-11-22 DIAGNOSIS — E1169 Type 2 diabetes mellitus with other specified complication: Secondary | ICD-10-CM | POA: Diagnosis present

## 2020-11-22 DIAGNOSIS — M545 Low back pain, unspecified: Secondary | ICD-10-CM | POA: Diagnosis present

## 2020-11-22 DIAGNOSIS — N183 Chronic kidney disease, stage 3 unspecified: Secondary | ICD-10-CM | POA: Diagnosis present

## 2020-11-22 DIAGNOSIS — Z79899 Other long term (current) drug therapy: Secondary | ICD-10-CM

## 2020-11-22 DIAGNOSIS — Z808 Family history of malignant neoplasm of other organs or systems: Secondary | ICD-10-CM

## 2020-11-22 DIAGNOSIS — I890 Lymphedema, not elsewhere classified: Secondary | ICD-10-CM | POA: Diagnosis present

## 2020-11-22 DIAGNOSIS — Z853 Personal history of malignant neoplasm of breast: Secondary | ICD-10-CM

## 2020-11-22 DIAGNOSIS — E11649 Type 2 diabetes mellitus with hypoglycemia without coma: Secondary | ICD-10-CM | POA: Diagnosis present

## 2020-11-22 DIAGNOSIS — Z794 Long term (current) use of insulin: Secondary | ICD-10-CM

## 2020-11-22 DIAGNOSIS — R7989 Other specified abnormal findings of blood chemistry: Secondary | ICD-10-CM | POA: Diagnosis present

## 2020-11-22 DIAGNOSIS — E876 Hypokalemia: Secondary | ICD-10-CM | POA: Diagnosis present

## 2020-11-22 DIAGNOSIS — I495 Sick sinus syndrome: Secondary | ICD-10-CM | POA: Diagnosis present

## 2020-11-22 DIAGNOSIS — E119 Type 2 diabetes mellitus without complications: Secondary | ICD-10-CM

## 2020-11-22 DIAGNOSIS — I2510 Atherosclerotic heart disease of native coronary artery without angina pectoris: Secondary | ICD-10-CM | POA: Diagnosis present

## 2020-11-22 DIAGNOSIS — Z66 Do not resuscitate: Secondary | ICD-10-CM | POA: Diagnosis present

## 2020-11-22 DIAGNOSIS — N390 Urinary tract infection, site not specified: Principal | ICD-10-CM | POA: Diagnosis present

## 2020-11-22 LAB — COMPREHENSIVE METABOLIC PANEL
ALT: 17 U/L (ref 0–44)
AST: 32 U/L (ref 15–41)
Albumin: 3.2 g/dL — ABNORMAL LOW (ref 3.5–5.0)
Alkaline Phosphatase: 80 U/L (ref 38–126)
Anion gap: 11 (ref 5–15)
BUN: 14 mg/dL (ref 8–23)
CO2: 26 mmol/L (ref 22–32)
Calcium: 8.7 mg/dL — ABNORMAL LOW (ref 8.9–10.3)
Chloride: 102 mmol/L (ref 98–111)
Creatinine, Ser: 1.1 mg/dL — ABNORMAL HIGH (ref 0.44–1.00)
GFR, Estimated: 49 mL/min — ABNORMAL LOW (ref >60)
Glucose, Bld: 53 mg/dL — ABNORMAL LOW (ref 70–99)
Potassium: 2.9 mmol/L — ABNORMAL LOW (ref 3.5–5.1)
Sodium: 139 mmol/L (ref 135–145)
Total Bilirubin: 0.5 mg/dL (ref 0.3–1.2)
Total Protein: 6.4 g/dL — ABNORMAL LOW (ref 6.5–8.1)

## 2020-11-22 LAB — CBC WITH DIFFERENTIAL/PLATELET
Abs Immature Granulocytes: 0.03 10*3/uL (ref 0.00–0.07)
Basophils Absolute: 0 10*3/uL (ref 0.0–0.1)
Basophils Relative: 0 %
Eosinophils Absolute: 0.2 10*3/uL (ref 0.0–0.5)
Eosinophils Relative: 2 %
HCT: 35 % — ABNORMAL LOW (ref 36.0–46.0)
Hemoglobin: 11.5 g/dL — ABNORMAL LOW (ref 12.0–15.0)
Immature Granulocytes: 0 %
Lymphocytes Relative: 16 %
Lymphs Abs: 1.2 10*3/uL (ref 0.7–4.0)
MCH: 29.7 pg (ref 26.0–34.0)
MCHC: 32.9 g/dL (ref 30.0–36.0)
MCV: 90.4 fL (ref 80.0–100.0)
Monocytes Absolute: 0.6 10*3/uL (ref 0.1–1.0)
Monocytes Relative: 8 %
Neutro Abs: 5.8 10*3/uL (ref 1.7–7.7)
Neutrophils Relative %: 74 %
Platelets: 272 10*3/uL (ref 150–400)
RBC: 3.87 MIL/uL (ref 3.87–5.11)
RDW: 14.4 % (ref 11.5–15.5)
WBC: 7.8 10*3/uL (ref 4.0–10.5)
nRBC: 0 % (ref 0.0–0.2)

## 2020-11-22 LAB — URINALYSIS, COMPLETE (UACMP) WITH MICROSCOPIC
Bilirubin Urine: NEGATIVE
Glucose, UA: 50 mg/dL — AB
Ketones, ur: NEGATIVE mg/dL
Nitrite: NEGATIVE
Protein, ur: NEGATIVE mg/dL
Specific Gravity, Urine: 1.009 (ref 1.005–1.030)
pH: 5 (ref 5.0–8.0)

## 2020-11-22 LAB — TROPONIN I (HIGH SENSITIVITY): Troponin I (High Sensitivity): 23 ng/L — ABNORMAL HIGH (ref <18)

## 2020-11-22 LAB — GASTROINTESTINAL PANEL BY PCR, STOOL (REPLACES STOOL CULTURE)

## 2020-11-22 LAB — APTT: aPTT: 32 seconds (ref 24–36)

## 2020-11-22 LAB — CBG MONITORING, ED
Glucose-Capillary: 62 mg/dL — ABNORMAL LOW (ref 70–99)
Glucose-Capillary: 96 mg/dL (ref 70–99)
Glucose-Capillary: 32 mg/dL — CL (ref 70–99)
Glucose-Capillary: 86 mg/dL (ref 70–99)

## 2020-11-22 LAB — RESP PANEL BY RT-PCR (FLU A&B, COVID) ARPGX2
Influenza A by PCR: NEGATIVE
Influenza B by PCR: NEGATIVE
SARS Coronavirus 2 by RT PCR: NEGATIVE

## 2020-11-22 LAB — BRAIN NATRIURETIC PEPTIDE: B Natriuretic Peptide: 275.1 pg/mL — ABNORMAL HIGH (ref 0.0–100.0)

## 2020-11-22 LAB — C DIFFICILE QUICK SCREEN W PCR REFLEX
C Diff antigen: NEGATIVE
C Diff interpretation: NOT DETECTED
C Diff toxin: NEGATIVE

## 2020-11-22 LAB — LACTIC ACID, PLASMA: Lactic Acid, Venous: 2.4 mmol/L (ref 0.5–1.9)

## 2020-11-22 LAB — PROTIME-INR
INR: 1.2 (ref 0.8–1.2)
Prothrombin Time: 14.9 seconds (ref 11.4–15.2)

## 2020-11-22 LAB — TSH: TSH: 2.618 u[IU]/mL (ref 0.350–4.500)

## 2020-11-22 LAB — MAGNESIUM: Magnesium: 1.6 mg/dL — ABNORMAL LOW (ref 1.7–2.4)

## 2020-11-22 LAB — LIPASE, BLOOD: Lipase: 21 U/L (ref 11–51)

## 2020-11-22 MED ORDER — ACETAMINOPHEN 650 MG RE SUPP: 650.0000 mg | Freq: Four times a day (QID) | RECTAL | Status: DC | PRN

## 2020-11-22 MED ORDER — PANTOPRAZOLE SODIUM 40 MG PO TBEC
40.0000 mg | DELAYED_RELEASE_TABLET | Freq: Two times a day (BID) | ORAL | Status: DC
  Administered 2020-11-23 – 2020-11-30 (×15): 40 mg via ORAL
  Filled 2020-11-22 (×15): qty 1

## 2020-11-22 MED ORDER — DEXTROSE 50 % IV SOLN
1.0000 | INTRAVENOUS | Status: DC | PRN
  Administered 2020-11-22: 50 mL via INTRAVENOUS

## 2020-11-22 MED ORDER — METOPROLOL SUCCINATE ER 50 MG PO TB24
50.0000 mg | ORAL_TABLET | Freq: Every day | ORAL | Status: DC
  Filled 2020-11-22: qty 1

## 2020-11-22 MED ORDER — ASPIRIN EC 81 MG PO TBEC
81.0000 mg | DELAYED_RELEASE_TABLET | Freq: Every day | ORAL | Status: DC
  Administered 2020-11-23 – 2020-11-30 (×8): 81 mg via ORAL
  Filled 2020-11-22 (×8): qty 1

## 2020-11-22 MED ORDER — KCL IN DEXTROSE-NACL 40-5-0.45 MEQ/L-%-% IV SOLN
INTRAVENOUS | Status: AC
  Filled 2020-11-22: qty 1000

## 2020-11-22 MED ORDER — MAGNESIUM SULFATE 2 GM/50ML IV SOLN
2.0000 g | Freq: Once | INTRAVENOUS | Status: AC
  Administered 2020-11-23: 2 g via INTRAVENOUS
  Filled 2020-11-22: qty 50

## 2020-11-22 MED ORDER — POTASSIUM CHLORIDE 20 MEQ PO PACK
40.0000 meq | PACK | Freq: Once | ORAL | Status: DC
  Filled 2020-11-22: qty 2

## 2020-11-22 MED ORDER — ROSUVASTATIN CALCIUM 10 MG PO TABS
10.0000 mg | ORAL_TABLET | Freq: Every day | ORAL | Status: DC
  Administered 2020-11-23 – 2020-11-30 (×8): 10 mg via ORAL
  Filled 2020-11-22 (×8): qty 1

## 2020-11-22 MED ORDER — DEXTROSE 10 % IV SOLN
250.0000 mL | Freq: Once | INTRAVENOUS | Status: AC
  Administered 2020-11-22: 250 mL via INTRAVENOUS

## 2020-11-22 MED ORDER — DEXTROSE 50 % IV SOLN: 1.0000 | Freq: Once | INTRAVENOUS | Status: DC

## 2020-11-22 MED ORDER — LEVOTHYROXINE SODIUM 75 MCG PO TABS
75.0000 ug | ORAL_TABLET | Freq: Every day | ORAL | Status: DC
  Administered 2020-11-23 – 2020-11-30 (×8): 75 ug via ORAL
  Filled 2020-11-22: qty 3
  Filled 2020-11-22: qty 1
  Filled 2020-11-22: qty 3
  Filled 2020-11-22: qty 1
  Filled 2020-11-22: qty 3
  Filled 2020-11-22 (×2): qty 1
  Filled 2020-11-22: qty 3
  Filled 2020-11-22: qty 1
  Filled 2020-11-22: qty 3
  Filled 2020-11-22 (×2): qty 1
  Filled 2020-11-22: qty 3
  Filled 2020-11-22: qty 1
  Filled 2020-11-22: qty 3
  Filled 2020-11-22: qty 1

## 2020-11-22 MED ORDER — SODIUM CHLORIDE 0.9 % IV SOLN
1.0000 g | Freq: Once | INTRAVENOUS | Status: AC
  Administered 2020-11-22: 1 g via INTRAVENOUS
  Filled 2020-11-22: qty 10

## 2020-11-22 MED ORDER — ACETAMINOPHEN 325 MG PO TABS
650.0000 mg | ORAL_TABLET | Freq: Four times a day (QID) | ORAL | Status: DC | PRN
  Administered 2020-11-22 – 2020-11-30 (×15): 650 mg via ORAL
  Filled 2020-11-22 (×16): qty 2

## 2020-11-22 MED ORDER — ONDANSETRON HCL 4 MG PO TABS: 4.0000 mg | ORAL_TABLET | Freq: Four times a day (QID) | ORAL | Status: DC | PRN

## 2020-11-22 MED ORDER — SODIUM CHLORIDE 0.9 % IV SOLN
1.0000 g | INTRAVENOUS | Status: DC
  Administered 2020-11-23 – 2020-11-24 (×2): 1 g via INTRAVENOUS
  Filled 2020-11-22: qty 1
  Filled 2020-11-22 (×2): qty 10

## 2020-11-22 MED ORDER — VITAMIN B-12 1000 MCG PO TABS
1000.0000 ug | ORAL_TABLET | Freq: Every day | ORAL | Status: DC
  Administered 2020-11-23 – 2020-11-30 (×8): 1000 ug via ORAL
  Filled 2020-11-22 (×8): qty 1

## 2020-11-22 MED ORDER — ONDANSETRON HCL 4 MG/2ML IJ SOLN: 4.0000 mg | Freq: Four times a day (QID) | INTRAMUSCULAR | Status: DC | PRN

## 2020-11-22 MED ORDER — SODIUM CHLORIDE 0.9% FLUSH
3.0000 mL | Freq: Two times a day (BID) | INTRAVENOUS | Status: DC
  Administered 2020-11-23 – 2020-11-30 (×12): 3 mL via INTRAVENOUS

## 2020-11-22 MED ORDER — DEXTROSE 50 % IV SOLN
INTRAVENOUS | Status: AC
  Administered 2020-11-22: 50 mL
  Filled 2020-11-22: qty 50

## 2020-11-22 MED ORDER — SODIUM CHLORIDE 0.9 % IV BOLUS
500.0000 mL | Freq: Once | INTRAVENOUS | Status: AC
  Administered 2020-11-22: 500 mL via INTRAVENOUS

## 2020-11-22 MED ORDER — LOPERAMIDE HCL 1 MG/7.5ML PO SUSP
2.0000 mg | ORAL | Status: DC | PRN
  Filled 2020-11-22: qty 15

## 2020-11-22 NOTE — ED Notes (Signed)
EDP [place] at bedside. 

## 2020-11-22 NOTE — ED Notes (Signed)
Pt had a loose BM in bed, pt cleaned up, new purewick placed, c-diff and GI panel obtained as well as UA.

## 2020-11-22 NOTE — ED Notes (Signed)
Pt presents to ED via EMS with c/o of diarrhea and weakness for 3 days. Pt states recently discharged from a rehab facility to home. Pt states caregiver is there about 2 hours a Shon. Pt arrives covered in diarrhea, soft but not watery. Pt cleaned up, brief applied as well as purewick and bed changed. Pt is A&Ox4. NAD noted. Pt is not febrile at this time but is tachycardiac at this time, BP stable. NAD noted. Pt states "pain all over".

## 2020-11-22 NOTE — ED Notes (Signed)
Pt is argumentative about where her IV is placed and how it is placed, MD aware, will attempt one more time at PIV then IV team consult placed per MD request

## 2020-11-22 NOTE — ED Notes (Signed)
RN made aware of pt CBG result of 32. RN to bedside. Pt provided with chocolate ice cream, vanilla ice cream, and two applesauce's.

## 2020-11-22 NOTE — H&P (Signed)
History and Physical    Regina [name] YSA:[id number] DOB: 1934/02/28 DOA: 11/22/2020  PCP: Donnie Coffin, MD  Patient coming from: Home via EMS  I have personally briefly reviewed patient's old medical records in [place]  Chief Complaint: Loose stools, generalized weakness  HPI: Regina [name] is a 87 y.o. female with medical history significant for CAD s/p PCI 2009, PAF not on anticoagulation due to history of GI bleed, SSS s/p PPM, CKD stage III, IDT2DM, hypothyroidism, hypertension, hyperlipidemia, and morbid obesity who presents to the ED for evaluation of generalized weakness and loose stools.  Patient was recently admitted from 10/17/2020-10/28/2020 for acute blood loss anemia due to upper GI bleed.  Patient received 2 unit PRBC transfusion.  She underwent EGD on 3/11 which showed an esophageal ulcer with recent bleeding stigmata that was treated with bipolar cautery.  Her home Plavix was stopped and she was continued on aspirin alone at time of discharge.  She was discharged to [facility].  Patient states she was just discharged from SNF to home where she lives alone.  She says she was doing well in the SNF but is having worsening generalized weakness.  She normally ambulates with the use of a walker.  She has also been having loose stools which she says has been ongoing for about 1 month.  She reports a recent urinary incontinence with dysuria.  She is having pain in her back and left arm.  She reports new swelling in both of her lower extremities.  She denies any nausea, vomiting, abdominal pain, chest pain, or dyspnea.  She denies any hematochezia or melena.  ED Course:  Initial vitals showed BP 118/54, pulse 103, RR 30, temp 98.1 F, SPO2 100% on room air.  Labs show sodium 139, potassium 2.9, bicarb 26, BUN 14, creatinine 1.10, serum glucose 53, albumin 3.2, AST 32, ALT 17, alk phos 80, total bilirubin 0.5, WBC 7.8, hemoglobin 11.5, platelets 272,000, BNP 275.1, lactic  acid 2.4, lipase 21, high-sensitivity troponin I 23, TSH 2.618.  Urinalysis shows negative nitrites, trace leukocytes, 0-5 RBCs and WBCs/hpf, many bacteria on microscopy.  Urine and blood cultures were obtained and pending.  C. difficile panel is negative.  GI pathogen panel is obtained and pending.  SARS-CoV-2 panel is ordered and pending.  Portable chest x-ray shows left-sided PPM in place without focal consolidation, edema, or effusion.  Left humerus x-rays unremarkable.  Left upper extremity DVT ultrasound is negative for evidence of DVT.  Patient was given IV D10 with repeat CBG improved to 96.  She was given 500 cc normal saline and IV ceftriaxone.  The hospitalist service was consulted to admit for further evaluation and management.  Review of Systems: All systems reviewed and are negative except as documented in history of present illness above.   Past Medical History:  Diagnosis Date  . Breast cancer, left [facility]) 2013   Mastectomy.   . CAD (coronary artery disease)    s/p cath in 2009 with 2 stents  . Diabetes mellitus without complication ([place])   . GERD (gastroesophageal reflux disease)   . HLD (hyperlipidemia)   . Hypertension   . Hypothyroidism   . Paroxysmal atrial fibrillation [facility])     Past Surgical History:  Procedure Laterality Date  . BREAST LUMPECTOMY    . CARDIAC CATHETERIZATION    . CHOLECYSTECTOMY    . ESOPHAGOGASTRODUODENOSCOPY (EGD) WITH PROPOFOL N/A 10/20/2020   Procedure: ESOPHAGOGASTRODUODENOSCOPY (EGD) WITH PROPOFOL;  Surgeon: Lin Landsman,  MD;  Location: [facility] ENDOSCOPY;  Service: Gastroenterology;  Laterality: N/A;  . MASTECTOMY Left   . PACEMAKER INSERTION Left 04/21/2019   Procedure: Pacemaker Change Out;  Surgeon: Cletis Athens, MD;  Location: [facility] ORS;  Service: Cardiovascular;  Laterality: Left;  . STENT PLACEMENT VASCULAR ([place] HX)      Social History:  reports that she has never smoked. She has never used smokeless tobacco. She reports  that she does not drink alcohol and does not use drugs.  No Known Allergies  Family History  Problem Relation Age of Onset  . Kidney failure Mother   . Lung cancer Father   . Throat cancer Son      Prior to Admission medications   Medication Sig Start Date End Date Taking? Authorizing Provider  acetaminophen (TYLENOL) 500 MG tablet Take 500 mg by mouth every 6 (six) hours as needed for mild pain.    [provider]  aspirin EC 81 MG tablet Take 81 mg by mouth daily.    [provider]  bumetanide (BUMEX) 2 MG tablet Hold until followup with outpatient doctor due to low blood pressure and acute kidney injury. 10/27/20   Enzo Bi, MD  fluticasone (FLONASE) 50 MCG/ACT nasal spray Place 2 sprays into both nostrils daily as needed for allergies.    [provider]  insulin aspart (NOVOLOG) 100 UNIT/ML injection Inject 3 Units into the skin 3 (three) times daily with meals. 10/27/20   Enzo Bi, MD  insulin glargine (LANTUS) 100 UNIT/ML injection Inject 0.13 mLs (13 Units total) into the skin at bedtime. 10/27/20   Enzo Bi, MD  levothyroxine (SYNTHROID) 75 MCG tablet Take 75 mcg by mouth daily. 07/22/19   [provider]  lidocaine (LIDODERM) 5 % Place 3 patches onto the skin daily. 10/27/20   Enzo Bi, MD  lisinopril (ZESTRIL) 20 MG tablet Hold until followup with outpatient doctor due to low blood pressure and acute kidney injury. 10/27/20   Enzo Bi, MD  metoprolol succinate (TOPROL-XL) 50 MG 24 hr tablet Take 1 tablet (50 mg total) by mouth daily. Take with or immediately following a meal. 10/27/20   Enzo Bi, MD  pantoprazole (PROTONIX) 40 MG tablet Take 1 tablet (40 mg total) by mouth 2 (two) times daily before a meal. 10/27/20 01/25/21  Enzo Bi, MD  paricalcitol [facility]) 1 MCG capsule Take 1 mcg by mouth every Monday, Wednesday, and Friday.    [provider]  polyethylene glycol (MIRALAX / GLYCOLAX) 17 g packet Take 17 g by mouth daily.  10/28/20   Enzo Bi, MD  rosuvastatin (CRESTOR) 10 MG tablet Take 10 mg by mouth daily.    [provider]  sucralfate (CARAFATE) 1 GM/10ML suspension Take 10 mLs (1 g total) by mouth 4 (four) times daily -  with meals and at bedtime for 10 days. 10/27/20 11/06/20  Enzo Bi, MD  vitamin B-12 1000 MCG tablet Take 1 tablet (1,000 mcg total) by mouth daily. 10/28/20   Enzo Bi, MD    Physical Exam: Vitals:   11/22/20 1830 11/22/20 1900 11/22/20 1930 11/22/20 2033  BP: 92/74 97/84 (!) 110/43 134/70  Pulse: (!) 136 (!) 130 (!) 103 90  Resp: (!) 29 18 (!) 21 (!) 24  Temp:      TempSrc:      SpO2: 100% 99% 100% 100%   Constitutional: Obese woman resting supine in bed, NAD, calm, comfortable Eyes: PERRL, lids and conjunctivae normal ENMT: Mucous membranes are dry. Posterior pharynx  clear of any exudate or lesions.Normal dentition.  Neck: normal, supple, no masses. Respiratory: clear to auscultation bilaterally, no wheezing, no crackles. Normal respiratory effort. No accessory muscle use.  Cardiovascular: Irregularly irregular, no murmurs / rubs / gallops.  +2 bilateral lower extremity edema. Abdomen: no tenderness, no masses palpated. Bowel sounds positive.  Musculoskeletal: no clubbing / cyanosis. No joint deformity upper and lower extremities. Good ROM, no contractures. Normal muscle tone.  Skin: no rashes, lesions, ulcers. No induration Neurologic: CN 2-12 grossly intact. Sensation intact. Strength equal bilaterally. Psychiatric: Normal judgment and insight. Alert and oriented x 3. Normal mood.   Labs on Admission: I have personally reviewed following labs and imaging studies  CBC: Recent Labs  Lab 11/22/20 1738  WBC 7.8  NEUTROABS 5.8  HGB 11.5*  HCT 35.0*  MCV 90.4  PLT 836   Basic Metabolic Panel: Recent Labs  Lab 11/22/20 1738  NA 139  K 2.9*  CL 102  CO2 26  GLUCOSE 53*  BUN 14  CREATININE 1.10*  CALCIUM 8.7*   GFR: CrCl cannot be calculated (Unknown  ideal weight.). Liver Function Tests: Recent Labs  Lab 11/22/20 1738  AST 32  ALT 17  ALKPHOS 80  BILITOT 0.5  PROT 6.4*  ALBUMIN 3.2*   Recent Labs  Lab 11/22/20 1738  LIPASE 21   No results for input(s): AMMONIA in the last 168 hours. Coagulation Profile: Recent Labs  Lab 11/22/20 1738  INR 1.2   Cardiac Enzymes: No results for input(s): CKTOTAL, CKMB, CKMBINDEX, TROPONINI in the last 168 hours. BNP (last 3 results) No results for input(s): PROBNP in the last 8760 hours. HbA1C: No results for input(s): HGBA1C in the last 72 hours. CBG: Recent Labs  Lab 11/22/20 1727 11/22/20 1956  GLUCAP 62* 96   Lipid Profile: No results for input(s): CHOL, HDL, LDLCALC, TRIG, CHOLHDL, LDLDIRECT in the last 72 hours. Thyroid Function Tests: Recent Labs    11/22/20 1738  TSH 2.618   Anemia Panel: No results for input(s): VITAMINB12, FOLATE, FERRITIN, TIBC, IRON, RETICCTPCT in the last 72 hours. Urine analysis:    Component Value Date/Time   COLORURINE AMBER (A) 11/22/2020 1840   APPEARANCEUR CLOUDY (A) 11/22/2020 1840   APPEARANCEUR Cloudy (A) 10/25/2019 1533   LABSPEC 1.009 11/22/2020 1840   LABSPEC 1.008 07/03/2012 1616   PHURINE 5.0 11/22/2020 1840   GLUCOSEU 50 (A) 11/22/2020 1840   GLUCOSEU Negative 07/03/2012 1616   HGBUR SMALL (A) 11/22/2020 1840   BILIRUBINUR NEGATIVE 11/22/2020 1840   BILIRUBINUR Negative 10/25/2019 1533   BILIRUBINUR Negative 07/03/2012 1616   KETONESUR NEGATIVE 11/22/2020 1840   PROTEINUR NEGATIVE 11/22/2020 1840   NITRITE NEGATIVE 11/22/2020 1840   LEUKOCYTESUR TRACE (A) 11/22/2020 1840   LEUKOCYTESUR Trace 07/03/2012 1616    Radiological Exams on Admission: US Venous Img Upper Uni Left  Result Date: 11/22/2020 CLINICAL DATA:  Left upper extremity pain. EXAM: LEFT UPPER EXTREMITY VENOUS DOPPLER ULTRASOUND TECHNIQUE: Gray-scale sonography with graded compression, as well as color Doppler and duplex ultrasound were performed to  evaluate the upper extremity deep venous system from the level of the subclavian vein and including the jugular, axillary, basilic, radial, ulnar and upper cephalic vein. Spectral Doppler was utilized to evaluate flow at rest and with distal augmentation maneuvers. COMPARISON:  None. FINDINGS: Contralateral Subclavian Vein: Respiratory phasicity is normal and symmetric with the symptomatic side. No evidence of thrombus. Normal compressibility. Internal Jugular Vein: No evidence of thrombus. Normal compressibility, respiratory phasicity and response to augmentation.  Subclavian Vein: No evidence of thrombus. Normal compressibility, respiratory phasicity and response to augmentation. Axillary Vein: No evidence of thrombus. Normal compressibility, respiratory phasicity and response to augmentation. Cephalic Vein: No evidence of thrombus. Normal compressibility, respiratory phasicity and response to augmentation. Basilic Vein: No evidence of thrombus. Normal compressibility, respiratory phasicity and response to augmentation. Brachial Veins: No evidence of thrombus. Normal compressibility, respiratory phasicity and response to augmentation. Radial Veins: No evidence of thrombus. Normal compressibility, respiratory phasicity and response to augmentation. Ulnar Veins: No evidence of thrombus. Normal compressibility, respiratory phasicity and response to augmentation. Venous Reflux:  None visualized. Other Findings: It should be noted that the study is limited secondary to edema and the patient's body habitus. IMPRESSION: No evidence of DVT within the LEFT upper extremity. Electronically Signed   By: Virgina Norfolk M.D.   On: 11/22/2020 19:41   DG Chest Port 1 View  Result Date: 11/22/2020 CLINICAL DATA:  Sepsis, diarrhea, weakness for 3 days EXAM: PORTABLE CHEST 1 VIEW COMPARISON:  10/17/2020 FINDINGS: Single frontal view of the chest demonstrates stable dual lead pacer. Cardiac silhouette is unremarkable. No  airspace disease, effusion, or pneumothorax. No acute bony abnormalities. IMPRESSION: 1. Stable chest, no acute process. Electronically Signed   By: Randa Ngo M.D.   On: 11/22/2020 18:18   DG Humerus Left  Result Date: 11/22/2020 CLINICAL DATA:  Left humeral pain EXAM: LEFT HUMERUS - 2+ VIEW COMPARISON:  None. FINDINGS: Frontal and lateral views of the left humerus demonstrates no fractures. Alignment of the left elbow and shoulder is anatomic. Mild spurring of the acromion process. Pacer overlies left chest. Visualized portions of the left chest are clear. IMPRESSION: 1. Unremarkable left humerus. Electronically Signed   By: Randa Ngo M.D.   On: 11/22/2020 18:16    EKG: Personally reviewed. A. fib, rate 99, low voltage. Rate is faster when compared to prior EKG which showed sinus rhythm.  Assessment/Plan Principal Problem:   Generalized weakness Active Problems:   Insulin dependent type 2 diabetes mellitus (HCC)   Hypertension associated with diabetes (HCC)   Paroxysmal atrial fibrillation (HCC)   CKD (chronic kidney disease), stage III (HCC)   Hyperlipidemia associated with type 2 diabetes mellitus ([place])   CAD (coronary artery disease)   Hypothyroidism   Regina [name] is a 87 y.o. female with medical history significant for CAD s/p PCI 2009, PAF not on anticoagulation due to history of GI bleed, SSS s/p PPM, CKD stage III, IDT2DM, hypothyroidism, hypertension, hyperlipidemia, and morbid obesity who is admitted with generalized weakness/dehydration/diarrhea.  Loose stools/dehydration/generalized weakness: Patient with chronic loose stools resulting in volume depletion as evidenced by hypotension, tachycardia, and mildly elevated lactic acid.  C. difficile and GI pathogen panels are negative.  She is deconditioned and unable to ambulate as she normally does with the use of a walker.  She lives alone at home and feels she is unable to properly care for herself any longer. -Start  Imodium as needed -Start gentle IV fluid hydration overnight -Request PT/OT consult -Continue fall precautions, check orthostatic vitals  UTI: Patient reports new urinary incontinence and dysuria with abnormal urinalysis. -Continue empiric IV ceftriaxone for now -Follow urine and blood culture  Paroxysmal atrial fibrillation SSS s/p PPM: Tachycardic on admission, suspect due to volume depletion.  CHA2DS2-VASc score is 6.  She is not on anticoagulation due to history of GI bleeding. -Resume home Toprol-XL -Continue aspirin 81 mg daily  Hypokalemia: Supplement.  Check magnesium and supplement if needed.  Hypoglycemia in  setting of insulin-dependent diabetes: Hold home insulin and place on D5-1/2 NS$RemoveBef'@75'RTtQPwtMuA$  mL/hour overnight.  Monitor CBGs.  Bilateral lower extremity edema: Check bilateral lower extremity ultrasound DVT studies.  No prior known CHF or echocardiogram on file and currently denies any respiratory symptoms.  Consider further eval with echocardiogram pending clinical progress.  Recent admission for acute blood loss anemia due to upper GI bleeding from esophageal ulcer: Stable and denies any further evidence of bleeding.  Hemoglobin stable.  Continue twice daily Protonix.Marland Kitchen  CAD s/p PCI 2009: Chronic and stable.  Denies any chest pain.  Troponin minimally elevated, likely demand ischemia from hypotension and A. fib.  Continue aspirin and rosuvastatin.  CKD stage III: Stable with renal function actually improved compared to recent baseline.  Continue to monitor.  Hypertension: Continue Toprol-XL as BP allows.  Previously on lisinopril and Bumex which are on hold due to low blood pressures.  Hypothyroidism: Continue Synthroid.  Hyperlipidemia: Continue rosuvastatin.  DVT prophylaxis: SCDs given recent upper GI bleed Code Status: DNR, confirmed with patient on admission Family Communication: Discussed with patient, she has discussed with family Disposition Plan: From  home, likely discharge to SNF Consults called: None Level of care: Med-Surg Admission status:  Status is: Observation  The patient remains OBS appropriate and will d/c before 2 midnights.  Dispo: The patient is from: Home              Anticipated d/c is to: SNF              Patient currently is not medically stable to d/c.    Zada Finders MD [facility]  If 7PM-7AM, please contact night-coverage [email or web address]  11/22/2020, 9:07 PM

## 2020-11-22 NOTE — ED Notes (Signed)
CBG 62, will give apple juice until established IV

## 2020-11-22 NOTE — ED Notes (Signed)
Lab at bedside

## 2020-11-22 NOTE — ED Provider Notes (Signed)
[facility] Emergency Department Provider Note   ____________________________________________   Event Date/Time   First MD Initiated Contact with Patient 11/22/20 1651     (approximate)  I have reviewed the triage vital signs and the nursing notes.   HISTORY  Chief Complaint No chief complaint on file.    HPI Regina [name] is a 87 y.o. female history of coronary disease diabetes hypertension hyperlipidemia paroxysmal A. fib  Patient recently admitted and discharged, she had upper GI bleed.  AKI.  She reports she went to [employer] left there yesterday.  Since then she has been home she reports she does not know why but she has been just feeling very weak and fatigued and having loose stools watery.  Denies any bloody or black stools.  Reports not the same as when she had bleeding.  She is been having very loose stools, not sure why.  She does report though that she was recently on an antibiotic at [facility] but does not know why.  [place] in that it is April, 2022.  She is not in pain or discomfort except she been having a persistent arm pain for several weeks in her left upper arm.   Is pain is located on left upper arm and present for about 2 weeks or more  No nausea no vomiting.  No abdominal pain.  No fever no pain or burning with urination  Past Medical History:  Diagnosis Date  . Breast cancer, left [facility]) 2013   Mastectomy.   . CAD (coronary artery disease)    s/p cath in 2009 with 2 stents  . Diabetes mellitus without complication ([place])   . GERD (gastroesophageal reflux disease)   . HLD (hyperlipidemia)   . Hypertension   . Hypothyroidism   . Paroxysmal atrial fibrillation [facility])     Patient Active Problem List   Diagnosis Date Noted  . Generalized weakness 11/22/2020  . CKD (chronic kidney disease), stage III ([place]) 11/22/2020  . Hyperlipidemia associated with type 2 diabetes mellitus ([place]) 11/22/2020  . CAD (coronary  artery disease)   . Hypothyroidism   . Acute upper GI bleeding 10/17/2020  . Pacemaker 12/16/2019  . History of cancer of left breast 12/16/2019  . Sick sinus syndrome due to SA node dysfunction ([place]) 12/16/2019  . Pressure injury of skin 09/26/2016  . C. difficile colitis 09/25/2016  . Sepsis ([place]) 08/28/2016  . Cellulitis 06/26/2016  . UTI (lower urinary tract infection) 02/20/2015  . Elevated troponin 02/20/2015  . Hypoglycemia 02/20/2015  . Insulin dependent type 2 diabetes mellitus ([place]) 02/20/2015  . Hypertension associated with diabetes ([place]) 02/20/2015  . HLD (hyperlipidemia) 02/20/2015  . Paroxysmal atrial fibrillation ([place]) 02/20/2015  . GERD (gastroesophageal reflux disease) 02/20/2015    Past Surgical History:  Procedure Laterality Date  . BREAST LUMPECTOMY    . CARDIAC CATHETERIZATION    . CHOLECYSTECTOMY    . ESOPHAGOGASTRODUODENOSCOPY (EGD) WITH PROPOFOL N/A 10/20/2020   Procedure: ESOPHAGOGASTRODUODENOSCOPY (EGD) WITH PROPOFOL;  Surgeon: Lin Landsman, MD;  Location: [place];  Service: Gastroenterology;  Laterality: N/A;  . MASTECTOMY Left   . PACEMAKER INSERTION Left 04/21/2019   Procedure: Pacemaker Change Out;  Surgeon: Cletis Athens, MD;  Location: [facility] ORS;  Service: Cardiovascular;  Laterality: Left;  . STENT PLACEMENT VASCULAR ([place] HX)      Prior to Admission medications   Medication Sig Start Date End Date Taking? Authorizing Provider  acetaminophen (TYLENOL) 500 MG tablet Take 500 mg by mouth  every 6 (six) hours as needed for mild pain.    [provider]  aspirin EC 81 MG tablet Take 81 mg by mouth daily.    [provider]  bumetanide (BUMEX) 2 MG tablet Hold until followup with outpatient doctor due to low blood pressure and acute kidney injury. 10/27/20   Enzo Bi, MD  fluticasone (FLONASE) 50 MCG/ACT nasal spray Place 2 sprays into both nostrils daily as needed for allergies.    [provider]  insulin aspart  (NOVOLOG) 100 UNIT/ML injection Inject 3 Units into the skin 3 (three) times daily with meals. 10/27/20   Enzo Bi, MD  insulin glargine (LANTUS) 100 UNIT/ML injection Inject 0.13 mLs (13 Units total) into the skin at bedtime. 10/27/20   Enzo Bi, MD  levothyroxine (SYNTHROID) 75 MCG tablet Take 75 mcg by mouth daily. 07/22/19   [provider]  lidocaine (LIDODERM) 5 % Place 3 patches onto the skin daily. 10/27/20   Enzo Bi, MD  lisinopril (ZESTRIL) 20 MG tablet Hold until followup with outpatient doctor due to low blood pressure and acute kidney injury. 10/27/20   Enzo Bi, MD  metoprolol succinate (TOPROL-XL) 50 MG 24 hr tablet Take 1 tablet (50 mg total) by mouth daily. Take with or immediately following a meal. 10/27/20   Enzo Bi, MD  pantoprazole (PROTONIX) 40 MG tablet Take 1 tablet (40 mg total) by mouth 2 (two) times daily before a meal. 10/27/20 01/25/21  Enzo Bi, MD  paricalcitol [facility]) 1 MCG capsule Take 1 mcg by mouth every Monday, Wednesday, and Friday.    [provider]  polyethylene glycol (MIRALAX / GLYCOLAX) 17 g packet Take 17 g by mouth daily. 10/28/20   Enzo Bi, MD  rosuvastatin (CRESTOR) 10 MG tablet Take 10 mg by mouth daily.    [provider]  sucralfate (CARAFATE) 1 GM/10ML suspension Take 10 mLs (1 g total) by mouth 4 (four) times daily -  with meals and at bedtime for 10 days. 10/27/20 11/06/20  Enzo Bi, MD  vitamin B-12 1000 MCG tablet Take 1 tablet (1,000 mcg total) by mouth daily. 10/28/20   Enzo Bi, MD    Allergies Patient has no known allergies.  Family History  Problem Relation Age of Onset  . Kidney failure Mother   . Lung cancer Father   . Throat cancer Son     Social History Social History   Tobacco Use  . Smoking status: Never Smoker  . Smokeless tobacco: Never Used  Substance Use Topics  . Alcohol use: No    Alcohol/week: 0.0 standard drinks  . Drug use: No    Review of Systems Constitutional: No  fever/chills but reports he just feels very weak and she is weak to the point now she cannot stand up.  This is a big issue for her as she just returned home from [employer] and cannot get around her house at all Eyes: No visual changes. ENT: No sore throat. Cardiovascular: Denies chest pain. Respiratory: Denies shortness of breath. Gastrointestinal: No abdominal pain.  Loose watery stools 3 large once a [place] Genitourinary: Negative for dysuria. Musculoskeletal: Negative for back pain.  Left arm pain for 2 weeks of unknown cause no pain in her chest located mostly in her left upper arm with no bruising or bleeding.  Denies numbness weakness or cold hand. Skin: Negative for rash. Neurological: Negative for headaches, areas of focal weakness or numbness.  Feels like her body is weak all over.  ____________________________________________   PHYSICAL EXAM:  VITAL SIGNS: ED Triage Vitals  Enc Vitals Group     BP 11/22/20 1654 (!) 118/54     Pulse Rate 11/22/20 1654 (!) 110     Resp 11/22/20 1654 20     Temp 11/22/20 1654 98.1 F (36.7 C)     Temp Source 11/22/20 1654 Oral     SpO2 11/22/20 1654 100 %     Weight --      Height --      Head Circumference --      Peak Flow --      Pain Score 11/22/20 1701 10     Pain Loc --      Pain Edu? --      Excl. in [place]? --     Constitutional: Alert and oriented.  Chronically ill in appearance, fatigued but in no acute distress Eyes: Conjunctivae are normal. Head: Atraumatic. Nose: No congestion/rhinnorhea. Mouth/Throat: Mucous membranes are slightly dry. Neck: No stridor.  Cardiovascular: Mildly tachycardic with a regular rhythm.  Grossly normal heart sounds.  Good peripheral circulation. Respiratory: Normal respiratory effort.  No retractions. Lungs CTAB.  Diminished lung sounds at bases bilateral. Gastrointestinal: Soft and nontender. No distention.  Morbidly obese.  Denies pain to palpation in any quadrant.  No rebound or  guarding.  Benign abdominal exam. Musculoskeletal: No lower extremity tenderness nor edema.  Exhibits generalized weakness in all extremities about 4-5.  Left upper extremity tenderness over the left humerus without lesion.  Strong left radial pulse.  Good use of the left hand.  No notable edema left versus right, but she does have very large amounts of adipose tissue in both upper arms. Neurologic:  Normal speech and language. No gross focal neurologic deficits are appreciated.  She is very well oriented.  Pleasant and conversant Skin:  Skin is slightly cool to touch, dry and intact. No rash noted. Psychiatric: Mood and affect are normal. Speech and behavior are normal.  ____________________________________________   LABS (all labs ordered are listed, but only abnormal results are displayed)  Labs Reviewed  LACTIC ACID, PLASMA - Abnormal; Notable for the following components:      Result Value   Lactic Acid, Venous 2.4 (*)    All other components within normal limits  COMPREHENSIVE METABOLIC PANEL - Abnormal; Notable for the following components:   Potassium 2.9 (*)    Glucose, Bld 53 (*)    Creatinine, Ser 1.10 (*)    Calcium 8.7 (*)    Total Protein 6.4 (*)    Albumin 3.2 (*)    GFR, Estimated 49 (*)    All other components within normal limits  CBC WITH DIFFERENTIAL/PLATELET - Abnormal; Notable for the following components:   Hemoglobin 11.5 (*)    HCT 35.0 (*)    All other components within normal limits  URINALYSIS, COMPLETE (UACMP) WITH MICROSCOPIC - Abnormal; Notable for the following components:   Color, Urine AMBER (*)    APPearance CLOUDY (*)    Glucose, UA 50 (*)    Hgb urine dipstick SMALL (*)    Leukocytes,Ua TRACE (*)    Bacteria, UA MANY (*)    All other components within normal limits  BRAIN NATRIURETIC PEPTIDE - Abnormal; Notable for the following components:   B Natriuretic Peptide 275.1 (*)    All other components within normal limits  CBG MONITORING, ED -  Abnormal; Notable for the following components:   Glucose-Capillary 62 (*)    All other components  within normal limits  TROPONIN I (HIGH SENSITIVITY) - Abnormal; Notable for the following components:   Troponin I (High Sensitivity) 23 (*)    All other components within normal limits  C DIFFICILE QUICK SCREEN W PCR REFLEX  CULTURE, BLOOD (SINGLE)  URINE CULTURE  GASTROINTESTINAL PANEL BY PCR, STOOL (REPLACES STOOL CULTURE)  RESP PANEL BY RT-PCR (FLU A&B, COVID) ARPGX2  PROTIME-INR  APTT  LIPASE, BLOOD  TSH  LACTIC ACID, PLASMA  MAGNESIUM  CBG MONITORING, ED  CBG MONITORING, ED  CBG MONITORING, ED   ____________________________________________  EKG Reviewed inter by me at 2025 Heart rate 100 QRS normal QT approximately 380 A. fib, borderline RVR.  Low voltage.  Possible old septal infarct.  No evidence of acute ischemia  ____________________________________________  RADIOLOGY  US Venous Img Upper Uni Left  Result Date: 11/22/2020 CLINICAL DATA:  Left upper extremity pain. EXAM: LEFT UPPER EXTREMITY VENOUS DOPPLER ULTRASOUND TECHNIQUE: Gray-scale sonography with graded compression, as well as color Doppler and duplex ultrasound were performed to evaluate the upper extremity deep venous system from the level of the subclavian vein and including the jugular, axillary, basilic, radial, ulnar and upper cephalic vein. Spectral Doppler was utilized to evaluate flow at rest and with distal augmentation maneuvers. COMPARISON:  None. FINDINGS: Contralateral Subclavian Vein: Respiratory phasicity is normal and symmetric with the symptomatic side. No evidence of thrombus. Normal compressibility. Internal Jugular Vein: No evidence of thrombus. Normal compressibility, respiratory phasicity and response to augmentation. Subclavian Vein: No evidence of thrombus. Normal compressibility, respiratory phasicity and response to augmentation. Axillary Vein: No evidence of thrombus. Normal  compressibility, respiratory phasicity and response to augmentation. Cephalic Vein: No evidence of thrombus. Normal compressibility, respiratory phasicity and response to augmentation. Basilic Vein: No evidence of thrombus. Normal compressibility, respiratory phasicity and response to augmentation. Brachial Veins: No evidence of thrombus. Normal compressibility, respiratory phasicity and response to augmentation. Radial Veins: No evidence of thrombus. Normal compressibility, respiratory phasicity and response to augmentation. Ulnar Veins: No evidence of thrombus. Normal compressibility, respiratory phasicity and response to augmentation. Venous Reflux:  None visualized. Other Findings: It should be noted that the study is limited secondary to edema and the patient's body habitus. IMPRESSION: No evidence of DVT within the LEFT upper extremity. Electronically Signed   By: Virgina Norfolk M.D.   On: 11/22/2020 19:41   DG Chest Port 1 View  Result Date: 11/22/2020 CLINICAL DATA:  Sepsis, diarrhea, weakness for 3 days EXAM: PORTABLE CHEST 1 VIEW COMPARISON:  10/17/2020 FINDINGS: Single frontal view of the chest demonstrates stable dual lead pacer. Cardiac silhouette is unremarkable. No airspace disease, effusion, or pneumothorax. No acute bony abnormalities. IMPRESSION: 1. Stable chest, no acute process. Electronically Signed   By: Randa Ngo M.D.   On: 11/22/2020 18:18   DG Humerus Left  Result Date: 11/22/2020 CLINICAL DATA:  Left humeral pain EXAM: LEFT HUMERUS - 2+ VIEW COMPARISON:  None. FINDINGS: Frontal and lateral views of the left humerus demonstrates no fractures. Alignment of the left elbow and shoulder is anatomic. Mild spurring of the acromion process. Pacer overlies left chest. Visualized portions of the left chest are clear. IMPRESSION: 1. Unremarkable left humerus. Electronically Signed   By: Randa Ngo M.D.   On: 11/22/2020 18:16    Imaging reviewed.  No evidence DVT left upper  extremity.  Chest x-ray negative for acute findings.  Noted pacemaker.  Additionally no acute findings on left humerus ____________________________________________   PROCEDURES  Procedure(s) performed: None  Procedures  Critical  Care performed: No  ____________________________________________   INITIAL IMPRESSION / ASSESSMENT AND PLAN / ED COURSE  Pertinent labs & imaging results that were available during my care of the patient were reviewed by me and considered in my medical decision making (see chart for details).   Patient presents for generalized weakness worsening over the last [place] associated with loose watery stools.  Very reassuring benign abdominal exam.  She does demonstrate what appears to be A. fib on the monitor, and generalized weakness.  She is certainly at risk for infection, C. difficile etc.  Will attempt to obtain stool culture.  Additionally, broad labs broad differential and work-up.  I suspect her A. fib and RVR may be slightly in response to potential dehydration but I am unclear without labs.  Labs are pending.  No acute or central neurologic symptoms.  No chest pain.  Does report left upper arm pain, will evaluate with upper extremity ultrasound and humerus x-ray to evaluate for possible etiology as unclear as to cause of her pain by clinical exam alone.    ----------------------------------------- 8:20 PM on 11/22/2020 -----------------------------------------  Work-up to this point most indicative of possible urinary tract infection.  This may be precipitating her generalized weakness on top of deconditioning I suspect.  Because she is now no longer able to ambulate or care for herself at home, will plan to admit to the hospital as well as for treatment of urinary tract infection.  Patient just left a rehab facility.  Discussed case and admission with Dr. Posey Pronto.  Vitals:   11/22/20 1900 11/22/20 1930  BP: 97/84 (!) 110/43  Pulse: (!) 130 (!) 103  Resp: 18  (!) 21  Temp:    SpO2: 99% 100%    Laboratory testing to this point notable for mild hypokalemia, brief hypoglycemia which corrected with juice and dextrose.  Now normoglycemic.  Minimally elevated BNP and troponin.  Suspect likely very mild demand ischemia.  CBC normal white count slight anemia is chronic  ____________________________________________   FINAL CLINICAL IMPRESSION(S) / ED DIAGNOSES  Final diagnoses:  Urinary tract infection, acute  Generalized weakness  AF (paroxysmal atrial fibrillation) ([place])        Note:  This document was prepared using Dragon voice recognition software and may include unintentional dictation errors       Delman Kitten, MD 11/22/20 2026

## 2020-11-23 ENCOUNTER — Encounter: Payer: Self-pay | Admitting: Internal Medicine

## 2020-11-23 ENCOUNTER — Other Ambulatory Visit: Payer: Self-pay

## 2020-11-23 DIAGNOSIS — E162 Hypoglycemia, unspecified: Secondary | ICD-10-CM | POA: Diagnosis not present

## 2020-11-23 DIAGNOSIS — Z6841 Body Mass Index (BMI) 40.0 and over, adult: Secondary | ICD-10-CM | POA: Diagnosis not present

## 2020-11-23 DIAGNOSIS — Z950 Presence of cardiac pacemaker: Secondary | ICD-10-CM | POA: Diagnosis not present

## 2020-11-23 DIAGNOSIS — I480 Paroxysmal atrial fibrillation: Secondary | ICD-10-CM | POA: Diagnosis present

## 2020-11-23 DIAGNOSIS — Z9861 Coronary angioplasty status: Secondary | ICD-10-CM | POA: Diagnosis not present

## 2020-11-23 DIAGNOSIS — E1169 Type 2 diabetes mellitus with other specified complication: Secondary | ICD-10-CM | POA: Diagnosis present

## 2020-11-23 DIAGNOSIS — I495 Sick sinus syndrome: Secondary | ICD-10-CM | POA: Diagnosis present

## 2020-11-23 DIAGNOSIS — E039 Hypothyroidism, unspecified: Secondary | ICD-10-CM | POA: Diagnosis present

## 2020-11-23 DIAGNOSIS — I2510 Atherosclerotic heart disease of native coronary artery without angina pectoris: Secondary | ICD-10-CM | POA: Diagnosis present

## 2020-11-23 DIAGNOSIS — Z794 Long term (current) use of insulin: Secondary | ICD-10-CM | POA: Diagnosis not present

## 2020-11-23 DIAGNOSIS — N179 Acute kidney failure, unspecified: Secondary | ICD-10-CM | POA: Diagnosis present

## 2020-11-23 DIAGNOSIS — I152 Hypertension secondary to endocrine disorders: Secondary | ICD-10-CM | POA: Diagnosis present

## 2020-11-23 DIAGNOSIS — E785 Hyperlipidemia, unspecified: Secondary | ICD-10-CM | POA: Diagnosis present

## 2020-11-23 DIAGNOSIS — E11649 Type 2 diabetes mellitus with hypoglycemia without coma: Secondary | ICD-10-CM | POA: Diagnosis present

## 2020-11-23 DIAGNOSIS — E1122 Type 2 diabetes mellitus with diabetic chronic kidney disease: Secondary | ICD-10-CM | POA: Diagnosis present

## 2020-11-23 DIAGNOSIS — R531 Weakness: Secondary | ICD-10-CM | POA: Diagnosis not present

## 2020-11-23 DIAGNOSIS — E119 Type 2 diabetes mellitus without complications: Secondary | ICD-10-CM | POA: Diagnosis not present

## 2020-11-23 DIAGNOSIS — E860 Dehydration: Secondary | ICD-10-CM | POA: Diagnosis present

## 2020-11-23 DIAGNOSIS — N390 Urinary tract infection, site not specified: Secondary | ICD-10-CM | POA: Diagnosis present

## 2020-11-23 DIAGNOSIS — N1832 Chronic kidney disease, stage 3b: Secondary | ICD-10-CM | POA: Diagnosis present

## 2020-11-23 DIAGNOSIS — B961 Klebsiella pneumoniae [K. pneumoniae] as the cause of diseases classified elsewhere: Secondary | ICD-10-CM | POA: Diagnosis present

## 2020-11-23 DIAGNOSIS — K529 Noninfective gastroenteritis and colitis, unspecified: Secondary | ICD-10-CM | POA: Diagnosis present

## 2020-11-23 DIAGNOSIS — I959 Hypotension, unspecified: Secondary | ICD-10-CM | POA: Diagnosis present

## 2020-11-23 DIAGNOSIS — Z66 Do not resuscitate: Secondary | ICD-10-CM | POA: Diagnosis present

## 2020-11-23 DIAGNOSIS — E876 Hypokalemia: Secondary | ICD-10-CM | POA: Diagnosis present

## 2020-11-23 DIAGNOSIS — I351 Nonrheumatic aortic (valve) insufficiency: Secondary | ICD-10-CM | POA: Diagnosis not present

## 2020-11-23 DIAGNOSIS — Z20822 Contact with and (suspected) exposure to covid-19: Secondary | ICD-10-CM | POA: Diagnosis present

## 2020-11-23 LAB — BASIC METABOLIC PANEL
Anion gap: 11 (ref 5–15)
BUN: 12 mg/dL (ref 8–23)
CO2: 25 mmol/L (ref 22–32)
Calcium: 8 mg/dL — ABNORMAL LOW (ref 8.9–10.3)
Chloride: 101 mmol/L (ref 98–111)
Creatinine, Ser: 1.14 mg/dL — ABNORMAL HIGH (ref 0.44–1.00)
GFR, Estimated: 47 mL/min — ABNORMAL LOW (ref >60)
Glucose, Bld: 81 mg/dL (ref 70–99)
Potassium: 3 mmol/L — ABNORMAL LOW (ref 3.5–5.1)
Sodium: 137 mmol/L (ref 135–145)

## 2020-11-23 LAB — CBC
HCT: 34.7 % — ABNORMAL LOW (ref 36.0–46.0)
Hemoglobin: 11.1 g/dL — ABNORMAL LOW (ref 12.0–15.0)
MCH: 29.9 pg (ref 26.0–34.0)
MCHC: 32 g/dL (ref 30.0–36.0)
MCV: 93.5 fL (ref 80.0–100.0)
Platelets: 236 10*3/uL (ref 150–400)
RBC: 3.71 MIL/uL — ABNORMAL LOW (ref 3.87–5.11)
RDW: 14.5 % (ref 11.5–15.5)
WBC: 9.3 10*3/uL (ref 4.0–10.5)
nRBC: 0 % (ref 0.0–0.2)

## 2020-11-23 LAB — CBG MONITORING, ED
Glucose-Capillary: 103 mg/dL — ABNORMAL HIGH (ref 70–99)
Glucose-Capillary: 57 mg/dL — ABNORMAL LOW (ref 70–99)
Glucose-Capillary: 80 mg/dL (ref 70–99)

## 2020-11-23 LAB — GLUCOSE, CAPILLARY
Glucose-Capillary: 124 mg/dL — ABNORMAL HIGH (ref 70–99)
Glucose-Capillary: 202 mg/dL — ABNORMAL HIGH (ref 70–99)

## 2020-11-23 LAB — POTASSIUM: Potassium: 4.6 mmol/L (ref 3.5–5.1)

## 2020-11-23 LAB — MAGNESIUM: Magnesium: 2.1 mg/dL (ref 1.7–2.4)

## 2020-11-23 LAB — PHOSPHORUS: Phosphorus: 3.4 mg/dL (ref 2.5–4.6)

## 2020-11-23 LAB — LACTIC ACID, PLASMA: Lactic Acid, Venous: 2.4 mmol/L (ref 0.5–1.9)

## 2020-11-23 MED ORDER — POTASSIUM CHLORIDE IN NACL 40-0.9 MEQ/L-% IV SOLN
INTRAVENOUS | Status: DC
  Filled 2020-11-23 (×4): qty 1000

## 2020-11-23 MED ORDER — SODIUM CHLORIDE 0.9 % IV BOLUS
500.0000 mL | Freq: Once | INTRAVENOUS | Status: AC
  Administered 2020-11-23: 500 mL via INTRAVENOUS

## 2020-11-23 MED ORDER — RANOLAZINE ER 500 MG PO TB12
500.0000 mg | ORAL_TABLET | Freq: Two times a day (BID) | ORAL | Status: DC
  Administered 2020-11-23 – 2020-11-30 (×14): 500 mg via ORAL
  Filled 2020-11-23 (×16): qty 1

## 2020-11-23 MED ORDER — PARICALCITOL 1 MCG PO CAPS
1.0000 ug | ORAL_CAPSULE | ORAL | Status: DC
  Filled 2020-11-23: qty 1

## 2020-11-23 MED ORDER — LOPERAMIDE HCL 2 MG PO CAPS
2.0000 mg | ORAL_CAPSULE | ORAL | Status: DC | PRN
  Administered 2020-11-23: 2 mg via ORAL
  Filled 2020-11-23 (×2): qty 1

## 2020-11-23 NOTE — Progress Notes (Signed)
PT Cancellation Note  Patient Details Name: Regina [name] MRN: [id number] DOB: 1934/05/21   Cancelled Treatment:    Reason Eval/Treat Not Completed: Other (comment).  PT consult received.  Chart reviewed.  Therapist has tried multiple times to evaluate pt today but pt not available d/t transferring from ED to medical unit, then IV team present to place IV access, then pt eating lunch, and then lab present.  While staff present obtaining labs, therapist stopped to speak with pt and pt reporting she did not want to work with physical therapy today (already worked with OT this morning) and when she did she would not attempt standing because she did not have her bedroom shoes here (concerned about her feet sliding); discussed with pt having family member, friend, or neighbor bring in bedroom shoes but pt reports having "no one" to do that.  Will re-attempt PT evaluation at a later date/time.  Leitha Bleak, PT 11/23/20, 2:47 PM

## 2020-11-23 NOTE — ED Notes (Signed)
MD notified of pt's bp.

## 2020-11-23 NOTE — ED Notes (Signed)
Pt brief checked, pt clean and dry at present.

## 2020-11-23 NOTE — Progress Notes (Addendum)
Progress Note    Loriel Diehl Vandervoort  GBT:[id number] DOB: 29-Mar-1934  DOA: 11/22/2020 PCP: Donnie Coffin, MD      Brief Narrative:    Medical records reviewed and are as summarized below:  Wallene Dales Wyman is a 87 y.o. female       Assessment/Plan:   Principal Problem:   Generalized weakness Active Problems:   Acute lower UTI   Hypoglycemia   Insulin dependent type 2 diabetes mellitus ([place])   Hypertension associated with diabetes ([place])   Paroxysmal atrial fibrillation ([facility])   CKD (chronic kidney disease), stage III ([place])   Hyperlipidemia associated with type 2 diabetes mellitus ([place])   CAD (coronary artery disease)   Hypothyroidism   Hypotension   There is no height or weight on file to calculate BMI.    Probable acute UTI/bacteriuria: Continue IV ceftriaxone for now.  Follow-up urine and blood cultures.  Diarrhea, dehydration, hypotension: Stool for C. difficile toxin was negative.  Continue IV fluids.  Hold antihypertensives  Hypokalemia: Replete potassium and monitor levels.  Repeat potassium level today.  Hypoglycemia: Glucose level improved after treatment.  Hold home insulin therapy.  Monitor glucose levels closely.  Paroxysmal atrial fibrillation, sick sinus syndrome s/p permanent pacemaker: Continue aspirin. CHA2DS2-VASC score is 6 but she is not on anticoagulation because of history of GI bleeding.  Hyperlipidemia, CAD s/p PCI 2009: Continue aspirin and rosuvastatin  Generalized weakness: PT OT evaluation  Chronic low back pain: Analgesics as needed for pain  Recent admission for acute blood loss anemia due to upper GI bleed from esophageal ulcer..  Continue Protonix  Diet Order            DIET SOFT Room service appropriate? Yes; Fluid consistency: Thin  Diet effective now                    Consultants:  None  Procedures:  None    Medications:   . aspirin EC  81 mg Oral Daily  . levothyroxine  75 mcg Oral Daily  . pantoprazole   40 mg Oral BID AC  . [START ON 11/24/2020] paricalcitol  1 mcg Oral Q M,W,F  . potassium chloride  40 mEq Oral Once  . ranolazine  500 mg Oral BID  . rosuvastatin  10 mg Oral Daily  . sodium chloride flush  3 mL Intravenous Q12H  . cyanocobalamin  1,000 mcg Oral Daily   Continuous Infusions: . 0.9 % NaCl with KCl 40 mEq / L 75 mL/hr at 11/23/20 1020  . cefTRIAXone (ROCEPHIN)  IV       Anti-infectives (From admission, onward)   Start     Dose/Rate Route Frequency Ordered Stop   11/23/20 2000  cefTRIAXone (ROCEPHIN) 1 g in sodium chloride 0.9 % 100 mL IVPB        1 g 200 mL/hr over 30 Minutes Intravenous Every 24 hours 11/22/20 2055     11/22/20 2000  cefTRIAXone (ROCEPHIN) 1 g in sodium chloride 0.9 % 100 mL IVPB        1 g 200 mL/hr over 30 Minutes Intravenous  Once 11/22/20 1952 11/23/20 0322             Family Communication/Anticipated D/C date and plan/Code Status   DVT prophylaxis: SCDs Start: 11/22/20 2051     Code Status: DNR  Family Communication: None Disposition Plan:    Status is: Observation  The patient will require care spanning > 2 midnights and should  be moved to inpatient because: IV treatments appropriate due to intensity of illness or inability to take PO  Dispo: The patient is from: Home              Anticipated d/c is to: Home              Patient currently is not medically stable to d/c.   Difficult to place patient No           Subjective:   C/o generalized weakness.  No vomiting or abdominal pain.  Objective:    Vitals:   11/23/20 0915 11/23/20 1022 11/23/20 1030 11/23/20 1223  BP:  (!) 81/69 94/65 117/80  Pulse: 100 72 92 90  Resp: (!) 23 (!) 22  16  Temp:    97.9 F (36.6 C)  TempSrc:      SpO2: 100% 96% 100% 100%   No data found.   Intake/Output Summary (Last 24 hours) at 11/23/2020 1428 Last data filed at 11/23/2020 0748 Gross per 24 hour  Intake 300 ml  Output --  Net 300 ml   There were no vitals filed  for this visit.  Exam:  GEN: NAD SKIN: No rash EYES: EOMI ENT: MMM CV: RRR PULM: CTA B ABD: soft, obese, NT, +BS CNS: AAO x 3, non focal EXT: B/l leg edema, no tenderness        Data Reviewed:   I have personally reviewed following labs and imaging studies:  Labs: Labs show the following:   Basic Metabolic Panel: Recent Labs  Lab 11/22/20 1738 11/23/20 0842  NA 139 137  K 2.9* 3.0*  CL 102 101  CO2 26 25  GLUCOSE 53* 81  BUN 14 12  CREATININE 1.10* 1.14*  CALCIUM 8.7* 8.0*  MG 1.6* 2.1   GFR CrCl cannot be calculated (Unknown ideal weight.). Liver Function Tests: Recent Labs  Lab 11/22/20 1738  AST 32  ALT 17  ALKPHOS 80  BILITOT 0.5  PROT 6.4*  ALBUMIN 3.2*   Recent Labs  Lab 11/22/20 1738  LIPASE 21   No results for input(s): AMMONIA in the last 168 hours. Coagulation profile Recent Labs  Lab 11/22/20 1738  INR 1.2    CBC: Recent Labs  Lab 11/22/20 1738 11/23/20 0842  WBC 7.8 9.3  NEUTROABS 5.8  --   HGB 11.5* 11.1*  HCT 35.0* 34.7*  MCV 90.4 93.5  PLT 272 236   Cardiac Enzymes: No results for input(s): CKTOTAL, CKMB, CKMBINDEX, TROPONINI in the last 168 hours. BNP (last 3 results) No results for input(s): PROBNP in the last 8760 hours. CBG: Recent Labs  Lab 11/22/20 2135 11/22/20 2254 11/23/20 0159 11/23/20 0742 11/23/20 0852  GLUCAP 32* 86 103* 57* 80   D-Dimer: No results for input(s): DDIMER in the last 72 hours. Hgb A1c: No results for input(s): HGBA1C in the last 72 hours. Lipid Profile: No results for input(s): CHOL, HDL, LDLCALC, TRIG, CHOLHDL, LDLDIRECT in the last 72 hours. Thyroid function studies: Recent Labs    11/22/20 1738  TSH 2.618   Anemia work up: No results for input(s): VITAMINB12, FOLATE, FERRITIN, TIBC, IRON, RETICCTPCT in the last 72 hours. Sepsis Labs: Recent Labs  Lab 11/22/20 1736 11/22/20 1738 11/23/20 0842  WBC  --  7.8 9.3  LATICACIDVEN 2.4*  --  2.4*     Microbiology Recent Results (from the past 240 hour(s))  Blood culture (routine single)     Status: None (Preliminary result)   Collection Time: 11/22/20  5:36 PM   Specimen: BLOOD  Result Value Ref Range Status   Specimen Description BLOOD BLOOD RIGHT FOREARM  Final   Special Requests   Final    BOTTLES DRAWN AEROBIC AND ANAEROBIC Blood Culture adequate volume   Culture   Final    NO GROWTH < 12 HOURS Performed at [facility], [address], [place], [place] [address]    Report Status PENDING  Incomplete  C Difficile Quick Screen w PCR reflex     Status: None   Collection Time: 11/22/20  6:42 PM   Specimen: Urine, Clean Catch; Stool  Result Value Ref Range Status   C Diff antigen NEGATIVE NEGATIVE Final   C Diff toxin NEGATIVE NEGATIVE Final   C Diff interpretation No C. difficile detected.  Final    Comment: Performed at [facility], [address] [place], [place] [address]  Gastrointestinal Panel by PCR , Stool     Status: None   Collection Time: 11/22/20  6:43 PM   Specimen: Urine, Clean Catch; Stool  Result Value Ref Range Status   Campylobacter species NOT DETECTED NOT DETECTED Final   Plesimonas shigelloides NOT DETECTED NOT DETECTED Final   Salmonella species NOT DETECTED NOT DETECTED Final   Yersinia enterocolitica NOT DETECTED NOT DETECTED Final   Vibrio species NOT DETECTED NOT DETECTED Final   Vibrio cholerae NOT DETECTED NOT DETECTED Final   Enteroaggregative E coli (EAEC) NOT DETECTED NOT DETECTED Final   Enteropathogenic E coli (EPEC) NOT DETECTED NOT DETECTED Final   Enterotoxigenic E coli (ETEC) NOT DETECTED NOT DETECTED Final   Shiga like toxin producing E coli (STEC) NOT DETECTED NOT DETECTED Final   Shigella/Enteroinvasive E coli (EIEC) NOT DETECTED NOT DETECTED Final   Cryptosporidium NOT DETECTED NOT DETECTED Final   Cyclospora cayetanensis NOT DETECTED NOT DETECTED Final   Entamoeba histolytica NOT DETECTED NOT DETECTED  Final   Giardia lamblia NOT DETECTED NOT DETECTED Final   Adenovirus F40/41 NOT DETECTED NOT DETECTED Final   Astrovirus NOT DETECTED NOT DETECTED Final   Norovirus GI/GII NOT DETECTED NOT DETECTED Final   Rotavirus A NOT DETECTED NOT DETECTED Final   Sapovirus (I, II, IV, and V) NOT DETECTED NOT DETECTED Final    Comment: Performed at [facility], [address] [place], [place] [address]  Resp Panel by RT-PCR (Flu A&B, Covid) Nasopharyngeal Swab     Status: None   Collection Time: 11/22/20  9:45 PM   Specimen: Nasopharyngeal Swab; Nasopharyngeal(NP) swabs in vial transport medium  Result Value Ref Range Status   SARS Coronavirus 2 by RT PCR NEGATIVE NEGATIVE Final    Comment: (NOTE) SARS-CoV-2 target nucleic acids are NOT DETECTED.  The SARS-CoV-2 RNA is generally detectable in upper respiratory specimens during the acute phase of infection. The lowest concentration of SARS-CoV-2 viral copies this assay can detect is 138 copies/mL. A negative result does not preclude SARS-Cov-2 infection and should not be used as the sole basis for treatment or other patient management decisions. A negative result may occur with  improper specimen collection/handling, submission of specimen other than nasopharyngeal swab, presence of viral mutation(s) within the areas targeted by this assay, and inadequate number of viral copies(<138 copies/mL). A negative result must be combined with clinical observations, patient history, and epidemiological information. The expected result is Negative.  Fact Sheet for Patients:  EntrepreneurPulse.com.au  Fact Sheet for Healthcare Providers:  IncredibleEmployment.be  This test is no t yet approved or cleared by the [place]  States FDA and  has been authorized for detection and/or diagnosis of SARS-CoV-2 by FDA under an Emergency Use Authorization (EUA). This EUA will remain  in effect (meaning this test can be  used) for the duration of the COVID-19 declaration under Section 564(b)(1) of the Act, 21 U.S.C.section 360bbb-3(b)(1), unless the authorization is terminated  or revoked sooner.       Influenza A by PCR NEGATIVE NEGATIVE Final   Influenza B by PCR NEGATIVE NEGATIVE Final    Comment: (NOTE) The Xpert Xpress SARS-CoV-2/FLU/RSV plus assay is intended as an aid in the diagnosis of influenza from Nasopharyngeal swab specimens and should not be used as a sole basis for treatment. Nasal washings and aspirates are unacceptable for Xpert Xpress SARS-CoV-2/FLU/RSV testing.  Fact Sheet for Patients: EntrepreneurPulse.com.au  Fact Sheet for Healthcare Providers: IncredibleEmployment.be  This test is not yet approved or cleared by the [place] FDA and has been authorized for detection and/or diagnosis of SARS-CoV-2 by FDA under an Emergency Use Authorization (EUA). This EUA will remain in effect (meaning this test can be used) for the duration of the COVID-19 declaration under Section 564(b)(1) of the Act, 21 U.S.C. section 360bbb-3(b)(1), unless the authorization is terminated or revoked.  Performed at [facility], [address] [place], [place] [address]     Procedures and diagnostic studies:  US Venous Img Lower Bilateral (DVT)  Result Date: 11/22/2020 CLINICAL DATA:  Bilateral leg edema. EXAM: BILATERAL LOWER EXTREMITY VENOUS DOPPLER ULTRASOUND TECHNIQUE: Gray-scale sonography with compression, as well as color and duplex ultrasound, were performed to evaluate the deep venous system(s) from the level of the common femoral vein through the popliteal and proximal calf veins. COMPARISON:  None. FINDINGS: VENOUS Normal compressibility of the BILATERAL common femoral, superficial femoral, and popliteal veins, as well as the BILATERAL visualized calf veins. Visualized portions of profunda femoral vein and great saphenous vein unremarkable.  No filling defects to suggest DVT on grayscale or color Doppler imaging. Doppler waveforms show normal direction of venous flow, normal respiratory plasticity and response to augmentation. Limited views of the contralateral common femoral vein are unremarkable. OTHER A 5.1 cm x 1.2 cmx 2.1 cm anechoic and hypoechoic structure is seen within the soft tissues of the popliteal fossa on the left. No abnormal flow is noted within this region on color Doppler evaluation. Limitations: none IMPRESSION: 1. No evidence of DVT within the RIGHT or LEFT lower extremity. 2. Left Baker cyst. Electronically Signed   By: Virgina Norfolk M.D.   On: 11/22/2020 22:30   US Venous Img Upper Uni Left  Result Date: 11/22/2020 CLINICAL DATA:  Left upper extremity pain. EXAM: LEFT UPPER EXTREMITY VENOUS DOPPLER ULTRASOUND TECHNIQUE: Gray-scale sonography with graded compression, as well as color Doppler and duplex ultrasound were performed to evaluate the upper extremity deep venous system from the level of the subclavian vein and including the jugular, axillary, basilic, radial, ulnar and upper cephalic vein. Spectral Doppler was utilized to evaluate flow at rest and with distal augmentation maneuvers. COMPARISON:  None. FINDINGS: Contralateral Subclavian Vein: Respiratory phasicity is normal and symmetric with the symptomatic side. No evidence of thrombus. Normal compressibility. Internal Jugular Vein: No evidence of thrombus. Normal compressibility, respiratory phasicity and response to augmentation. Subclavian Vein: No evidence of thrombus. Normal compressibility, respiratory phasicity and response to augmentation. Axillary Vein: No evidence of thrombus. Normal compressibility, respiratory phasicity and response to augmentation. Cephalic Vein: No evidence of thrombus. Normal compressibility, respiratory phasicity and response to augmentation. Basilic Vein:  No evidence of thrombus. Normal compressibility, respiratory phasicity and  response to augmentation. Brachial Veins: No evidence of thrombus. Normal compressibility, respiratory phasicity and response to augmentation. Radial Veins: No evidence of thrombus. Normal compressibility, respiratory phasicity and response to augmentation. Ulnar Veins: No evidence of thrombus. Normal compressibility, respiratory phasicity and response to augmentation. Venous Reflux:  None visualized. Other Findings: It should be noted that the study is limited secondary to edema and the patient's body habitus. IMPRESSION: No evidence of DVT within the LEFT upper extremity. Electronically Signed   By: Virgina Norfolk M.D.   On: 11/22/2020 19:41   DG Chest Port 1 View  Result Date: 11/22/2020 CLINICAL DATA:  Sepsis, diarrhea, weakness for 3 days EXAM: PORTABLE CHEST 1 VIEW COMPARISON:  10/17/2020 FINDINGS: Single frontal view of the chest demonstrates stable dual lead pacer. Cardiac silhouette is unremarkable. No airspace disease, effusion, or pneumothorax. No acute bony abnormalities. IMPRESSION: 1. Stable chest, no acute process. Electronically Signed   By: Randa Ngo M.D.   On: 11/22/2020 18:18   DG Humerus Left  Result Date: 11/22/2020 CLINICAL DATA:  Left humeral pain EXAM: LEFT HUMERUS - 2+ VIEW COMPARISON:  None. FINDINGS: Frontal and lateral views of the left humerus demonstrates no fractures. Alignment of the left elbow and shoulder is anatomic. Mild spurring of the acromion process. Pacer overlies left chest. Visualized portions of the left chest are clear. IMPRESSION: 1. Unremarkable left humerus. Electronically Signed   By: Randa Ngo M.D.   On: 11/22/2020 18:16               LOS: 0 days   Eboney Claybrook  [facility]   Pager on [email or web address]. If 7PM-7AM, please contact night-coverage at [email or web address]     11/23/2020, 2:28 PM

## 2020-11-23 NOTE — ED Notes (Signed)
Informed RN bed assigned 

## 2020-11-23 NOTE — ED Notes (Signed)
This EDT obtained CBG of 80 at this time. RN made aware.

## 2020-11-23 NOTE — ED Notes (Signed)
Pt with bg of 57, pt given juice with sugar, pt tolerated well. Will recheck bg in one hour.

## 2020-11-23 NOTE — ED Notes (Signed)
Pharmacy messaged for missing IVF

## 2020-11-23 NOTE — ED Notes (Addendum)
Pt incontinent of urine and stool, pt cleaned and bedding changed

## 2020-11-23 NOTE — ED Notes (Signed)
Unable to draw pt's labs, lab called to collect labs

## 2020-11-23 NOTE — Progress Notes (Addendum)
VAST consulted to obtain IV access as current access in right [facility] leaking. Pt's left arm is restricted d/t breast cancer and lymphadema. VAST RN was able to place a 22G 2.5 inch catheter utilizing ultrasound. However, no other vessels were noted that would be appropriate for IV placement. [place] physician sticky note in place.

## 2020-11-23 NOTE — Evaluation (Addendum)
Occupational Therapy Evaluation Patient Details Name: Regina [name] MRN: [id number] DOB: [birth year] Today's Date: 11/23/2020    History of Present Illness Regina [name] is a 87 y.o. female with medical history significant for CAD s/p PCI 2009, PAF not on anticoagulation due to history of GI bleed, SSS s/p PPM, CKD stage III, IDT2DM, hypothyroidism, hypertension, hyperlipidemia, and morbid obesity who presents to the ED for evaluation of generalized weakness and loose stools. She reports a recent urinary incontinence with dysuria. She is having pain in her back and left arm. She reports new swelling in both of her lower extremities.  She denies any nausea, vomiting, abdominal pain, chest pain, or dyspnea.   Clinical Impression   Given pt's low BP, K+, and BG, therapist seeks and receives clearance from RN to evaluate pt. [name] presents today with generalized weakness and reduced endurance, and with very limited functional mobility. She states that she left [facility] two days ago to return home, but was unable to take care of herself once she got home -- she could not get to bathroom, could not feed herself adequately, and had 3 falls in 2 days. In ED this morning, pt is very weak, requiring Max/Total A to come into sitting, unable to come into standing, and set-up + MinA required for self-feeding/drinking. Generalized weakness in all 4 extremities, limited ROM in L UE, greatly reduced sensation in R hand, +2 edema in b/l LE, and 7/10 pain in back and L UE. Although pt was eager to leave [facility], she now recognizes that she is unsafe at home alone. She has a home care attendant 2 hours each morning, but states that she also needs someone in evenings but has not been able to find available staffing. Pt reports that daughter is currently out looking at ALF in the area. In pt's present condition, however, this therapist doubts that an ALF would be able to provide the level of care this pt  requires. Recommend ongoing OT while pt is hospitalized. Post DC, pt will likely require SNF-level care. Pt states that she does not want to go back to [facility] or to [facility].     Follow Up Recommendations  SNF    Equipment Recommendations  None recommended by OT    Recommendations for Other Services       Precautions / Restrictions Precautions Precautions: Fall Restrictions Weight Bearing Restrictions: No      Mobility Bed Mobility Overal bed mobility: Needs Assistance Bed Mobility: Supine to Sit;Sit to Supine     Supine to sit: Total assist Sit to supine: Total assist   General bed mobility comments: Pt requires Total A -- states she is "too weak" to assist in any way in bed mobility    Transfers Overall transfer level: Needs assistance               General transfer comment: not attempted    Balance Overall balance assessment: Needs assistance Sitting-balance support: Bilateral upper extremity supported Sitting balance-Leahy Scale: Poor       Standing balance-Leahy Scale: Zero Standing balance comment: pt declines standing, saying she is "too weak"                           ADL either performed or assessed with clinical judgement   ADL Overall ADL's : Needs assistance/impaired Eating/Feeding: Set up;Minimal assistance;Sitting;Bed level Eating/Feeding Details (indicate cue type and reason): pt unable to open packets. Uses  spoon only, 2/2 hand tremor                 Lower Body Dressing: Total assistance               Functional mobility during ADLs: Maximal assistance       Vision Baseline Vision/History: Legally blind Patient Visual Report: No change from baseline       Perception     Praxis      Pertinent Vitals/Pain Pain Assessment: Faces Faces Pain Scale: Hurts even more Pain Location: back, L UE/shoulder Pain Intervention(s): Limited activity within patient's tolerance;Patient requesting pain meds-RN  notified;Monitored during session     Hand Dominance Right   Extremity/Trunk Assessment Upper Extremity Assessment Upper Extremity Assessment: Generalized weakness   Lower Extremity Assessment Lower Extremity Assessment: Generalized weakness       Communication Communication Communication: HOH   Cognition Arousal/Alertness: Awake/alert Behavior During Therapy: Anxious;WFL for tasks assessed/performed Overall Cognitive Status: Within Functional Limits for tasks assessed                                 General Comments: oriented to self, time, place, situation   General Comments  +2 pitting edema, b/l LE. Extensive bruising, b/l UE.    Exercises Total Joint Exercises Ankle Circles/Pumps: AROM;10 reps;Both Quad Sets: AROM;5 reps;Both Other Exercises Other Exercises: Educ re: role of OT, importance of OOB movement, d/c recs, falls prevention   Shoulder Instructions      Home Living Family/patient expects to be discharged to:: Private residence Living Arrangements: Alone Available Help at Discharge: Personal care attendant Type of Home: House Home Access: Ramped entrance     Home Layout: One level     Bathroom Shower/Tub: Teacher, early years/pre: Standard     Home Equipment: Environmental consultant - 4 wheels          Prior Functioning/Environment Level of Independence: Needs assistance  Gait / Transfers Assistance Needed: household AMB only; modI with dressing (physical unable to open drawers or get things out of closet); can sponge bath; Aides keeps BR supplied with wipe, toilet tissue, pullups ADL's / Homemaking Assistance Needed: DTR brings groceries, pays bills, opens cans/containers, so that pt can warm food in microwave -- pt does not cook anymore Communication / Swallowing Assistance Needed: no teeth - eats only soft/pureed foods Comments: has an aide in the home for 2 hours each [place] Mon-Fri, to help with housework, bathing, medications         OT Problem List: Decreased strength;Decreased range of motion;Decreased activity tolerance;Impaired balance (sitting and/or standing);Pain;Impaired sensation;Impaired UE functional use;Obesity;Increased edema;Impaired vision/perception;Decreased coordination      OT Treatment/Interventions: Self-care/ADL training;Patient/family education;Therapeutic exercise;Balance training;Neuromuscular education;Therapeutic activities;DME and/or AE instruction    OT Goals(Current goals can be found in the care plan section) Acute Rehab OT Goals Patient Stated Goal: to feel better OT Goal Formulation: With patient Time For Goal Achievement: 12/07/20 Potential to Achieve Goals: Good  OT Frequency: Min 1X/week   Barriers to D/C: Decreased caregiver support          Co-evaluation              AM-PAC OT "6 Clicks" Daily Activity     Outcome Measure Help from another person eating meals?: A Little Help from another person taking care of personal grooming?: A Lot Help from another person toileting, which includes using toliet, bedpan, or urinal?: A Lot Help  from another person bathing (including washing, rinsing, drying)?: A Lot Help from another person to put on and taking off regular upper body clothing?: A Lot Help from another person to put on and taking off regular lower body clothing?: A Lot 6 Click Score: 13   End of Session Nurse Communication: Patient requests pain meds  Activity Tolerance: Patient limited by lethargy Patient left: in bed;with call bell/phone within reach;with nursing/sitter in room  OT Visit Diagnosis: Unsteadiness on feet (R26.81);Muscle weakness (generalized) (M62.81);History of falling (Z91.81);Repeated falls (R29.6)                Time: 1735-6701 OT Time Calculation (min): 40 min Charges:  OT General Charges $OT Visit: 1 Visit OT Evaluation $OT Eval Moderate Complexity: 1 Mod OT Treatments $Self Care/Home Management : 38-52 mins  Josiah Lobo, PhD,  MS, OTR/L 11/23/20, 10:28 AM

## 2020-11-24 LAB — BASIC METABOLIC PANEL
Anion gap: 9 (ref 5–15)
BUN: 11 mg/dL (ref 8–23)
CO2: 23 mmol/L (ref 22–32)
Calcium: 7.8 mg/dL — ABNORMAL LOW (ref 8.9–10.3)
Chloride: 105 mmol/L (ref 98–111)
Creatinine, Ser: 1.2 mg/dL — ABNORMAL HIGH (ref 0.44–1.00)
GFR, Estimated: 44 mL/min — ABNORMAL LOW (ref >60)
Glucose, Bld: 150 mg/dL — ABNORMAL HIGH (ref 70–99)
Potassium: 3.9 mmol/L (ref 3.5–5.1)
Sodium: 137 mmol/L (ref 135–145)

## 2020-11-24 LAB — CBC WITH DIFFERENTIAL/PLATELET
Abs Immature Granulocytes: 0.02 10*3/uL (ref 0.00–0.07)
Basophils Absolute: 0 10*3/uL (ref 0.0–0.1)
Basophils Relative: 1 %
Eosinophils Absolute: 0.2 10*3/uL (ref 0.0–0.5)
Eosinophils Relative: 4 %
HCT: 28.9 % — ABNORMAL LOW (ref 36.0–46.0)
Hemoglobin: 9.3 g/dL — ABNORMAL LOW (ref 12.0–15.0)
Immature Granulocytes: 0 %
Lymphocytes Relative: 22 %
Lymphs Abs: 1.4 10*3/uL (ref 0.7–4.0)
MCH: 29.2 pg (ref 26.0–34.0)
MCHC: 32.2 g/dL (ref 30.0–36.0)
MCV: 90.6 fL (ref 80.0–100.0)
Monocytes Absolute: 0.7 10*3/uL (ref 0.1–1.0)
Monocytes Relative: 11 %
Neutro Abs: 3.8 10*3/uL (ref 1.7–7.7)
Neutrophils Relative %: 62 %
Platelets: 211 10*3/uL (ref 150–400)
RBC: 3.19 MIL/uL — ABNORMAL LOW (ref 3.87–5.11)
RDW: 14.5 % (ref 11.5–15.5)
WBC: 6.2 10*3/uL (ref 4.0–10.5)
nRBC: 0 % (ref 0.0–0.2)

## 2020-11-24 LAB — GLUCOSE, CAPILLARY
Glucose-Capillary: 175 mg/dL — ABNORMAL HIGH (ref 70–99)
Glucose-Capillary: 192 mg/dL — ABNORMAL HIGH (ref 70–99)
Glucose-Capillary: 340 mg/dL — ABNORMAL HIGH (ref 70–99)

## 2020-11-24 LAB — MAGNESIUM: Magnesium: 1.8 mg/dL (ref 1.7–2.4)

## 2020-11-24 MED ORDER — PARICALCITOL 1 MCG PO CAPS
1.0000 ug | ORAL_CAPSULE | ORAL | Status: DC
  Filled 2020-11-24: qty 1

## 2020-11-24 MED ORDER — PARICALCITOL 1 MCG PO CAPS: 1.0000 ug | ORAL_CAPSULE | ORAL | Status: DC

## 2020-11-24 MED ORDER — LIDOCAINE 5 % EX PTCH
1.0000 | MEDICATED_PATCH | Freq: Every day | CUTANEOUS | Status: DC
  Administered 2020-11-24 – 2020-11-29 (×7): 1 via TRANSDERMAL
  Filled 2020-11-24 (×8): qty 1

## 2020-11-24 NOTE — Progress Notes (Addendum)
Progress Note    Regina [name]  VOH:[id number] DOB: October 03, 1933  DOA: 11/22/2020 PCP: Donnie Coffin, MD      Brief Narrative:    Medical records reviewed and are as summarized below:  Regina [name] is a 87 y.o. female       Assessment/Plan:   Principal Problem:   Generalized weakness Active Problems:   Acute lower UTI   Hypoglycemia   Insulin dependent type 2 diabetes mellitus ([place])   Hypertension associated with diabetes ([place])   Paroxysmal atrial fibrillation (HCC)   CKD (chronic kidney disease), stage III ([place])   Hyperlipidemia associated with type 2 diabetes mellitus ([place])   CAD (coronary artery disease)   Hypothyroidism   Hypotension   Body mass index is 45.91 kg/m.    Probable acute UTI/bacteriuria: Urine culture showed Klebsiella pneumoniae.  Sensitivity report pending.  She said she just got treated for UTI.  Chronic diarrhea: Unknown if patient has IBS.  Imodium as needed for diarrhea.  Hypotension: BP still soft.  Hold antihypertensives.  Hypokalemia: Improved.  IDDM, hypoglycemia: Hemoglobin A1c was 5.6 about a month ago.  Insulin may need to be held at discharge because of chronic diarrhea and recurrent hypoglycemia  Paroxysmal atrial fibrillation, sick sinus syndrome s/p permanent pacemaker: Continue aspirin. CHA2DS2-VASC score is 6 but she is not on anticoagulation because of history of GI bleeding.  Hyperlipidemia, CAD s/p PCI 2009: Continue aspirin and rosuvastatin  Generalized weakness: Continue PT and OT.  Patient may need SNF at discharge.  CKD stage IIIa: Creatinine stable.  Recent admission for acute blood loss anemia due to upper GI bleed from esophageal ulcer..  Continue Protonix   Diet Order            DIET SOFT Room service appropriate? Yes; Fluid consistency: Thin  Diet effective now                    Consultants:  None  Procedures:  None    Medications:   . aspirin EC  81 mg Oral Daily  .  levothyroxine  75 mcg Oral Daily  . lidocaine  1 patch Transdermal Daily  . pantoprazole  40 mg Oral BID AC  . potassium chloride  40 mEq Oral Once  . ranolazine  500 mg Oral BID  . rosuvastatin  10 mg Oral Daily  . sodium chloride flush  3 mL Intravenous Q12H  . cyanocobalamin  1,000 mcg Oral Daily   Continuous Infusions: . cefTRIAXone (ROCEPHIN)  IV Stopped (11/23/20 2112)     Anti-infectives (From admission, onward)   Start     Dose/Rate Route Frequency Ordered Stop   11/23/20 2000  cefTRIAXone (ROCEPHIN) 1 g in sodium chloride 0.9 % 100 mL IVPB        1 g 200 mL/hr over 30 Minutes Intravenous Every 24 hours 11/22/20 2055     11/22/20 2000  cefTRIAXone (ROCEPHIN) 1 g in sodium chloride 0.9 % 100 mL IVPB        1 g 200 mL/hr over 30 Minutes Intravenous  Once 11/22/20 1952 11/23/20 0322             Family Communication/Anticipated D/C date and plan/Code Status   DVT prophylaxis: SCDs Start: 11/22/20 2051     Code Status: DNR  Family Communication: None Disposition Plan:    Status is: Observation  The patient will require care spanning > 2 midnights and should be moved to inpatient because: IV treatments  appropriate due to intensity of illness or inability to take PO  Dispo: The patient is from: Home              Anticipated d/c is to: SNF              Patient currently is not medically stable to d/c.   Difficult to place patient No           Subjective:   C/o low back pain.  She is frustrated about her current low blood glucose and diarrhea.  Her nurse and physical therapist were at the bedside.  Patient says she had diarrhea this morning but her nurse sais she only had a small smear of stool.  She said nobody was helping her.  She only has 2 hours of help at home.  She said her daughter, [name], is not very helpful and she thinks she does not want to have anything to do with her.  She said she gets her groceries and pays her bills.   Objective:     Vitals:   11/24/20 0049 11/24/20 0500 11/24/20 0554 11/24/20 0844  BP: (!) 104/50  (!) 95/46 (!) 100/41  Pulse: 90  78 89  Resp:   16 16  Temp: 98 F (36.7 C)  98 F (36.7 C) 99.3 F (37.4 C)  TempSrc: Oral  Oral   SpO2: 99%  97% 100%  Weight:  103.1 kg     No data found.   Intake/Output Summary (Last 24 hours) at 11/24/2020 1055 Last data filed at 11/24/2020 1024 Gross per 24 hour  Intake 3802.71 ml  Output --  Net 3802.71 ml   Filed Weights   11/24/20 0500  Weight: 103.1 kg    Exam:  GEN: NAD SKIN: Warm and dry EYES: EOMI ENT: MMM CV: RRR PULM: CTA B ABD: soft, obese, NT, +BS CNS: AAO x 3, non focal EXT: B/l leg edema, no tenderness MSK: Paraspinal lumbar tenderness PSYCH: She was very emotional and tearful.          Data Reviewed:   I have personally reviewed following labs and imaging studies:  Labs: Labs show the following:   Basic Metabolic Panel: Recent Labs  Lab 11/22/20 1738 11/23/20 0821 11/23/20 0842 11/23/20 1441 11/24/20 0545  NA 139  --  137  --  137  K 2.9*  --  3.0* 4.6 3.9  CL 102  --  101  --  105  CO2 26  --  25  --  23  GLUCOSE 53*  --  81  --  150*  BUN 14  --  12  --  11  CREATININE 1.10*  --  1.14*  --  1.20*  CALCIUM 8.7*  --  8.0*  --  7.8*  MG 1.6*  --  2.1  --  1.8  PHOS  --  3.4  --   --   --    GFR Estimated Creatinine Clearance: 36.4 mL/min (A) (by C-G formula based on SCr of 1.2 mg/dL (H)). Liver Function Tests: Recent Labs  Lab 11/22/20 1738  AST 32  ALT 17  ALKPHOS 80  BILITOT 0.5  PROT 6.4*  ALBUMIN 3.2*   Recent Labs  Lab 11/22/20 1738  LIPASE 21   No results for input(s): AMMONIA in the last 168 hours. Coagulation profile Recent Labs  Lab 11/22/20 1738  INR 1.2    CBC: Recent Labs  Lab 11/22/20 1738 11/23/20 0842 11/24/20 0545  WBC 7.8  9.3 6.2  NEUTROABS 5.8  --  3.8  HGB 11.5* 11.1* 9.3*  HCT 35.0* 34.7* 28.9*  MCV 90.4 93.5 90.6  PLT 272 236 211   Cardiac  Enzymes: No results for input(s): CKTOTAL, CKMB, CKMBINDEX, TROPONINI in the last 168 hours. BNP (last 3 results) No results for input(s): PROBNP in the last 8760 hours. CBG: Recent Labs  Lab 11/23/20 0742 11/23/20 0852 11/23/20 1621 11/23/20 2145 11/24/20 0016  GLUCAP 57* 80 124* 202* 175*   D-Dimer: No results for input(s): DDIMER in the last 72 hours. Hgb A1c: No results for input(s): HGBA1C in the last 72 hours. Lipid Profile: No results for input(s): CHOL, HDL, LDLCALC, TRIG, CHOLHDL, LDLDIRECT in the last 72 hours. Thyroid function studies: Recent Labs    11/22/20 1738  TSH 2.618   Anemia work up: No results for input(s): VITAMINB12, FOLATE, FERRITIN, TIBC, IRON, RETICCTPCT in the last 72 hours. Sepsis Labs: Recent Labs  Lab 11/22/20 1736 11/22/20 1738 11/23/20 0842 11/24/20 0545  WBC  --  7.8 9.3 6.2  LATICACIDVEN 2.4*  --  2.4*  --     Microbiology Recent Results (from the past 240 hour(s))  Blood culture (routine single)     Status: None (Preliminary result)   Collection Time: 11/22/20  5:36 PM   Specimen: BLOOD  Result Value Ref Range Status   Specimen Description BLOOD BLOOD RIGHT FOREARM  Final   Special Requests   Final    BOTTLES DRAWN AEROBIC AND ANAEROBIC Blood Culture adequate volume   Culture   Final    NO GROWTH 2 DAYS Performed at [facility], [address] [place], [place] [address]    Report Status PENDING  Incomplete  Urine culture     Status: Abnormal (Preliminary result)   Collection Time: 11/22/20  6:40 PM   Specimen: In/Out Cath Urine  Result Value Ref Range Status   Specimen Description   Final    IN/OUT CATH URINE Performed at [facility], [address] [place], [place] [address]    Special Requests   Final    NONE Performed at [facility], [address] [place], [place] [address]    Culture (A)  Final    >=100,000 COLONIES/mL KLEBSIELLA PNEUMONIAE SUSCEPTIBILITIES TO  FOLLOW Performed at [facility], [place] [address], [place], [place] [address]    Report Status PENDING  Incomplete  C Difficile Quick Screen w PCR reflex     Status: None   Collection Time: 11/22/20  6:42 PM   Specimen: Urine, Clean Catch; Stool  Result Value Ref Range Status   C Diff antigen NEGATIVE NEGATIVE Final   C Diff toxin NEGATIVE NEGATIVE Final   C Diff interpretation No C. difficile detected.  Final    Comment: Performed at [facility], [address] [place], [place] [address]  Gastrointestinal Panel by PCR , Stool     Status: None   Collection Time: 11/22/20  6:43 PM   Specimen: Urine, Clean Catch; Stool  Result Value Ref Range Status   Campylobacter species NOT DETECTED NOT DETECTED Final   Plesimonas shigelloides NOT DETECTED NOT DETECTED Final   Salmonella species NOT DETECTED NOT DETECTED Final   Yersinia enterocolitica NOT DETECTED NOT DETECTED Final   Vibrio species NOT DETECTED NOT DETECTED Final   Vibrio cholerae NOT DETECTED NOT DETECTED Final   Enteroaggregative E coli (EAEC) NOT DETECTED NOT DETECTED Final   Enteropathogenic E coli (EPEC) NOT DETECTED NOT DETECTED  Final   Enterotoxigenic E coli (ETEC) NOT DETECTED NOT DETECTED Final   Shiga like toxin producing E coli (STEC) NOT DETECTED NOT DETECTED Final   Shigella/Enteroinvasive E coli (EIEC) NOT DETECTED NOT DETECTED Final   Cryptosporidium NOT DETECTED NOT DETECTED Final   Cyclospora cayetanensis NOT DETECTED NOT DETECTED Final   Entamoeba histolytica NOT DETECTED NOT DETECTED Final   Giardia lamblia NOT DETECTED NOT DETECTED Final   Adenovirus F40/41 NOT DETECTED NOT DETECTED Final   Astrovirus NOT DETECTED NOT DETECTED Final   Norovirus GI/GII NOT DETECTED NOT DETECTED Final   Rotavirus A NOT DETECTED NOT DETECTED Final   Sapovirus (I, II, IV, and V) NOT DETECTED NOT DETECTED Final    Comment: Performed at [facility], [address] [place], [place] [address]  Resp  Panel by RT-PCR (Flu A&B, Covid) Nasopharyngeal Swab     Status: None   Collection Time: 11/22/20  9:45 PM   Specimen: Nasopharyngeal Swab; Nasopharyngeal(NP) swabs in vial transport medium  Result Value Ref Range Status   SARS Coronavirus 2 by RT PCR NEGATIVE NEGATIVE Final    Comment: (NOTE) SARS-CoV-2 target nucleic acids are NOT DETECTED.  The SARS-CoV-2 RNA is generally detectable in upper respiratory specimens during the acute phase of infection. The lowest concentration of SARS-CoV-2 viral copies this assay can detect is 138 copies/mL. A negative result does not preclude SARS-Cov-2 infection and should not be used as the sole basis for treatment or other patient management decisions. A negative result may occur with  improper specimen collection/handling, submission of specimen other than nasopharyngeal swab, presence of viral mutation(s) within the areas targeted by this assay, and inadequate number of viral copies(<138 copies/mL). A negative result must be combined with clinical observations, patient history, and epidemiological information. The expected result is Negative.  Fact Sheet for Patients:  EntrepreneurPulse.com.au  Fact Sheet for Healthcare Providers:  IncredibleEmployment.be  This test is no t yet approved or cleared by the [place] FDA and  has been authorized for detection and/or diagnosis of SARS-CoV-2 by FDA under an Emergency Use Authorization (EUA). This EUA will remain  in effect (meaning this test can be used) for the duration of the COVID-19 declaration under Section 564(b)(1) of the Act, 21 U.S.C.section 360bbb-3(b)(1), unless the authorization is terminated  or revoked sooner.       Influenza A by PCR NEGATIVE NEGATIVE Final   Influenza B by PCR NEGATIVE NEGATIVE Final    Comment: (NOTE) The Xpert Xpress SARS-CoV-2/FLU/RSV plus assay is intended as an aid in the diagnosis of influenza from Nasopharyngeal  swab specimens and should not be used as a sole basis for treatment. Nasal washings and aspirates are unacceptable for Xpert Xpress SARS-CoV-2/FLU/RSV testing.  Fact Sheet for Patients: EntrepreneurPulse.com.au  Fact Sheet for Healthcare Providers: IncredibleEmployment.be  This test is not yet approved or cleared by the [place] FDA and has been authorized for detection and/or diagnosis of SARS-CoV-2 by FDA under an Emergency Use Authorization (EUA). This EUA will remain in effect (meaning this test can be used) for the duration of the COVID-19 declaration under Section 564(b)(1) of the Act, 21 U.S.C. section 360bbb-3(b)(1), unless the authorization is terminated or revoked.  Performed at [facility], [address] [place], [place] [address]     Procedures and diagnostic studies:  US Venous Img Lower Bilateral (DVT)  Result Date: 11/22/2020 CLINICAL DATA:  Bilateral leg edema. EXAM: BILATERAL LOWER EXTREMITY VENOUS DOPPLER ULTRASOUND TECHNIQUE: Gray-scale sonography with compression, as well  as color and duplex ultrasound, were performed to evaluate the deep venous system(s) from the level of the common femoral vein through the popliteal and proximal calf veins. COMPARISON:  None. FINDINGS: VENOUS Normal compressibility of the BILATERAL common femoral, superficial femoral, and popliteal veins, as well as the BILATERAL visualized calf veins. Visualized portions of profunda femoral vein and great saphenous vein unremarkable. No filling defects to suggest DVT on grayscale or color Doppler imaging. Doppler waveforms show normal direction of venous flow, normal respiratory plasticity and response to augmentation. Limited views of the contralateral common femoral vein are unremarkable. OTHER A 5.1 cm x 1.2 cmx 2.1 cm anechoic and hypoechoic structure is seen within the soft tissues of the popliteal fossa on the left. No abnormal flow is noted  within this region on color Doppler evaluation. Limitations: none IMPRESSION: 1. No evidence of DVT within the RIGHT or LEFT lower extremity. 2. Left Baker cyst. Electronically Signed   By: Virgina Norfolk M.D.   On: 11/22/2020 22:30   US Venous Img Upper Uni Left  Result Date: 11/22/2020 CLINICAL DATA:  Left upper extremity pain. EXAM: LEFT UPPER EXTREMITY VENOUS DOPPLER ULTRASOUND TECHNIQUE: Gray-scale sonography with graded compression, as well as color Doppler and duplex ultrasound were performed to evaluate the upper extremity deep venous system from the level of the subclavian vein and including the jugular, axillary, basilic, radial, ulnar and upper cephalic vein. Spectral Doppler was utilized to evaluate flow at rest and with distal augmentation maneuvers. COMPARISON:  None. FINDINGS: Contralateral Subclavian Vein: Respiratory phasicity is normal and symmetric with the symptomatic side. No evidence of thrombus. Normal compressibility. Internal Jugular Vein: No evidence of thrombus. Normal compressibility, respiratory phasicity and response to augmentation. Subclavian Vein: No evidence of thrombus. Normal compressibility, respiratory phasicity and response to augmentation. Axillary Vein: No evidence of thrombus. Normal compressibility, respiratory phasicity and response to augmentation. Cephalic Vein: No evidence of thrombus. Normal compressibility, respiratory phasicity and response to augmentation. Basilic Vein: No evidence of thrombus. Normal compressibility, respiratory phasicity and response to augmentation. Brachial Veins: No evidence of thrombus. Normal compressibility, respiratory phasicity and response to augmentation. Radial Veins: No evidence of thrombus. Normal compressibility, respiratory phasicity and response to augmentation. Ulnar Veins: No evidence of thrombus. Normal compressibility, respiratory phasicity and response to augmentation. Venous Reflux:  None visualized. Other Findings: It  should be noted that the study is limited secondary to edema and the patient's body habitus. IMPRESSION: No evidence of DVT within the LEFT upper extremity. Electronically Signed   By: Virgina Norfolk M.D.   On: 11/22/2020 19:41   DG Chest Port 1 View  Result Date: 11/22/2020 CLINICAL DATA:  Sepsis, diarrhea, weakness for 3 days EXAM: PORTABLE CHEST 1 VIEW COMPARISON:  10/17/2020 FINDINGS: Single frontal view of the chest demonstrates stable dual lead pacer. Cardiac silhouette is unremarkable. No airspace disease, effusion, or pneumothorax. No acute bony abnormalities. IMPRESSION: 1. Stable chest, no acute process. Electronically Signed   By: Randa Ngo M.D.   On: 11/22/2020 18:18   DG Humerus Left  Result Date: 11/22/2020 CLINICAL DATA:  Left humeral pain EXAM: LEFT HUMERUS - 2+ VIEW COMPARISON:  None. FINDINGS: Frontal and lateral views of the left humerus demonstrates no fractures. Alignment of the left elbow and shoulder is anatomic. Mild spurring of the acromion process. Pacer overlies left chest. Visualized portions of the left chest are clear. IMPRESSION: 1. Unremarkable left humerus. Electronically Signed   By: Randa Ngo M.D.   On: 11/22/2020 18:16  LOS: 1 [place]   Charlynn Salih  [facility]   Pager on [email or web address]. If 7PM-7AM, please contact night-coverage at [email or web address]     11/24/2020, 10:55 AM

## 2020-11-24 NOTE — NC FL2 (Signed)
[place] LEVEL OF CARE SCREENING TOOL     IDENTIFICATION  Patient Name: Regina [name] Birthdate: September 17, 1933 Sex: female Admission Date (Current Location): 11/22/2020  [place] and [place] Number:  Engineering geologist and Address:  [facility], [address], [place], [place] [address]      Provider Number: [id number]  Attending Physician Name and Address:  Jennye Boroughs, MD  Relative Name and Phone Number:  Mollie Germany (daughter) [phone]    Current Level of Care: Hospital Recommended Level of Care: [place] Prior Approval Number:    Date Approved/Denied:   PASRR Number: [id number]  Discharge Plan: SNF    Current Diagnoses: Patient Active Problem List   Diagnosis Date Noted  . Hypotension 11/23/2020  . Generalized weakness 11/22/2020  . CKD (chronic kidney disease), stage III ([place]) 11/22/2020  . Hyperlipidemia associated with type 2 diabetes mellitus ([place]) 11/22/2020  . CAD (coronary artery disease)   . Hypothyroidism   . Acute upper GI bleeding 10/17/2020  . Pacemaker 12/16/2019  . History of cancer of left breast 12/16/2019  . Sick sinus syndrome due to SA node dysfunction ([place]) 12/16/2019  . Pressure injury of skin 09/26/2016  . C. difficile colitis 09/25/2016  . Sepsis ([place]) 08/28/2016  . Cellulitis 06/26/2016  . Acute lower UTI 02/20/2015  . Elevated troponin 02/20/2015  . Hypoglycemia 02/20/2015  . Insulin dependent type 2 diabetes mellitus ([place]) 02/20/2015  . Hypertension associated with diabetes ([place]) 02/20/2015  . HLD (hyperlipidemia) 02/20/2015  . Paroxysmal atrial fibrillation ([place]) 02/20/2015  . GERD (gastroesophageal reflux disease) 02/20/2015    Orientation RESPIRATION BLADDER Height & Weight     Self,Time,Situation,Place  Normal Incontinent Weight: 103.1 kg Height:     BEHAVIORAL SYMPTOMS/MOOD NEUROLOGICAL BOWEL NUTRITION STATUS      Incontinent Diet (soft)  AMBULATORY  STATUS COMMUNICATION OF NEEDS Skin   Extensive Assist Verbally Other (Comment) (MASD to skin folds)                       Personal Care Assistance Level of Assistance  Bathing,Feeding,Dressing Bathing Assistance: Maximum assistance Feeding assistance: Limited assistance Dressing Assistance: Maximum assistance     Functional Limitations Info  Sight,Hearing,Speech Sight Info: Impaired Hearing Info: Adequate Speech Info: Adequate    SPECIAL CARE FACTORS FREQUENCY  PT (By licensed PT),OT (By licensed OT)     PT Frequency: 5 times per week OT Frequency: 5 times per week            Contractures Contractures Info: Not present    Additional Factors Info  Code Status,Allergies Code Status Info: DNR Allergies Info: NKA           Current Medications (11/24/2020):  This is the current hospital active medication list Current Facility-Administered Medications  Medication Dose Route Frequency Provider Last Rate Last Admin  . acetaminophen (TYLENOL) tablet 650 mg  650 mg Oral Q6H PRN Lenore Cordia, MD   650 mg at 11/24/20 0603   Or  . acetaminophen (TYLENOL) suppository 650 mg  650 mg Rectal Q6H PRN Lenore Cordia, MD      . aspirin EC tablet 81 mg  81 mg Oral Daily Lenore Cordia, MD   81 mg at 11/24/20 0904  . cefTRIAXone (ROCEPHIN) 1 g in sodium chloride 0.9 % 100 mL IVPB  1 g Intravenous Q24H Lenore Cordia, MD   Stopped at 11/23/20 2112  . dextrose 50 % solution 50 mL  1 ampule Intravenous PRN Lenore Cordia, MD   50 mL at 11/22/20 2147  . levothyroxine (SYNTHROID) tablet 75 mcg  75 mcg Oral Daily Lenore Cordia, MD   75 mcg at 11/24/20 0527  . lidocaine (LIDODERM) 5 % 1 patch  1 patch Transdermal Daily Mansy, Jan A, MD   1 patch at 11/24/20 0157  . loperamide (IMODIUM) capsule 2 mg  2 mg Oral PRN Dorothe Pea, RPH   2 mg at 11/23/20 1737  . ondansetron (ZOFRAN) tablet 4 mg  4 mg Oral Q6H PRN Lenore Cordia, MD       Or  . ondansetron (ZOFRAN) injection 4 mg   4 mg Intravenous Q6H PRN Zada Finders R, MD      . pantoprazole (PROTONIX) EC tablet 40 mg  40 mg Oral BID AC Lenore Cordia, MD   40 mg at 11/24/20 0903  . potassium chloride (KLOR-CON) packet 40 mEq  40 mEq Oral Once Lenore Cordia, MD      . ranolazine (RANEXA) 12 hr tablet 500 mg  500 mg Oral BID Jennye Boroughs, MD   500 mg at 11/24/20 0914  . rosuvastatin (CRESTOR) tablet 10 mg  10 mg Oral Daily Lenore Cordia, MD   10 mg at 11/24/20 0903  . sodium chloride flush (NS) 0.9 % injection 3 mL  3 mL Intravenous Q12H Lenore Cordia, MD   3 mL at 11/24/20 0905  . vitamin B-12 (CYANOCOBALAMIN) tablet 1,000 mcg  1,000 mcg Oral Daily Lenore Cordia, MD   1,000 mcg at 11/24/20 8502     Discharge Medications: Please see discharge summary for a list of discharge medications.  Relevant Imaging Results:  Relevant Lab Results:   Additional Information SS#: [id number]. Not vaccinated.  Shelbie Hutching, RN

## 2020-11-24 NOTE — TOC Initial Note (Signed)
Transition of Care [facility]) - Initial/Assessment Note    Patient Details  Name: Regina [name] MRN: [id number] Date of Birth: Jan 09, 1934  Transition [facility]) CM/SW Contact:    Shelbie Hutching, RN Phone Number: 11/24/2020, 3:14 PM  Clinical Narrative:                 Patient is from home where she lives alone.  Patient recently discharged from [facility], patient checked herself out.  Patient went home on Monday and was back in the hospital on Wed.  Patient agrees to SNF and potentially LTC.  Patient's daughter, Regina [name], would like for patient to get long term care.  Patient has Medicaid. Patient does not want to go to [facility] or [facility].  Patient would like for RNCM to speak with patient's daughter about placement.  Regina [name] is leaning towards [facility].  RNCM will send bed request to [facility].  Holiday weekend patient will not be able to discharge until earliest Monday.  TOC will cont to follow.   Expected Discharge Plan: Skilled Nursing Facility Barriers to Discharge: Continued Medical Work up   Patient Goals and CMS Choice Patient states their goals for this hospitalization and ongoing recovery are:: Patient agrees to rehab and maybe LTC if needed- wants to go to a good place CMS Medicare.gov Compare Post Acute Care list provided to:: Patient Choice offered to / list presented to : [place]  Expected Discharge Plan and Services Expected Discharge Plan: [name]   Discharge Planning Services: CM Consult Post Acute Care Choice: [place] Living arrangements for the past 2 months: Single Family Home                 DME Arranged: N/A DME Agency: NA       HH Arranged: NA          Prior Living Arrangements/Services Living arrangements for the past 2 months: Single Family Home Lives with:: Self Patient language and need for interpreter reviewed:: Yes Do you feel safe going back to the place where you live?: No   no one  to help her at home  Need for Family Participation in Patient Care: Yes (Comment) (Frequent falls, weakness) Care giver support system in place?: Yes (comment) (daughter) Current home services: DME (walker, lift chair) Criminal Activity/Legal Involvement Pertinent to Current Situation/Hospitalization: No - Comment as needed  Activities of Daily Living Home Assistive Devices/Equipment: Walker (specify type),Shower chair without back ADL Screening (condition at time of admission) Patient's cognitive ability adequate to safely complete daily activities?: Yes Is the patient deaf or have difficulty hearing?: No Does the patient have difficulty seeing, even when wearing glasses/contacts?: Yes Does the patient have difficulty concentrating, remembering, or making decisions?: No Patient able to express need for assistance with ADLs?: Yes Does the patient have difficulty dressing or bathing?: Yes Independently performs ADLs?: No Communication: Independent Dressing (OT): Needs assistance Is this a change from baseline?: Pre-admission baseline Grooming: Needs assistance Is this a change from baseline?: Pre-admission baseline Feeding: Needs assistance Is this a change from baseline?: Pre-admission baseline Bathing: Needs assistance Is this a change from baseline?: Pre-admission baseline Toileting: Needs assistance Is this a change from baseline?: Pre-admission baseline In/Out Bed: Needs assistance Is this a change from baseline?: Pre-admission baseline Walks in Home: Needs assistance Is this a change from baseline?: Pre-admission baseline Does the patient have difficulty walking or climbing stairs?: Yes Weakness of Legs: Both Weakness of Arms/Hands: None  Permission Sought/Granted Permission  sought to share information with : Case Manager,Facility Contact Representative,Family Supports Permission granted to share information with : Yes, Verbal Permission Granted  Share Information with  NAME: Regina [name]  Permission granted to share info w AGENCY: SNF's  Permission granted to share info w Relationship: daughter     Emotional Assessment Appearance:: Appears stated age Attitude/Demeanor/Rapport: Engaged Affect (typically observed): Accepting Orientation: : Oriented to Self,Oriented to Place,Oriented to  Time,Oriented to Situation Alcohol / Substance Use: Not Applicable Psych Involvement: No (comment)  Admission diagnosis:  AF (paroxysmal atrial fibrillation) (HCC) [I48.0] Generalized weakness [R53.1] Urinary tract infection, acute [N39.0] Bilateral lower extremity edema [R60.0] Patient Active Problem List   Diagnosis Date Noted  . Hypotension 11/23/2020  . Generalized weakness 11/22/2020  . CKD (chronic kidney disease), stage III ([place]) 11/22/2020  . Hyperlipidemia associated with type 2 diabetes mellitus ([place]) 11/22/2020  . CAD (coronary artery disease)   . Hypothyroidism   . Acute upper GI bleeding 10/17/2020  . Pacemaker 12/16/2019  . History of cancer of left breast 12/16/2019  . Sick sinus syndrome due to SA node dysfunction ([place]) 12/16/2019  . Pressure injury of skin 09/26/2016  . C. difficile colitis 09/25/2016  . Sepsis ([place]) 08/28/2016  . Cellulitis 06/26/2016  . Acute lower UTI 02/20/2015  . Elevated troponin 02/20/2015  . Hypoglycemia 02/20/2015  . Insulin dependent type 2 diabetes mellitus ([place]) 02/20/2015  . Hypertension associated with diabetes ([place]) 02/20/2015  . HLD (hyperlipidemia) 02/20/2015  . Paroxysmal atrial fibrillation ([place]) 02/20/2015  . GERD (gastroesophageal reflux disease) 02/20/2015   PCP:  Donnie Coffin, MD Pharmacy:   [facility] [place], [place] - [address] [place] [address] Phone: [phone] Fax: [phone]  [place], [address], [address] [place], [address] [place] [address] Phone: [phone] Fax:  [place], [place] - [address] [place] [address] Phone: [phone] Fax: [phone]  [id number] [place], [place] - [address] [place] [address] Phone: [phone] Fax: [phone]     Social Determinants of Health (SDOH) Interventions    Readmission Risk Interventions No flowsheet data found.

## 2020-11-24 NOTE — Evaluation (Addendum)
Physical Therapy Evaluation Patient Details Name: Regina [name] MRN: [id number] DOB: June 06, 1934 Today's Date: 11/24/2020   History of Present Illness  Regina [name] is a 87 y.o. female with medical history significant for CAD s/p PCI 2009, PAF not on anticoagulation due to history of GI bleed, SSS s/p PPM, CKD stage III, IDT2DM, hypothyroidism, hypertension, hyperlipidemia, and morbid obesity who presents to the ED for evaluation of generalized weakness and loose stools. She reports a recent urinary incontinence with dysuria. She is having pain in her back and left arm. She reports new swelling in both of her lower extremities.  She denies any nausea, vomiting, abdominal pain, chest pain, or dyspnea.    Clinical Impression  Pt seen for PT evaluation with pt incontinent of small BM smear. Pt requires mod assist for rolling L<>R with cuing for hand placement and use of LE to assist with movement to allow PT & nurse to perform peri hygiene dependently. Pt anxious & irritable throughout session, continuously demanding and argumentative towards PT & nurse. PT asks pt what is bothering her when pt becomes tearful and states "I can't do this. I should've just taken a bunch of pills and ended it." with MD in room at the time & overhearing pt. Pt tearful with PT, MD, & nurse providing encouragement, and offering chaplain services, which pt declines. After MD assessment pt requires MAX encouragement/education to attempt sit<>stand, which pt ultimately does so with min assist & RW. Pt is able to progress to taking a few steps to recliner with RW & min assist. PT encouraged pt to attempt to get to [facility] with assistance from nursing staff & to sit up in recliner as much as possible. Pt would benefit from STR upon d/c to maximize independence with functional mobility & reduce fall risk prior to return home.  Addendum: after transferring to recliner, SPO2 >90% on room air, HR up to 128-129 bpm     Follow Up  Recommendations SNF    Equipment Recommendations  None recommended by PT    Recommendations for Other Services       Precautions / Restrictions Precautions Precautions: Fall Restrictions Weight Bearing Restrictions: No      Mobility  Bed Mobility Overal bed mobility: Needs Assistance Bed Mobility: Rolling;Supine to Sit Rolling: Mod assist   Supine to sit: Mod assist;HOB elevated     General bed mobility comments: cuing to use bed rails, attempt task before asking PT to lift her    Transfers Overall transfer level: Needs assistance   Transfers: Sit to/from Stand;Stand Pivot Transfers Sit to Stand: Min assist Stand pivot transfers: Min assist       General transfer comment: pt with poor awareness of hand placement as pt pulls up on RW with BUE (pt uses rollator at baseline)  Ambulation/Gait Ambulation/Gait assistance: Min assist Gait Distance (Feet): 2 Feet Assistive device: Rolling walker (2 wheeled) Gait Pattern/deviations: Decreased step length - left;Decreased step length - right;Decreased dorsiflexion - right;Shuffle Gait velocity: decreased   General Gait Details: steps to R to Physicist, medical    Modified Rankin (Stroke Patients Only)       Balance Overall balance assessment: Needs assistance Sitting-balance support: Single extremity supported;Feet unsupported Sitting balance-Leahy Scale: Good Sitting balance - Comments: supervision sitting EOB x 8 minutes without LOB noted   Standing balance support: Bilateral upper extremity supported Standing balance-Leahy Scale: Fair Standing balance comment: BUE  support on RW during standing                             Pertinent Vitals/Pain Pain Assessment: Faces Faces Pain Scale: Hurts little more Pain Location: back, LUE Pain Descriptors / Indicators: Discomfort Pain Intervention(s): Limited activity within patient's tolerance;Monitored during session  (notified nurse)    Home Living Family/patient expects to be discharged to:: Skilled nursing facility Living Arrangements: Alone Available Help at Discharge: Personal care attendant Type of Home: House Home Access: Ramped entrance     Home Layout: One level Home Equipment:  (rollator) Additional Comments: Personal care attendant 2 hours/[place], 5 days/week    Prior Function     Gait / Transfers Assistance Needed: [facility] was ambulating with rollator, living alone.  ADL's / Homemaking Assistance Needed: DTR brings groceries, pays bills, opens cans/containers, so that pt can warm food in microwave -- pt does not cook anymore  Comments: has an aide in the home for 2 hours each [place] Mon-Fri, to help with housework, bathing, medications     Hand Dominance        Extremity/Trunk Assessment   Upper Extremity Assessment Upper Extremity Assessment: Generalized weakness (LUE edema noted)    Lower Extremity Assessment Lower Extremity Assessment: Generalized weakness       Communication      Cognition Arousal/Alertness: Awake/alert Behavior During Therapy: Anxious (irritable) Overall Cognitive Status: Within Functional Limits for tasks assessed                                        General Comments      Exercises     Assessment/Plan    PT Assessment Patient needs continued PT services  PT Problem List Decreased strength;Decreased mobility;Decreased safety awareness;Obesity;Decreased knowledge of precautions;Decreased activity tolerance;Cardiopulmonary status limiting activity;Pain;Decreased balance;Decreased knowledge of use of DME       PT Treatment Interventions DME instruction;Therapeutic activities;Modalities;Gait training;Therapeutic exercise;Patient/family education;Stair training;Balance training;Wheelchair mobility training;Functional mobility training;Neuromuscular re-education;Manual techniques    PT Goals (Current goals  can be found in the Care Plan section)  Acute Rehab PT Goals Patient Stated Goal: to feel better PT Goal Formulation: With patient Time For Goal Achievement: 12/08/20 Potential to Achieve Goals: Fair    Frequency Min 2X/week   Barriers to discharge Decreased caregiver support lives alone    Co-evaluation               AM-PAC PT "6 Clicks" Mobility  Outcome Measure Help needed turning from your back to your side while in a flat bed without using bedrails?: A Lot Help needed moving from lying on your back to sitting on the side of a flat bed without using bedrails?: A Lot Help needed moving to and from a bed to a chair (including a wheelchair)?: A Little Help needed standing up from a chair using your arms (e.g., wheelchair or bedside chair)?: A Little Help needed to walk in hospital room?: A Lot Help needed climbing 3-5 steps with a railing? : Total 6 Click Score: 13    End of Session Equipment Utilized During Treatment: Gait belt Activity Tolerance: Patient tolerated treatment well Patient left: in chair;with call bell/phone within reach;with chair alarm set;with nursing/sitter in room Nurse Communication: Mobility status PT Visit Diagnosis: Muscle weakness (generalized) (M62.81);Difficulty in walking, not elsewhere classified (R26.2);Unsteadiness on feet (R26.81)  Time: 2426-8341 PT Time Calculation (min) (ACUTE ONLY): 39 min   Charges:   PT Evaluation $PT Eval Low Complexity: 1 Low PT Treatments $Therapeutic Activity: 23-37 mins        Lavone Nian, PT, DPT 11/24/20, 11:15 AM   Waunita Schooner 11/24/2020, 11:09 AM

## 2020-11-25 LAB — GLUCOSE, CAPILLARY
Glucose-Capillary: 255 mg/dL — ABNORMAL HIGH (ref 70–99)
Glucose-Capillary: 177 mg/dL — ABNORMAL HIGH (ref 70–99)
Glucose-Capillary: 201 mg/dL — ABNORMAL HIGH (ref 70–99)
Glucose-Capillary: 143 mg/dL — ABNORMAL HIGH (ref 70–99)

## 2020-11-25 LAB — URINE CULTURE: Culture: >=100000 — AB

## 2020-11-25 MED ORDER — LIDOCAINE 5 % EX PTCH
1.0000 | MEDICATED_PATCH | CUTANEOUS | Status: DC
  Administered 2020-11-25 – 2020-11-29 (×5): 1 via TRANSDERMAL
  Filled 2020-11-25 (×6): qty 1

## 2020-11-25 MED ORDER — CEPHALEXIN 500 MG PO CAPS
500.0000 mg | ORAL_CAPSULE | Freq: Three times a day (TID) | ORAL | Status: AC
  Administered 2020-11-25 – 2020-11-28 (×10): 500 mg via ORAL
  Filled 2020-11-25 (×10): qty 1

## 2020-11-25 MED ORDER — INSULIN ASPART 100 UNIT/ML ~~LOC~~ SOLN
0.0000 [IU] | Freq: Three times a day (TID) | SUBCUTANEOUS | Status: DC
  Administered 2020-11-25: 5 [IU] via SUBCUTANEOUS
  Administered 2020-11-25: 3 [IU] via SUBCUTANEOUS
  Administered 2020-11-26: 5 [IU] via SUBCUTANEOUS
  Administered 2020-11-26: 17:00:00 3 [IU] via SUBCUTANEOUS
  Administered 2020-11-27 – 2020-11-28 (×4): 2 [IU] via SUBCUTANEOUS
  Administered 2020-11-28: 7 [IU] via SUBCUTANEOUS
  Administered 2020-11-28: 18:00:00 2 [IU] via SUBCUTANEOUS
  Administered 2020-11-29: 5 [IU] via SUBCUTANEOUS
  Administered 2020-11-29: 2 [IU] via SUBCUTANEOUS
  Administered 2020-11-29: 13:00:00 3 [IU] via SUBCUTANEOUS
  Administered 2020-11-30: 09:00:00 2 [IU] via SUBCUTANEOUS
  Administered 2020-11-30: 3 [IU] via SUBCUTANEOUS
  Filled 2020-11-25 (×15): qty 1

## 2020-11-25 MED ORDER — LIDOCAINE 5 % EX PTCH
1.0000 | MEDICATED_PATCH | CUTANEOUS | Status: DC
  Administered 2020-11-25 – 2020-11-29 (×5): 1 via TRANSDERMAL
  Filled 2020-11-25 (×7): qty 1

## 2020-11-25 MED ORDER — INSULIN ASPART 100 UNIT/ML ~~LOC~~ SOLN
0.0000 [IU] | Freq: Every day | SUBCUTANEOUS | Status: DC
  Administered 2020-11-26 – 2020-11-27 (×2): 2 [IU] via SUBCUTANEOUS
  Administered 2020-11-28: 3 [IU] via SUBCUTANEOUS
  Administered 2020-11-29: 23:00:00 2 [IU] via SUBCUTANEOUS
  Filled 2020-11-25 (×4): qty 1

## 2020-11-25 MED ORDER — FUROSEMIDE 10 MG/ML IJ SOLN
40.0000 mg | Freq: Once | INTRAMUSCULAR | Status: AC
  Administered 2020-11-25: 40 mg via INTRAVENOUS
  Filled 2020-11-25: qty 4

## 2020-11-25 NOTE — Progress Notes (Addendum)
Progress Note    Regina [name]  GBT:[id number] DOB: 22-Jun-1934  DOA: 11/22/2020 PCP: Donnie Coffin, MD      Brief Narrative:    Medical records reviewed and are as summarized below:  Regina [name] is a 87 y.o. female with medical history significant for CAD s/p PCI in 2011, paroxysmal atrial fibrillation not on anticoagulation because of history of GI bleed, sick sinus syndrome s/p permanent pacemaker, CKD stage IIIb, insulin-dependent type 2 diabetes mellitus, hypothyroidism, hypertension, hyperlipidemia, morbid obesity, chronic diarrhea, multijoint arthritis involving the knees, lower back and shoulders.  She was recently hospitalized from 10/17/2020 to 10/29/2018 for acute blood loss anemia secondary to upper GI bleed requiring blood transfusion.  EGD on 10/21/2018 showed esophageal ulcer.  She was previously on Plavix but this was discontinued.  She was discharged to the nursing home on that visit but she said she had since been discharged to home from the nursing home where she lives alone.  She presented to the hospital because of generalized weakness and loose stools.  She also complained of recent urinary incontinence and dysuria.  She has chronic low back pain chronic pain in the left arm/shoulder.  She complained of swelling in the lower extremities.  She was found to have hypoglycemia, hypokalemia and acute UTI.  She was treated with IV fluids and antibiotics.  Hypoglycemia and hypokalemia were treated.  Stool for C. difficile toxin was negative.  Venous duplex of the lower extremities was negative for DVT patient was found to have left Baker's cyst.  She was evaluated by PT and OT recommended further rehabilitation_lateral facility.      Assessment/Plan:   Principal Problem:   Generalized weakness Active Problems:   Acute lower UTI   Hypoglycemia   Insulin dependent type 2 diabetes mellitus (HCC)   Hypertension associated with diabetes (HCC)   Paroxysmal atrial  fibrillation (HCC)   CKD (chronic kidney disease), stage III (HCC)   Hyperlipidemia associated with type 2 diabetes mellitus (HCC)   CAD (coronary artery disease)   Hypothyroidism   Hypotension   Body mass index is 45.28 kg/m.    Probable acute UTI/bacteriuria: Urine culture showed Klebsiella pneumoniae.  Change IV ceftriaxone to oral Keflex.  She said she just got treated for UTI.  Chronic diarrhea: Unknown if patient has IBS.  Imodium as needed for diarrhea.  Hypotension: BP stable but hold antihypertensives.  Hypokalemia: Improved.  IDDM with hyperglycemia and recent hypoglycemia: Hemoglobin A1c was 5.6 about a month ago.  Use NovoLog sliding scale as needed for hyperglycemia.  Paroxysmal atrial fibrillation, sick sinus syndrome s/p permanent pacemaker: Continue aspirin. CHA2DS2-VASC score is 6 but she is not on anticoagulation because of history of GI bleeding.  Hyperlipidemia, CAD s/p PCI 2009: Continue aspirin and rosuvastatin  Generalized weakness: SNF recommended at discharge  CKD stage IIIb: Creatinine is trending up.  Continue to monitor.  Upper and lower extremity edema: This is probably from CKD.  Treat with IV Lasix.  Obtain 2D echo for further evaluation.  Recent admission for acute blood loss anemia due to upper GI bleed from esophageal ulcer..  Continue Protonix   Diet Order            DIET SOFT Room service appropriate? Yes; Fluid consistency: Thin  Diet effective now                    Consultants:  None  Procedures:  None    Medications:   .  aspirin EC  81 mg Oral Daily  . insulin aspart  0-5 Units Subcutaneous QHS  . insulin aspart  0-9 Units Subcutaneous TID WC  . levothyroxine  75 mcg Oral Daily  . lidocaine  1 patch Transdermal Daily  . pantoprazole  40 mg Oral BID AC  . potassium chloride  40 mEq Oral Once  . ranolazine  500 mg Oral BID  . rosuvastatin  10 mg Oral Daily  . sodium chloride flush  3 mL Intravenous Q12H  .  cyanocobalamin  1,000 mcg Oral Daily   Continuous Infusions: . cefTRIAXone (ROCEPHIN)  IV 1 g (11/24/20 2113)     Anti-infectives (From admission, onward)   Start     Dose/Rate Route Frequency Ordered Stop   11/23/20 2000  cefTRIAXone (ROCEPHIN) 1 g in sodium chloride 0.9 % 100 mL IVPB        1 g 200 mL/hr over 30 Minutes Intravenous Every 24 hours 11/22/20 2055     11/22/20 2000  cefTRIAXone (ROCEPHIN) 1 g in sodium chloride 0.9 % 100 mL IVPB        1 g 200 mL/hr over 30 Minutes Intravenous  Once 11/22/20 1952 11/23/20 0322             Family Communication/Anticipated D/C date and plan/Code Status   DVT prophylaxis: SCDs Start: 11/22/20 2051     Code Status: DNR  Family Communication: None Disposition Plan:    Status is: Inpatient  Remains inpatient appropriate because:Inpatient level of care appropriate due to severity of illness   Dispo: The patient is from: Home              Anticipated d/c is to: Home              Patient currently is not medically stable to d/c.   Difficult to place patient No                Subjective:   C/o low back pain and left arm/shoulder pain.   Objective:    Vitals:   11/25/20 0500 11/25/20 0501 11/25/20 0753 11/25/20 1140  BP:  (!) 100/38 (!) 126/51 105/65  Pulse:  75 80 89  Resp:  18 16 18   Temp:  97.8 F (36.6 C) 97.8 F (36.6 C) 97.9 F (36.6 C)  TempSrc:  Oral    SpO2:  97% 99% 98%  Weight: 101.7 kg      No data found.   Intake/Output Summary (Last 24 hours) at 11/25/2020 1517 Last data filed at 11/25/2020 1014 Gross per 24 hour  Intake 60 ml  Output 275 ml  Net -215 ml   Filed Weights   11/24/20 0500 11/25/20 0500  Weight: 103.1 kg 101.7 kg    Exam:  GEN: NAD SKIN: Multiple bruises on bilateral upper extremities EYES: EOMI ENT: MMM CV: RRR PULM: CTA B ABD: soft, obese, NT, +BS CNS: AAO x 3, non focal EXT: Edema of bilateral legs.  No erythema or tenderness.            Data Reviewed:   I have personally reviewed following labs and imaging studies:  Labs: Labs show the following:   Basic Metabolic Panel: Recent Labs  Lab 11/22/20 1738 11/23/20 0821 11/23/20 0842 11/23/20 1441 11/24/20 0545  NA 139  --  137  --  137  K 2.9*  --  3.0* 4.6 3.9  CL 102  --  101  --  105  CO2 26  --  25  --  23  GLUCOSE 53*  --  81  --  150*  BUN 14  --  12  --  11  CREATININE 1.10*  --  1.14*  --  1.20*  CALCIUM 8.7*  --  8.0*  --  7.8*  MG 1.6*  --  2.1  --  1.8  PHOS  --  3.4  --   --   --    GFR Estimated Creatinine Clearance: 36 mL/min (A) (by C-G formula based on SCr of 1.2 mg/dL (H)). Liver Function Tests: Recent Labs  Lab 11/22/20 1738  AST 32  ALT 17  ALKPHOS 80  BILITOT 0.5  PROT 6.4*  ALBUMIN 3.2*   Recent Labs  Lab 11/22/20 1738  LIPASE 21   No results for input(s): AMMONIA in the last 168 hours. Coagulation profile Recent Labs  Lab 11/22/20 1738  INR 1.2    CBC: Recent Labs  Lab 11/22/20 1738 11/23/20 0842 11/24/20 0545  WBC 7.8 9.3 6.2  NEUTROABS 5.8  --  3.8  HGB 11.5* 11.1* 9.3*  HCT 35.0* 34.7* 28.9*  MCV 90.4 93.5 90.6  PLT 272 236 211   Cardiac Enzymes: No results for input(s): CKTOTAL, CKMB, CKMBINDEX, TROPONINI in the last 168 hours. BNP (last 3 results) No results for input(s): PROBNP in the last 8760 hours. CBG: Recent Labs  Lab 11/24/20 0016 11/24/20 1206 11/24/20 2120 11/25/20 1144 11/25/20 1413  GLUCAP 175* 192* 340* 255* 177*   D-Dimer: No results for input(s): DDIMER in the last 72 hours. Hgb A1c: No results for input(s): HGBA1C in the last 72 hours. Lipid Profile: No results for input(s): CHOL, HDL, LDLCALC, TRIG, CHOLHDL, LDLDIRECT in the last 72 hours. Thyroid function studies: Recent Labs    11/22/20 1738  TSH 2.618   Anemia work up: No results for input(s): VITAMINB12, FOLATE, FERRITIN, TIBC, IRON, RETICCTPCT in the last 72 hours. Sepsis Labs: Recent Labs  Lab  11/22/20 1736 11/22/20 1738 11/23/20 0842 11/24/20 0545  WBC  --  7.8 9.3 6.2  LATICACIDVEN 2.4*  --  2.4*  --     Microbiology Recent Results (from the past 240 hour(s))  Blood culture (routine single)     Status: None (Preliminary result)   Collection Time: 11/22/20  5:36 PM   Specimen: BLOOD  Result Value Ref Range Status   Specimen Description BLOOD BLOOD RIGHT FOREARM  Final   Special Requests   Final    BOTTLES DRAWN AEROBIC AND ANAEROBIC Blood Culture adequate volume   Culture   Final    NO GROWTH 3 DAYS Performed at [facility], [address], [place], [place] [address]    Report Status PENDING  Incomplete  Urine culture     Status: Abnormal   Collection Time: 11/22/20  6:40 PM   Specimen: In/Out Cath Urine  Result Value Ref Range Status   Specimen Description   Final    IN/OUT CATH URINE Performed at [facility], [address], [place], [place] [address]    Special Requests   Final    NONE Performed at [facility], [address] [place], [place] [address]    Culture >=100,000 COLONIES/mL KLEBSIELLA PNEUMONIAE (A)  Final   Report Status 11/25/2020 FINAL  Final   Organism ID, Bacteria KLEBSIELLA PNEUMONIAE (A)  Final      Susceptibility   Klebsiella pneumoniae - MIC*    AMPICILLIN RESISTANT Resistant     CEFAZOLIN <=4 SENSITIVE Sensitive     CEFEPIME <=  0.12 SENSITIVE Sensitive     CEFTRIAXONE <=0.25 SENSITIVE Sensitive     CIPROFLOXACIN <=0.25 SENSITIVE Sensitive     GENTAMICIN <=1 SENSITIVE Sensitive     IMIPENEM <=0.25 SENSITIVE Sensitive     NITROFURANTOIN <=16 SENSITIVE Sensitive     TRIMETH/SULFA <=20 SENSITIVE Sensitive     AMPICILLIN/SULBACTAM <=2 SENSITIVE Sensitive     PIP/TAZO <=4 SENSITIVE Sensitive     * >=100,000 COLONIES/mL KLEBSIELLA PNEUMONIAE  C Difficile Quick Screen w PCR reflex     Status: None   Collection Time: 11/22/20  6:42 PM   Specimen: Urine, Clean Catch; Stool  Result Value Ref Range Status    C Diff antigen NEGATIVE NEGATIVE Final   C Diff toxin NEGATIVE NEGATIVE Final   C Diff interpretation No C. difficile detected.  Final    Comment: Performed at [facility], [address] [place], [place] [address]  Gastrointestinal Panel by PCR , Stool     Status: None   Collection Time: 11/22/20  6:43 PM   Specimen: Urine, Clean Catch; Stool  Result Value Ref Range Status   Campylobacter species NOT DETECTED NOT DETECTED Final   Plesimonas shigelloides NOT DETECTED NOT DETECTED Final   Salmonella species NOT DETECTED NOT DETECTED Final   Yersinia enterocolitica NOT DETECTED NOT DETECTED Final   Vibrio species NOT DETECTED NOT DETECTED Final   Vibrio cholerae NOT DETECTED NOT DETECTED Final   Enteroaggregative E coli (EAEC) NOT DETECTED NOT DETECTED Final   Enteropathogenic E coli (EPEC) NOT DETECTED NOT DETECTED Final   Enterotoxigenic E coli (ETEC) NOT DETECTED NOT DETECTED Final   Shiga like toxin producing E coli (STEC) NOT DETECTED NOT DETECTED Final   Shigella/Enteroinvasive E coli (EIEC) NOT DETECTED NOT DETECTED Final   Cryptosporidium NOT DETECTED NOT DETECTED Final   Cyclospora cayetanensis NOT DETECTED NOT DETECTED Final   Entamoeba histolytica NOT DETECTED NOT DETECTED Final   Giardia lamblia NOT DETECTED NOT DETECTED Final   Adenovirus F40/41 NOT DETECTED NOT DETECTED Final   Astrovirus NOT DETECTED NOT DETECTED Final   Norovirus GI/GII NOT DETECTED NOT DETECTED Final   Rotavirus A NOT DETECTED NOT DETECTED Final   Sapovirus (I, II, IV, and V) NOT DETECTED NOT DETECTED Final    Comment: Performed at [facility], [address] [place], [place] [address]  Resp Panel by RT-PCR (Flu A&B, Covid) Nasopharyngeal Swab     Status: None   Collection Time: 11/22/20  9:45 PM   Specimen: Nasopharyngeal Swab; Nasopharyngeal(NP) swabs in vial transport medium  Result Value Ref Range Status   SARS Coronavirus 2 by RT PCR NEGATIVE NEGATIVE Final    Comment:  (NOTE) SARS-CoV-2 target nucleic acids are NOT DETECTED.  The SARS-CoV-2 RNA is generally detectable in upper respiratory specimens during the acute phase of infection. The lowest concentration of SARS-CoV-2 viral copies this assay can detect is 138 copies/mL. A negative result does not preclude SARS-Cov-2 infection and should not be used as the sole basis for treatment or other patient management decisions. A negative result may occur with  improper specimen collection/handling, submission of specimen other than nasopharyngeal swab, presence of viral mutation(s) within the areas targeted by this assay, and inadequate number of viral copies(<138 copies/mL). A negative result must be combined with clinical observations, patient history, and epidemiological information. The expected result is Negative.  Fact Sheet for Patients:  EntrepreneurPulse.com.au  Fact Sheet for Healthcare Providers:  IncredibleEmployment.be  This test is no t yet approved or cleared by  the [employer] and  has been authorized for detection and/or diagnosis of SARS-CoV-2 by FDA under an Emergency Use Authorization (EUA). This EUA will remain  in effect (meaning this test can be used) for the duration of the COVID-19 declaration under Section 564(b)(1) of the Act, 21 U.S.C.section 360bbb-3(b)(1), unless the authorization is terminated  or revoked sooner.       Influenza A by PCR NEGATIVE NEGATIVE Final   Influenza B by PCR NEGATIVE NEGATIVE Final    Comment: (NOTE) The Xpert Xpress SARS-CoV-2/FLU/RSV plus assay is intended as an aid in the diagnosis of influenza from Nasopharyngeal swab specimens and should not be used as a sole basis for treatment. Nasal washings and aspirates are unacceptable for Xpert Xpress SARS-CoV-2/FLU/RSV testing.  Fact Sheet for Patients: EntrepreneurPulse.com.au  Fact Sheet for Healthcare  Providers: IncredibleEmployment.be  This test is not yet approved or cleared by the [place] FDA and has been authorized for detection and/or diagnosis of SARS-CoV-2 by FDA under an Emergency Use Authorization (EUA). This EUA will remain in effect (meaning this test can be used) for the duration of the COVID-19 declaration under Section 564(b)(1) of the Act, 21 U.S.C. section 360bbb-3(b)(1), unless the authorization is terminated or revoked.  Performed at [facility], [address] [place], [place] [address]     Procedures and diagnostic studies:  No results found.             LOS: 2 days   Cyril Railey  [facility]   Pager on [email or web address]. If 7PM-7AM, please contact night-coverage at [email or web address]     11/25/2020, 3:17 PM

## 2020-11-26 DIAGNOSIS — I480 Paroxysmal atrial fibrillation: Secondary | ICD-10-CM

## 2020-11-26 LAB — CBC WITH DIFFERENTIAL/PLATELET
Abs Immature Granulocytes: 0.03 10*3/uL (ref 0.00–0.07)
Basophils Absolute: 0 10*3/uL (ref 0.0–0.1)
Basophils Relative: 1 %
Eosinophils Absolute: 0.2 10*3/uL (ref 0.0–0.5)
Eosinophils Relative: 3 %
HCT: 29.5 % — ABNORMAL LOW (ref 36.0–46.0)
Hemoglobin: 9.8 g/dL — ABNORMAL LOW (ref 12.0–15.0)
Immature Granulocytes: 1 %
Lymphocytes Relative: 25 %
Lymphs Abs: 1.6 10*3/uL (ref 0.7–4.0)
MCH: 29.9 pg (ref 26.0–34.0)
MCHC: 33.2 g/dL (ref 30.0–36.0)
MCV: 89.9 fL (ref 80.0–100.0)
Monocytes Absolute: 0.6 10*3/uL (ref 0.1–1.0)
Monocytes Relative: 9 %
Neutro Abs: 4 10*3/uL (ref 1.7–7.7)
Neutrophils Relative %: 61 %
Platelets: 229 10*3/uL (ref 150–400)
RBC: 3.28 MIL/uL — ABNORMAL LOW (ref 3.87–5.11)
RDW: 14.3 % (ref 11.5–15.5)
WBC: 6.4 10*3/uL (ref 4.0–10.5)
nRBC: 0 % (ref 0.0–0.2)

## 2020-11-26 LAB — GLUCOSE, CAPILLARY
Glucose-Capillary: 265 mg/dL — ABNORMAL HIGH (ref 70–99)
Glucose-Capillary: 222 mg/dL — ABNORMAL HIGH (ref 70–99)
Glucose-Capillary: 229 mg/dL — ABNORMAL HIGH (ref 70–99)

## 2020-11-26 LAB — BASIC METABOLIC PANEL
Anion gap: 9 (ref 5–15)
BUN: 14 mg/dL (ref 8–23)
CO2: 25 mmol/L (ref 22–32)
Calcium: 8.1 mg/dL — ABNORMAL LOW (ref 8.9–10.3)
Chloride: 102 mmol/L (ref 98–111)
Creatinine, Ser: 1.2 mg/dL — ABNORMAL HIGH (ref 0.44–1.00)
GFR, Estimated: 44 mL/min — ABNORMAL LOW (ref >60)
Glucose, Bld: 192 mg/dL — ABNORMAL HIGH (ref 70–99)
Potassium: 4.3 mmol/L (ref 3.5–5.1)
Sodium: 136 mmol/L (ref 135–145)

## 2020-11-26 LAB — MAGNESIUM: Magnesium: 2 mg/dL (ref 1.7–2.4)

## 2020-11-26 MED ORDER — FUROSEMIDE 10 MG/ML IJ SOLN
40.0000 mg | Freq: Once | INTRAMUSCULAR | Status: AC
  Administered 2020-11-26: 40 mg via INTRAVENOUS
  Filled 2020-11-26: qty 4

## 2020-11-26 NOTE — Progress Notes (Signed)
Progress Note    Regina [name]  TDV:[id number] DOB: Nov 25, 1933  DOA: 11/22/2020 PCP: Donnie Coffin, MD      Brief Narrative:    Medical records reviewed and are as summarized below:  Regina [name] is a 87 y.o. female with medical history significant for CAD s/p PCI in 2011, paroxysmal atrial fibrillation not on anticoagulation because of history of GI bleed, sick sinus syndrome s/p permanent pacemaker, CKD stage IIIb, insulin-dependent type 2 diabetes mellitus, hypothyroidism, hypertension, hyperlipidemia, morbid obesity, chronic diarrhea, multijoint arthritis involving the knees, lower back and shoulders.  She was recently hospitalized from 10/17/2020 to 10/29/2018 for acute blood loss anemia secondary to upper GI bleed requiring blood transfusion.  EGD on 10/21/2018 showed esophageal ulcer.  She was previously on Plavix but this was discontinued.  She was discharged to the nursing home on that visit but she said she had since been discharged to home from the nursing home where she lives alone.  She presented to the hospital because of generalized weakness and loose stools.  She also complained of recent urinary incontinence and dysuria.  She has chronic low back pain chronic pain in the left arm/shoulder.  She complained of swelling in the lower extremities.  She was found to have hypoglycemia, hypokalemia and acute UTI.  She was treated with IV fluids and antibiotics.  Hypoglycemia and hypokalemia were treated.  Stool for C. difficile toxin was negative.  Venous duplex of the lower extremities was negative for DVT patient was found to have left Baker's cyst.  She was evaluated by PT and OT recommended further rehabilitation_lateral facility.      Assessment/Plan:   Principal Problem:   Generalized weakness Active Problems:   Acute lower UTI   Hypoglycemia   Insulin dependent type 2 diabetes mellitus (HCC)   Hypertension associated with diabetes (HCC)   Paroxysmal atrial  fibrillation (HCC)   CKD (chronic kidney disease), stage III (HCC)   Hyperlipidemia associated with type 2 diabetes mellitus (HCC)   CAD (coronary artery disease)   Hypothyroidism   Hypotension   Body mass index is 44.13 kg/m.    Probable acute UTI/bacteriuria: Urine culture showed Klebsiella pneumoniae.  Continue Keflex she said she just got treated for UTI.  Chronic diarrhea: Unknown if patient has IBS.  Imodium as needed for diarrhea.  Hypotension: BP is better.  Hypokalemia: Improved.  IDDM with hyperglycemia and recent hypoglycemia: Hemoglobin A1c was 5.6 about a month ago.  Use NovoLog sliding scale as needed for hyperglycemia.  Paroxysmal atrial fibrillation, sick sinus syndrome s/p permanent pacemaker: Continue aspirin. CHA2DS2-VASC score is 6 but she is not on anticoagulation because of history of GI bleeding.  Hyperlipidemia, CAD s/p PCI 2009: Continue aspirin and rosuvastatin  Generalized weakness: SNF recommended at discharge  CKD stage IIIb: Creatinine is stable.  Monitor BMP.  Upper and lower extremity edema: Repeat IV Lasix 40 mg x 1 dose today.  2D echo is pending.  Recent admission for acute blood loss anemia due to upper GI bleed from esophageal ulcer..  Continue Protonix   Diet Order            DIET SOFT Room service appropriate? Yes; Fluid consistency: Thin  Diet effective now                    Consultants:  None  Procedures:  None    Medications:   . aspirin EC  81 mg Oral Daily  . cephALEXin  500 mg Oral Q8H  . furosemide  40 mg Intravenous Once  . insulin aspart  0-5 Units Subcutaneous QHS  . insulin aspart  0-9 Units Subcutaneous TID WC  . levothyroxine  75 mcg Oral Daily  . lidocaine  1 patch Transdermal Daily  . lidocaine  1 patch Transdermal Q24H  . lidocaine  1 patch Transdermal Q24H  . pantoprazole  40 mg Oral BID AC  . potassium chloride  40 mEq Oral Once  . ranolazine  500 mg Oral BID  . rosuvastatin  10 mg Oral  Daily  . sodium chloride flush  3 mL Intravenous Q12H  . cyanocobalamin  1,000 mcg Oral Daily   Continuous Infusions:    Anti-infectives (From admission, onward)   Start     Dose/Rate Route Frequency Ordered Stop   11/25/20 2000  cephALEXin (KEFLEX) capsule 500 mg        500 mg Oral Every 8 hours 11/25/20 1530     11/23/20 2000  cefTRIAXone (ROCEPHIN) 1 g in sodium chloride 0.9 % 100 mL IVPB  Status:  Discontinued        1 g 200 mL/hr over 30 Minutes Intravenous Every 24 hours 11/22/20 2055 11/25/20 1530   11/22/20 2000  cefTRIAXone (ROCEPHIN) 1 g in sodium chloride 0.9 % 100 mL IVPB        1 g 200 mL/hr over 30 Minutes Intravenous  Once 11/22/20 1952 11/23/20 0322             Family Communication/Anticipated D/C date and plan/Code Status   DVT prophylaxis: SCDs Start: 11/22/20 2051     Code Status: DNR  Family Communication: None Disposition Plan:    Status is: Inpatient  Remains inpatient appropriate because:Inpatient level of care appropriate due to severity of illness   Dispo: The patient is from: Home              Anticipated d/c is to: Home              Patient currently is not medically stable to d/c.   Difficult to place patient No                Subjective:   No new complaints.  She had requested 2 additional lidocaine patches for her back and left arm pain.  She said this is helping with her pain.  No diarrhea.   Objective:    Vitals:   11/26/20 0500 11/26/20 0614 11/26/20 0739 11/26/20 1150  BP:  (!) 110/45 (!) 123/51 134/65  Pulse:  89 85 88  Resp:  18 17 19   Temp:  98.4 F (36.9 C) 98 F (36.7 C) 98 F (36.7 C)  TempSrc:  Oral    SpO2:  98% 98% 97%  Weight: 99.1 kg      No data found.   Intake/Output Summary (Last 24 hours) at 11/26/2020 1158 Last data filed at 11/26/2020 0500 Gross per 24 hour  Intake 240 ml  Output 400 ml  Net -160 ml   Filed Weights   11/24/20 0500 11/25/20 0500 11/26/20 0500  Weight: 103.1  kg 101.7 kg 99.1 kg    Exam:  GEN: NAD SKIN: Multiple bruises on bilateral upper extremities EYES: No pallor or icterus ENT: MMM CV: RRR PULM: CTA B ABD: soft, obese, NT, +BS CNS: AAO x 3, non focal EXT: Edema of bilateral arms and bilateral legs      Data Reviewed:   I have personally reviewed following labs and imaging  studies:  Labs: Labs show the following:   Basic Metabolic Panel: Recent Labs  Lab 11/22/20 1738 11/23/20 0821 11/23/20 0842 11/23/20 1441 11/24/20 0545 11/26/20 0330  NA 139  --  137  --  137 136  K 2.9*  --  3.0*   < > 3.9 4.3  CL 102  --  101  --  105 102  CO2 26  --  25  --  23 25  GLUCOSE 53*  --  81  --  150* 192*  BUN 14  --  12  --  11 14  CREATININE 1.10*  --  1.14*  --  1.20* 1.20*  CALCIUM 8.7*  --  8.0*  --  7.8* 8.1*  MG 1.6*  --  2.1  --  1.8 2.0  PHOS  --  3.4  --   --   --   --    < > = values in this interval not displayed.   GFR Estimated Creatinine Clearance: 35.5 mL/min (A) (by C-G formula based on SCr of 1.2 mg/dL (H)). Liver Function Tests: Recent Labs  Lab 11/22/20 1738  AST 32  ALT 17  ALKPHOS 80  BILITOT 0.5  PROT 6.4*  ALBUMIN 3.2*   Recent Labs  Lab 11/22/20 1738  LIPASE 21   No results for input(s): AMMONIA in the last 168 hours. Coagulation profile Recent Labs  Lab 11/22/20 1738  INR 1.2    CBC: Recent Labs  Lab 11/22/20 1738 11/23/20 0842 11/24/20 0545 11/26/20 0330  WBC 7.8 9.3 6.2 6.4  NEUTROABS 5.8  --  3.8 4.0  HGB 11.5* 11.1* 9.3* 9.8*  HCT 35.0* 34.7* 28.9* 29.5*  MCV 90.4 93.5 90.6 89.9  PLT 272 236 211 229   Cardiac Enzymes: No results for input(s): CKTOTAL, CKMB, CKMBINDEX, TROPONINI in the last 168 hours. BNP (last 3 results) No results for input(s): PROBNP in the last 8760 hours. CBG: Recent Labs  Lab 11/24/20 2120 11/25/20 1144 11/25/20 1413 11/25/20 1634 11/25/20 2015  GLUCAP 340* 255* 177* 201* 143*   D-Dimer: No results for input(s): DDIMER in the last 72  hours. Hgb A1c: No results for input(s): HGBA1C in the last 72 hours. Lipid Profile: No results for input(s): CHOL, HDL, LDLCALC, TRIG, CHOLHDL, LDLDIRECT in the last 72 hours. Thyroid function studies: No results for input(s): TSH, T4TOTAL, T3FREE, THYROIDAB in the last 72 hours.  Invalid input(s): FREET3 Anemia work up: No results for input(s): VITAMINB12, FOLATE, FERRITIN, TIBC, IRON, RETICCTPCT in the last 72 hours. Sepsis Labs: Recent Labs  Lab 11/22/20 1736 11/22/20 1738 11/23/20 0842 11/24/20 0545 11/26/20 0330  WBC  --  7.8 9.3 6.2 6.4  LATICACIDVEN 2.4*  --  2.4*  --   --     Microbiology Recent Results (from the past 240 hour(s))  Blood culture (routine single)     Status: None (Preliminary result)   Collection Time: 11/22/20  5:36 PM   Specimen: BLOOD  Result Value Ref Range Status   Specimen Description BLOOD BLOOD RIGHT FOREARM  Final   Special Requests   Final    BOTTLES DRAWN AEROBIC AND ANAEROBIC Blood Culture adequate volume   Culture   Final    NO GROWTH 4 DAYS Performed at [facility], [address] [place], [place] [address]    Report Status PENDING  Incomplete  Urine culture     Status: Abnormal   Collection Time: 11/22/20  6:40 PM   Specimen: In/Out Cath Urine  Result Value Ref Range Status   Specimen Description   Final    IN/OUT CATH URINE Performed at [facility], [address] [place], [place] [address]    Special Requests   Final    NONE Performed at [facility], [address] [place], [place] [address]    Culture >=100,000 COLONIES/mL KLEBSIELLA PNEUMONIAE (A)  Final   Report Status 11/25/2020 FINAL  Final   Organism ID, Bacteria KLEBSIELLA PNEUMONIAE (A)  Final      Susceptibility   Klebsiella pneumoniae - MIC*    AMPICILLIN RESISTANT Resistant     CEFAZOLIN <=4 SENSITIVE Sensitive     CEFEPIME <=0.12 SENSITIVE Sensitive     CEFTRIAXONE <=0.25 SENSITIVE Sensitive     CIPROFLOXACIN <=0.25  SENSITIVE Sensitive     GENTAMICIN <=1 SENSITIVE Sensitive     IMIPENEM <=0.25 SENSITIVE Sensitive     NITROFURANTOIN <=16 SENSITIVE Sensitive     TRIMETH/SULFA <=20 SENSITIVE Sensitive     AMPICILLIN/SULBACTAM <=2 SENSITIVE Sensitive     PIP/TAZO <=4 SENSITIVE Sensitive     * >=100,000 COLONIES/mL KLEBSIELLA PNEUMONIAE  C Difficile Quick Screen w PCR reflex     Status: None   Collection Time: 11/22/20  6:42 PM   Specimen: Urine, Clean Catch; Stool  Result Value Ref Range Status   C Diff antigen NEGATIVE NEGATIVE Final   C Diff toxin NEGATIVE NEGATIVE Final   C Diff interpretation No C. difficile detected.  Final    Comment: Performed at [facility], [address] [place], [place] [address]  Gastrointestinal Panel by PCR , Stool     Status: None   Collection Time: 11/22/20  6:43 PM   Specimen: Urine, Clean Catch; Stool  Result Value Ref Range Status   Campylobacter species NOT DETECTED NOT DETECTED Final   Plesimonas shigelloides NOT DETECTED NOT DETECTED Final   Salmonella species NOT DETECTED NOT DETECTED Final   Yersinia enterocolitica NOT DETECTED NOT DETECTED Final   Vibrio species NOT DETECTED NOT DETECTED Final   Vibrio cholerae NOT DETECTED NOT DETECTED Final   Enteroaggregative E coli (EAEC) NOT DETECTED NOT DETECTED Final   Enteropathogenic E coli (EPEC) NOT DETECTED NOT DETECTED Final   Enterotoxigenic E coli (ETEC) NOT DETECTED NOT DETECTED Final   Shiga like toxin producing E coli (STEC) NOT DETECTED NOT DETECTED Final   Shigella/Enteroinvasive E coli (EIEC) NOT DETECTED NOT DETECTED Final   Cryptosporidium NOT DETECTED NOT DETECTED Final   Cyclospora cayetanensis NOT DETECTED NOT DETECTED Final   Entamoeba histolytica NOT DETECTED NOT DETECTED Final   Giardia lamblia NOT DETECTED NOT DETECTED Final   Adenovirus F40/41 NOT DETECTED NOT DETECTED Final   Astrovirus NOT DETECTED NOT DETECTED Final   Norovirus GI/GII NOT DETECTED NOT DETECTED Final    Rotavirus A NOT DETECTED NOT DETECTED Final   Sapovirus (I, II, IV, and V) NOT DETECTED NOT DETECTED Final    Comment: Performed at [facility], [address] [place], [place] [address]  Resp Panel by RT-PCR (Flu A&B, Covid) Nasopharyngeal Swab     Status: None   Collection Time: 11/22/20  9:45 PM   Specimen: Nasopharyngeal Swab; Nasopharyngeal(NP) swabs in vial transport medium  Result Value Ref Range Status   SARS Coronavirus 2 by RT PCR NEGATIVE NEGATIVE Final    Comment: (NOTE) SARS-CoV-2 target nucleic acids are NOT DETECTED.  The SARS-CoV-2 RNA is generally detectable in upper respiratory specimens during the acute phase of infection. The lowest concentration of  SARS-CoV-2 viral copies this assay can detect is 138 copies/mL. A negative result does not preclude SARS-Cov-2 infection and should not be used as the sole basis for treatment or other patient management decisions. A negative result may occur with  improper specimen collection/handling, submission of specimen other than nasopharyngeal swab, presence of viral mutation(s) within the areas targeted by this assay, and inadequate number of viral copies(<138 copies/mL). A negative result must be combined with clinical observations, patient history, and epidemiological information. The expected result is Negative.  Fact Sheet for Patients:  EntrepreneurPulse.com.au  Fact Sheet for Healthcare Providers:  IncredibleEmployment.be  This test is no t yet approved or cleared by the [place] FDA and  has been authorized for detection and/or diagnosis of SARS-CoV-2 by FDA under an Emergency Use Authorization (EUA). This EUA will remain  in effect (meaning this test can be used) for the duration of the COVID-19 declaration under Section 564(b)(1) of the Act, 21 U.S.C.section 360bbb-3(b)(1), unless the authorization is terminated  or revoked sooner.       Influenza A by PCR  NEGATIVE NEGATIVE Final   Influenza B by PCR NEGATIVE NEGATIVE Final    Comment: (NOTE) The Xpert Xpress SARS-CoV-2/FLU/RSV plus assay is intended as an aid in the diagnosis of influenza from Nasopharyngeal swab specimens and should not be used as a sole basis for treatment. Nasal washings and aspirates are unacceptable for Xpert Xpress SARS-CoV-2/FLU/RSV testing.  Fact Sheet for Patients: EntrepreneurPulse.com.au  Fact Sheet for Healthcare Providers: IncredibleEmployment.be  This test is not yet approved or cleared by the [place] FDA and has been authorized for detection and/or diagnosis of SARS-CoV-2 by FDA under an Emergency Use Authorization (EUA). This EUA will remain in effect (meaning this test can be used) for the duration of the COVID-19 declaration under Section 564(b)(1) of the Act, 21 U.S.C. section 360bbb-3(b)(1), unless the authorization is terminated or revoked.  Performed at [facility], [address] [place], [place] [address]     Procedures and diagnostic studies:  No results found.             LOS: 3 days   Myra Weng  [facility]   Pager on [email or web address]. If 7PM-7AM, please contact night-coverage at [email or web address]     11/26/2020, 11:58 AM

## 2020-11-26 NOTE — Progress Notes (Signed)
NT reported that patient refused blood sugar check this morning.  MD notified.  Will continue to monitor.

## 2020-11-27 ENCOUNTER — Inpatient Hospital Stay (HOSPITAL_COMMUNITY)
Admit: 2020-11-27 | Discharge: 2020-11-27 | Disposition: A | Discharge status: Home / Self Care | Payer: Medicare Other | Attending: Internal Medicine | Admitting: Internal Medicine

## 2020-11-27 DIAGNOSIS — R531 Weakness: Secondary | ICD-10-CM

## 2020-11-27 DIAGNOSIS — I351 Nonrheumatic aortic (valve) insufficiency: Secondary | ICD-10-CM

## 2020-11-27 LAB — BASIC METABOLIC PANEL
Anion gap: 9 (ref 5–15)
BUN: 13 mg/dL (ref 8–23)
CO2: 25 mmol/L (ref 22–32)
Calcium: 8.4 mg/dL — ABNORMAL LOW (ref 8.9–10.3)
Chloride: 99 mmol/L (ref 98–111)
Creatinine, Ser: 1.16 mg/dL — ABNORMAL HIGH (ref 0.44–1.00)
GFR, Estimated: 46 mL/min — ABNORMAL LOW (ref >60)
Glucose, Bld: 207 mg/dL — ABNORMAL HIGH (ref 70–99)
Potassium: 4.3 mmol/L (ref 3.5–5.1)
Sodium: 133 mmol/L — ABNORMAL LOW (ref 135–145)

## 2020-11-27 LAB — CULTURE, BLOOD (SINGLE)
Culture: NO GROWTH
Special Requests: ADEQUATE

## 2020-11-27 LAB — ECHOCARDIOGRAM COMPLETE
AR max vel: 1.88 cm2
AV Area VTI: 1.44 cm2
AV Area mean vel: 1.66 cm2
AV Mean grad: 3 mmHg
AV Peak grad: 5.7 mmHg
Ao pk vel: 1.19 m/s
Area-P 1/2: 4.79 cm2
MV VTI: 1.86 cm2
S' Lateral: 3.3 cm
Weight: 3467.39 oz

## 2020-11-27 LAB — GLUCOSE, CAPILLARY
Glucose-Capillary: 185 mg/dL — ABNORMAL HIGH (ref 70–99)
Glucose-Capillary: 185 mg/dL — ABNORMAL HIGH (ref 70–99)
Glucose-Capillary: 193 mg/dL — ABNORMAL HIGH (ref 70–99)
Glucose-Capillary: 238 mg/dL — ABNORMAL HIGH (ref 70–99)

## 2020-11-27 LAB — MAGNESIUM: Magnesium: 1.8 mg/dL (ref 1.7–2.4)

## 2020-11-27 MED ORDER — FUROSEMIDE 10 MG/ML IJ SOLN
4.0000 mg/h | INTRAVENOUS | Status: DC
  Administered 2020-11-27 – 2020-11-29 (×2): 4 mg/h via INTRAVENOUS
  Filled 2020-11-27 (×2): qty 20

## 2020-11-27 NOTE — Progress Notes (Signed)
*  PRELIMINARY RESULTS* Echocardiogram 2D Echocardiogram has been performed.  Regina [name] Regina [name] 11/27/2020, 11:35 AM

## 2020-11-27 NOTE — TOC Progression Note (Addendum)
Transition of Care [facility]) - Progression Note    Patient Details  Name: Regina [name] MRN: [id number] Date of Birth: 1934-05-04  Transition of Care [facility] CM/SW [place], RN Phone Number: 11/27/2020, 5:00 PM  Clinical Narrative:    LVMM for Ricky @ [facility] requesting availability for bed placement.   Extended bed search.   Expected Discharge Plan: [place] Barriers to Discharge: Continued Medical Work up  Expected Discharge Plan and Services Expected Discharge Plan: [place]   Discharge Planning Services: CM Consult Post Acute Care Choice: [place] Living arrangements for the past 2 months: Single Family Home                 DME Arranged: N/A DME Agency: NA       HH Arranged: NA           Social Determinants of Health (SDOH) Interventions    Readmission Risk Interventions No flowsheet data found.

## 2020-11-27 NOTE — Progress Notes (Addendum)
Progress Note    Regina [name]  GEX:[id number] DOB: 03-06-1934  DOA: 11/22/2020 PCP: Donnie Coffin, MD      Brief Narrative:    Medical records reviewed and are as summarized below:  Regina [name] is a 87 y.o. female with medical history significant for CAD s/p PCI in 2011, paroxysmal atrial fibrillation not on anticoagulation because of history of GI bleed, sick sinus syndrome s/p permanent pacemaker, CKD stage IIIb, insulin-dependent type 2 diabetes mellitus, hypothyroidism, hypertension, hyperlipidemia, morbid obesity, chronic diarrhea, multijoint arthritis involving the knees, lower back and shoulders.  She was recently hospitalized from 10/17/2020 to 10/29/2018 for acute blood loss anemia secondary to upper GI bleed requiring blood transfusion.  EGD on 10/21/2018 showed esophageal ulcer.  She was previously on Plavix but this was discontinued.  She was discharged to the nursing home on that visit but she said she had since been discharged to home from the nursing home where she lives alone.  She presented to the hospital because of generalized weakness and loose stools.  She also complained of recent urinary incontinence and dysuria.  She has chronic low back pain chronic pain in the left arm/shoulder.  She complained of swelling in the lower extremities.  She was found to have hypoglycemia, hypokalemia and acute UTI.  She was treated with IV fluids and antibiotics.  Hypoglycemia and hypokalemia were treated.  Stool for C. difficile toxin was negative.  Venous duplex of the lower extremities was negative for DVT patient was found to have left Baker's cyst.  She was evaluated by PT and OT recommended further rehabilitation_lateral facility.      Assessment/Plan:   Principal Problem:   Generalized weakness Active Problems:   Acute lower UTI   Hypoglycemia   Insulin dependent type 2 diabetes mellitus (HCC)   Hypertension associated with diabetes (HCC)   Paroxysmal atrial  fibrillation (HCC)   CKD (chronic kidney disease), stage III (HCC)   Hyperlipidemia associated with type 2 diabetes mellitus (HCC)   CAD (coronary artery disease)   Hypothyroidism   Hypotension   Body mass index is 43.77 kg/m.    Probable acute UTI/bacteriuria: Urine culture showed Klebsiella pneumoniae.  Discontinue Keflex after tomorrow.    Chronic diarrhea: Unknown if patient has IBS.  Imodium as needed for diarrhea.  Hypotension: BP is better.  Hypokalemia: Improved.  IDDM with hyperglycemia and recent hypoglycemia: Hemoglobin A1c was 5.6 about a month ago.  Use NovoLog sliding scale as needed for hyperglycemia.  Paroxysmal atrial fibrillation, sick sinus syndrome s/p permanent pacemaker: Continue aspirin. CHA2DS2-VASC score is 6 but she is not on anticoagulation because of history of GI bleeding.  Hyperlipidemia, CAD s/p PCI 2009: Continue aspirin and rosuvastatin  Generalized weakness: SNF recommended at discharge  CKD stage IIIb: Creatinine is stable.  Monitor BMP.  Upper and lower extremity edema: Venous duplex of left upper extremity and bilateral lower extremities did not show any evidence of DVT.  Start IV Lasix infusion for diuresis.  2D echo is pending.  Recent admission for acute blood loss anemia due to upper GI bleed from esophageal ulcer..  Continue Protonix   Diet Order            DIET SOFT Room service appropriate? Yes; Fluid consistency: Thin  Diet effective now                    Consultants:  None  Procedures:  None    Medications:   .  aspirin EC  81 mg Oral Daily  . cephALEXin  500 mg Oral Q8H  . insulin aspart  0-5 Units Subcutaneous QHS  . insulin aspart  0-9 Units Subcutaneous TID WC  . levothyroxine  75 mcg Oral Daily  . lidocaine  1 patch Transdermal Daily  . lidocaine  1 patch Transdermal Q24H  . lidocaine  1 patch Transdermal Q24H  . pantoprazole  40 mg Oral BID AC  . potassium chloride  40 mEq Oral Once  . ranolazine   500 mg Oral BID  . rosuvastatin  10 mg Oral Daily  . sodium chloride flush  3 mL Intravenous Q12H  . cyanocobalamin  1,000 mcg Oral Daily   Continuous Infusions: . furosemide (LASIX) 200 mg in dextrose 5% 100 mL (2mg /mL) infusion       Anti-infectives (From admission, onward)   Start     Dose/Rate Route Frequency Ordered Stop   11/25/20 2000  cephALEXin (KEFLEX) capsule 500 mg        500 mg Oral Every 8 hours 11/25/20 1530     11/23/20 2000  cefTRIAXone (ROCEPHIN) 1 g in sodium chloride 0.9 % 100 mL IVPB  Status:  Discontinued        1 g 200 mL/hr over 30 Minutes Intravenous Every 24 hours 11/22/20 2055 11/25/20 1530   11/22/20 2000  cefTRIAXone (ROCEPHIN) 1 g in sodium chloride 0.9 % 100 mL IVPB        1 g 200 mL/hr over 30 Minutes Intravenous  Once 11/22/20 1952 11/23/20 0322             Family Communication/Anticipated D/C date and plan/Code Status   DVT prophylaxis: SCDs Start: 11/22/20 2051     Code Status: DNR  Family Communication: None Disposition Plan:    Status is: Inpatient  Remains inpatient appropriate because:Inpatient level of care appropriate due to severity of illness   Dispo: The patient is from: Home              Anticipated d/c is to: Home              Patient currently is not medically stable to d/c.   Difficult to place patient No                Subjective:   C/o increasing swelling of the left upper extremity.  She still has swelling in the lower legs.  Pain is better.   Objective:    Vitals:   11/27/20 0017 11/27/20 0500 11/27/20 0556 11/27/20 0820  BP: (!) 100/45  128/65 (!) 113/48  Pulse: 88  90 99  Resp: 16  16 16   Temp: 97.8 F (36.6 C)  98.1 F (36.7 C) 98.8 F (37.1 C)  TempSrc: Oral  Oral Oral  SpO2: 98%  99% 99%  Weight:  98.3 kg     No data found.   Intake/Output Summary (Last 24 hours) at 11/27/2020 0933 Last data filed at 11/27/2020 0500 Gross per 24 hour  Intake --  Output 2075 ml  Net  -2075 ml   Filed Weights   11/25/20 0500 11/26/20 0500 11/27/20 0500  Weight: 101.7 kg 99.1 kg 98.3 kg    Exam:  GEN: NAD SKIN: Bruises on bilateral upper extremities EYES: EOMI ENT: MMM CV: RRR PULM: CTA B ABD: soft, obese, NT, +BS CNS: AAO x 3, non focal EXT: Edema of bilateral upper and lower extremities.  Edema of left upper extremity is more significant.  No erythema or  tenderness.      Data Reviewed:   I have personally reviewed following labs and imaging studies:  Labs: Labs show the following:   Basic Metabolic Panel: Recent Labs  Lab 11/22/20 1738 11/23/20 0821 11/23/20 0842 11/23/20 1441 11/24/20 0545 11/26/20 0330  NA 139  --  137  --  137 136  K 2.9*  --  3.0*   < > 3.9 4.3  CL 102  --  101  --  105 102  CO2 26  --  25  --  23 25  GLUCOSE 53*  --  81  --  150* 192*  BUN 14  --  12  --  11 14  CREATININE 1.10*  --  1.14*  --  1.20* 1.20*  CALCIUM 8.7*  --  8.0*  --  7.8* 8.1*  MG 1.6*  --  2.1  --  1.8 2.0  PHOS  --  3.4  --   --   --   --    < > = values in this interval not displayed.   GFR Estimated Creatinine Clearance: 35.3 mL/min (A) (by C-G formula based on SCr of 1.2 mg/dL (H)). Liver Function Tests: Recent Labs  Lab 11/22/20 1738  AST 32  ALT 17  ALKPHOS 80  BILITOT 0.5  PROT 6.4*  ALBUMIN 3.2*   Recent Labs  Lab 11/22/20 1738  LIPASE 21   No results for input(s): AMMONIA in the last 168 hours. Coagulation profile Recent Labs  Lab 11/22/20 1738  INR 1.2    CBC: Recent Labs  Lab 11/22/20 1738 11/23/20 0842 11/24/20 0545 11/26/20 0330  WBC 7.8 9.3 6.2 6.4  NEUTROABS 5.8  --  3.8 4.0  HGB 11.5* 11.1* 9.3* 9.8*  HCT 35.0* 34.7* 28.9* 29.5*  MCV 90.4 93.5 90.6 89.9  PLT 272 236 211 229   Cardiac Enzymes: No results for input(s): CKTOTAL, CKMB, CKMBINDEX, TROPONINI in the last 168 hours. BNP (last 3 results) No results for input(s): PROBNP in the last 8760 hours. CBG: Recent Labs  Lab 11/25/20 2015  11/26/20 1151 11/26/20 1648 11/26/20 2201 11/27/20 0822  GLUCAP 143* 265* 222* 229* 185*   D-Dimer: No results for input(s): DDIMER in the last 72 hours. Hgb A1c: No results for input(s): HGBA1C in the last 72 hours. Lipid Profile: No results for input(s): CHOL, HDL, LDLCALC, TRIG, CHOLHDL, LDLDIRECT in the last 72 hours. Thyroid function studies: No results for input(s): TSH, T4TOTAL, T3FREE, THYROIDAB in the last 72 hours.  Invalid input(s): FREET3 Anemia work up: No results for input(s): VITAMINB12, FOLATE, FERRITIN, TIBC, IRON, RETICCTPCT in the last 72 hours. Sepsis Labs: Recent Labs  Lab 11/22/20 1736 11/22/20 1738 11/23/20 0842 11/24/20 0545 11/26/20 0330  WBC  --  7.8 9.3 6.2 6.4  LATICACIDVEN 2.4*  --  2.4*  --   --     Microbiology Recent Results (from the past 240 hour(s))  Blood culture (routine single)     Status: None   Collection Time: 11/22/20  5:36 PM   Specimen: BLOOD  Result Value Ref Range Status   Specimen Description BLOOD BLOOD RIGHT FOREARM  Final   Special Requests   Final    BOTTLES DRAWN AEROBIC AND ANAEROBIC Blood Culture adequate volume   Culture   Final    NO GROWTH 5 DAYS Performed at [facility], [address], [place], [place] [address]    Report Status 11/27/2020 FINAL  Final  Urine culture  Status: Abnormal   Collection Time: 11/22/20  6:40 PM   Specimen: In/Out Cath Urine  Result Value Ref Range Status   Specimen Description   Final    IN/OUT CATH URINE Performed at [facility], [address] [place], [place] [address]    Special Requests   Final    NONE Performed at [facility], [place], [place] [address]    Culture >=100,000 COLONIES/mL KLEBSIELLA PNEUMONIAE (A)  Final   Report Status 11/25/2020 FINAL  Final   Organism ID, Bacteria KLEBSIELLA PNEUMONIAE (A)  Final      Susceptibility   Klebsiella pneumoniae - MIC*    AMPICILLIN RESISTANT Resistant     CEFAZOLIN  <=4 SENSITIVE Sensitive     CEFEPIME <=0.12 SENSITIVE Sensitive     CEFTRIAXONE <=0.25 SENSITIVE Sensitive     CIPROFLOXACIN <=0.25 SENSITIVE Sensitive     GENTAMICIN <=1 SENSITIVE Sensitive     IMIPENEM <=0.25 SENSITIVE Sensitive     NITROFURANTOIN <=16 SENSITIVE Sensitive     TRIMETH/SULFA <=20 SENSITIVE Sensitive     AMPICILLIN/SULBACTAM <=2 SENSITIVE Sensitive     PIP/TAZO <=4 SENSITIVE Sensitive     * >=100,000 COLONIES/mL KLEBSIELLA PNEUMONIAE  C Difficile Quick Screen w PCR reflex     Status: None   Collection Time: 11/22/20  6:42 PM   Specimen: Urine, Clean Catch; Stool  Result Value Ref Range Status   C Diff antigen NEGATIVE NEGATIVE Final   C Diff toxin NEGATIVE NEGATIVE Final   C Diff interpretation No C. difficile detected.  Final    Comment: Performed at [facility], [address] [place], [place] [address]  Gastrointestinal Panel by PCR , Stool     Status: None   Collection Time: 11/22/20  6:43 PM   Specimen: Urine, Clean Catch; Stool  Result Value Ref Range Status   Campylobacter species NOT DETECTED NOT DETECTED Final   Plesimonas shigelloides NOT DETECTED NOT DETECTED Final   Salmonella species NOT DETECTED NOT DETECTED Final   Yersinia enterocolitica NOT DETECTED NOT DETECTED Final   Vibrio species NOT DETECTED NOT DETECTED Final   Vibrio cholerae NOT DETECTED NOT DETECTED Final   Enteroaggregative E coli (EAEC) NOT DETECTED NOT DETECTED Final   Enteropathogenic E coli (EPEC) NOT DETECTED NOT DETECTED Final   Enterotoxigenic E coli (ETEC) NOT DETECTED NOT DETECTED Final   Shiga like toxin producing E coli (STEC) NOT DETECTED NOT DETECTED Final   Shigella/Enteroinvasive E coli (EIEC) NOT DETECTED NOT DETECTED Final   Cryptosporidium NOT DETECTED NOT DETECTED Final   Cyclospora cayetanensis NOT DETECTED NOT DETECTED Final   Entamoeba histolytica NOT DETECTED NOT DETECTED Final   Giardia lamblia NOT DETECTED NOT DETECTED Final   Adenovirus F40/41 NOT  DETECTED NOT DETECTED Final   Astrovirus NOT DETECTED NOT DETECTED Final   Norovirus GI/GII NOT DETECTED NOT DETECTED Final   Rotavirus A NOT DETECTED NOT DETECTED Final   Sapovirus (I, II, IV, and V) NOT DETECTED NOT DETECTED Final    Comment: Performed at [facility], [address] [place], [place] [address]  Resp Panel by RT-PCR (Flu A&B, Covid) Nasopharyngeal Swab     Status: None   Collection Time: 11/22/20  9:45 PM   Specimen: Nasopharyngeal Swab; Nasopharyngeal(NP) swabs in vial transport medium  Result Value Ref Range Status   SARS Coronavirus 2 by RT PCR NEGATIVE NEGATIVE Final    Comment: (NOTE) SARS-CoV-2 target nucleic acids are NOT DETECTED.  The SARS-CoV-2 RNA  is generally detectable in upper respiratory specimens during the acute phase of infection. The lowest concentration of SARS-CoV-2 viral copies this assay can detect is 138 copies/mL. A negative result does not preclude SARS-Cov-2 infection and should not be used as the sole basis for treatment or other patient management decisions. A negative result may occur with  improper specimen collection/handling, submission of specimen other than nasopharyngeal swab, presence of viral mutation(s) within the areas targeted by this assay, and inadequate number of viral copies(<138 copies/mL). A negative result must be combined with clinical observations, patient history, and epidemiological information. The expected result is Negative.  Fact Sheet for Patients:  EntrepreneurPulse.com.au  Fact Sheet for Healthcare Providers:  IncredibleEmployment.be  This test is no t yet approved or cleared by the [place] FDA and  has been authorized for detection and/or diagnosis of SARS-CoV-2 by FDA under an Emergency Use Authorization (EUA). This EUA will remain  in effect (meaning this test can be used) for the duration of the COVID-19 declaration under Section 564(b)(1) of the  Act, 21 U.S.C.section 360bbb-3(b)(1), unless the authorization is terminated  or revoked sooner.       Influenza A by PCR NEGATIVE NEGATIVE Final   Influenza B by PCR NEGATIVE NEGATIVE Final    Comment: (NOTE) The Xpert Xpress SARS-CoV-2/FLU/RSV plus assay is intended as an aid in the diagnosis of influenza from Nasopharyngeal swab specimens and should not be used as a sole basis for treatment. Nasal washings and aspirates are unacceptable for Xpert Xpress SARS-CoV-2/FLU/RSV testing.  Fact Sheet for Patients: EntrepreneurPulse.com.au  Fact Sheet for Healthcare Providers: IncredibleEmployment.be  This test is not yet approved or cleared by the [place] FDA and has been authorized for detection and/or diagnosis of SARS-CoV-2 by FDA under an Emergency Use Authorization (EUA). This EUA will remain in effect (meaning this test can be used) for the duration of the COVID-19 declaration under Section 564(b)(1) of the Act, 21 U.S.C. section 360bbb-3(b)(1), unless the authorization is terminated or revoked.  Performed at [facility], [address] [place], [place] [address]     Procedures and diagnostic studies:  No results found.             LOS: 4 days   Danila Eddie  [facility]   Pager on [email or web address]. If 7PM-7AM, please contact night-coverage at [email or web address]     11/27/2020, 9:33 AM

## 2020-11-27 NOTE — Progress Notes (Signed)
Inpatient Diabetes Program Recommendations  AACE/ADA: New Consensus Statement on Inpatient Glycemic Control (2015)  Target Ranges:  Prepandial:   less than 140 mg/dL      Peak postprandial:   less than 180 mg/dL (1-2 hours)      Critically ill patients:  140 - 180 mg/dL   Results for Regina [name], Regina [name] (MRN [id number]) as of 11/27/2020 10:05  Ref. Range 11/26/2020 11:51 11/26/2020 16:48 11/26/2020 22:01  Glucose-Capillary Latest Ref Range: 70 - 99 mg/dL 265 (H)  5 units NOVOLOG  222 (H)  3 units NOVOLOG  229 (H)  2 units NOVOLOG    Results for Regina [name], Regina [name] (MRN [id number]) as of 11/27/2020 10:05  Ref. Range 11/27/2020 08:22  Glucose-Capillary Latest Ref Range: 70 - 99 mg/dL 185 (H)    Home DM Meds: Novolog 3 units TID (NOT taking)       Lantus 13 units QHS (NOT taking)       70/30 Insulin 42-52 units BID with meals  Current Orders: Novolog Sensitive Correction Scale/ SSI (0-9 units) TID AC + HS    MD- Note AM CBGs have been elevated--Pt refused CBG check yesterday AM--CBG 185 this AM  Please consider starting low dose basal insulin:  Lantus 10 units QHS (0.1 units/kg)     --Will follow patient during hospitalization--  Wyn Quaker RN, MSN, CDE Diabetes Coordinator Inpatient Glycemic Control Team Team Pager: [phone] (8a-5p)

## 2020-11-28 DIAGNOSIS — N1832 Chronic kidney disease, stage 3b: Secondary | ICD-10-CM

## 2020-11-28 LAB — GLUCOSE, CAPILLARY
Glucose-Capillary: 190 mg/dL — ABNORMAL HIGH (ref 70–99)
Glucose-Capillary: 307 mg/dL — ABNORMAL HIGH (ref 70–99)
Glucose-Capillary: 194 mg/dL — ABNORMAL HIGH (ref 70–99)
Glucose-Capillary: 274 mg/dL — ABNORMAL HIGH (ref 70–99)

## 2020-11-28 LAB — MAGNESIUM: Magnesium: 1.7 mg/dL (ref 1.7–2.4)

## 2020-11-28 LAB — BASIC METABOLIC PANEL
Anion gap: 9 (ref 5–15)
BUN: 13 mg/dL (ref 8–23)
CO2: 28 mmol/L (ref 22–32)
Calcium: 8.6 mg/dL — ABNORMAL LOW (ref 8.9–10.3)
Chloride: 99 mmol/L (ref 98–111)
Creatinine, Ser: 1.21 mg/dL — ABNORMAL HIGH (ref 0.44–1.00)
GFR, Estimated: 44 mL/min — ABNORMAL LOW (ref >60)
Glucose, Bld: 187 mg/dL — ABNORMAL HIGH (ref 70–99)
Potassium: 4.2 mmol/L (ref 3.5–5.1)
Sodium: 136 mmol/L (ref 135–145)

## 2020-11-28 MED ORDER — INSULIN GLARGINE 100 UNIT/ML ~~LOC~~ SOLN
8.0000 [IU] | Freq: Every day | SUBCUTANEOUS | Status: DC
  Administered 2020-11-28 – 2020-11-29 (×2): 8 [IU] via SUBCUTANEOUS
  Filled 2020-11-28 (×3): qty 0.08

## 2020-11-28 NOTE — TOC Progression Note (Signed)
Transition of Care [facility]) - Progression Note    Patient Details  Name: Regina [name] MRN: [id number] Date of Birth: 1933/12/15  [facility] CM/SW Contact  Shelbie Hutching, RN Phone Number: 11/28/2020, 3:11 PM  Clinical Narrative:    Reached out to [facility] today to see if they can offer a bed.  Patient is already in or getting close to being in her copay days.  Audry Pili will confirm and get back to this RNCM.    Expected Discharge Plan: [place] Barriers to Discharge: Continued Medical Work up  Expected Discharge Plan and Services Expected Discharge Plan: [place]   Discharge Planning Services: CM Consult Post Acute Care Choice: [place] Living arrangements for the past 2 months: Single Family Home                 DME Arranged: N/A DME Agency: NA       HH Arranged: NA           Social Determinants of Health (SDOH) Interventions    Readmission Risk Interventions No flowsheet data found.

## 2020-11-28 NOTE — Progress Notes (Addendum)
Progress Note    Regina [name]  FTD:[id number] DOB: 1933/08/24  DOA: 11/22/2020 PCP: Donnie Coffin, MD      Brief Narrative:    Medical records reviewed and are as summarized below:  Regina [name] is a 87 y.o. female with medical history significant for CAD s/p PCI in 2011, paroxysmal atrial fibrillation not on anticoagulation because of history of GI bleed, sick sinus syndrome s/p permanent pacemaker, CKD stage IIIb, insulin-dependent type 2 diabetes mellitus, hypothyroidism, hypertension, hyperlipidemia, morbid obesity, chronic diarrhea, multijoint arthritis involving the knees, lower back and shoulders.  She was recently hospitalized from 10/17/2020 to 10/29/2018 for acute blood loss anemia secondary to upper GI bleed requiring blood transfusion.  EGD on 10/21/2018 showed esophageal ulcer.  She was previously on Plavix but this was discontinued.  She was discharged to the nursing home on that visit but she said she had since been discharged to home from the nursing home where she lives alone.  She presented to the hospital because of generalized weakness and loose stools.  She also complained of recent urinary incontinence and dysuria.  She has chronic low back pain chronic pain in the left arm/shoulder.  She complained of swelling in the lower extremities.  She was found to have hypoglycemia, hypokalemia and acute UTI.  She was treated with IV fluids and antibiotics.  Hypoglycemia and hypokalemia were treated.  Stool for C. difficile toxin was negative.  Venous duplex of the lower extremities was negative for DVT patient was found to have left Baker's cyst.  She was evaluated by PT and OT recommended further rehabilitation_lateral facility.      Assessment/Plan:   Principal Problem:   Generalized weakness Active Problems:   Acute lower UTI   Hypoglycemia   Insulin dependent type 2 diabetes mellitus (HCC)   Hypertension associated with diabetes (HCC)   Paroxysmal atrial  fibrillation (HCC)   CKD (chronic kidney disease), stage III (HCC)   Hyperlipidemia associated with type 2 diabetes mellitus (HCC)   CAD (coronary artery disease)   Hypothyroidism   Hypotension   Body mass index is 42.7 kg/m.    Probable acute UTI/bacteriuria: Urine culture showed Klebsiella pneumoniae.  Keflex will be completed today.  Chronic diarrhea: Unknown if patient has IBS.  Imodium as needed for diarrhea.  Hypotension: BP is soft.  Continue to monitor.  Hypokalemia: Improved.  IDDM with hyperglycemia and recent hypoglycemia: Hemoglobin A1c was 5.6 about a month ago.  Use NovoLog sliding scale as needed for hyperglycemia.  Start low-dose Lantus tonight (8 units nightly)  Paroxysmal atrial fibrillation, sick sinus syndrome s/p permanent pacemaker: Continue aspirin. CHA2DS2-VASC score is 6 but she is not on anticoagulation because of history of GI bleeding.  Hyperlipidemia, CAD s/p PCI 2009: Continue aspirin and rosuvastatin  Generalized weakness: SNF recommended at discharge  CKD stage IIIb: Creatinine is stable.  Monitor BMP.  Upper and lower extremity edema, likely has lymphedema of the left upper extremity from left mastectomy: Venous duplex of left upper extremity and bilateral lower extremities did not show any evidence of DVT.  She has had good urine output with Lasix.  Continue IV Lasix infusion. 2D echo showed EF estimated at 55 to 60%.  LV diastolic parameters were indeterminate.       Recent admission for acute blood loss anemia due to upper GI bleed from esophageal ulcer..  Continue Protonix   Diet Order            DIET SOFT  Room service appropriate? Yes; Fluid consistency: Thin  Diet effective now                    Consultants:  None  Procedures:  None    Medications:   . aspirin EC  81 mg Oral Daily  . cephALEXin  500 mg Oral Q8H  . insulin aspart  0-5 Units Subcutaneous QHS  . insulin aspart  0-9 Units Subcutaneous TID WC  .  insulin glargine  8 Units Subcutaneous QHS  . levothyroxine  75 mcg Oral Daily  . lidocaine  1 patch Transdermal Daily  . lidocaine  1 patch Transdermal Q24H  . lidocaine  1 patch Transdermal Q24H  . pantoprazole  40 mg Oral BID AC  . potassium chloride  40 mEq Oral Once  . ranolazine  500 mg Oral BID  . rosuvastatin  10 mg Oral Daily  . sodium chloride flush  3 mL Intravenous Q12H  . cyanocobalamin  1,000 mcg Oral Daily   Continuous Infusions: . furosemide (LASIX) 200 mg in dextrose 5% 100 mL (2mg /mL) infusion 4 mg/hr (11/28/20 0531)     Anti-infectives (From admission, onward)   Start     Dose/Rate Route Frequency Ordered Stop   11/25/20 2000  cephALEXin (KEFLEX) capsule 500 mg        500 mg Oral Every 8 hours 11/25/20 1530     11/23/20 2000  cefTRIAXone (ROCEPHIN) 1 g in sodium chloride 0.9 % 100 mL IVPB  Status:  Discontinued        1 g 200 mL/hr over 30 Minutes Intravenous Every 24 hours 11/22/20 2055 11/25/20 1530   11/22/20 2000  cefTRIAXone (ROCEPHIN) 1 g in sodium chloride 0.9 % 100 mL IVPB        1 g 200 mL/hr over 30 Minutes Intravenous  Once 11/22/20 1952 11/23/20 0322             Family Communication/Anticipated D/C date and plan/Code Status   DVT prophylaxis: SCDs Start: 11/22/20 2051     Code Status: DNR  Family Communication: None Disposition Plan:    Status is: Inpatient  Remains inpatient appropriate because:Inpatient level of care appropriate due to severity of illness   Dispo: The patient is from: Home              Anticipated d/c is to: Home              Patient currently is not medically stable to d/c.   Difficult to place patient No                Subjective:   She still complains of swelling in the upper and lower extremities.  She was frustrated that she was urinating a lot but fluid was still present in the arms and legs.  No shortness of breath or chest pain.    Objective:    Vitals:   11/28/20 0222 11/28/20  0500 11/28/20 0632 11/28/20 0913  BP: 121/70  114/60 (!) 94/54  Pulse: 84  (!) 103 88  Resp: 16  18 16   Temp: 98.7 F (37.1 C)  98.1 F (36.7 C) 98.1 F (36.7 C)  TempSrc: Oral  Oral Oral  SpO2: 96%  98% 97%  Weight:  95.9 kg     No data found.   Intake/Output Summary (Last 24 hours) at 11/28/2020 1126 Last data filed at 11/28/2020 0718 Gross per 24 hour  Intake 453.71 ml  Output 2700 ml  Net -2246.29  ml   Filed Weights   11/26/20 0500 11/27/20 0500 11/28/20 0500  Weight: 99.1 kg 98.3 kg 95.9 kg    Exam:  GEN: NAD SKIN: Bruises on bilateral upper extremities. EYES: No pallor or icterus ENT: MMM CV: RRR PULM: CTA B ABD: soft, obese, NT, +BS CNS: AAO x 3, non focal EXT: Pitting edema of bilateral legs.  Edema of left upper extremity.  No erythema or tenderness.       Data Reviewed:   I have personally reviewed following labs and imaging studies:  Labs: Labs show the following:   Basic Metabolic Panel: Recent Labs  Lab 11/23/20 0821 11/23/20 0842 11/23/20 1441 11/24/20 0545 11/26/20 0330 11/27/20 0913 11/28/20 0521  NA  --  137  --  137 136 133* 136  K  --  3.0*   < > 3.9 4.3 4.3 4.2  CL  --  101  --  105 102 99 99  CO2  --  25  --  23 25 25 28   GLUCOSE  --  81  --  150* 192* 207* 187*  BUN  --  12  --  11 14 13 13   CREATININE  --  1.14*  --  1.20* 1.20* 1.16* 1.21*  CALCIUM  --  8.0*  --  7.8* 8.1* 8.4* 8.6*  MG  --  2.1  --  1.8 2.0 1.8 1.7  PHOS 3.4  --   --   --   --   --   --    < > = values in this interval not displayed.   GFR Estimated Creatinine Clearance: 34.5 mL/min (A) (by C-G formula based on SCr of 1.21 mg/dL (H)). Liver Function Tests: Recent Labs  Lab 11/22/20 1738  AST 32  ALT 17  ALKPHOS 80  BILITOT 0.5  PROT 6.4*  ALBUMIN 3.2*   Recent Labs  Lab 11/22/20 1738  LIPASE 21   No results for input(s): AMMONIA in the last 168 hours. Coagulation profile Recent Labs  Lab 11/22/20 1738  INR 1.2    CBC: Recent Labs   Lab 11/22/20 1738 11/23/20 0842 11/24/20 0545 11/26/20 0330  WBC 7.8 9.3 6.2 6.4  NEUTROABS 5.8  --  3.8 4.0  HGB 11.5* 11.1* 9.3* 9.8*  HCT 35.0* 34.7* 28.9* 29.5*  MCV 90.4 93.5 90.6 89.9  PLT 272 236 211 229   Cardiac Enzymes: No results for input(s): CKTOTAL, CKMB, CKMBINDEX, TROPONINI in the last 168 hours. BNP (last 3 results) No results for input(s): PROBNP in the last 8760 hours. CBG: Recent Labs  Lab 11/27/20 0822 11/27/20 1232 11/27/20 1536 11/27/20 2117 11/28/20 0906  GLUCAP 185* 185* 193* 238* 190*   D-Dimer: No results for input(s): DDIMER in the last 72 hours. Hgb A1c: No results for input(s): HGBA1C in the last 72 hours. Lipid Profile: No results for input(s): CHOL, HDL, LDLCALC, TRIG, CHOLHDL, LDLDIRECT in the last 72 hours. Thyroid function studies: No results for input(s): TSH, T4TOTAL, T3FREE, THYROIDAB in the last 72 hours.  Invalid input(s): FREET3 Anemia work up: No results for input(s): VITAMINB12, FOLATE, FERRITIN, TIBC, IRON, RETICCTPCT in the last 72 hours. Sepsis Labs: Recent Labs  Lab 11/22/20 1736 11/22/20 1738 11/23/20 0842 11/24/20 0545 11/26/20 0330  WBC  --  7.8 9.3 6.2 6.4  LATICACIDVEN 2.4*  --  2.4*  --   --     Microbiology Recent Results (from the past 240 hour(s))  Blood culture (routine single)     Status: None  Collection Time: 11/22/20  5:36 PM   Specimen: BLOOD  Result Value Ref Range Status   Specimen Description BLOOD BLOOD RIGHT FOREARM  Final   Special Requests   Final    BOTTLES DRAWN AEROBIC AND ANAEROBIC Blood Culture adequate volume   Culture   Final    NO GROWTH 5 DAYS Performed at [facility], [address] [place], [place] [address]    Report Status 11/27/2020 FINAL  Final  Urine culture     Status: Abnormal   Collection Time: 11/22/20  6:40 PM   Specimen: In/Out Cath Urine  Result Value Ref Range Status   Specimen Description   Final    IN/OUT CATH URINE Performed at [facility], [address] [place], [place] [address]    Special Requests   Final    NONE Performed at [facility], [address] [place], [place] [address]    Culture >=100,000 COLONIES/mL KLEBSIELLA PNEUMONIAE (A)  Final   Report Status 11/25/2020 FINAL  Final   Organism ID, Bacteria KLEBSIELLA PNEUMONIAE (A)  Final      Susceptibility   Klebsiella pneumoniae - MIC*    AMPICILLIN RESISTANT Resistant     CEFAZOLIN <=4 SENSITIVE Sensitive     CEFEPIME <=0.12 SENSITIVE Sensitive     CEFTRIAXONE <=0.25 SENSITIVE Sensitive     CIPROFLOXACIN <=0.25 SENSITIVE Sensitive     GENTAMICIN <=1 SENSITIVE Sensitive     IMIPENEM <=0.25 SENSITIVE Sensitive     NITROFURANTOIN <=16 SENSITIVE Sensitive     TRIMETH/SULFA <=20 SENSITIVE Sensitive     AMPICILLIN/SULBACTAM <=2 SENSITIVE Sensitive     PIP/TAZO <=4 SENSITIVE Sensitive     * >=100,000 COLONIES/mL KLEBSIELLA PNEUMONIAE  C Difficile Quick Screen w PCR reflex     Status: None   Collection Time: 11/22/20  6:42 PM   Specimen: Urine, Clean Catch; Stool  Result Value Ref Range Status   C Diff antigen NEGATIVE NEGATIVE Final   C Diff toxin NEGATIVE NEGATIVE Final   C Diff interpretation No C. difficile detected.  Final    Comment: Performed at [facility], [address] [place], [place] [address]  Gastrointestinal Panel by PCR , Stool     Status: None   Collection Time: 11/22/20  6:43 PM   Specimen: Urine, Clean Catch; Stool  Result Value Ref Range Status   Campylobacter species NOT DETECTED NOT DETECTED Final   Plesimonas shigelloides NOT DETECTED NOT DETECTED Final   Salmonella species NOT DETECTED NOT DETECTED Final   Yersinia enterocolitica NOT DETECTED NOT DETECTED Final   Vibrio species NOT DETECTED NOT DETECTED Final   Vibrio cholerae NOT DETECTED NOT DETECTED Final   Enteroaggregative E coli (EAEC) NOT DETECTED NOT DETECTED Final   Enteropathogenic E coli (EPEC) NOT DETECTED NOT DETECTED Final    Enterotoxigenic E coli (ETEC) NOT DETECTED NOT DETECTED Final   Shiga like toxin producing E coli (STEC) NOT DETECTED NOT DETECTED Final   Shigella/Enteroinvasive E coli (EIEC) NOT DETECTED NOT DETECTED Final   Cryptosporidium NOT DETECTED NOT DETECTED Final   Cyclospora cayetanensis NOT DETECTED NOT DETECTED Final   Entamoeba histolytica NOT DETECTED NOT DETECTED Final   Giardia lamblia NOT DETECTED NOT DETECTED Final   Adenovirus F40/41 NOT DETECTED NOT DETECTED Final   Astrovirus NOT DETECTED NOT DETECTED Final   Norovirus GI/GII NOT DETECTED NOT DETECTED Final   Rotavirus A NOT DETECTED NOT DETECTED Final   Sapovirus (I, II, IV, and V) NOT DETECTED NOT  DETECTED Final    Comment: Performed at [facility], [address] [place], [place] [address]  Resp Panel by RT-PCR (Flu A&B, Covid) Nasopharyngeal Swab     Status: None   Collection Time: 11/22/20  9:45 PM   Specimen: Nasopharyngeal Swab; Nasopharyngeal(NP) swabs in vial transport medium  Result Value Ref Range Status   SARS Coronavirus 2 by RT PCR NEGATIVE NEGATIVE Final    Comment: (NOTE) SARS-CoV-2 target nucleic acids are NOT DETECTED.  The SARS-CoV-2 RNA is generally detectable in upper respiratory specimens during the acute phase of infection. The lowest concentration of SARS-CoV-2 viral copies this assay can detect is 138 copies/mL. A negative result does not preclude SARS-Cov-2 infection and should not be used as the sole basis for treatment or other patient management decisions. A negative result may occur with  improper specimen collection/handling, submission of specimen other than nasopharyngeal swab, presence of viral mutation(s) within the areas targeted by this assay, and inadequate number of viral copies(<138 copies/mL). A negative result must be combined with clinical observations, patient history, and epidemiological information. The expected result is Negative.  Fact Sheet for Patients:   EntrepreneurPulse.com.au  Fact Sheet for Healthcare Providers:  IncredibleEmployment.be  This test is no t yet approved or cleared by the [place] FDA and  has been authorized for detection and/or diagnosis of SARS-CoV-2 by FDA under an Emergency Use Authorization (EUA). This EUA will remain  in effect (meaning this test can be used) for the duration of the COVID-19 declaration under Section 564(b)(1) of the Act, 21 U.S.C.section 360bbb-3(b)(1), unless the authorization is terminated  or revoked sooner.       Influenza A by PCR NEGATIVE NEGATIVE Final   Influenza B by PCR NEGATIVE NEGATIVE Final    Comment: (NOTE) The Xpert Xpress SARS-CoV-2/FLU/RSV plus assay is intended as an aid in the diagnosis of influenza from Nasopharyngeal swab specimens and should not be used as a sole basis for treatment. Nasal washings and aspirates are unacceptable for Xpert Xpress SARS-CoV-2/FLU/RSV testing.  Fact Sheet for Patients: EntrepreneurPulse.com.au  Fact Sheet for Healthcare Providers: IncredibleEmployment.be  This test is not yet approved or cleared by the [place] FDA and has been authorized for detection and/or diagnosis of SARS-CoV-2 by FDA under an Emergency Use Authorization (EUA). This EUA will remain in effect (meaning this test can be used) for the duration of the COVID-19 declaration under Section 564(b)(1) of the Act, 21 U.S.C. section 360bbb-3(b)(1), unless the authorization is terminated or revoked.  Performed at [facility], [address] [place], [place] [address]     Procedures and diagnostic studies:  ECHOCARDIOGRAM COMPLETE  Result Date: 11/27/2020    ECHOCARDIOGRAM REPORT   Patient Name:   ALLESANDRA HUEBSCH Date of Exam: 11/27/2020 Medical Rec #:  [id number]     Height:       59.0 in Accession #:    [id number]    Weight:       216.7 lb Date of Birth:  22-May-1934     BSA:           1.908 m Patient Age:    53 years      BP:           113/48 mmHg Patient Gender: F             HR:           124 bpm. Exam Location:  [facility] Procedure: 2D Echo, Color Doppler and Cardiac Doppler Indications:  I50.31 CHF-Acute Diastolic  History:         Patient has no prior history of Echocardiogram examinations.                  CAD, Pacemaker; Risk Factors:Hypertension, Dyslipidemia and                  Diabetes.  Sonographer:     Charmayne Sheer RDCS (AE) Referring Phys:  [place] Diagnosing Phys: Kathlyn Sacramento MD  Sonographer Comments: Technically difficult study due to poor echo windows. Image acquisition challenging due to patient body habitus and Image acquisition challenging due to mastectomy. IMPRESSIONS  1. Left ventricular ejection fraction, by estimation, is 55 to 60%. The left ventricle has normal function. Left ventricular endocardial border not optimally defined to evaluate regional wall motion. Left ventricular diastolic parameters are indeterminate.  2. Right ventricular systolic function is normal. The right ventricular size is normal. There is mildly elevated pulmonary artery systolic pressure.  3. Left atrial size was mildly dilated.  4. The mitral valve is normal in structure. No evidence of mitral valve regurgitation. No evidence of mitral stenosis.  5. The aortic valve is normal in structure. Aortic valve regurgitation is trivial. Mild aortic valve sclerosis is present, with no evidence of aortic valve stenosis.  6. The inferior vena cava is normal in size with greater than 50% respiratory variability, suggesting right atrial pressure of 3 mmHg. FINDINGS  Left Ventricle: Left ventricular ejection fraction, by estimation, is 55 to 60%. The left ventricle has normal function. Left ventricular endocardial border not optimally defined to evaluate regional wall motion. The left ventricular internal cavity size was normal in size. There is no left ventricular hypertrophy. Left  ventricular diastolic parameters are indeterminate. Right Ventricle: The right ventricular size is normal. No increase in right ventricular wall thickness. Right ventricular systolic function is normal. There is mildly elevated pulmonary artery systolic pressure. The tricuspid regurgitant velocity is 2.82  m/s, and with an assumed right atrial pressure of 3 mmHg, the estimated right ventricular systolic pressure is 03.0 mmHg. Left Atrium: Left atrial size was mildly dilated. Right Atrium: Right atrial size was normal in size. Pericardium: There is no evidence of pericardial effusion. Mitral Valve: The mitral valve is normal in structure. No evidence of mitral valve regurgitation. No evidence of mitral valve stenosis. MV peak gradient, 6.2 mmHg. The mean mitral valve gradient is 2.0 mmHg. Tricuspid Valve: The tricuspid valve is normal in structure. Tricuspid valve regurgitation is mild . No evidence of tricuspid stenosis. Aortic Valve: The aortic valve is normal in structure. Aortic valve regurgitation is trivial. Mild aortic valve sclerosis is present, with no evidence of aortic valve stenosis. Aortic valve mean gradient measures 3.0 mmHg. Aortic valve peak gradient measures 5.7 mmHg. Aortic valve area, by VTI measures 1.44 cm. Pulmonic Valve: The pulmonic valve was normal in structure. Pulmonic valve regurgitation is not visualized. No evidence of pulmonic stenosis. Aorta: The aortic root is normal in size and structure. Venous: The inferior vena cava is normal in size with greater than 50% respiratory variability, suggesting right atrial pressure of 3 mmHg. IAS/Shunts: No atrial level shunt detected by color flow Doppler. Additional Comments: A device lead is visualized.  LEFT VENTRICLE PLAX 2D LVIDd:         4.70 cm  Diastology LVIDs:         3.30 cm  LV e' medial:    6.96 cm/s LV PW:  1.00 cm  LV E/e' medial:  16.4 LV IVS:        0.90 cm  LV e' lateral:   6.42 cm/s LVOT diam:     1.90 cm  LV E/e'  lateral: 17.8 LV SV:         32 LV SV Index:   17 LVOT Area:     2.84 cm  LEFT ATRIUM             Index LA diam:        4.00 cm 2.10 cm/m LA Vol (A2C):   57.4 ml 30.08 ml/m LA Vol (A4C):   58.9 ml 30.86 ml/m LA Biplane Vol: 60.7 ml 31.81 ml/m  AORTIC VALVE                   PULMONIC VALVE AV Area (Vmax):    1.88 cm    PV Vmax:       1.03 m/s AV Area (Vmean):   1.66 cm    PV Vmean:      66.700 cm/s AV Area (VTI):     1.44 cm    PV VTI:        0.167 m AV Vmax:           119.00 cm/s PV Peak grad:  4.2 mmHg AV Vmean:          82.100 cm/s PV Mean grad:  2.0 mmHg AV VTI:            0.225 m AV Peak Grad:      5.7 mmHg AV Mean Grad:      3.0 mmHg LVOT Vmax:         79.00 cm/s LVOT Vmean:        48.000 cm/s LVOT VTI:          0.114 m LVOT/AV VTI ratio: 0.51  AORTA Ao Root diam: 3.00 cm MITRAL VALVE                TRICUSPID VALVE MV Area (PHT): 4.79 cm     TR Peak grad:   31.8 mmHg MV Area VTI:   1.86 cm     TR Vmax:        282.00 cm/s MV Peak grad:  6.2 mmHg MV Mean grad:  2.0 mmHg     SHUNTS MV Vmax:       1.25 m/s     Systemic VTI:  0.11 m MV Vmean:      62.9 cm/s    Systemic Diam: 1.90 cm MV Decel Time: 158 msec MV E velocity: 114.33 cm/s Kathlyn Sacramento MD Electronically signed by Kathlyn Sacramento MD Signature Date/Time: 11/27/2020/4:39:40 PM    Final                LOS: 5 days   Calirose Mccance  [facility]   Pager on [email or web address]. If 7PM-7AM, please contact night-coverage at [email or web address]     11/28/2020, 11:26 AM

## 2020-11-28 NOTE — Progress Notes (Signed)
OT Cancellation Note  Patient Details Name: Regina [name] MRN: [id number] DOB: [birth year]   Cancelled Treatment:    Reason Eval/Treat Not Completed: Patient declined, no reason specified.  Upon attempt, pt with nurse tech to get vitals. Pt declines OT despite encouragement, citing wish to finish breakfast. OT offered to improve her positioning for eating and pt declined. LUE noted to be edematous. With encouragement, pt allowed OT to reposition LUE on pillow to support edema control. Pt reports RN aware of edema. Will re-attempt OT tx at later date/time as pt is agreeable.  Hanley Hays, MPH, MS, OTR/L ascom [phone] 11/28/20, 9:11 AM

## 2020-11-28 NOTE — TOC Progression Note (Signed)
Transition of Care [facility]) - Progression Note    Patient Details  Name: Regina [name] MRN: [id number] Date of Birth: [birth year]  [facility] CM/SW Contact  Shelbie Hutching, RN Phone Number: 11/28/2020, 4:53 PM  Clinical Narrative:    [facility] cannot accept patient, she is in her copay days and they do not accept Medicaid Pending patients.  TOC will follow up with patient's daughter tomorrow about other bed offers.    Expected Discharge Plan: [place] Barriers to Discharge: Continued Medical Work up  Expected Discharge Plan and Services Expected Discharge Plan: [place]   Discharge Planning Services: CM Consult Post Acute Care Choice: [place] Living arrangements for the past 2 months: Single Family Home                 DME Arranged: N/A DME Agency: NA       HH Arranged: NA           Social Determinants of Health (SDOH) Interventions    Readmission Risk Interventions No flowsheet data found.

## 2020-11-28 NOTE — Care Management Important Message (Signed)
Important Message  Patient Details  Name: Regina [name] MRN: [id number] Date of Birth: 10-30-1933   Medicare Important Message Given:  Yes     Juliann Pulse A Lovely Kerins 11/28/2020, 3:27 PM

## 2020-11-28 NOTE — Progress Notes (Signed)
Physical Therapy Treatment Patient Details Name: Regina [name] MRN: [id number] DOB: 05-21-1934 Today's Date: 11/28/2020    History of Present Illness Regina [name] is a 87 y.o. female with medical history significant for CAD s/p PCI 2009, PAF not on anticoagulation due to history of GI bleed, SSS s/p PPM, CKD stage III, IDT2DM, hypothyroidism, hypertension, hyperlipidemia, and morbid obesity who presents to the ED for evaluation of generalized weakness and loose stools. She reports a recent urinary incontinence with dysuria. She is having pain in her back and left arm. She reports new swelling in both of her lower extremities.  She denies any nausea, vomiting, abdominal pain, chest pain, or dyspnea.    PT Comments    Pt was not overly excited to work with PT, but did agree once pt was able to convince her about the importance, reasoning, benefit, etc.  She did assist with getting to EOB and did not need excessive assist with getting to standing but lost control of her bladder and that was too much for her and she was done with PT.  Heavy assist getting back to supine and scooting up in bed, which she was able to help with but still required heavy assist.  Pt is not safe to return home, will need STR once ready for d/c.    Follow Up Recommendations  SNF     Equipment Recommendations  None recommended by PT    Recommendations for Other Services       Precautions / Restrictions Precautions Precautions: Fall Restrictions Weight Bearing Restrictions: No    Mobility  Bed Mobility Overal bed mobility: Needs Assistance Bed Mobility: Rolling;Supine to Sit;Sit to Supine Rolling: Min assist   Supine to sit: Min assist;Mod assist Sit to supine: Min assist;Max assist   General bed mobility comments: Pt was able to assist getting to EOB but ultimately was limited and did need assist with all transitions    Transfers Overall transfer level: Needs assistance Equipment used: Rolling walker  (2 wheeled) Transfers: Sit to/from Stand Sit to Stand: Min assist         General transfer comment: Pt was able to rise to standing w/o heavy assist, however as soon as we were upright she lost control of her bladder, became quite agitated and we returned to the bed (instead of recliner as planned)  Ambulation/Gait                 Stairs             Wheelchair Mobility    Modified Rankin (Stroke Patients Only)       Balance Overall balance assessment: Needs assistance Sitting-balance support: Single extremity supported;Feet unsupported Sitting balance-Leahy Scale: Good     Standing balance support: Bilateral upper extremity supported Standing balance-Leahy Scale: Fair Standing balance comment: BUE support on RW during brief standing effort                            Cognition Arousal/Alertness: Awake/alert Behavior During Therapy: Anxious;Agitated Overall Cognitive Status: Within Functional Limits for tasks assessed                                        Exercises      General Comments        Pertinent Vitals/Pain Pain Assessment: 0-10 Pain Score: 4  Pain Location: c/o  general pain all over, mostly focusing on back    Home Living                      Prior Function            PT Goals (current goals can now be found in the care plan section) Progress towards PT goals: Progressing toward goals    Frequency    Min 2X/week      PT Plan Current plan remains appropriate    Co-evaluation              AM-PAC PT "6 Clicks" Mobility   Outcome Measure  Help needed turning from your back to your side while in a flat bed without using bedrails?: A Lot Help needed moving from lying on your back to sitting on the side of a flat bed without using bedrails?: A Lot Help needed moving to and from a bed to a chair (including a wheelchair)?: A Lot Help needed standing up from a chair using your arms  (e.g., wheelchair or bedside chair)?: A Little Help needed to walk in hospital room?: A Lot Help needed climbing 3-5 steps with a railing? : Total 6 Click Score: 12    End of Session Equipment Utilized During Treatment: Gait belt Activity Tolerance: Other (comment) (incontinence during standing) Patient left: with bed alarm set;with call bell/phone within reach Nurse Communication: Mobility status (need for clean up) PT Visit Diagnosis: Muscle weakness (generalized) (M62.81);Difficulty in walking, not elsewhere classified (R26.2);Unsteadiness on feet (R26.81)     Time: 7829-5621 PT Time Calculation (min) (ACUTE ONLY): 28 min  Charges:  $Therapeutic Activity: 23-37 mins                     Kreg Shropshire, DPT 11/28/2020, 3:50 PM

## 2020-11-29 LAB — CBC
HCT: 30.9 % — ABNORMAL LOW (ref 36.0–46.0)
Hemoglobin: 10 g/dL — ABNORMAL LOW (ref 12.0–15.0)
MCH: 29.1 pg (ref 26.0–34.0)
MCHC: 32.4 g/dL (ref 30.0–36.0)
MCV: 89.8 fL (ref 80.0–100.0)
Platelets: 238 10*3/uL (ref 150–400)
RBC: 3.44 MIL/uL — ABNORMAL LOW (ref 3.87–5.11)
RDW: 14.2 % (ref 11.5–15.5)
WBC: 7.5 10*3/uL (ref 4.0–10.5)
nRBC: 0 % (ref 0.0–0.2)

## 2020-11-29 LAB — BASIC METABOLIC PANEL
Anion gap: 9 (ref 5–15)
BUN: 16 mg/dL (ref 8–23)
CO2: 27 mmol/L (ref 22–32)
Calcium: 8.3 mg/dL — ABNORMAL LOW (ref 8.9–10.3)
Chloride: 99 mmol/L (ref 98–111)
Creatinine, Ser: 1.29 mg/dL — ABNORMAL HIGH (ref 0.44–1.00)
GFR, Estimated: 41 mL/min — ABNORMAL LOW (ref >60)
Glucose, Bld: 216 mg/dL — ABNORMAL HIGH (ref 70–99)
Potassium: 4.2 mmol/L (ref 3.5–5.1)
Sodium: 135 mmol/L (ref 135–145)

## 2020-11-29 LAB — GLUCOSE, CAPILLARY
Glucose-Capillary: 177 mg/dL — ABNORMAL HIGH (ref 70–99)
Glucose-Capillary: 202 mg/dL — ABNORMAL HIGH (ref 70–99)
Glucose-Capillary: 259 mg/dL — ABNORMAL HIGH (ref 70–99)
Glucose-Capillary: 237 mg/dL — ABNORMAL HIGH (ref 70–99)

## 2020-11-29 LAB — MAGNESIUM: Magnesium: 1.8 mg/dL (ref 1.7–2.4)

## 2020-11-29 MED ORDER — FUROSEMIDE 10 MG/ML IJ SOLN
40.0000 mg | Freq: Two times a day (BID) | INTRAMUSCULAR | Status: DC
  Administered 2020-11-29: 18:00:00 40 mg via INTRAVENOUS
  Filled 2020-11-29: qty 4

## 2020-11-29 MED ORDER — POLYETHYLENE GLYCOL 3350 17 G PO PACK
17.0000 g | PACK | Freq: Every day | ORAL | Status: DC
  Administered 2020-11-29: 17 g via ORAL
  Filled 2020-11-29 (×2): qty 1

## 2020-11-29 MED ORDER — ENOXAPARIN SODIUM 30 MG/0.3ML ~~LOC~~ SOLN: 30.0000 mg | SUBCUTANEOUS | Status: DC

## 2020-11-29 MED ORDER — DOCUSATE SODIUM 100 MG PO CAPS
200.0000 mg | ORAL_CAPSULE | Freq: Two times a day (BID) | ORAL | Status: DC
  Administered 2020-11-29: 09:00:00 200 mg via ORAL
  Filled 2020-11-29 (×2): qty 2

## 2020-11-29 NOTE — Progress Notes (Signed)
OT Cancellation Note  Patient Details Name: Regina [name] MRN: [id number] DOB: 08/23/1933   Cancelled Treatment:    Reason Eval/Treat Not Completed: Patient declined, no reason specified. [name] did not want to participate in therapy today. She stated she was too tired, and also that when she did PT a few days ago, she accidentally urinated on the floor, so is now afraid to try again. Therapist provided encouragement, but could not persuade pt. She reports that she is transferring tomorrow to a SNF, and that she will perform OOB mobility when she gets there.  Josiah Lobo 11/29/2020, 4:17 PM

## 2020-11-29 NOTE — Plan of Care (Addendum)
Shift Summary: Patient orientedx4, VSS, remains on RA. Lasix gtt continued per [facility], adequate UO overnight. No BM this shift and for several days, reports feeling urge to go. C/o chronic pain overnight addressed with lidocaine patches and PRN tylenol. Fall/safety precautions in place, rounding performed, needs/concerns addressed during shift.  Problem: Education: Goal: Knowledge of General Education information will improve Description: Including pain rating scale, medication(s)/side effects and non-pharmacologic comfort measures Outcome: Progressing   Problem: Health Behavior/Discharge Planning: Goal: Ability to manage health-related needs will improve Outcome: Progressing   Problem: Clinical Measurements: Goal: Ability to maintain clinical measurements within normal limits will improve Outcome: Progressing Goal: Will remain free from infection Outcome: Progressing Goal: Diagnostic test results will improve Outcome: Progressing Goal: Respiratory complications will improve Outcome: Progressing Goal: Cardiovascular complication will be avoided Outcome: Progressing   Problem: Activity: Goal: Risk for activity intolerance will decrease Outcome: Progressing   Problem: Nutrition: Goal: Adequate nutrition will be maintained Outcome: Progressing   Problem: Coping: Goal: Level of anxiety will decrease Outcome: Progressing   Problem: Elimination: Goal: Will not experience complications related to bowel motility Outcome: Progressing Goal: Will not experience complications related to urinary retention Outcome: Progressing   Problem: Pain Managment: Goal: General experience of comfort will improve Outcome: Progressing   Problem: Safety: Goal: Ability to remain free from injury will improve Outcome: Progressing   Problem: Skin Integrity: Goal: Risk for impaired skin integrity will decrease Outcome: Progressing

## 2020-11-29 NOTE — TOC Progression Note (Signed)
Transition of Care [facility]) - Progression Note    Patient Details  Name: Regina [name] MRN: [id number] Date of Birth: 29-Mar-1934  [facility] CM/SW Contact  Shelbie Hutching, RN Phone Number: 11/29/2020, 5:12 PM  Clinical Narrative:    Insurance authorization approved for SNF Auth ID [id number] Reference # [id number] Approved 4/20-4/22   Expected Discharge Plan: Skilled Nursing Facility Barriers to Discharge: Continued Medical Work up  Expected Discharge Plan and Services Expected Discharge Plan: [place]   Discharge Planning Services: CM Consult Post Acute Care Choice: [place] Living arrangements for the past 2 months: Single Family Home                 DME Arranged: N/A DME Agency: NA       HH Arranged: NA           Social Determinants of Health (SDOH) Interventions    Readmission Risk Interventions No flowsheet data found.

## 2020-11-29 NOTE — Progress Notes (Signed)
PROGRESS NOTE    Regina [name]  STM:[id number] DOB: 24-Oct-1933 DOA: 11/22/2020 PCP: Donnie Coffin, MD   Assessment & Plan:   Principal Problem:   Generalized weakness Active Problems:   Acute lower UTI   Hypoglycemia   Insulin dependent type 2 diabetes mellitus ([place])   Hypertension associated with diabetes ([place])   Paroxysmal atrial fibrillation (HCC)   CKD (chronic kidney disease), stage III ([place])   Hyperlipidemia associated with type 2 diabetes mellitus ([place])   CAD (coronary artery disease)   Hypothyroidism   Hypotension    Probable UTI/bacteriuria: urine cx grew Klebsiella pneumoniae. Completed abx course   Chronic diarrhea: etiology unclear, possible IBS.  Imodium prn  Hypotension: keep MAP >65. Hold anti-HTN meds   Hypokalemia: WNL today   DM2: well controlled w/ HbA1c 5.6. Continue on lantus, SSI w/ accuchecks   PAF: continue on aspirin. Not on anticoagulation secondary to hx of GI bleeding  Sick sinus syndrome: s/p permanent pacemaker.   HLD: continue on statin  CAD:  s/p PCI 2009. Continue on aspirin, statin   Generalized weakness: PT/OT recs SNF   CKDIIIb: Cr is labile. Will continue to monitor   ACD: likely secondary to CKD. No need for a transfusion currently   B/l LE edema: possible lymphedema. D/c IV lasix drip and start IV lasix. Monitor I/Os. [place] of b/l LE neg for DVT  Hx of esophageal ulcer: continue on PPI. Hx of recent GI bleed   Morbid obesity: BMI 42.2. Complicates overall care and prognosis  DVT prophylaxis: SCDs (secondary to recent GI bleed)  Code Status: DNR Family Communication:  Disposition Plan: likely d/c to SNF   Level of care: Med-Surg   Status is: Inpatient  Remains inpatient appropriate because:Unsafe d/c plan and IV treatments appropriate due to intensity of illness or inability to take PO   Dispo: The patient is from: Home              Anticipated d/c is to: SNF              Patient currently is not  medically stable to d/c.   Difficult to place patient Yes    Consultants:     Procedures:    Antimicrobials:    Subjective: Pt c/o b/l foot swelling   Objective: Vitals:   11/28/20 1736 11/28/20 2026 11/28/20 2350 11/29/20 0342  BP: 135/61 (!) 122/44 (!) 122/50 (!) 103/49  Pulse: (!) 110 98 94 98  Resp: 20 18 17 19   Temp: 98.5 F (36.9 C) 98.3 F (36.8 C) 97.7 F (36.5 C) 98.3 F (36.8 C)  TempSrc: Oral Oral Oral Oral  SpO2: 99% 100% 100% 97%  Weight:    95 kg  Height:  4' 11.02" (1.499 m)      Intake/Output Summary (Last 24 hours) at 11/29/2020 0720 Last data filed at 11/29/2020 0344 Gross per 24 hour  Intake 294.44 ml  Output 950 ml  Net -655.56 ml   Filed Weights   11/27/20 0500 11/28/20 0500 11/29/20 0342  Weight: 98.3 kg 95.9 kg 95 kg    Examination:  General exam: Appears calm and comfortable  Respiratory system: Clear to auscultation. Respiratory effort normal. Cardiovascular system: S1 & S2 +. No rubs, gallops or clicks. 2+ b/l LE pitting edema  Gastrointestinal system: Abdomen is obese, soft and nontender. Normal bowel sounds heard. Central nervous system: Alert and oriented. Moves all extremities  Psychiatry: Judgement and insight appear normal. Flat mood and affect  Data Reviewed: I have personally reviewed following labs and imaging studies  CBC: Recent Labs  Lab 11/22/20 1738 11/23/20 0842 11/24/20 0545 11/26/20 0330  WBC 7.8 9.3 6.2 6.4  NEUTROABS 5.8  --  3.8 4.0  HGB 11.5* 11.1* 9.3* 9.8*  HCT 35.0* 34.7* 28.9* 29.5*  MCV 90.4 93.5 90.6 89.9  PLT 272 236 211 175   Basic Metabolic Panel: Recent Labs  Lab 11/23/20 0821 11/23/20 0842 11/24/20 0545 11/26/20 0330 11/27/20 0913 11/28/20 0521 11/29/20 0335  NA  --    < > 137 136 133* 136 135  K  --    < > 3.9 4.3 4.3 4.2 4.2  CL  --    < > 105 102 99 99 99  CO2  --    < > 23 25 25 28 27   GLUCOSE  --    < > 150* 192* 207* 187* 216*  BUN  --    < > 11 14 13 13 16    CREATININE  --    < > 1.20* 1.20* 1.16* 1.21* 1.29*  CALCIUM  --    < > 7.8* 8.1* 8.4* 8.6* 8.3*  MG  --    < > 1.8 2.0 1.8 1.7 1.8  PHOS 3.4  --   --   --   --   --   --    < > = values in this interval not displayed.   GFR: Estimated Creatinine Clearance: 32.2 mL/min (A) (by C-G formula based on SCr of 1.29 mg/dL (H)). Liver Function Tests: Recent Labs  Lab 11/22/20 1738  AST 32  ALT 17  ALKPHOS 80  BILITOT 0.5  PROT 6.4*  ALBUMIN 3.2*   Recent Labs  Lab 11/22/20 1738  LIPASE 21   No results for input(s): AMMONIA in the last 168 hours. Coagulation Profile: Recent Labs  Lab 11/22/20 1738  INR 1.2   Cardiac Enzymes: No results for input(s): CKTOTAL, CKMB, CKMBINDEX, TROPONINI in the last 168 hours. BNP (last 3 results) No results for input(s): PROBNP in the last 8760 hours. HbA1C: No results for input(s): HGBA1C in the last 72 hours. CBG: Recent Labs  Lab 11/27/20 2117 11/28/20 0906 11/28/20 1144 11/28/20 1739 11/28/20 2027  GLUCAP 238* 190* 307* 194* 274*   Lipid Profile: No results for input(s): CHOL, HDL, LDLCALC, TRIG, CHOLHDL, LDLDIRECT in the last 72 hours. Thyroid Function Tests: No results for input(s): TSH, T4TOTAL, FREET4, T3FREE, THYROIDAB in the last 72 hours. Anemia Panel: No results for input(s): VITAMINB12, FOLATE, FERRITIN, TIBC, IRON, RETICCTPCT in the last 72 hours. Sepsis Labs: Recent Labs  Lab 11/22/20 1736 11/23/20 0842  LATICACIDVEN 2.4* 2.4*    Recent Results (from the past 240 hour(s))  Blood culture (routine single)     Status: None   Collection Time: 11/22/20  5:36 PM   Specimen: BLOOD  Result Value Ref Range Status   Specimen Description BLOOD BLOOD RIGHT FOREARM  Final   Special Requests   Final    BOTTLES DRAWN AEROBIC AND ANAEROBIC Blood Culture adequate volume   Culture   Final    NO GROWTH 5 DAYS Performed at [facility], [address] [place], [place] [address]    Report Status 11/27/2020 FINAL   Final  Urine culture     Status: Abnormal   Collection Time: 11/22/20  6:40 PM   Specimen: In/Out Cath Urine  Result Value Ref Range Status   Specimen Description   Final    IN/OUT CATH  URINE Performed at [facility], [address] [place], [place] [address]    Special Requests   Final    NONE Performed at [facility], [place], [place] [address]    Culture >=100,000 COLONIES/mL KLEBSIELLA PNEUMONIAE (A)  Final   Report Status 11/25/2020 FINAL  Final   Organism ID, Bacteria KLEBSIELLA PNEUMONIAE (A)  Final      Susceptibility   Klebsiella pneumoniae - MIC*    AMPICILLIN RESISTANT Resistant     CEFAZOLIN <=4 SENSITIVE Sensitive     CEFEPIME <=0.12 SENSITIVE Sensitive     CEFTRIAXONE <=0.25 SENSITIVE Sensitive     CIPROFLOXACIN <=0.25 SENSITIVE Sensitive     GENTAMICIN <=1 SENSITIVE Sensitive     IMIPENEM <=0.25 SENSITIVE Sensitive     NITROFURANTOIN <=16 SENSITIVE Sensitive     TRIMETH/SULFA <=20 SENSITIVE Sensitive     AMPICILLIN/SULBACTAM <=2 SENSITIVE Sensitive     PIP/TAZO <=4 SENSITIVE Sensitive     * >=100,000 COLONIES/mL KLEBSIELLA PNEUMONIAE  C Difficile Quick Screen w PCR reflex     Status: None   Collection Time: 11/22/20  6:42 PM   Specimen: Urine, Clean Catch; Stool  Result Value Ref Range Status   C Diff antigen NEGATIVE NEGATIVE Final   C Diff toxin NEGATIVE NEGATIVE Final   C Diff interpretation No C. difficile detected.  Final    Comment: Performed at [facility], [address] [place], [place] [address]  Gastrointestinal Panel by PCR , Stool     Status: None   Collection Time: 11/22/20  6:43 PM   Specimen: Urine, Clean Catch; Stool  Result Value Ref Range Status   Campylobacter species NOT DETECTED NOT DETECTED Final   Plesimonas shigelloides NOT DETECTED NOT DETECTED Final   Salmonella species NOT DETECTED NOT DETECTED Final   Yersinia enterocolitica NOT DETECTED NOT DETECTED Final   Vibrio species NOT  DETECTED NOT DETECTED Final   Vibrio cholerae NOT DETECTED NOT DETECTED Final   Enteroaggregative E coli (EAEC) NOT DETECTED NOT DETECTED Final   Enteropathogenic E coli (EPEC) NOT DETECTED NOT DETECTED Final   Enterotoxigenic E coli (ETEC) NOT DETECTED NOT DETECTED Final   Shiga like toxin producing E coli (STEC) NOT DETECTED NOT DETECTED Final   Shigella/Enteroinvasive E coli (EIEC) NOT DETECTED NOT DETECTED Final   Cryptosporidium NOT DETECTED NOT DETECTED Final   Cyclospora cayetanensis NOT DETECTED NOT DETECTED Final   Entamoeba histolytica NOT DETECTED NOT DETECTED Final   Giardia lamblia NOT DETECTED NOT DETECTED Final   Adenovirus F40/41 NOT DETECTED NOT DETECTED Final   Astrovirus NOT DETECTED NOT DETECTED Final   Norovirus GI/GII NOT DETECTED NOT DETECTED Final   Rotavirus A NOT DETECTED NOT DETECTED Final   Sapovirus (I, II, IV, and V) NOT DETECTED NOT DETECTED Final    Comment: Performed at [facility], [address] [place], [place] [address]  Resp Panel by RT-PCR (Flu A&B, Covid) Nasopharyngeal Swab     Status: None   Collection Time: 11/22/20  9:45 PM   Specimen: Nasopharyngeal Swab; Nasopharyngeal(NP) swabs in vial transport medium  Result Value Ref Range Status   SARS Coronavirus 2 by RT PCR NEGATIVE NEGATIVE Final    Comment: (NOTE) SARS-CoV-2 target nucleic acids are NOT DETECTED.  The SARS-CoV-2 RNA is generally detectable in upper respiratory specimens during the acute phase of infection. The lowest concentration of SARS-CoV-2 viral copies this assay can detect is 138 copies/mL. A negative result does not preclude SARS-Cov-2 infection  and should not be used as the sole basis for treatment or other patient management decisions. A negative result may occur with  improper specimen collection/handling, submission of specimen other than nasopharyngeal swab, presence of viral mutation(s) within the areas targeted by this assay, and inadequate number of  viral copies(<138 copies/mL). A negative result must be combined with clinical observations, patient history, and epidemiological information. The expected result is Negative.  Fact Sheet for Patients:  EntrepreneurPulse.com.au  Fact Sheet for Healthcare Providers:  IncredibleEmployment.be  This test is no t yet approved or cleared by the [place] FDA and  has been authorized for detection and/or diagnosis of SARS-CoV-2 by FDA under an Emergency Use Authorization (EUA). This EUA will remain  in effect (meaning this test can be used) for the duration of the COVID-19 declaration under Section 564(b)(1) of the Act, 21 U.S.C.section 360bbb-3(b)(1), unless the authorization is terminated  or revoked sooner.       Influenza A by PCR NEGATIVE NEGATIVE Final   Influenza B by PCR NEGATIVE NEGATIVE Final    Comment: (NOTE) The Xpert Xpress SARS-CoV-2/FLU/RSV plus assay is intended as an aid in the diagnosis of influenza from Nasopharyngeal swab specimens and should not be used as a sole basis for treatment. Nasal washings and aspirates are unacceptable for Xpert Xpress SARS-CoV-2/FLU/RSV testing.  Fact Sheet for Patients: EntrepreneurPulse.com.au  Fact Sheet for Healthcare Providers: IncredibleEmployment.be  This test is not yet approved or cleared by the [place] FDA and has been authorized for detection and/or diagnosis of SARS-CoV-2 by FDA under an Emergency Use Authorization (EUA). This EUA will remain in effect (meaning this test can be used) for the duration of the COVID-19 declaration under Section 564(b)(1) of the Act, 21 U.S.C. section 360bbb-3(b)(1), unless the authorization is terminated or revoked.  Performed at [facility], [address], [place], [place] [address]          Radiology Studies: ECHOCARDIOGRAM COMPLETE  Result Date: 11/27/2020    ECHOCARDIOGRAM REPORT    Patient Name:   SKYLEN DANIELSEN Date of Exam: 11/27/2020 Medical Rec #:  [id number]     Height:       59.0 in Accession #:    [id number]    Weight:       216.7 lb Date of Birth:  1934/02/13     BSA:          1.908 m Patient Age:    34 years      BP:           113/48 mmHg Patient Gender: F             HR:           124 bpm. Exam Location:  [facility] Procedure: 2D Echo, Color Doppler and Cardiac Doppler Indications:     I50.31 CHF-Acute Diastolic  History:         Patient has no prior history of Echocardiogram examinations.                  CAD, Pacemaker; Risk Factors:Hypertension, Dyslipidemia and                  Diabetes.  Sonographer:     Charmayne Sheer RDCS (AE) Referring Phys:  [place] Diagnosing Phys: Kathlyn Sacramento MD  Sonographer Comments: Technically difficult study due to poor echo windows. Image acquisition challenging due to patient body habitus and Image acquisition challenging due to mastectomy. IMPRESSIONS  1. Left ventricular ejection fraction,  by estimation, is 55 to 60%. The left ventricle has normal function. Left ventricular endocardial border not optimally defined to evaluate regional wall motion. Left ventricular diastolic parameters are indeterminate.  2. Right ventricular systolic function is normal. The right ventricular size is normal. There is mildly elevated pulmonary artery systolic pressure.  3. Left atrial size was mildly dilated.  4. The mitral valve is normal in structure. No evidence of mitral valve regurgitation. No evidence of mitral stenosis.  5. The aortic valve is normal in structure. Aortic valve regurgitation is trivial. Mild aortic valve sclerosis is present, with no evidence of aortic valve stenosis.  6. The inferior vena cava is normal in size with greater than 50% respiratory variability, suggesting right atrial pressure of 3 mmHg. FINDINGS  Left Ventricle: Left ventricular ejection fraction, by estimation, is 55 to 60%. The left ventricle has normal function. Left  ventricular endocardial border not optimally defined to evaluate regional wall motion. The left ventricular internal cavity size was normal in size. There is no left ventricular hypertrophy. Left ventricular diastolic parameters are indeterminate. Right Ventricle: The right ventricular size is normal. No increase in right ventricular wall thickness. Right ventricular systolic function is normal. There is mildly elevated pulmonary artery systolic pressure. The tricuspid regurgitant velocity is 2.82  m/s, and with an assumed right atrial pressure of 3 mmHg, the estimated right ventricular systolic pressure is 82.9 mmHg. Left Atrium: Left atrial size was mildly dilated. Right Atrium: Right atrial size was normal in size. Pericardium: There is no evidence of pericardial effusion. Mitral Valve: The mitral valve is normal in structure. No evidence of mitral valve regurgitation. No evidence of mitral valve stenosis. MV peak gradient, 6.2 mmHg. The mean mitral valve gradient is 2.0 mmHg. Tricuspid Valve: The tricuspid valve is normal in structure. Tricuspid valve regurgitation is mild . No evidence of tricuspid stenosis. Aortic Valve: The aortic valve is normal in structure. Aortic valve regurgitation is trivial. Mild aortic valve sclerosis is present, with no evidence of aortic valve stenosis. Aortic valve mean gradient measures 3.0 mmHg. Aortic valve peak gradient measures 5.7 mmHg. Aortic valve area, by VTI measures 1.44 cm. Pulmonic Valve: The pulmonic valve was normal in structure. Pulmonic valve regurgitation is not visualized. No evidence of pulmonic stenosis. Aorta: The aortic root is normal in size and structure. Venous: The inferior vena cava is normal in size with greater than 50% respiratory variability, suggesting right atrial pressure of 3 mmHg. IAS/Shunts: No atrial level shunt detected by color flow Doppler. Additional Comments: A device lead is visualized.  LEFT VENTRICLE PLAX 2D LVIDd:         4.70 cm   Diastology LVIDs:         3.30 cm  LV e' medial:    6.96 cm/s LV PW:         1.00 cm  LV E/e' medial:  16.4 LV IVS:        0.90 cm  LV e' lateral:   6.42 cm/s LVOT diam:     1.90 cm  LV E/e' lateral: 17.8 LV SV:         32 LV SV Index:   17 LVOT Area:     2.84 cm  LEFT ATRIUM             Index LA diam:        4.00 cm 2.10 cm/m LA Vol (A2C):   57.4 ml 30.08 ml/m LA Vol (A4C):   58.9 ml 30.86 ml/m LA  Biplane Vol: 60.7 ml 31.81 ml/m  AORTIC VALVE                   PULMONIC VALVE AV Area (Vmax):    1.88 cm    PV Vmax:       1.03 m/s AV Area (Vmean):   1.66 cm    PV Vmean:      66.700 cm/s AV Area (VTI):     1.44 cm    PV VTI:        0.167 m AV Vmax:           119.00 cm/s PV Peak grad:  4.2 mmHg AV Vmean:          82.100 cm/s PV Mean grad:  2.0 mmHg AV VTI:            0.225 m AV Peak Grad:      5.7 mmHg AV Mean Grad:      3.0 mmHg LVOT Vmax:         79.00 cm/s LVOT Vmean:        48.000 cm/s LVOT VTI:          0.114 m LVOT/AV VTI ratio: 0.51  AORTA Ao Root diam: 3.00 cm MITRAL VALVE                TRICUSPID VALVE MV Area (PHT): 4.79 cm     TR Peak grad:   31.8 mmHg MV Area VTI:   1.86 cm     TR Vmax:        282.00 cm/s MV Peak grad:  6.2 mmHg MV Mean grad:  2.0 mmHg     SHUNTS MV Vmax:       1.25 m/s     Systemic VTI:  0.11 m MV Vmean:      62.9 cm/s    Systemic Diam: 1.90 cm MV Decel Time: 158 msec MV E velocity: 114.33 cm/s Kathlyn Sacramento MD Electronically signed by Kathlyn Sacramento MD Signature Date/Time: 11/27/2020/4:39:40 PM    Final         Scheduled Meds: . aspirin EC  81 mg Oral Daily  . insulin aspart  0-5 Units Subcutaneous QHS  . insulin aspart  0-9 Units Subcutaneous TID WC  . insulin glargine  8 Units Subcutaneous QHS  . levothyroxine  75 mcg Oral Daily  . lidocaine  1 patch Transdermal Daily  . lidocaine  1 patch Transdermal Q24H  . lidocaine  1 patch Transdermal Q24H  . pantoprazole  40 mg Oral BID AC  . potassium chloride  40 mEq Oral Once  . ranolazine  500 mg Oral BID  .  rosuvastatin  10 mg Oral Daily  . sodium chloride flush  3 mL Intravenous Q12H  . cyanocobalamin  1,000 mcg Oral Daily   Continuous Infusions: . furosemide (LASIX) 200 mg in dextrose 5% 100 mL (2mg /mL) infusion 4 mg/hr (11/29/20 0344)     LOS: 6 days    Time spent: 30 mins     Wyvonnia Dusky, MD [facility] Pager [phone]  If 7PM-7AM, please contact night-coverage 11/29/2020, 7:20 AM

## 2020-11-29 NOTE — TOC Progression Note (Signed)
Transition of Care [facility]) - Progression Note    Patient Details  Name: Regina [name] MRN: [id number] Date of Birth: May 24, 1934  Transition of [facility]) CM/SW Contact  Shelbie Hutching, RN Phone Number: 11/29/2020, 9:48 AM  Clinical Narrative:    [facility] is unable to offer a bed with out an upfront payment for copay days and will not accept for LTC because patient's medicaid is not already LTC Medicaid.   RNCM spoke with patient's daughter [name] about another facility.  [facility] has offered and is the closest out of the other offers.  RNCM will reach out to [place] [facility] to see if they can accept without upfront payment.     Expected Discharge Plan: [place] Barriers to Discharge: Continued Medical Work up  Expected Discharge Plan and Services Expected Discharge Plan: [place]   Discharge Planning Services: CM Consult Post Acute Care Choice: [place] Living arrangements for the past 2 months: Single Family Home                 DME Arranged: N/A DME Agency: NA       HH Arranged: NA           Social Determinants of Health (SDOH) Interventions    Readmission Risk Interventions No flowsheet data found.

## 2020-11-29 NOTE — TOC Progression Note (Signed)
Transition of Care [facility]) - Progression Note    Patient Details  Name: Regina [name] MRN: [id number] Date of Birth: 10-Feb-1934  [facility]) CM/SW Contact  Shelbie Hutching, RN Phone Number: 11/29/2020, 3:39 PM  Clinical Narrative:    [facility] can accept patient for SNF.  Patient can go tomorrow.  Patient's Medicaid will cover her copays for SNF short term rehab.  RNCM has updated daughter [name] and patient both are in agreement.  MD wants to keep patient until tomorrow due to low BP today.  RNCM starting insurance authorization.  COVID swab ordered.     Expected Discharge Plan: [place] Barriers to Discharge: Continued Medical Work up  Expected Discharge Plan and Services Expected Discharge Plan: [place]   Discharge Planning Services: CM Consult Post Acute Care Choice: [place] Living arrangements for the past 2 months: Single Family Home                 DME Arranged: N/A DME Agency: NA       HH Arranged: NA           Social Determinants of Health (SDOH) Interventions    Readmission Risk Interventions No flowsheet data found.

## 2020-11-30 LAB — GLUCOSE, CAPILLARY
Glucose-Capillary: 164 mg/dL — ABNORMAL HIGH (ref 70–99)
Glucose-Capillary: 220 mg/dL — ABNORMAL HIGH (ref 70–99)
Glucose-Capillary: 276 mg/dL — ABNORMAL HIGH (ref 70–99)

## 2020-11-30 LAB — CBC
HCT: 30.4 % — ABNORMAL LOW (ref 36.0–46.0)
Hemoglobin: 9.8 g/dL — ABNORMAL LOW (ref 12.0–15.0)
MCH: 29.3 pg (ref 26.0–34.0)
MCHC: 32.2 g/dL (ref 30.0–36.0)
MCV: 90.7 fL (ref 80.0–100.0)
Platelets: 217 10*3/uL (ref 150–400)
RBC: 3.35 MIL/uL — ABNORMAL LOW (ref 3.87–5.11)
RDW: 14.2 % (ref 11.5–15.5)
WBC: 7.6 10*3/uL (ref 4.0–10.5)
nRBC: 0 % (ref 0.0–0.2)

## 2020-11-30 LAB — BASIC METABOLIC PANEL
Anion gap: 9 (ref 5–15)
BUN: 22 mg/dL (ref 8–23)
CO2: 29 mmol/L (ref 22–32)
Calcium: 8.3 mg/dL — ABNORMAL LOW (ref 8.9–10.3)
Chloride: 98 mmol/L (ref 98–111)
Creatinine, Ser: 1.48 mg/dL — ABNORMAL HIGH (ref 0.44–1.00)
GFR, Estimated: 34 mL/min — ABNORMAL LOW (ref >60)
Glucose, Bld: 183 mg/dL — ABNORMAL HIGH (ref 70–99)
Potassium: 4.1 mmol/L (ref 3.5–5.1)
Sodium: 136 mmol/L (ref 135–145)

## 2020-11-30 LAB — SARS CORONAVIRUS 2 (TAT 6-24 HRS): SARS Coronavirus 2: NEGATIVE

## 2020-11-30 NOTE — TOC Transition Note (Signed)
Transition of Care [facility]) - CM/SW Discharge Note   Patient Details  Name: Regina [name] MRN: [id number] Date of Birth: 01/03/1934  [facility]) CM/SW Contact:  Pete Pelt, RN Phone Number: 11/30/2020, 11:48 AM   Clinical Narrative:   [facility] care confirms that patient will go to room 15B, report information sent to care RN.  [facility] will arrive at 1630 to transport patient to facility, Daughter Regina [name] advised via telephone of this information.  No further questions from daughter.  TOC contact information given to family.      Barriers to Discharge: Continued Medical Work up   Patient Goals and CMS Choice Patient states their goals for this hospitalization and ongoing recovery are:: Patient agrees to rehab and maybe LTC if needed- wants to go to a good place CMS Medicare.gov Compare Post Acute Care list provided to:: Patient Choice offered to / list presented to : [name]  Discharge Placement                       Discharge Plan and Services   Discharge Planning Services: CM Consult Post Acute Care Choice: [place]          DME Arranged: N/A DME Agency: NA       HH Arranged: NA          Social Determinants of Health (SDOH) Interventions     Readmission Risk Interventions No flowsheet data found.

## 2020-11-30 NOTE — Discharge Summary (Signed)
Physician Discharge Summary  Regina [name] CBJ:[id number] DOB: 1934/08/06 DOA: 11/22/2020  PCP: Donnie Coffin, MD  Admit date: 11/22/2020 Discharge date: 11/30/2020  Admitted From: home  Disposition:  SNF  Recommendations for Outpatient Follow-up:  1. Follow up with PCP in 1-2 weeks 2. F/u nephro, Dr. Candiss Norse, in 1 week   Home Health: no  Equipment/Devices:   Discharge Condition: stable  CODE STATUS: DNR Diet recommendation: Heart Healthy / Carb Modified   Brief/Interim Summary: HPI was taken from Dr. Zada Finders: Regina [name] is a 87 y.o. female with medical history significant for CAD s/p PCI 2009, PAF not on anticoagulation due to history of GI bleed, SSS s/p PPM, CKD stage III, IDT2DM, hypothyroidism, hypertension, hyperlipidemia, and morbid obesity who presents to the ED for evaluation of generalized weakness and loose stools.  Patient was recently admitted from 10/17/2020-10/28/2020 for acute blood loss anemia due to upper GI bleed.  Patient received 2 unit PRBC transfusion.  She underwent EGD on 3/11 which showed an esophageal ulcer with recent bleeding stigmata that was treated with bipolar cautery.  Her home Plavix was stopped and she was continued on aspirin alone at time of discharge.  She was discharged to [facility].  Patient states she was just discharged from SNF to home where she lives alone.  She says she was doing well in the SNF but is having worsening generalized weakness.  She normally ambulates with the use of a walker.  She has also been having loose stools which she says has been ongoing for about 1 month.  She reports a recent urinary incontinence with dysuria.  She is having pain in her back and left arm.  She reports new swelling in both of her lower extremities.  She denies any nausea, vomiting, abdominal pain, chest pain, or dyspnea.  She denies any hematochezia or melena.  ED Course:  Initial vitals showed BP 118/54, pulse 103, RR 30, temp 98.1 F,  SPO2 100% on room air.  Labs show sodium 139, potassium 2.9, bicarb 26, BUN 14, creatinine 1.10, serum glucose 53, albumin 3.2, AST 32, ALT 17, alk phos 80, total bilirubin 0.5, WBC 7.8, hemoglobin 11.5, platelets 272,000, BNP 275.1, lactic acid 2.4, lipase 21, high-sensitivity troponin I 23, TSH 2.618.  Urinalysis shows negative nitrites, trace leukocytes, 0-5 RBCs and WBCs/hpf, many bacteria on microscopy.  Urine and blood cultures were obtained and pending.  C. difficile panel is negative.  GI pathogen panel is obtained and pending.  SARS-CoV-2 panel is ordered and pending.  Portable chest x-ray shows left-sided PPM in place without focal consolidation, edema, or effusion.  Left humerus x-rays unremarkable.  Left upper extremity DVT ultrasound is negative for evidence of DVT.  Patient was given IV D10 with repeat CBG improved to 96.  She was given 500 cc normal saline and IV ceftriaxone.  The hospitalist service was consulted to admit for further evaluation and management.  Hospital course from Dr. Jimmye Norman 4/20-4/21/22: Pt had already completed abx course for UTI. Of note, pt has b/l LE edema, possibly secondary to lymphedema vs chronic venous insufficiency. A trial of IV lasix drip was tried by previous hospitalist w/o much of any change. Pt can f/u w/ nephrology and PCP for further evaluation & possible treatment. PT/OT evaluated the pt and recommended SNF placement. For more information, please see previous progress/consult notes.   Discharge Diagnoses:  Principal Problem:   Generalized weakness Active Problems:   Acute lower UTI   Hypoglycemia  Insulin dependent type 2 diabetes mellitus (HCC)   Hypertension associated with diabetes ([place])   Paroxysmal atrial fibrillation (HCC)   CKD (chronic kidney disease), stage III (HCC)   Hyperlipidemia associated with type 2 diabetes mellitus (HCC)   CAD (coronary artery disease)   Hypothyroidism   Hypotension  Probable UTI/bacteriuria:  urine cx grew Klebsiella pneumoniae. Completed abx course   Chronic diarrhea: etiology unclear, possible IBS. Imodium prn  Hypotension:keep MAP >65. Continue on home dose of BB and continue to hold directrices & ACE-I until cleared by nephro   Hypokalemia: within normal limits  DM2: well controlled w/ HbA1c 5.6. Continue on lantus, SSI w/ accuchecks   PAF: continue on aspirin. Not on anticoagulation secondary to hx of GI bleeding  Sick sinus syndrome: s/p permanent pacemaker.   HLD: continue on statin  CAD:  s/p PCI 2009. Continue on aspirin, statin   Generalized weakness: PT/OT recs SNF   CKDIIIb: Cr is labile. Will continue to monitor   ACD: likely secondary to CKD. No need for a transfusion currently   B/l LE edema: possible lymphedema. D/c IV lasix drip. Monitor I/Os. [place] of b/l LE neg for DVT. Will need outpatient f/u w/ PCP and/or nephro   Hx of esophageal ulcer: continue on PPI. Hx of recent GI bleed   Morbid obesity: BMI 42.2. Complicates overall care and prognosis  Discharge Instructions  Discharge Instructions    Diet - low sodium heart healthy   Complete by: As directed    Diet Carb Modified   Complete by: As directed    Discharge instructions   Complete by: As directed    F/u w/ nephro, Dr. Candiss Norse, in 1 week. F/u w/ PCP in 1-2 weeks   Increase activity slowly   Complete by: As directed      Allergies as of 11/30/2020   No Known Allergies     Medication List    TAKE these medications   acetaminophen 500 MG tablet Commonly known as: TYLENOL Take 500 mg by mouth every 6 (six) hours as needed for mild pain.   aspirin EC 81 MG tablet Take 81 mg by mouth daily.   bumetanide 2 MG tablet Commonly known as: [name] until followup with outpatient doctor due to low blood pressure and acute kidney injury.   cyanocobalamin 1000 MCG tablet Take 1 tablet (1,000 mcg total) by mouth daily.   fluticasone 50 MCG/ACT nasal spray Commonly known  as: FLONASE Place 2 sprays into both nostrils daily as needed for allergies.   insulin aspart 100 UNIT/ML injection Commonly known as: novoLOG Inject 3 Units into the skin 3 (three) times daily with meals.   insulin glargine 100 UNIT/ML injection Commonly known as: LANTUS Inject 0.13 mLs (13 Units total) into the skin at bedtime.   insulin NPH-regular Human (70-30) 100 UNIT/ML injection Inject 42-52 Units into the skin 2 (two) times daily with a meal. Per sliding scale   levothyroxine 75 MCG tablet Commonly known as: SYNTHROID Take 75 mcg by mouth daily.   lidocaine 5 % Commonly known as: LIDODERM Place 3 patches onto the skin daily.   lisinopril 20 MG tablet Commonly known as: ZESTRIL Hold until followup with outpatient doctor due to low blood pressure and acute kidney injury.   metoprolol succinate 50 MG 24 hr tablet Commonly known as: TOPROL-XL Take 1 tablet (50 mg total) by mouth daily. Take with or immediately following a meal.   pantoprazole 40 MG tablet Commonly known as: PROTONIX Take 1  tablet (40 mg total) by mouth 2 (two) times daily before a meal.   paricalcitol 1 MCG capsule Commonly known as: ZEMPLAR Take 1 mcg by mouth every Monday, Wednesday, and Friday.   Percocet 5-325 MG tablet Generic drug: oxyCODONE-acetaminophen Take 1 tablet by mouth every 8 (eight) hours as needed.   polyethylene glycol 17 g packet Commonly known as: MIRALAX / GLYCOLAX Take 17 g by mouth daily.   ranolazine 500 MG 12 hr tablet Commonly known as: RANEXA Take 500 mg by mouth 2 (two) times daily.   rosuvastatin 10 MG tablet Commonly known as: CRESTOR Take 10 mg by mouth daily.   sucralfate 1 GM/10ML suspension Commonly known as: CARAFATE Take 10 mLs (1 g total) by mouth 4 (four) times daily -  with meals and at bedtime for 10 days.       Contact information for follow-up providers    Murlean Iba, MD Follow up in 1 week(s).   Specialty: Nephrology Contact  information: [place] [address] [phone]        Donnie Coffin, MD Follow up in 2 week(s).   Specialty: Family Medicine Contact information: [place] [address] [phone]            Contact information for after-discharge care    [facility] .   Service: Skilled Nursing Contact information: [place] [phone]                 No Known Allergies  Consultations:  nephro    Procedures/Studies: US Venous Img Lower Bilateral (DVT)  Result Date: 11/22/2020 CLINICAL DATA:  Bilateral leg edema. EXAM: BILATERAL LOWER EXTREMITY VENOUS DOPPLER ULTRASOUND TECHNIQUE: Gray-scale sonography with compression, as well as color and duplex ultrasound, were performed to evaluate the deep venous system(s) from the level of the common femoral vein through the popliteal and proximal calf veins. COMPARISON:  None. FINDINGS: VENOUS Normal compressibility of the BILATERAL common femoral, superficial femoral, and popliteal veins, as well as the BILATERAL visualized calf veins. Visualized portions of profunda femoral vein and great saphenous vein unremarkable. No filling defects to suggest DVT on grayscale or color Doppler imaging. Doppler waveforms show normal direction of venous flow, normal respiratory plasticity and response to augmentation. Limited views of the contralateral common femoral vein are unremarkable. OTHER A 5.1 cm x 1.2 cmx 2.1 cm anechoic and hypoechoic structure is seen within the soft tissues of the popliteal fossa on the left. No abnormal flow is noted within this region on color Doppler evaluation. Limitations: none IMPRESSION: 1. No evidence of DVT within the RIGHT or LEFT lower extremity. 2. Left Baker cyst. Electronically Signed   By: Virgina Norfolk M.D.   On: 11/22/2020 22:30   US Venous Img Upper Uni Left  Result  Date: 11/22/2020 CLINICAL DATA:  Left upper extremity pain. EXAM: LEFT UPPER EXTREMITY VENOUS DOPPLER ULTRASOUND TECHNIQUE: Gray-scale sonography with graded compression, as well as color Doppler and duplex ultrasound were performed to evaluate the upper extremity deep venous system from the level of the subclavian vein and including the jugular, axillary, basilic, radial, ulnar and upper cephalic vein. Spectral Doppler was utilized to evaluate flow at rest and with distal augmentation maneuvers. COMPARISON:  None. FINDINGS: Contralateral Subclavian Vein: Respiratory phasicity is normal and symmetric with the symptomatic side. No evidence of thrombus. Normal compressibility. Internal Jugular Vein: No evidence of thrombus. Normal compressibility,  respiratory phasicity and response to augmentation. Subclavian Vein: No evidence of thrombus. Normal compressibility, respiratory phasicity and response to augmentation. Axillary Vein: No evidence of thrombus. Normal compressibility, respiratory phasicity and response to augmentation. Cephalic Vein: No evidence of thrombus. Normal compressibility, respiratory phasicity and response to augmentation. Basilic Vein: No evidence of thrombus. Normal compressibility, respiratory phasicity and response to augmentation. Brachial Veins: No evidence of thrombus. Normal compressibility, respiratory phasicity and response to augmentation. Radial Veins: No evidence of thrombus. Normal compressibility, respiratory phasicity and response to augmentation. Ulnar Veins: No evidence of thrombus. Normal compressibility, respiratory phasicity and response to augmentation. Venous Reflux:  None visualized. Other Findings: It should be noted that the study is limited secondary to edema and the patient's body habitus. IMPRESSION: No evidence of DVT within the LEFT upper extremity. Electronically Signed   By: Virgina Norfolk M.D.   On: 11/22/2020 19:41   DG Chest Port 1 View  Result Date:  11/22/2020 CLINICAL DATA:  Sepsis, diarrhea, weakness for 3 days EXAM: PORTABLE CHEST 1 VIEW COMPARISON:  10/17/2020 FINDINGS: Single frontal view of the chest demonstrates stable dual lead pacer. Cardiac silhouette is unremarkable. No airspace disease, effusion, or pneumothorax. No acute bony abnormalities. IMPRESSION: 1. Stable chest, no acute process. Electronically Signed   By: Randa Ngo M.D.   On: 11/22/2020 18:18   DG Humerus Left  Result Date: 11/22/2020 CLINICAL DATA:  Left humeral pain EXAM: LEFT HUMERUS - 2+ VIEW COMPARISON:  None. FINDINGS: Frontal and lateral views of the left humerus demonstrates no fractures. Alignment of the left elbow and shoulder is anatomic. Mild spurring of the acromion process. Pacer overlies left chest. Visualized portions of the left chest are clear. IMPRESSION: 1. Unremarkable left humerus. Electronically Signed   By: Randa Ngo M.D.   On: 11/22/2020 18:16   ECHOCARDIOGRAM COMPLETE  Result Date: 11/27/2020    ECHOCARDIOGRAM REPORT   Patient Name:   Regina [name] Date of Exam: 11/27/2020 Medical Rec #:  [id number]     Height:       59.0 in Accession #:    [id number]    Weight:       216.7 lb Date of Birth:  08-May-1934     BSA:          1.908 m Patient Age:    24 years      BP:           113/48 mmHg Patient Gender: F             HR:           124 bpm. Exam Location:  [facility] Procedure: 2D Echo, Color Doppler and Cardiac Doppler Indications:     I50.31 CHF-Acute Diastolic  History:         Patient has no prior history of Echocardiogram examinations.                  CAD, Pacemaker; Risk Factors:Hypertension, Dyslipidemia and                  Diabetes.  Sonographer:     Charmayne Sheer RDCS (AE) Referring Phys:  [place] Diagnosing Phys: Kathlyn Sacramento MD  Sonographer Comments: Technically difficult study due to poor echo windows. Image acquisition challenging due to patient body habitus and Image acquisition challenging due to mastectomy. IMPRESSIONS  1. Left  ventricular ejection fraction, by estimation, is 55 to 60%. The left ventricle has normal function. Left ventricular endocardial border not optimally defined to  evaluate regional wall motion. Left ventricular diastolic parameters are indeterminate.  2. Right ventricular systolic function is normal. The right ventricular size is normal. There is mildly elevated pulmonary artery systolic pressure.  3. Left atrial size was mildly dilated.  4. The mitral valve is normal in structure. No evidence of mitral valve regurgitation. No evidence of mitral stenosis.  5. The aortic valve is normal in structure. Aortic valve regurgitation is trivial. Mild aortic valve sclerosis is present, with no evidence of aortic valve stenosis.  6. The inferior vena cava is normal in size with greater than 50% respiratory variability, suggesting right atrial pressure of 3 mmHg. FINDINGS  Left Ventricle: Left ventricular ejection fraction, by estimation, is 55 to 60%. The left ventricle has normal function. Left ventricular endocardial border not optimally defined to evaluate regional wall motion. The left ventricular internal cavity size was normal in size. There is no left ventricular hypertrophy. Left ventricular diastolic parameters are indeterminate. Right Ventricle: The right ventricular size is normal. No increase in right ventricular wall thickness. Right ventricular systolic function is normal. There is mildly elevated pulmonary artery systolic pressure. The tricuspid regurgitant velocity is 2.82  m/s, and with an assumed right atrial pressure of 3 mmHg, the estimated right ventricular systolic pressure is 54.6 mmHg. Left Atrium: Left atrial size was mildly dilated. Right Atrium: Right atrial size was normal in size. Pericardium: There is no evidence of pericardial effusion. Mitral Valve: The mitral valve is normal in structure. No evidence of mitral valve regurgitation. No evidence of mitral valve stenosis. MV peak gradient, 6.2 mmHg.  The mean mitral valve gradient is 2.0 mmHg. Tricuspid Valve: The tricuspid valve is normal in structure. Tricuspid valve regurgitation is mild . No evidence of tricuspid stenosis. Aortic Valve: The aortic valve is normal in structure. Aortic valve regurgitation is trivial. Mild aortic valve sclerosis is present, with no evidence of aortic valve stenosis. Aortic valve mean gradient measures 3.0 mmHg. Aortic valve peak gradient measures 5.7 mmHg. Aortic valve area, by VTI measures 1.44 cm. Pulmonic Valve: The pulmonic valve was normal in structure. Pulmonic valve regurgitation is not visualized. No evidence of pulmonic stenosis. Aorta: The aortic root is normal in size and structure. Venous: The inferior vena cava is normal in size with greater than 50% respiratory variability, suggesting right atrial pressure of 3 mmHg. IAS/Shunts: No atrial level shunt detected by color flow Doppler. Additional Comments: A device lead is visualized.  LEFT VENTRICLE PLAX 2D LVIDd:         4.70 cm  Diastology LVIDs:         3.30 cm  LV e' medial:    6.96 cm/s LV PW:         1.00 cm  LV E/e' medial:  16.4 LV IVS:        0.90 cm  LV e' lateral:   6.42 cm/s LVOT diam:     1.90 cm  LV E/e' lateral: 17.8 LV SV:         32 LV SV Index:   17 LVOT Area:     2.84 cm  LEFT ATRIUM             Index LA diam:        4.00 cm 2.10 cm/m LA Vol (A2C):   57.4 ml 30.08 ml/m LA Vol (A4C):   58.9 ml 30.86 ml/m LA Biplane Vol: 60.7 ml 31.81 ml/m  AORTIC VALVE  PULMONIC VALVE AV Area (Vmax):    1.88 cm    PV Vmax:       1.03 m/s AV Area (Vmean):   1.66 cm    PV Vmean:      66.700 cm/s AV Area (VTI):     1.44 cm    PV VTI:        0.167 m AV Vmax:           119.00 cm/s PV Peak grad:  4.2 mmHg AV Vmean:          82.100 cm/s PV Mean grad:  2.0 mmHg AV VTI:            0.225 m AV Peak Grad:      5.7 mmHg AV Mean Grad:      3.0 mmHg LVOT Vmax:         79.00 cm/s LVOT Vmean:        48.000 cm/s LVOT VTI:          0.114 m LVOT/AV VTI ratio:  0.51  AORTA Ao Root diam: 3.00 cm MITRAL VALVE                TRICUSPID VALVE MV Area (PHT): 4.79 cm     TR Peak grad:   31.8 mmHg MV Area VTI:   1.86 cm     TR Vmax:        282.00 cm/s MV Peak grad:  6.2 mmHg MV Mean grad:  2.0 mmHg     SHUNTS MV Vmax:       1.25 m/s     Systemic VTI:  0.11 m MV Vmean:      62.9 cm/s    Systemic Diam: 1.90 cm MV Decel Time: 158 msec MV E velocity: 114.33 cm/s Kathlyn Sacramento MD Electronically signed by Kathlyn Sacramento MD Signature Date/Time: 11/27/2020/4:39:40 PM    Final    (Echo, Carotid, EGD, Colonoscopy, ERCP)    Subjective:   Discharge Exam: Vitals:   11/30/20 0441 11/30/20 0744  BP: (!) 105/53 113/62  Pulse: 96 (!) 107  Resp: 14 15  Temp: 98.4 F (36.9 C) 98.8 F (37.1 C)  SpO2: 98% 98%   Vitals:   11/29/20 2344 11/30/20 0441 11/30/20 0457 11/30/20 0744  BP: (!) 132/57 (!) 105/53  113/62  Pulse: 98 96  (!) 107  Resp: _0 Temp: 98.3 F (36.8 C) 98.4 F (36.9 C)  98.8 F (37.1 C)  TempSrc: Oral Oral    SpO2: 98% 98%  98%  Weight:   95.4 kg   Height:        General: Pt is alert, awake, not in acute distress Cardiovascular: S1/S2 +, no rubs, no gallops Respiratory: CTA bilaterally, no wheezing, no rhonchi Abdominal: Soft, NT, obese, bowel sounds + Extremities: b/l LE edema, no cyanosis    The results of significant diagnostics from this hospitalization (including imaging, microbiology, ancillary and laboratory) are listed below for reference.     Microbiology: Recent Results (from the past 240 hour(s))  Blood culture (routine single)     Status: None   Collection Time: 11/22/20  5:36 PM   Specimen: BLOOD  Result Value Ref Range Status   Specimen Description BLOOD BLOOD RIGHT FOREARM  Final   Special Requests   Final    BOTTLES DRAWN AEROBIC AND ANAEROBIC Blood Culture adequate volume   Culture   Final    NO GROWTH 5 DAYS Performed at [facility], [address] [place], [place] [address]    Report Status  11/27/2020 FINAL  Final  Urine culture     Status: Abnormal   Collection Time: 11/22/20  6:40 PM   Specimen: In/Out Cath Urine  Result Value Ref Range Status   Specimen Description   Final    IN/OUT CATH URINE Performed at [facility], [address] [place], [place] [address]    Special Requests   Final    NONE Performed at [facility], [address] [place], [place] [address]    Culture >=100,000 COLONIES/mL KLEBSIELLA PNEUMONIAE (A)  Final   Report Status 11/25/2020 FINAL  Final   Organism ID, Bacteria KLEBSIELLA PNEUMONIAE (A)  Final      Susceptibility   Klebsiella pneumoniae - MIC*    AMPICILLIN RESISTANT Resistant     CEFAZOLIN <=4 SENSITIVE Sensitive     CEFEPIME <=0.12 SENSITIVE Sensitive     CEFTRIAXONE <=0.25 SENSITIVE Sensitive     CIPROFLOXACIN <=0.25 SENSITIVE Sensitive     GENTAMICIN <=1 SENSITIVE Sensitive     IMIPENEM <=0.25 SENSITIVE Sensitive     NITROFURANTOIN <=16 SENSITIVE Sensitive     TRIMETH/SULFA <=20 SENSITIVE Sensitive     AMPICILLIN/SULBACTAM <=2 SENSITIVE Sensitive     PIP/TAZO <=4 SENSITIVE Sensitive     * >=100,000 COLONIES/mL KLEBSIELLA PNEUMONIAE  C Difficile Quick Screen w PCR reflex     Status: None   Collection Time: 11/22/20  6:42 PM   Specimen: Urine, Clean Catch; Stool  Result Value Ref Range Status   C Diff antigen NEGATIVE NEGATIVE Final   C Diff toxin NEGATIVE NEGATIVE Final   C Diff interpretation No C. difficile detected.  Final    Comment: Performed at [facility], [address] [place], [place] [address]  Gastrointestinal Panel by PCR , Stool     Status: None   Collection Time: 11/22/20  6:43 PM   Specimen: Urine, Clean Catch; Stool  Result Value Ref Range Status   Campylobacter species NOT DETECTED NOT DETECTED Final   Plesimonas shigelloides NOT DETECTED NOT DETECTED Final   Salmonella species NOT DETECTED NOT DETECTED Final   Yersinia enterocolitica NOT DETECTED NOT DETECTED Final    Vibrio species NOT DETECTED NOT DETECTED Final   Vibrio cholerae NOT DETECTED NOT DETECTED Final   Enteroaggregative E coli (EAEC) NOT DETECTED NOT DETECTED Final   Enteropathogenic E coli (EPEC) NOT DETECTED NOT DETECTED Final   Enterotoxigenic E coli (ETEC) NOT DETECTED NOT DETECTED Final   Shiga like toxin producing E coli (STEC) NOT DETECTED NOT DETECTED Final   Shigella/Enteroinvasive E coli (EIEC) NOT DETECTED NOT DETECTED Final   Cryptosporidium NOT DETECTED NOT DETECTED Final   Cyclospora cayetanensis NOT DETECTED NOT DETECTED Final   Entamoeba histolytica NOT DETECTED NOT DETECTED Final   Giardia lamblia NOT DETECTED NOT DETECTED Final   Adenovirus F40/41 NOT DETECTED NOT DETECTED Final   Astrovirus NOT DETECTED NOT DETECTED Final   Norovirus GI/GII NOT DETECTED NOT DETECTED Final   Rotavirus A NOT DETECTED NOT DETECTED Final   Sapovirus (I, II, IV, and V) NOT DETECTED NOT DETECTED Final    Comment: Performed at [facility], [address] [place], [place] [address]  Resp Panel by RT-PCR (Flu A&B, Covid) Nasopharyngeal Swab     Status: None   Collection Time: 11/22/20  9:45 PM   Specimen: Nasopharyngeal Swab; Nasopharyngeal(NP) swabs in vial transport medium  Result Value Ref Range Status   SARS Coronavirus 2 by RT  PCR NEGATIVE NEGATIVE Final    Comment: (NOTE) SARS-CoV-2 target nucleic acids are NOT DETECTED.  The SARS-CoV-2 RNA is generally detectable in upper respiratory specimens during the acute phase of infection. The lowest concentration of SARS-CoV-2 viral copies this assay can detect is 138 copies/mL. A negative result does not preclude SARS-Cov-2 infection and should not be used as the sole basis for treatment or other patient management decisions. A negative result may occur with  improper specimen collection/handling, submission of specimen other than nasopharyngeal swab, presence of viral mutation(s) within the areas targeted by this assay, and  inadequate number of viral copies(<138 copies/mL). A negative result must be combined with clinical observations, patient history, and epidemiological information. The expected result is Negative.  Fact Sheet for Patients:  EntrepreneurPulse.com.au  Fact Sheet for Healthcare Providers:  IncredibleEmployment.be  This test is no t yet approved or cleared by the [place] FDA and  has been authorized for detection and/or diagnosis of SARS-CoV-2 by FDA under an Emergency Use Authorization (EUA). This EUA will remain  in effect (meaning this test can be used) for the duration of the COVID-19 declaration under Section 564(b)(1) of the Act, 21 U.S.C.section 360bbb-3(b)(1), unless the authorization is terminated  or revoked sooner.       Influenza A by PCR NEGATIVE NEGATIVE Final   Influenza B by PCR NEGATIVE NEGATIVE Final    Comment: (NOTE) The Xpert Xpress SARS-CoV-2/FLU/RSV plus assay is intended as an aid in the diagnosis of influenza from Nasopharyngeal swab specimens and should not be used as a sole basis for treatment. Nasal washings and aspirates are unacceptable for Xpert Xpress SARS-CoV-2/FLU/RSV testing.  Fact Sheet for Patients: EntrepreneurPulse.com.au  Fact Sheet for Healthcare Providers: IncredibleEmployment.be  This test is not yet approved or cleared by the [place] FDA and has been authorized for detection and/or diagnosis of SARS-CoV-2 by FDA under an Emergency Use Authorization (EUA). This EUA will remain in effect (meaning this test can be used) for the duration of the COVID-19 declaration under Section 564(b)(1) of the Act, 21 U.S.C. section 360bbb-3(b)(1), unless the authorization is terminated or revoked.  Performed at [facility], [place], [place] [address]   SARS CORONAVIRUS 2 (TAT 6-24 HRS) Nasopharyngeal Nasopharyngeal Swab     Status: None    Collection Time: 11/29/20  3:00 PM   Specimen: Nasopharyngeal Swab  Result Value Ref Range Status   SARS Coronavirus 2 NEGATIVE NEGATIVE Final    Comment: (NOTE) SARS-CoV-2 target nucleic acids are NOT DETECTED.  The SARS-CoV-2 RNA is generally detectable in upper and lower respiratory specimens during the acute phase of infection. Negative results do not preclude SARS-CoV-2 infection, do not rule out co-infections with other pathogens, and should not be used as the sole basis for treatment or other patient management decisions. Negative results must be combined with clinical observations, patient history, and epidemiological information. The expected result is Negative.  Fact Sheet for Patients: SugarRoll.be  Fact Sheet for Healthcare Providers: https://www.woods-mathews.com/  This test is not yet approved or cleared by the [place] FDA and  has been authorized for detection and/or diagnosis of SARS-CoV-2 by FDA under an Emergency Use Authorization (EUA). This EUA will remain  in effect (meaning this test can be used) for the duration of the COVID-19 declaration under Se ction 564(b)(1) of the Act, 21 U.S.C. section 360bbb-3(b)(1), unless the authorization is terminated or revoked sooner.  Performed at [facility], [place] [address], [place], [place] [address]  Labs: BNP (last 3 results) Recent Labs    11/22/20 1736  BNP 419.3*   Basic Metabolic Panel: Recent Labs  Lab 11/24/20 0545 11/26/20 0330 11/27/20 0913 11/28/20 0521 11/29/20 0335 11/30/20 0433  NA 137 136 133* 136 135 136  K 3.9 4.3 4.3 4.2 4.2 4.1  CL 105 102 99 99 99 98  CO2 _0 GLUCOSE 150* 192* 207* 187* 216* 183*  BUN _1 CREATININE 1.20* 1.20* 1.16* 1.21* 1.29* 1.48*  CALCIUM 7.8* 8.1* 8.4* 8.6* 8.3* 8.3*  MG 1.8 2.0 1.8 1.7 1.8  --    Liver Function Tests: No results for input(s): AST, ALT, ALKPHOS, BILITOT,  PROT, ALBUMIN in the last 168 hours. No results for input(s): LIPASE, AMYLASE in the last 168 hours. No results for input(s): AMMONIA in the last 168 hours. CBC: Recent Labs  Lab 11/24/20 0545 11/26/20 0330 11/29/20 0335 11/30/20 0433  WBC 6.2 6.4 7.5 7.6  NEUTROABS 3.8 4.0  --   --   HGB 9.3* 9.8* 10.0* 9.8*  HCT 28.9* 29.5* 30.9* 30.4*  MCV 90.6 89.9 89.8 90.7  PLT 211 229 238 217   Cardiac Enzymes: No results for input(s): CKTOTAL, CKMB, CKMBINDEX, TROPONINI in the last 168 hours. BNP: Invalid input(s): POCBNP CBG: Recent Labs  Lab 11/29/20 0818 11/29/20 1222 11/29/20 1633 11/29/20 2222 11/30/20 0805  GLUCAP 177* 202* 259* 237* 164*   D-Dimer No results for input(s): DDIMER in the last 72 hours. Hgb A1c No results for input(s): HGBA1C in the last 72 hours. Lipid Profile No results for input(s): CHOL, HDL, LDLCALC, TRIG, CHOLHDL, LDLDIRECT in the last 72 hours. Thyroid function studies No results for input(s): TSH, T4TOTAL, T3FREE, THYROIDAB in the last 72 hours.  Invalid input(s): FREET3 Anemia work up No results for input(s): VITAMINB12, FOLATE, FERRITIN, TIBC, IRON, RETICCTPCT in the last 72 hours. Urinalysis    Component Value Date/Time   COLORURINE AMBER (A) 11/22/2020 1840   APPEARANCEUR CLOUDY (A) 11/22/2020 1840   APPEARANCEUR Cloudy (A) 10/25/2019 1533   LABSPEC 1.009 11/22/2020 1840   LABSPEC 1.008 07/03/2012 1616   PHURINE 5.0 11/22/2020 1840   GLUCOSEU 50 (A) 11/22/2020 1840   GLUCOSEU Negative 07/03/2012 1616   HGBUR SMALL (A) 11/22/2020 1840   BILIRUBINUR NEGATIVE 11/22/2020 1840   BILIRUBINUR Negative 10/25/2019 1533   BILIRUBINUR Negative 07/03/2012 1616   KETONESUR NEGATIVE 11/22/2020 1840   PROTEINUR NEGATIVE 11/22/2020 1840   NITRITE NEGATIVE 11/22/2020 1840   LEUKOCYTESUR TRACE (A) 11/22/2020 1840   LEUKOCYTESUR Trace 07/03/2012 1616   Sepsis Labs Invalid input(s): PROCALCITONIN,  WBC,  LACTICIDVEN Microbiology Recent Results  (from the past 240 hour(s))  Blood culture (routine single)     Status: None   Collection Time: 11/22/20  5:36 PM   Specimen: BLOOD  Result Value Ref Range Status   Specimen Description BLOOD BLOOD RIGHT FOREARM  Final   Special Requests   Final    BOTTLES DRAWN AEROBIC AND ANAEROBIC Blood Culture adequate volume   Culture   Final    NO GROWTH 5 DAYS Performed at [facility], [address] [place], [place] [address]    Report Status 11/27/2020 FINAL  Final  Urine culture     Status: Abnormal   Collection Time: 11/22/20  6:40 PM   Specimen: In/Out Cath Urine  Result Value Ref Range Status   Specimen Description   Final    IN/OUT CATH URINE Performed at [facility], [address] [place], [place] [address]    Special Requests   Final    NONE Performed at [facility], [place], [place] [address]    Culture >=100,000 COLONIES/mL KLEBSIELLA PNEUMONIAE (A)  Final   Report Status 11/25/2020 FINAL  Final   Organism ID, Bacteria KLEBSIELLA PNEUMONIAE (A)  Final      Susceptibility   Klebsiella pneumoniae - MIC*    AMPICILLIN RESISTANT Resistant     CEFAZOLIN <=4 SENSITIVE Sensitive     CEFEPIME <=0.12 SENSITIVE Sensitive     CEFTRIAXONE <=0.25 SENSITIVE Sensitive     CIPROFLOXACIN <=0.25 SENSITIVE Sensitive     GENTAMICIN <=1 SENSITIVE Sensitive     IMIPENEM <=0.25 SENSITIVE Sensitive     NITROFURANTOIN <=16 SENSITIVE Sensitive     TRIMETH/SULFA <=20 SENSITIVE Sensitive     AMPICILLIN/SULBACTAM <=2 SENSITIVE Sensitive     PIP/TAZO <=4 SENSITIVE Sensitive     * >=100,000 COLONIES/mL KLEBSIELLA PNEUMONIAE  C Difficile Quick Screen w PCR reflex     Status: None   Collection Time: 11/22/20  6:42 PM   Specimen: Urine, Clean Catch; Stool  Result Value Ref Range Status   C Diff antigen NEGATIVE NEGATIVE Final   C Diff toxin NEGATIVE NEGATIVE Final   C Diff interpretation No C. difficile detected.  Final    Comment: Performed at  [facility], [address] [place], [place] [address]  Gastrointestinal Panel by PCR , Stool     Status: None   Collection Time: 11/22/20  6:43 PM   Specimen: Urine, Clean Catch; Stool  Result Value Ref Range Status   Campylobacter species NOT DETECTED NOT DETECTED Final   Plesimonas shigelloides NOT DETECTED NOT DETECTED Final   Salmonella species NOT DETECTED NOT DETECTED Final   Yersinia enterocolitica NOT DETECTED NOT DETECTED Final   Vibrio species NOT DETECTED NOT DETECTED Final   Vibrio cholerae NOT DETECTED NOT DETECTED Final   Enteroaggregative E coli (EAEC) NOT DETECTED NOT DETECTED Final   Enteropathogenic E coli (EPEC) NOT DETECTED NOT DETECTED Final   Enterotoxigenic E coli (ETEC) NOT DETECTED NOT DETECTED Final   Shiga like toxin producing E coli (STEC) NOT DETECTED NOT DETECTED Final   Shigella/Enteroinvasive E coli (EIEC) NOT DETECTED NOT DETECTED Final   Cryptosporidium NOT DETECTED NOT DETECTED Final   Cyclospora cayetanensis NOT DETECTED NOT DETECTED Final   Entamoeba histolytica NOT DETECTED NOT DETECTED Final   Giardia lamblia NOT DETECTED NOT DETECTED Final   Adenovirus F40/41 NOT DETECTED NOT DETECTED Final   Astrovirus NOT DETECTED NOT DETECTED Final   Norovirus GI/GII NOT DETECTED NOT DETECTED Final   Rotavirus A NOT DETECTED NOT DETECTED Final   Sapovirus (I, II, IV, and V) NOT DETECTED NOT DETECTED Final    Comment: Performed at [facility], [address] [place], [place] [address]  Resp Panel by RT-PCR (Flu A&B, Covid) Nasopharyngeal Swab     Status: None   Collection Time: 11/22/20  9:45 PM   Specimen: Nasopharyngeal Swab; Nasopharyngeal(NP) swabs in vial transport medium  Result Value Ref Range Status   SARS Coronavirus 2 by RT PCR NEGATIVE NEGATIVE Final    Comment: (NOTE) SARS-CoV-2 target nucleic acids are NOT DETECTED.  The SARS-CoV-2 RNA is generally detectable in upper respiratory specimens during the acute phase of  infection. The lowest concentration of SARS-CoV-2 viral copies this assay can detect is 138 copies/mL. A negative result does not preclude SARS-Cov-2 infection and should not  be used as the sole basis for treatment or other patient management decisions. A negative result may occur with  improper specimen collection/handling, submission of specimen other than nasopharyngeal swab, presence of viral mutation(s) within the areas targeted by this assay, and inadequate number of viral copies(<138 copies/mL). A negative result must be combined with clinical observations, patient history, and epidemiological information. The expected result is Negative.  Fact Sheet for Patients:  EntrepreneurPulse.com.au  Fact Sheet for Healthcare Providers:  IncredibleEmployment.be  This test is no t yet approved or cleared by the [place] FDA and  has been authorized for detection and/or diagnosis of SARS-CoV-2 by FDA under an Emergency Use Authorization (EUA). This EUA will remain  in effect (meaning this test can be used) for the duration of the COVID-19 declaration under Section 564(b)(1) of the Act, 21 U.S.C.section 360bbb-3(b)(1), unless the authorization is terminated  or revoked sooner.       Influenza A by PCR NEGATIVE NEGATIVE Final   Influenza B by PCR NEGATIVE NEGATIVE Final    Comment: (NOTE) The Xpert Xpress SARS-CoV-2/FLU/RSV plus assay is intended as an aid in the diagnosis of influenza from Nasopharyngeal swab specimens and should not be used as a sole basis for treatment. Nasal washings and aspirates are unacceptable for Xpert Xpress SARS-CoV-2/FLU/RSV testing.  Fact Sheet for Patients: EntrepreneurPulse.com.au  Fact Sheet for Healthcare Providers: IncredibleEmployment.be  This test is not yet approved or cleared by the [place] FDA and has been authorized for detection and/or diagnosis of SARS-CoV-2  by FDA under an Emergency Use Authorization (EUA). This EUA will remain in effect (meaning this test can be used) for the duration of the COVID-19 declaration under Section 564(b)(1) of the Act, 21 U.S.C. section 360bbb-3(b)(1), unless the authorization is terminated or revoked.  Performed at [facility], [place], [place] [address]   SARS CORONAVIRUS 2 (TAT 6-24 HRS) Nasopharyngeal Nasopharyngeal Swab     Status: None   Collection Time: 11/29/20  3:00 PM   Specimen: Nasopharyngeal Swab  Result Value Ref Range Status   SARS Coronavirus 2 NEGATIVE NEGATIVE Final    Comment: (NOTE) SARS-CoV-2 target nucleic acids are NOT DETECTED.  The SARS-CoV-2 RNA is generally detectable in upper and lower respiratory specimens during the acute phase of infection. Negative results do not preclude SARS-CoV-2 infection, do not rule out co-infections with other pathogens, and should not be used as the sole basis for treatment or other patient management decisions. Negative results must be combined with clinical observations, patient history, and epidemiological information. The expected result is Negative.  Fact Sheet for Patients: SugarRoll.be  Fact Sheet for Healthcare Providers: https://www.woods-mathews.com/  This test is not yet approved or cleared by the [place] FDA and  has been authorized for detection and/or diagnosis of SARS-CoV-2 by FDA under an Emergency Use Authorization (EUA). This EUA will remain  in effect (meaning this test can be used) for the duration of the COVID-19 declaration under Se ction 564(b)(1) of the Act, 21 U.S.C. section 360bbb-3(b)(1), unless the authorization is terminated or revoked sooner.  Performed at [facility], [place] [address] [place], [place] [address]      Time coordinating discharge: Over 30 minutes  SIGNED:   Wyvonnia Dusky, MD  [facility] 11/30/2020, 10:37 AM Pager   If 7PM-7AM, please contact night-coverage

## 2020-11-30 NOTE — Progress Notes (Signed)
[facility]  ROUNDING NOTE   Subjective:   [name] was admitted to [facility] on 11/22/2020 for AF (paroxysmal atrial fibrillation) ([place]) [I48.0] Generalized weakness [R53.1] Urinary tract infection, acute [N39.0] Bilateral lower extremity edema [R60.0]  Nephrology consulted for acute kidney injury on chronic kidney disease stage IIIB. Was last seen in our office in October 2021.   Patient was treated with furosemide gtt during this admission. Currently without diuretics.   Patient is scheduled for discharge to SNF.   Objective:  Vital signs in last 24 hours:  Temp:  [97.4 F (36.3 C)-98.8 F (37.1 C)] 98.8 F (37.1 C) (04/21 0744) Pulse Rate:  [96-113] 107 (04/21 0744) Resp:  [14-20] 15 (04/21 0744) BP: (102-132)/(45-62) 113/62 (04/21 0744) SpO2:  [98 %] 98 % (04/21 0744) Weight:  [95.4 kg] 95.4 kg (04/21 0457)  Weight change: 0.4 kg Filed Weights   11/28/20 0500 11/29/20 0342 11/30/20 0457  Weight: 95.9 kg 95 kg 95.4 kg    Intake/Output: I/O last 3 completed shifts: In: 949.4 [P.O.:880; I.V.:69.4] Out: 1300 [Urine:1300]   Intake/Output this shift:  Total I/O In: 3 [I.V.:3] Out: -   Physical Exam: General: NAD, laying in bed  Head: Normocephalic, atraumatic. Moist oral mucosal membranes  Eyes: Anicteric, PERRL  Neck: Supple, trachea midline  Lungs:  Clear to auscultation  Heart: Regular rate and rhythm  Abdomen:  Soft, nontender,   Extremities: Trace peripheral edema.  Neurologic: Nonfocal, moving all four extremities  Skin: No lesions        Basic Metabolic Panel: Recent Labs  Lab 11/24/20 0545 11/26/20 0330 11/27/20 0913 11/28/20 0521 11/29/20 0335 11/30/20 0433  NA 137 136 133* 136 135 136  K 3.9 4.3 4.3 4.2 4.2 4.1  CL 105 102 99 99 99 98  CO2 23 25 25 28 27 29   GLUCOSE 150* 192* 207* 187* 216* 183*  BUN 11 14 13 13 16 22   CREATININE 1.20* 1.20* 1.16* 1.21* 1.29* 1.48*  CALCIUM 7.8* 8.1* 8.4* 8.6* 8.3* 8.3*  MG 1.8 2.0  1.8 1.7 1.8  --     Liver Function Tests: No results for input(s): AST, ALT, ALKPHOS, BILITOT, PROT, ALBUMIN in the last 168 hours. No results for input(s): LIPASE, AMYLASE in the last 168 hours. No results for input(s): AMMONIA in the last 168 hours.  CBC: Recent Labs  Lab 11/24/20 0545 11/26/20 0330 11/29/20 0335 11/30/20 0433  WBC 6.2 6.4 7.5 7.6  NEUTROABS 3.8 4.0  --   --   HGB 9.3* 9.8* 10.0* 9.8*  HCT 28.9* 29.5* 30.9* 30.4*  MCV 90.6 89.9 89.8 90.7  PLT 211 229 238 217    Cardiac Enzymes: No results for input(s): CKTOTAL, CKMB, CKMBINDEX, TROPONINI in the last 168 hours.  BNP: Invalid input(s): POCBNP  CBG: Recent Labs  Lab 11/29/20 0818 11/29/20 1222 11/29/20 1633 11/29/20 2222 11/30/20 0805  GLUCAP 177* 202* 259* 237* 164*    Microbiology: Results for orders placed or performed during the hospital encounter of 11/22/20  Blood culture (routine single)     Status: None   Collection Time: 11/22/20  5:36 PM   Specimen: BLOOD  Result Value Ref Range Status   Specimen Description BLOOD BLOOD RIGHT FOREARM  Final   Special Requests   Final    BOTTLES DRAWN AEROBIC AND ANAEROBIC Blood Culture adequate volume   Culture   Final    NO GROWTH 5 DAYS Performed at [facility], [address], [place], [place] [address]  Report Status 11/27/2020 FINAL  Final  Urine culture     Status: Abnormal   Collection Time: 11/22/20  6:40 PM   Specimen: In/Out Cath Urine  Result Value Ref Range Status   Specimen Description   Final    IN/OUT CATH URINE Performed at [facility], [address] [place], [place] [address]    Special Requests   Final    NONE Performed at [facility], [place], [place] [address]    Culture >=100,000 COLONIES/mL KLEBSIELLA PNEUMONIAE (A)  Final   Report Status 11/25/2020 FINAL  Final   Organism ID, Bacteria KLEBSIELLA PNEUMONIAE (A)  Final      Susceptibility   Klebsiella pneumoniae -  MIC*    AMPICILLIN RESISTANT Resistant     CEFAZOLIN <=4 SENSITIVE Sensitive     CEFEPIME <=0.12 SENSITIVE Sensitive     CEFTRIAXONE <=0.25 SENSITIVE Sensitive     CIPROFLOXACIN <=0.25 SENSITIVE Sensitive     GENTAMICIN <=1 SENSITIVE Sensitive     IMIPENEM <=0.25 SENSITIVE Sensitive     NITROFURANTOIN <=16 SENSITIVE Sensitive     TRIMETH/SULFA <=20 SENSITIVE Sensitive     AMPICILLIN/SULBACTAM <=2 SENSITIVE Sensitive     PIP/TAZO <=4 SENSITIVE Sensitive     * >=100,000 COLONIES/mL KLEBSIELLA PNEUMONIAE  C Difficile Quick Screen w PCR reflex     Status: None   Collection Time: 11/22/20  6:42 PM   Specimen: Urine, Clean Catch; Stool  Result Value Ref Range Status   C Diff antigen NEGATIVE NEGATIVE Final   C Diff toxin NEGATIVE NEGATIVE Final   C Diff interpretation No C. difficile detected.  Final    Comment: Performed at [facility], [address] [place], [place] [address]  Gastrointestinal Panel by PCR , Stool     Status: None   Collection Time: 11/22/20  6:43 PM   Specimen: Urine, Clean Catch; Stool  Result Value Ref Range Status   Campylobacter species NOT DETECTED NOT DETECTED Final   Plesimonas shigelloides NOT DETECTED NOT DETECTED Final   Salmonella species NOT DETECTED NOT DETECTED Final   Yersinia enterocolitica NOT DETECTED NOT DETECTED Final   Vibrio species NOT DETECTED NOT DETECTED Final   Vibrio cholerae NOT DETECTED NOT DETECTED Final   Enteroaggregative E coli (EAEC) NOT DETECTED NOT DETECTED Final   Enteropathogenic E coli (EPEC) NOT DETECTED NOT DETECTED Final   Enterotoxigenic E coli (ETEC) NOT DETECTED NOT DETECTED Final   Shiga like toxin producing E coli (STEC) NOT DETECTED NOT DETECTED Final   Shigella/Enteroinvasive E coli (EIEC) NOT DETECTED NOT DETECTED Final   Cryptosporidium NOT DETECTED NOT DETECTED Final   Cyclospora cayetanensis NOT DETECTED NOT DETECTED Final   Entamoeba histolytica NOT DETECTED NOT DETECTED Final   Giardia lamblia  NOT DETECTED NOT DETECTED Final   Adenovirus F40/41 NOT DETECTED NOT DETECTED Final   Astrovirus NOT DETECTED NOT DETECTED Final   Norovirus GI/GII NOT DETECTED NOT DETECTED Final   Rotavirus A NOT DETECTED NOT DETECTED Final   Sapovirus (I, II, IV, and V) NOT DETECTED NOT DETECTED Final    Comment: Performed at [facility], [address] [place], [place] [address]  Resp Panel by RT-PCR (Flu A&B, Covid) Nasopharyngeal Swab     Status: None   Collection Time: 11/22/20  9:45 PM   Specimen: Nasopharyngeal Swab; Nasopharyngeal(NP) swabs in vial transport medium  Result Value Ref Range Status   SARS Coronavirus 2 by RT PCR NEGATIVE NEGATIVE Final  Comment: (NOTE) SARS-CoV-2 target nucleic acids are NOT DETECTED.  The SARS-CoV-2 RNA is generally detectable in upper respiratory specimens during the acute phase of infection. The lowest concentration of SARS-CoV-2 viral copies this assay can detect is 138 copies/mL. A negative result does not preclude SARS-Cov-2 infection and should not be used as the sole basis for treatment or other patient management decisions. A negative result may occur with  improper specimen collection/handling, submission of specimen other than nasopharyngeal swab, presence of viral mutation(s) within the areas targeted by this assay, and inadequate number of viral copies(<138 copies/mL). A negative result must be combined with clinical observations, patient history, and epidemiological information. The expected result is Negative.  Fact Sheet for Patients:  EntrepreneurPulse.com.au  Fact Sheet for Healthcare Providers:  IncredibleEmployment.be  This test is no t yet approved or cleared by the [place] FDA and  has been authorized for detection and/or diagnosis of SARS-CoV-2 by FDA under an Emergency Use Authorization (EUA). This EUA will remain  in effect (meaning this test can be used) for the duration of  the COVID-19 declaration under Section 564(b)(1) of the Act, 21 U.S.C.section 360bbb-3(b)(1), unless the authorization is terminated  or revoked sooner.       Influenza A by PCR NEGATIVE NEGATIVE Final   Influenza B by PCR NEGATIVE NEGATIVE Final    Comment: (NOTE) The Xpert Xpress SARS-CoV-2/FLU/RSV plus assay is intended as an aid in the diagnosis of influenza from Nasopharyngeal swab specimens and should not be used as a sole basis for treatment. Nasal washings and aspirates are unacceptable for Xpert Xpress SARS-CoV-2/FLU/RSV testing.  Fact Sheet for Patients: EntrepreneurPulse.com.au  Fact Sheet for Healthcare Providers: IncredibleEmployment.be  This test is not yet approved or cleared by the [place] FDA and has been authorized for detection and/or diagnosis of SARS-CoV-2 by FDA under an Emergency Use Authorization (EUA). This EUA will remain in effect (meaning this test can be used) for the duration of the COVID-19 declaration under Section 564(b)(1) of the Act, 21 U.S.C. section 360bbb-3(b)(1), unless the authorization is terminated or revoked.  Performed at [facility], [place], [place] [address]   SARS CORONAVIRUS 2 (TAT 6-24 HRS) Nasopharyngeal Nasopharyngeal Swab     Status: None   Collection Time: 11/29/20  3:00 PM   Specimen: Nasopharyngeal Swab  Result Value Ref Range Status   SARS Coronavirus 2 NEGATIVE NEGATIVE Final    Comment: (NOTE) SARS-CoV-2 target nucleic acids are NOT DETECTED.  The SARS-CoV-2 RNA is generally detectable in upper and lower respiratory specimens during the acute phase of infection. Negative results do not preclude SARS-CoV-2 infection, do not rule out co-infections with other pathogens, and should not be used as the sole basis for treatment or other patient management decisions. Negative results must be combined with clinical observations, patient history, and  epidemiological information. The expected result is Negative.  Fact Sheet for Patients: SugarRoll.be  Fact Sheet for Healthcare Providers: https://www.woods-mathews.com/  This test is not yet approved or cleared by the [place] FDA and  has been authorized for detection and/or diagnosis of SARS-CoV-2 by FDA under an Emergency Use Authorization (EUA). This EUA will remain  in effect (meaning this test can be used) for the duration of the COVID-19 declaration under Se ction 564(b)(1) of the Act, 21 U.S.C. section 360bbb-3(b)(1), unless the authorization is terminated or revoked sooner.  Performed at [facility], [place] [address] [place], [place] [address]     Coagulation Studies: No  results for input(s): LABPROT, INR in the last 72 hours.  Urinalysis: No results for input(s): COLORURINE, LABSPEC, PHURINE, GLUCOSEU, HGBUR, BILIRUBINUR, KETONESUR, PROTEINUR, UROBILINOGEN, NITRITE, LEUKOCYTESUR in the last 72 hours.  Invalid input(s): APPERANCEUR    Imaging: No results found.   Medications:    . aspirin EC  81 mg Oral Daily  . docusate sodium  200 mg Oral BID  . insulin aspart  0-5 Units Subcutaneous QHS  . insulin aspart  0-9 Units Subcutaneous TID WC  . insulin glargine  8 Units Subcutaneous QHS  . levothyroxine  75 mcg Oral Daily  . lidocaine  1 patch Transdermal Daily  . lidocaine  1 patch Transdermal Q24H  . lidocaine  1 patch Transdermal Q24H  . pantoprazole  40 mg Oral BID AC  . polyethylene glycol  17 g Oral Daily  . potassium chloride  40 mEq Oral Once  . ranolazine  500 mg Oral BID  . rosuvastatin  10 mg Oral Daily  . sodium chloride flush  3 mL Intravenous Q12H  . cyanocobalamin  1,000 mcg Oral Daily   acetaminophen **OR** acetaminophen, dextrose, loperamide, ondansetron **OR** ondansetron (ZOFRAN) IV  Assessment/ Plan:  [name] is a 87 y.o. white female with diabetes mellitus type II,  hypertension, coronary artery disease, atrial fibrillation, hypothyroidism, hyperlipidemia, history of breast cancer status post left mastectomy, who is admitted to [facility] on 11/22/2020 for AF (paroxysmal atrial fibrillation) ([facility]) [I48.0] Generalized weakness [R53.1] Urinary tract infection, acute [N39.0] Bilateral lower extremity edema [R60.0]  1. Acute kidney injury on chronic kidney disease stage IIIB: creatinine at baseline. Urinalysis obscured by urinary tract infection. History of proteinuria. 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  - holding home lisinopril and bumetanide.   2. Hypokalemia: secondary to loop diuresis - potassium replacement.   3. Hypertension: 106/54. Echocardiogram reviewed. Preserved systolic function. Currently holding all blood pressure agents.   4. Anemia with chronic kidney disease: normocytic. Hemoglobin of 9.8. Consistent with iron deficiency.    5. Diabetes mellitus type II with chronic kidney disease: insulin dependent. Hemoglobin A1c of 5.4%.    LOS: 7 Nagee Goates 4/21/202211:06 AM

## 2020-11-30 NOTE — Progress Notes (Signed)
Patient left at 1700 in stable condition with nonemergent mabulance, RN Arbie Cookey at [place] received report on patient and verbalized understanding of plan of care. Patient also received discharge education and verbalized understanding. Iv removed and purewick removed prior to discharge. Patient left with all personal belongings.

## 2020-11-30 NOTE — Plan of Care (Signed)
Shift Summary: Pt orientedx4, remains on RA, VSS. C/o pain addressed with tylenol and lidocaine patches. Purewick in place at pt request d/t frequent urination, barrier cream applied to groin and buttock d/t incontinence. Small BM this shift. Fall/safety precautions in place, rounding performed, needs/concerns addressed overnight.   Problem: Education: Goal: Knowledge of General Education information will improve Description: Including pain rating scale, medication(s)/side effects and non-pharmacologic comfort measures 11/30/2020 0552 by Jocelyn Lamer, RN Outcome: Progressing 11/30/2020 0433 by Jocelyn Lamer, RN Outcome: Progressing   Problem: Health Behavior/Discharge Planning: Goal: Ability to manage health-related needs will improve 11/30/2020 0552 by Jocelyn Lamer, RN Outcome: Progressing 11/30/2020 0433 by Jocelyn Lamer, RN Outcome: Progressing   Problem: Clinical Measurements: Goal: Ability to maintain clinical measurements within normal limits will improve 11/30/2020 0552 by Jocelyn Lamer, RN Outcome: Progressing 11/30/2020 0433 by Jocelyn Lamer, RN Outcome: Progressing Goal: Will remain free from infection 11/30/2020 0552 by Jocelyn Lamer, RN Outcome: Progressing 11/30/2020 0433 by Jocelyn Lamer, RN Outcome: Progressing Goal: Diagnostic test results will improve 11/30/2020 0552 by Jocelyn Lamer, RN Outcome: Progressing 11/30/2020 0433 by Jocelyn Lamer, RN Outcome: Progressing Goal: Respiratory complications will improve 11/30/2020 0552 by Jocelyn Lamer, RN Outcome: Progressing 11/30/2020 0433 by Jocelyn Lamer, RN Outcome: Progressing Goal: Cardiovascular complication will be avoided 11/30/2020 0552 by Jocelyn Lamer, RN Outcome: Progressing 11/30/2020 0433 by Jocelyn Lamer, RN Outcome: Progressing   Problem: Activity: Goal: Risk for activity intolerance will decrease 11/30/2020 0552 by Jocelyn Lamer, RN Outcome: Progressing 11/30/2020 0433 by  Jocelyn Lamer, RN Outcome: Progressing   Problem: Nutrition: Goal: Adequate nutrition will be maintained 11/30/2020 0552 by Jocelyn Lamer, RN Outcome: Progressing 11/30/2020 0433 by Jocelyn Lamer, RN Outcome: Progressing   Problem: Coping: Goal: Level of anxiety will decrease 11/30/2020 0552 by Jocelyn Lamer, RN Outcome: Progressing 11/30/2020 0433 by Jocelyn Lamer, RN Outcome: Progressing   Problem: Elimination: Goal: Will not experience complications related to bowel motility 11/30/2020 0552 by Jocelyn Lamer, RN Outcome: Progressing 11/30/2020 0433 by Jocelyn Lamer, RN Outcome: Progressing Goal: Will not experience complications related to urinary retention 11/30/2020 0552 by Jocelyn Lamer, RN Outcome: Progressing 11/30/2020 0433 by Jocelyn Lamer, RN Outcome: Progressing   Problem: Pain Managment: Goal: General experience of comfort will improve 11/30/2020 0552 by Jocelyn Lamer, RN Outcome: Progressing 11/30/2020 0433 by Jocelyn Lamer, RN Outcome: Progressing   Problem: Safety: Goal: Ability to remain free from injury will improve 11/30/2020 0552 by Jocelyn Lamer, RN Outcome: Progressing 11/30/2020 0433 by Jocelyn Lamer, RN Outcome: Progressing   Problem: Skin Integrity: Goal: Risk for impaired skin integrity will decrease 11/30/2020 0552 by Jocelyn Lamer, RN Outcome: Progressing 11/30/2020 0433 by Jocelyn Lamer, RN Outcome: Progressing

## 2020-12-04 ENCOUNTER — Other Ambulatory Visit: Payer: Self-pay

## 2020-12-04 ENCOUNTER — Ambulatory Visit: Payer: Medicare Other | Admitting: Gastroenterology

## 2020-12-11 ENCOUNTER — Non-Acute Institutional Stay: Payer: Medicare Other | Admitting: Nurse Practitioner

## 2020-12-11 ENCOUNTER — Encounter: Payer: Self-pay | Admitting: Nurse Practitioner

## 2020-12-11 ENCOUNTER — Other Ambulatory Visit: Payer: Self-pay

## 2020-12-11 VITALS — BP 102/50 | HR 90 | Temp 98.2°F | Resp 18 | Wt 216.3 lb

## 2020-12-11 DIAGNOSIS — R5381 Other malaise: Secondary | ICD-10-CM

## 2020-12-11 DIAGNOSIS — Z515 Encounter for palliative care: Secondary | ICD-10-CM

## 2020-12-11 DIAGNOSIS — E46 Unspecified protein-calorie malnutrition: Secondary | ICD-10-CM

## 2020-12-11 NOTE — Progress Notes (Signed)
Designer, [facility]ellery Palliative Care Consult Note Telephone: [phone]  Fax: [phone]    Date of encounter: 12/11/20 PATIENT NAME: Regina [name] [id number]   [phone] (home)  DOB: 07-26-1934 MRN: [id number] PRIMARY CARE PROVIDER:   Dr [facility] Donnie Coffin, MD,  [place] [address] [phone]  RESPONSIBLE PARTY:    Contact Information    Name Relation Home Work Oliver D Daughter   [phone]     I met face to face with patient and daughter, staff at facility. Palliative Care was asked to follow this patient by consultation request of  Dr Reesa Chew to address advance care planning and complex medical decision making. This is the initial visit  ASSESSMENT AND PLAN / RECOMMENDATIONS:   Advance Care Planning/Goals of Care: Goals include to maximize quality of life and symptom management. Our advance care planning conversation included a discussion about:     The value and importance of advance care planning   Experiences with loved ones who have been seriously ill or have died   Exploration of personal, cultural or spiritual beliefs that might influence medical decisions   Exploration of goals of care in the event of a sudden injury or illness   Identification and preparation of a healthcare agent   Review and updating or creation of an  advance directive document .  Decision not to resuscitate or to de-escalate disease focused treatments due to poor prognosis.  CODE STATUS: DNR  Symptom Management/Plan: 1. ACP: DNR placed in vynca; discussed with daughter, [name] at length who expressed Ms. Barreiro is not longer able to stay for at home and care for herself as her skill level has increased and would be better at ALF or SNF. Will work with d/c planner SW at [facility] with updated information.  2. Debility; continue to work with therapy, encourage self  independence, strengthening, working with ambulating if possible.   3. Protein calorie malnutrition; albumin 3.2; encouraged healthy food choices with obesity, encouraged to eat meals, monitor weights.   4. Palliative care encounter; Palliative care encounter; Palliative medicine team will continue to support patient, patient's family, and medical team. Visit consisted of counseling and education dealing with the complex and emotionally intense issues of symptom management and palliative care in the setting of serious and potentially life-threatening illness  Follow up Palliative Care Visit: Palliative care will continue to follow for complex medical decision making, advance care planning, and clarification of goals. Return 1 weeks or prn.  I spent 85 minutes providing this consultation starting at 1:20pm. More than 50% of the time in this consultation was spent in counseling and care coordination.  PPS: 30%  HOSPICE ELIGIBILITY/DIAGNOSIS: TBD  Chief Complaint: Initial Palliative consult for complex medical decision making  HISTORY OF PRESENT ILLNESS:  Regina [name] is a 87 y.o. year old female  with multiple medical problems including CAD s/p PCI 2009, PAF not on anticoagulation due to history of GI bleed, SSS s/p PPM, CKD stage III, IDT2DM, hypothyroidism, hypertension, hyperlipidemia, and morbid obesity. Hospitalized 10/17/2020-10/28/2020 for acute blood loss anemia due to upper GI bleed.  Patient received 2 unit PRBC transfusion.  She underwent EGD on 3/11 which showed an esophageal ulcer with recent bleeding stigmata that was treated with bipolar cautery.  Her home Plavix was stopped and she was continued on aspirin alone at time of discharge.  She was discharged to [facility].  Hospitalized 11/22/2020 to 11/30/2020 for generalized weakness and loose stools. Hospital course from Dr. Jimmye Norman 4/20-4/21/22: Pt had already completed abx course for UTI. Of note, pt has b/l LE edema, possibly  secondary to lymphedema vs chronic venous insufficiency. A trial of IV lasix drip was tried by previous hospitalist w/o much of any change. Pt can f/u w/ nephrology and PCP for further evaluation & possible treatment. PT/OT evaluated the pt and recommended SNF placement. Ms. Southall was d/c to STR at [facility] were she currently resides. Ms. Cutillo requires assistance from staff for transfers, mobility, bathing, dressing. Staff endorses therapy has been trying to work with Ms. Cammon though just started. Ms. Vanderwall does feed herself. Ms. Roads is a DNR. Ms Furtick per staff verbalizes her needs. At present Ms. Mizner is lying in bed, appears debilitated, obese. No visitors present. I visited and observed Ms. Pieri. We talked about purpose of PC visit. Ms. Fyock in agreement. We talked about how she has been feeling. Ms. Ey endorses she has been upset her carafate for her stomach she has not been getting it at meals, just in am. We talked and reviewed medications, communicated with Oval Linsey NP/Dr Reesa Chew and staff to correct. We talked about when Ms. Snook was at home prior to the initial hospitalization. Ms. Soh endorses she has been able to care for herself, she has an aid comes in helping her with her bath. The aid prepares some meals. We talked about medical goals of care DNR, placed in Vynca. We talked about past medical history, chronic disease progression, concern for increase in skill level, life review, social history, family dynamics. Ms. Tuel has a daughter who helps her. We talked about symptoms, currently Ms. Arington is comfortable. We talked about role PC in POC. We talked about f/u visit in 1 week. Therapeutic listening, emotional support provided. I called [name], Ms. Maxson daughter, clinical update given. We talked about recent events, pmh, chronic disease progression, family dynamics, life review. Current functional living situation of Ms. Billiter residing independently with aid assistance. [name] endorses she can no  longer return home as her skill level has increased and she is unable to be care for as [name] works. [name] endorses she has talked with Ms. Selinger about transitioning to LTC either in ALF or SNF. CIndy endorses Ms. Parveen was unhappy but that is how it will have to be. We talked about medical goals of care. We talked about role PC in poc. We talked about f/u visit. Therapeutic listening, emotional support provided, questions answered, contact information provided. Updated staff  History obtained from review of EMR, discussion with primary team, and interview with family, facility staff/caregiver and/or Ms. Buehner.  I reviewed available labs, medications, imaging, studies and related documents from the EMR.  Records reviewed and summarized above.   ROS Full 14 system review of systems performed and negative with exception of: as per HPI.  Physical Exam: Constitutional: NAD General: obese, chronically ill, appears debilitated EYES: lids intact ENMT:  oral mucous membranes moist CV: S1S2, RRR, + LE edema Pulmonary: LCTA, no increased work of breathing Abdomen:  normo-active BS + 4 quadrants, soft and non tender GU: deferred MSK: moves all extremities,  Skin: warm and dry, no rashes or wounds on visible skin Neuro:  + generalized weakness,  no cognitive impairment Psych: non-anxious affect, A and O x 3 Hem/lymph/immuno: no widespread bruising  CURRENT PROBLEM LIST:  Patient Active Problem List   Diagnosis Date Noted  .  Hypotension 11/23/2020  . Generalized weakness 11/22/2020  . CKD (chronic kidney disease), stage III ([place]) 11/22/2020  . Hyperlipidemia associated with type 2 diabetes mellitus ([place]) 11/22/2020  . CAD (coronary artery disease)   . Hypothyroidism   . Acute upper GI bleeding 10/17/2020  . Pacemaker 12/16/2019  . History of cancer of left breast 12/16/2019  . Sick sinus syndrome due to SA node dysfunction ([place]) 12/16/2019  . Pressure injury of skin 09/26/2016  . C. difficile  colitis 09/25/2016  . Sepsis ([place]) 08/28/2016  . Cellulitis 06/26/2016  . Acute lower UTI 02/20/2015  . Elevated troponin 02/20/2015  . Hypoglycemia 02/20/2015  . Insulin dependent type 2 diabetes mellitus ([place]) 02/20/2015  . Hypertension associated with diabetes ([place]) 02/20/2015  . HLD (hyperlipidemia) 02/20/2015  . Paroxysmal atrial fibrillation ([place]) 02/20/2015  . GERD (gastroesophageal reflux disease) 02/20/2015   PAST MEDICAL HISTORY:  Active Ambulatory Problems    Diagnosis Date Noted  . Acute lower UTI 02/20/2015  . Elevated troponin 02/20/2015  . Hypoglycemia 02/20/2015  . Insulin dependent type 2 diabetes mellitus ([place]) 02/20/2015  . Hypertension associated with diabetes ([place]) 02/20/2015  . HLD (hyperlipidemia) 02/20/2015  . Paroxysmal atrial fibrillation ([place]) 02/20/2015  . GERD (gastroesophageal reflux disease) 02/20/2015  . Cellulitis 06/26/2016  . Sepsis ([place]) 08/28/2016  . C. difficile colitis 09/25/2016  . Pressure injury of skin 09/26/2016  . Pacemaker 12/16/2019  . History of cancer of left breast 12/16/2019  . Sick sinus syndrome due to SA node dysfunction ([place]) 12/16/2019  . Acute upper GI bleeding 10/17/2020  . Generalized weakness 11/22/2020  . CKD (chronic kidney disease), stage III ([place]) 11/22/2020  . Hyperlipidemia associated with type 2 diabetes mellitus ([place]) 11/22/2020  . CAD (coronary artery disease)   . Hypothyroidism   . Hypotension 11/23/2020   Resolved Ambulatory Problems    Diagnosis Date Noted  . No Resolved Ambulatory Problems   Past Medical History:  Diagnosis Date  . Breast cancer, left ([place]) 2013  . Diabetes mellitus without complication ([place])   . Hypertension    SOCIAL HX:  Social History   Tobacco Use  . Smoking status: Never Smoker  . Smokeless tobacco: Never Used  Substance Use Topics  . Alcohol use: No    Alcohol/week: 0.0 standard drinks   FAMILY HX:  Family History  Problem Relation Age of Onset  . Kidney  failure Mother   . Lung cancer Father   . Throat cancer Son     Reviewed  ALLERGIES: No Known Allergies    Questions and concerns were addressed. The patient/family was encouraged to call with questions and/or concerns. My contact information was provided. Provided general support and encouragement, no other unmet needs identified  Thank you for the opportunity to participate in the care of Ms. Schaab.  The palliative care team will continue to follow. Please call our office at [phone] if we can be of additional assistance.   This chart was dictated using voice recognition software. Despite best efforts to proofread, errors can occur which can change the documentation meaning.  Geralyn Figiel Ihor Gully, NP , DNP, MSN, [facility]

## 2020-12-15 ENCOUNTER — Other Ambulatory Visit: Payer: Self-pay

## 2020-12-15 ENCOUNTER — Non-Acute Institutional Stay: Payer: Medicare Other | Admitting: Nurse Practitioner

## 2020-12-15 ENCOUNTER — Encounter: Payer: Self-pay | Admitting: Nurse Practitioner

## 2020-12-15 VITALS — BP 118/71 | HR 82 | Resp 18 | Wt 202.3 lb

## 2020-12-15 DIAGNOSIS — Z515 Encounter for palliative care: Secondary | ICD-10-CM

## 2020-12-15 DIAGNOSIS — R5381 Other malaise: Secondary | ICD-10-CM

## 2020-12-15 DIAGNOSIS — E46 Unspecified protein-calorie malnutrition: Secondary | ICD-10-CM

## 2020-12-15 NOTE — Progress Notes (Signed)
Therapist, nutritional Palliative Care Consult Note Telephone: [phone]  Fax: [phone]    Date of encounter: 12/15/20 PATIENT NAME: Regina [name] [address] [place] [address]   [phone] (home)  DOB: 1934-02-04 MRN: [id number] PRIMARY CARE PROVIDER:   Dr [facility] Emogene Morgan, MD,  [address] [employer] [place] [address] [phone]  RESPONSIBLE PARTY:    Contact Information    Name Relation Home Work Wann D Daughter   [phone]     I met face to face with patient and family in facility. Palliative Care was asked to follow this patient by consultation request of Dr Nelson Chimes to address advance care planning and complex medical decision making. This is a follow up visit  ASSESSMENT AND PLAN / RECOMMENDATIONS:   Symptom Management/Plan: 1. ACP: DNR placed in vynca; NOMNIC given, Medicaid application pending, will be holding at [facility] transitioning at LTC at [facility] per [facility] SW/Cindy daughter. Goals treat what is treatable  2. Debility; continue to work with therapy, encourage self independence, strengthening, working with ambulating if possible.   3. Protein calorie malnutrition; albumin 3.2; encouraged healthy food choices with obesity, encouraged to eat meals, monitor weights.   4. Palliative care encounter; Palliative care encounter; Palliative medicine team will continue to support patient, patient's family, and medical team. Visit consisted of counseling and education dealing with the complex and emotionally intense issues of symptom management and palliative care in the setting of serious and potentially life-threatening illness  Follow up Palliative Care Visit: Palliative care will continue to follow for complex medical decision making, advance care planning, and clarification of goals. Return 1 weeks or prn.  I spent 65 minutes providing this consultation. More than 50% of the time in  this consultation was spent in counseling and care coordination.  PPS: 30%  HOSPICE ELIGIBILITY/DIAGNOSIS: TBD  Chief Complaint: Follow-up for Palliative consult for complex medical decision making  HISTORY OF PRESENT ILLNESS:  Regina [name] is a 87 y.o. year old female  with GI bleed, SSS s/p PPM, CKD stage III,IDT2DM, hypothyroidism, hypertension, hyperlipidemia, and morbid obesity. Hospitalized 10/17/2020-10/28/2020 for acute blood loss anemia due to upper GI bleed. Patient received 2 unit PRBC transfusion. She underwent EGD on 3/11 which showed anesophageal ulcer with recent bleeding stigmata that was treated with bipolar cautery. Her home Plavix was stopped and she was continued on aspirin alone at time of discharge. She was discharged to [facility]. Hospitalized 11/22/2020 to 11/30/2020 for generalized weakness and loose stools. Hospital course from Dr. Mayford Knife 4/20-4/21/22: Pt had already completed abx course for UTI. Of note, pt has b/l LE edema, possibly secondary to lymphedema vs chronic venous insufficiency. A trial of IV lasix drip was tried by previous hospitalist w/o much of any change. Pt can f/u w/ nephrology and PCP for further evaluation & possible treatment. PT/OT evaluated the pt and recommended SNF placement. Ms. Berroa was d/c to STR at [facility] were she currently resides. Ms. Vale requires assistance from staff for transfers, mobility, bathing, dressing. Staff endorses therapy has been trying to work with Ms. Posas though just started. Ms. Vahle does feed herself. Ms. Swenson is a DNR. Ms Ferner per staff verbalizes her needs. Cindy called palliative provider with concerns about medications, facility, requested PC visit today. I visited and observed Regina [name]. Ms. Bunney and I talked about purpose of PC visit. Ms. Armendariz in agreement. Ms. Deboer endorses she is having a  more difficult Pauli. Ms. Machamer and I talked about her medications including patches, insulin. We talked about  symptoms, appetite, debility. We talked about Ms. Hippler concerns about relying on others for needs, loss of independence, residing in facility. Continue to encourage Regina [name] to relay her concerns to facility staff, encourage to continue to advocate for herself, will continue to assist. We talked about challenges residing at [facility]. Medical goals reviewed. Emotional support provided. I called Ms. Randon daughter, Regina [name]. Clinical update given. We talked about PC visit with Regina [name]. We talked about concerns. Empowered Cindy to continue to advocate/communication for Regina [name] needs with the facility. We talked about challenges SNF. We talked about f/u PC visit in 1 week for ongoing monitoring, support as Regina [name] transitions to LTC, medications as she is a high risk for re-hospitalization. Therapeutic listening, emotional support provided. Updated Oval Linsey NP and staff.   History obtained from review of EMR, discussion with primary team, and interview with daughter, Regina [name], facility staff and  Ms. Kleinschmidt.  I reviewed available labs, medications, imaging, studies and related documents from the EMR.  Records reviewed and summarized above.   ROS Full 14 system review of systems performed and negative with exception of: as per HPI.  Physical Exam: Constitutional: NAD General: debilitated, chronically ill, obese female EYES: anicteric sclera, lids intact, no discharge  ENMT: oral mucous membranes moist CV: S1S2, RRR, + LE edema Pulmonary: decrease bases, no increased work of breathing, room air Abdomen:  normo-active BS + 4 quadrants, soft and non tender GU: deferred MSK:  moves all extremities Skin: warm and dry, no rashes or wounds on visible skin Neuro:  + generalized weakness,  no cognitive impairment Psych: non-anxious affect, A and O x 3  Questions and concerns were addressed. The patient/family was encouraged to call with questions and/or concerns. My business card was provided. Provided general support  and encouragement, no other unmet needs identified  Thank you for the opportunity to participate in the care of Ms. Naim.  The palliative care team will continue to follow. Please call our office at [phone] if we can be of additional assistance.   This chart was dictated using voice recognition software. Despite best efforts to proofread, errors can occur which can change the documentation meaning.   Dawana Asper Ihor Gully, NP , MSN, [facility]

## 2020-12-18 ENCOUNTER — Encounter: Payer: Medicare Other | Admitting: Internal Medicine

## 2021-01-12 ENCOUNTER — Non-Acute Institutional Stay: Payer: Medicare Other | Admitting: Nurse Practitioner

## 2021-01-12 ENCOUNTER — Other Ambulatory Visit: Payer: Self-pay

## 2021-01-12 ENCOUNTER — Encounter: Payer: Self-pay | Admitting: Nurse Practitioner

## 2021-01-12 VITALS — BP 131/82 | HR 65 | Temp 98.6°F | Resp 18 | Wt 202.3 lb

## 2021-01-12 DIAGNOSIS — E46 Unspecified protein-calorie malnutrition: Secondary | ICD-10-CM

## 2021-01-12 DIAGNOSIS — Z515 Encounter for palliative care: Secondary | ICD-10-CM

## 2021-01-12 DIAGNOSIS — R5381 Other malaise: Secondary | ICD-10-CM

## 2021-01-12 NOTE — Progress Notes (Signed)
[employer]er, [employer] Palliative Care Consult Note Telephone: [phone]  Fax: [phone]    Date of encounter: 01/12/21 PATIENT NAME: [place] [id number]   [phone] (home)  DOB: 10-09-1933 MRN: [id number] PRIMARY CARE PROVIDER:   Dr Lucianne Lei [facility]  RESPONSIBLE PARTY:    Contact Information    Name Relation Home Work [place] D Daughter   [phone]     I met face to face with patient in facility. Palliative Care was asked to follow this patient by consultation request of Dr Nona Dell to address advance care planning and complex medical decision making. This is a follow up visit.  ASSESSMENT AND PLAN / RECOMMENDATIONS:  Symptom Management/Plan: 1. ACP: DNR placed in vynca; hope to transition to LTC  2. Debility; continue to work with therapy, encourage self independence, strengthening, working with ambulating if possible.   3. Protein calorie malnutrition;  BMI 40.9; current weight 202.3 lbs. Encouraged healthy food choices with obesity, encouraged to eat meals, monitor weights.   4.Palliative care encounter; Palliative care encounter; Palliative medicine team will continue to support patient, patient's family, and medical team. Visit consisted of counseling and education dealing with the complex and emotionally intense issues of symptom management and palliative care in the setting of serious and potentially life-threatening illness  Follow up Palliative Care Visit: Palliative care will continue to follow for complex medical decision making, advance care planning, and clarification of goals. Return 4 weeks or prn.  I spent 35 minutes providing this consultation. More than 50% of the time in this consultation was spent in counseling and care coordination.  PPS: 30%  HOSPICE ELIGIBILITY/DIAGNOSIS: TBD  Chief Complaint: Follow-up palliative consult for complex medical decision  making   HISTORY OF PRESENT ILLNESS:  Charish Schroepfer Yeater is a 87 y.o. year old female  with GI bleed, SSS s/p PPM, CKD stage III,IDT2DM, hypothyroidism, hypertension, hyperlipidemia, and morbid obesity. Hospitalized3/03/2021-10/28/2020 for acute blood loss anemia due to upper GI bleed. Patient received 2 unit PRBC transfusion. She underwent EGD on 3/11 which showed anesophageal ulcer with recent bleeding stigmata that was treated with bipolar cautery. Her home Plavix was stopped and she was continued on aspirin alone at time of discharge. She was discharged to [facility].Hospitalized 11/22/2020 to 11/30/2020 for generalized weakness and loose stools.Hospital course from Dr. Jimmye Norman 4/20-4/21/22: Pt had already completed abx course for UTI. Of note, pt has b/l LE edema, possibly secondary to lymphedema vs chronic venous insufficiency. A trial of IV lasix drip was tried by previous hospitalist w/o much of any change. Pt can f/u w/ nephrology and PCP for further evaluation & possible treatment. PT/OT evaluated the pt and recommended SNF placement.Ms. Fasching was d/c to STR at [facility] were she currently resides. Ms. Calleros requires assistance from staff for transfers, mobility, bathing, dressing. Ms. Fahs does feed herself with fair appetite. Staff endorses no other changes. At present, Ms. Obriant is lying in bed, appears debilitated, chronically ill. No visitors present. I visited and observed Ms. Marschner. We talked about purpose of PC visit, Ms. Creveling in agreement. We talked about how she has been feeling today. Ms. Kapaun endorses she was upset about the speech therapist working with her telling her what to do. Ms. Brittingham endorses she does not like to be told what to do. We talked about eating, safety with eating, foods, nutrition, purpose of speech. We talked about symptoms of pain, shortness of  breath which she denies. We talked about residing at facility. Ms. Mahany stated again she did not like to be told  what to do. We talked about challenges relying on others for care. We talked about coping strategies. Ms. Perkins was cooperative with assessment. Most of PC visit supportive. Medical goals reviewed. Asymptomatic. Will f/u with possible transition to LTC as non-ambulatory not able to return home. Attempted contact daughter for update. Updated staff, no new changes to current goc.   12/31/2019 sodium 137, potassium 4 sodium 137, potassium 4.1, chloride 95, CO2 24, calcium 8.4, BUN 28.3, creatinine 1.22, glucose 181, albumin 3.0, total protein 5.5, alt 14, ast 27.   History obtained from review of EMR, discussion with  Ms. Crandle.  I reviewed available labs, medications, imaging, studies and related documents from the EMR.  Records reviewed and summarized above.   ROS Full 14 system review of systems performed and negative with exception of: as per HPI.  Physical Exam: Constitutional: NAD General: obese, debilitated female EYES:  lids intact ENMT: oral mucous membranes moist CV: S1S2, RRR Pulmonary: LCTA Abdomen: intake 100%, normo-active BS + 4 quadrants, soft and non tender MSK: non-ambulatory Skin: warm and dry, no rashes or wounds on visible skin Neuro:  + generalized weakness,  no cognitive impairment Psych: non-anxious affect, A and O x 3  Questions and concerns were addressed.  Provided general support and encouragement, no other unmet needs identified  Thank you for the opportunity to participate in the care of Ms. Schild.  The palliative care team will continue to follow. Please call our office at [phone] if we can be of additional assistance.   This chart was dictated using voice recognition software. Despite best efforts to proofread, errors can occur which can change the documentation meaning.   Tameika Heckmann Ihor Gully, NP , MSN, [facility]

## 2021-02-14 ENCOUNTER — Other Ambulatory Visit: Payer: Self-pay

## 2021-02-14 ENCOUNTER — Inpatient Hospital Stay
Admission: EM | Admit: 2021-02-14 | Discharge: 2021-02-21 | DRG: 871 | Disposition: A | Discharge status: Hospice | Payer: Medicare Other | Attending: Internal Medicine | Admitting: Internal Medicine

## 2021-02-14 ENCOUNTER — Encounter: Payer: Self-pay | Admitting: Emergency Medicine

## 2021-02-14 ENCOUNTER — Emergency Department: Payer: Medicare Other

## 2021-02-14 DIAGNOSIS — Z7401 Bed confinement status: Secondary | ICD-10-CM

## 2021-02-14 DIAGNOSIS — E1122 Type 2 diabetes mellitus with diabetic chronic kidney disease: Secondary | ICD-10-CM | POA: Diagnosis present

## 2021-02-14 DIAGNOSIS — Z950 Presence of cardiac pacemaker: Secondary | ICD-10-CM | POA: Diagnosis present

## 2021-02-14 DIAGNOSIS — Z841 Family history of disorders of kidney and ureter: Secondary | ICD-10-CM

## 2021-02-14 DIAGNOSIS — Z9119 Patient's noncompliance with other medical treatment and regimen: Secondary | ICD-10-CM

## 2021-02-14 DIAGNOSIS — Z9861 Coronary angioplasty status: Secondary | ICD-10-CM | POA: Diagnosis not present

## 2021-02-14 DIAGNOSIS — N179 Acute kidney failure, unspecified: Secondary | ICD-10-CM | POA: Diagnosis present

## 2021-02-14 DIAGNOSIS — Z515 Encounter for palliative care: Secondary | ICD-10-CM

## 2021-02-14 DIAGNOSIS — Z7902 Long term (current) use of antithrombotics/antiplatelets: Secondary | ICD-10-CM

## 2021-02-14 DIAGNOSIS — N390 Urinary tract infection, site not specified: Secondary | ICD-10-CM | POA: Diagnosis present

## 2021-02-14 DIAGNOSIS — R6520 Severe sepsis without septic shock: Secondary | ICD-10-CM | POA: Diagnosis present

## 2021-02-14 DIAGNOSIS — G9341 Metabolic encephalopathy: Secondary | ICD-10-CM | POA: Diagnosis present

## 2021-02-14 DIAGNOSIS — I4891 Unspecified atrial fibrillation: Secondary | ICD-10-CM | POA: Diagnosis present

## 2021-02-14 DIAGNOSIS — E039 Hypothyroidism, unspecified: Secondary | ICD-10-CM | POA: Diagnosis present

## 2021-02-14 DIAGNOSIS — Z794 Long term (current) use of insulin: Secondary | ICD-10-CM

## 2021-02-14 DIAGNOSIS — E119 Type 2 diabetes mellitus without complications: Secondary | ICD-10-CM | POA: Diagnosis not present

## 2021-02-14 DIAGNOSIS — Z1621 Resistance to vancomycin: Secondary | ICD-10-CM | POA: Diagnosis present

## 2021-02-14 DIAGNOSIS — U071 COVID-19: Secondary | ICD-10-CM | POA: Diagnosis present

## 2021-02-14 DIAGNOSIS — D638 Anemia in other chronic diseases classified elsewhere: Secondary | ICD-10-CM | POA: Diagnosis present

## 2021-02-14 DIAGNOSIS — R410 Disorientation, unspecified: Secondary | ICD-10-CM | POA: Diagnosis not present

## 2021-02-14 DIAGNOSIS — Z801 Family history of malignant neoplasm of trachea, bronchus and lung: Secondary | ICD-10-CM

## 2021-02-14 DIAGNOSIS — Z79890 Hormone replacement therapy (postmenopausal): Secondary | ICD-10-CM

## 2021-02-14 DIAGNOSIS — Z8719 Personal history of other diseases of the digestive system: Secondary | ICD-10-CM | POA: Diagnosis not present

## 2021-02-14 DIAGNOSIS — I25118 Atherosclerotic heart disease of native coronary artery with other forms of angina pectoris: Secondary | ICD-10-CM | POA: Diagnosis not present

## 2021-02-14 DIAGNOSIS — F0390 Unspecified dementia without behavioral disturbance: Secondary | ICD-10-CM | POA: Diagnosis present

## 2021-02-14 DIAGNOSIS — F419 Anxiety disorder, unspecified: Secondary | ICD-10-CM | POA: Diagnosis present

## 2021-02-14 DIAGNOSIS — E860 Dehydration: Secondary | ICD-10-CM | POA: Diagnosis present

## 2021-02-14 DIAGNOSIS — I959 Hypotension, unspecified: Secondary | ICD-10-CM | POA: Diagnosis not present

## 2021-02-14 DIAGNOSIS — I129 Hypertensive chronic kidney disease with stage 1 through stage 4 chronic kidney disease, or unspecified chronic kidney disease: Secondary | ICD-10-CM | POA: Diagnosis present

## 2021-02-14 DIAGNOSIS — S41112A Laceration without foreign body of left upper arm, initial encounter: Secondary | ICD-10-CM | POA: Diagnosis present

## 2021-02-14 DIAGNOSIS — Z1612 Extended spectrum beta lactamase (ESBL) resistance: Secondary | ICD-10-CM | POA: Diagnosis present

## 2021-02-14 DIAGNOSIS — A419 Sepsis, unspecified organism: Secondary | ICD-10-CM | POA: Diagnosis not present

## 2021-02-14 DIAGNOSIS — N189 Chronic kidney disease, unspecified: Secondary | ICD-10-CM | POA: Diagnosis not present

## 2021-02-14 DIAGNOSIS — A4189 Other specified sepsis: Secondary | ICD-10-CM | POA: Diagnosis present

## 2021-02-14 DIAGNOSIS — Z66 Do not resuscitate: Secondary | ICD-10-CM | POA: Diagnosis present

## 2021-02-14 DIAGNOSIS — R296 Repeated falls: Secondary | ICD-10-CM | POA: Diagnosis present

## 2021-02-14 DIAGNOSIS — I495 Sick sinus syndrome: Secondary | ICD-10-CM | POA: Diagnosis present

## 2021-02-14 DIAGNOSIS — I4819 Other persistent atrial fibrillation: Secondary | ICD-10-CM | POA: Diagnosis present

## 2021-02-14 DIAGNOSIS — S41111A Laceration without foreign body of right upper arm, initial encounter: Secondary | ICD-10-CM | POA: Diagnosis present

## 2021-02-14 DIAGNOSIS — K219 Gastro-esophageal reflux disease without esophagitis: Secondary | ICD-10-CM | POA: Diagnosis present

## 2021-02-14 DIAGNOSIS — Z87440 Personal history of urinary (tract) infections: Secondary | ICD-10-CM

## 2021-02-14 DIAGNOSIS — B961 Klebsiella pneumoniae [K. pneumoniae] as the cause of diseases classified elsewhere: Secondary | ICD-10-CM | POA: Diagnosis present

## 2021-02-14 DIAGNOSIS — Z79899 Other long term (current) drug therapy: Secondary | ICD-10-CM

## 2021-02-14 DIAGNOSIS — N1832 Chronic kidney disease, stage 3b: Secondary | ICD-10-CM | POA: Diagnosis present

## 2021-02-14 DIAGNOSIS — B952 Enterococcus as the cause of diseases classified elsewhere: Secondary | ICD-10-CM | POA: Diagnosis present

## 2021-02-14 DIAGNOSIS — X58XXXA Exposure to other specified factors, initial encounter: Secondary | ICD-10-CM | POA: Diagnosis present

## 2021-02-14 DIAGNOSIS — Z808 Family history of malignant neoplasm of other organs or systems: Secondary | ICD-10-CM

## 2021-02-14 DIAGNOSIS — Z853 Personal history of malignant neoplasm of breast: Secondary | ICD-10-CM

## 2021-02-14 DIAGNOSIS — E861 Hypovolemia: Secondary | ICD-10-CM | POA: Diagnosis present

## 2021-02-14 DIAGNOSIS — I2510 Atherosclerotic heart disease of native coronary artery without angina pectoris: Secondary | ICD-10-CM | POA: Diagnosis present

## 2021-02-14 DIAGNOSIS — Z9012 Acquired absence of left breast and nipple: Secondary | ICD-10-CM

## 2021-02-14 DIAGNOSIS — Z7982 Long term (current) use of aspirin: Secondary | ICD-10-CM

## 2021-02-14 DIAGNOSIS — E785 Hyperlipidemia, unspecified: Secondary | ICD-10-CM | POA: Diagnosis present

## 2021-02-14 DIAGNOSIS — Z9181 History of falling: Secondary | ICD-10-CM

## 2021-02-14 DIAGNOSIS — Z7189 Other specified counseling: Secondary | ICD-10-CM | POA: Diagnosis not present

## 2021-02-14 LAB — COMPREHENSIVE METABOLIC PANEL
ALT: 14 U/L (ref 0–44)
AST: 29 U/L (ref 15–41)
Albumin: 2.6 g/dL — ABNORMAL LOW (ref 3.5–5.0)
Alkaline Phosphatase: 94 U/L (ref 38–126)
Anion gap: 10 (ref 5–15)
BUN: 33 mg/dL — ABNORMAL HIGH (ref 8–23)
CO2: 25 mmol/L (ref 22–32)
Calcium: 8.7 mg/dL — ABNORMAL LOW (ref 8.9–10.3)
Chloride: 96 mmol/L — ABNORMAL LOW (ref 98–111)
Creatinine, Ser: 1.71 mg/dL — ABNORMAL HIGH (ref 0.44–1.00)
GFR, Estimated: 29 mL/min — ABNORMAL LOW (ref >60)
Glucose, Bld: 229 mg/dL — ABNORMAL HIGH (ref 70–99)
Potassium: 4.4 mmol/L (ref 3.5–5.1)
Sodium: 131 mmol/L — ABNORMAL LOW (ref 135–145)
Total Bilirubin: 0.7 mg/dL (ref 0.3–1.2)
Total Protein: 6.3 g/dL — ABNORMAL LOW (ref 6.5–8.1)

## 2021-02-14 LAB — TROPONIN I (HIGH SENSITIVITY)
Troponin I (High Sensitivity): 17 ng/L (ref <18)
Troponin I (High Sensitivity): 14 ng/L (ref <18)

## 2021-02-14 LAB — CBC WITH DIFFERENTIAL/PLATELET
Abs Immature Granulocytes: 0.06 10*3/uL (ref 0.00–0.07)
Basophils Absolute: 0.1 10*3/uL (ref 0.0–0.1)
Basophils Relative: 1 %
Eosinophils Absolute: 0.2 10*3/uL (ref 0.0–0.5)
Eosinophils Relative: 2 %
HCT: 34 % — ABNORMAL LOW (ref 36.0–46.0)
Hemoglobin: 11.4 g/dL — ABNORMAL LOW (ref 12.0–15.0)
Immature Granulocytes: 1 %
Lymphocytes Relative: 23 %
Lymphs Abs: 2.2 10*3/uL (ref 0.7–4.0)
MCH: 31 pg (ref 26.0–34.0)
MCHC: 33.5 g/dL (ref 30.0–36.0)
MCV: 92.4 fL (ref 80.0–100.0)
Monocytes Absolute: 0.6 10*3/uL (ref 0.1–1.0)
Monocytes Relative: 6 %
Neutro Abs: 6.6 10*3/uL (ref 1.7–7.7)
Neutrophils Relative %: 67 %
Platelets: 426 10*3/uL — ABNORMAL HIGH (ref 150–400)
RBC: 3.68 MIL/uL — ABNORMAL LOW (ref 3.87–5.11)
RDW: 16.4 % — ABNORMAL HIGH (ref 11.5–15.5)
WBC: 9.8 10*3/uL (ref 4.0–10.5)
nRBC: 0 % (ref 0.0–0.2)

## 2021-02-14 MED ORDER — LACTATED RINGERS IV BOLUS
500.0000 mL | Freq: Once | INTRAVENOUS | Status: AC
  Administered 2021-02-14: 500 mL via INTRAVENOUS

## 2021-02-14 MED ORDER — ACETAMINOPHEN 325 MG PO TABS
650.0000 mg | ORAL_TABLET | ORAL | Status: AC
  Administered 2021-02-14: 650 mg via ORAL
  Filled 2021-02-14: qty 2

## 2021-02-14 MED ORDER — METOPROLOL TARTRATE 5 MG/5ML IV SOLN
5.0000 mg | INTRAVENOUS | Status: DC | PRN
  Administered 2021-02-14: 5 mg via INTRAVENOUS
  Filled 2021-02-14: qty 5

## 2021-02-14 MED ORDER — DILTIAZEM HCL-DEXTROSE 125-5 MG/125ML-% IV SOLN (PREMIX)
5.0000 mg/h | INTRAVENOUS | Status: DC
  Administered 2021-02-14: 5 mg/h via INTRAVENOUS
  Filled 2021-02-14: qty 125

## 2021-02-14 MED ORDER — DILTIAZEM HCL 25 MG/5ML IV SOLN
10.0000 mg | Freq: Once | INTRAVENOUS | Status: DC
  Filled 2021-02-14: qty 5

## 2021-02-14 MED ORDER — DILTIAZEM HCL 25 MG/5ML IV SOLN
5.0000 mg | Freq: Once | INTRAVENOUS | Status: AC
  Administered 2021-02-14: 5 mg via INTRAVENOUS

## 2021-02-14 NOTE — ED Notes (Signed)
Patient transported to CT 

## 2021-02-14 NOTE — ED Notes (Signed)
This RN attempted to check pt BP and pt refused and stated "get the fuck out of my face you nigger." This RN proceeded to explain to pt why I needed to take her BP. Pt then proceeded to say " they are going to find you hanging for 2 hrs." This RN walked out the room and told Lorriane Shire, Therapist, sports and she agreed to switch pt care under Merrily Pew, Therapist, sports.

## 2021-02-14 NOTE — ED Triage Notes (Signed)
Pt to ED via EMS from [facility]. Per EMS, pt started yelling and screaming at staff and wrapped call bell around wrist. Pt arrived yelling and screaming and confused.

## 2021-02-14 NOTE — ED Notes (Signed)
Spoke with Dr. Charna Archer, bp [phone].  He advised to proceed with Diltiazem 5mg  bolus and drip to start at 5mg , also a 539ml bolus of LR.

## 2021-02-14 NOTE — ED Notes (Signed)
Notified MD of blood pressure.

## 2021-02-14 NOTE — ED Provider Notes (Signed)
[facility] Emergency Department Provider Note   ____________________________________________   Event Date/Time   First MD Initiated Contact with Patient 02/14/21 1949     (approximate)  I have reviewed the triage vital signs and the nursing notes.   HISTORY  Chief Complaint Altered Mental Status    HPI Regina [name] is a 87 y.o. female with past medical history of hypertension, hyperlipidemia, diabetes, CAD, paroxysmal atrial fibrillation, sick sinus syndrome status post pacemaker, and CKD who presents to the ED for altered mental status.  History is limited as patient is agitated and aggressive when asked questions, repeatedly states concerns that she is not being cared for at her nursing facility.  She states that she hurts "all over" and is having trouble breathing.  She reportedly became aggressive with staff at her nursing facility, had wrapped the call bell around her wrist.        Past Medical History:  Diagnosis Date   Breast cancer, left ([place]) 2013   Mastectomy.    CAD (coronary artery disease)    s/p cath in 2009 with 2 stents   Diabetes mellitus without complication (HCC)    GERD (gastroesophageal reflux disease)    HLD (hyperlipidemia)    Hypertension    Hypothyroidism    Paroxysmal atrial fibrillation [facility])     Patient Active Problem List   Diagnosis Date Noted   Rapid atrial fibrillation ([place]) 02/14/2021   Acute kidney injury superimposed on CKD lll ([place]) 02/14/2021   History of GI bleed 02/14/2021   Hypotension 11/23/2020   Generalized weakness 11/22/2020   CKD (chronic kidney disease), stage III ([place]) 11/22/2020   Hyperlipidemia associated with type 2 diabetes mellitus ([place]) 11/22/2020   CAD (coronary artery disease)    Hypothyroidism    Acute upper GI bleeding 10/17/2020   Pacemaker 12/16/2019   History of cancer of left breast 12/16/2019   Sick sinus syndrome due to SA node dysfunction ([place]) 12/16/2019   Pressure  injury of skin 09/26/2016   C. difficile colitis 09/25/2016   Sepsis ([place]) 08/28/2016   Cellulitis 06/26/2016   Acute lower UTI 02/20/2015   Elevated troponin 02/20/2015   Hypoglycemia 02/20/2015   Insulin dependent type 2 diabetes mellitus ([place]) 02/20/2015   Hypertension associated with diabetes ([place]) 02/20/2015   HLD (hyperlipidemia) 02/20/2015   Paroxysmal atrial fibrillation ([place]) 02/20/2015   GERD (gastroesophageal reflux disease) 02/20/2015    Past Surgical History:  Procedure Laterality Date   BREAST LUMPECTOMY     CARDIAC CATHETERIZATION     CHOLECYSTECTOMY     ESOPHAGOGASTRODUODENOSCOPY (EGD) WITH PROPOFOL N/A 10/20/2020   Procedure: ESOPHAGOGASTRODUODENOSCOPY (EGD) WITH PROPOFOL;  Surgeon: Lin Landsman, MD;  Location: [facility] ENDOSCOPY;  Service: Gastroenterology;  Laterality: N/A;   MASTECTOMY Left    PACEMAKER INSERTION Left 04/21/2019   Procedure: Pacemaker Change Out;  Surgeon: Cletis Athens, MD;  Location: [facility] ORS;  Service: Cardiovascular;  Laterality: Left;   STENT PLACEMENT VASCULAR ([place] [facility])      Prior to Admission medications   Medication Sig Start Date End Date Taking? Authorizing Provider  acetaminophen (TYLENOL) 500 MG tablet Take 500 mg by mouth every 6 (six) hours as needed for mild pain.    [provider]  aspirin EC 81 MG tablet Take 81 mg by mouth daily.    [provider]  bumetanide (BUMEX) 2 MG tablet Hold until followup with outpatient doctor due to low blood pressure and acute kidney injury. 10/27/20   Enzo Bi, MD  clopidogrel (PLAVIX) 75 MG tablet Take 1 tablet by mouth daily. 11/29/20   [provider]  fluticasone (FLONASE) 50 MCG/ACT nasal spray Place 2 sprays into both nostrils daily as needed for allergies. Patient not taking: No sig reported    [provider]  insulin aspart (NOVOLOG) 100 UNIT/ML injection Inject 3 Units into the skin 3 (three) times daily with meals. Patient not taking: No sig  reported 10/27/20   Enzo Bi, MD  insulin glargine (LANTUS) 100 UNIT/ML injection Inject 0.13 mLs (13 Units total) into the skin at bedtime. Patient not taking: No sig reported 10/27/20   Enzo Bi, MD  insulin NPH-regular Human (70-30) 100 UNIT/ML injection Inject 42-52 Units into the skin 2 (two) times daily with a meal. Per sliding scale    [provider]  levothyroxine (SYNTHROID) 75 MCG tablet Take 75 mcg by mouth daily. 07/22/19   [provider]  lidocaine (LIDODERM) 5 % Place 3 patches onto the skin daily. 10/27/20   Enzo Bi, MD  lisinopril (ZESTRIL) 20 MG tablet Hold until followup with outpatient doctor due to low blood pressure and acute kidney injury. 10/27/20   Enzo Bi, MD  metoprolol succinate (TOPROL-XL) 50 MG 24 hr tablet Take 1 tablet (50 mg total) by mouth daily. Take with or immediately following a meal. 10/27/20   Enzo Bi, MD  pantoprazole (PROTONIX) 40 MG tablet Take 1 tablet (40 mg total) by mouth 2 (two) times daily before a meal. Patient not taking: No sig reported 10/27/20 01/25/21  Enzo Bi, MD  paricalcitol [facility]) 1 MCG capsule Take 1 mcg by mouth every Monday, Wednesday, and Friday.    [provider]  PERCOCET 5-325 MG tablet Take 1 tablet by mouth every 8 (eight) hours as needed. 10/31/20   [provider]  polyethylene glycol (MIRALAX / GLYCOLAX) 17 g packet Take 17 g by mouth daily. Patient not taking: No sig reported 10/28/20   Enzo Bi, MD  ranolazine (RANEXA) 500 MG 12 hr tablet Take 500 mg by mouth 2 (two) times daily.    [provider]  rosuvastatin (CRESTOR) 10 MG tablet Take 10 mg by mouth daily.    [provider]  sucralfate (CARAFATE) 1 GM/10ML suspension Take 10 mLs (1 g total) by mouth 4 (four) times daily -  with meals and at bedtime for 10 days. 10/27/20 11/06/20  Enzo Bi, MD  vitamin B-12 1000 MCG tablet Take 1 tablet (1,000 mcg total) by mouth daily. 10/28/20   Enzo Bi, MD     Allergies Patient has no known allergies.  Family History  Problem Relation Age of Onset   Kidney failure Mother    Lung cancer Father    Throat cancer Son     Social History Social History   Tobacco Use   Smoking status: Never   Smokeless tobacco: Never  Substance Use Topics   Alcohol use: No    Alcohol/week: 0.0 standard drinks   Drug use: No    Review of Systems Unable to obtain secondary to altered mental status and agitation.  ____________________________________________   PHYSICAL EXAM:  VITAL SIGNS: ED Triage Vitals [02/14/21 1952]  Enc Vitals Group     BP      Pulse      Resp      Temp      Temp src      SpO2      Weight      Height      Head  Circumference      Peak Flow      Pain Score 0     Pain Loc      Pain Edu?      Excl. in [place]?     Constitutional: Awake and alert. Eyes: Conjunctivae are normal. Head: Atraumatic. Nose: No congestion/rhinnorhea. Mouth/Throat: Mucous membranes are moist. Neck: Normal ROM Cardiovascular: Tachycardic, irregularly irregular rhythm. Grossly normal heart sounds.  2+ radial pulses bilaterally. Respiratory: Normal respiratory effort.  No retractions. Lungs CTAB. Gastrointestinal: Soft and nontender. No distention. Genitourinary: deferred Musculoskeletal: No lower extremity tenderness nor edema. Neurologic:  Normal speech and language. No gross focal neurologic deficits are appreciated. Skin:  Skin is warm, dry and intact. No rash noted. Psychiatric: Mood and affect are normal. Speech and behavior are normal.  ____________________________________________   LABS (all labs ordered are listed, but only abnormal results are displayed)  Labs Reviewed  CBC WITH DIFFERENTIAL/PLATELET - Abnormal; Notable for the following components:      Result Value   RBC 3.68 (*)    Hemoglobin 11.4 (*)    HCT 34.0 (*)    RDW 16.4 (*)    Platelets 426 (*)    All other components within normal limits  COMPREHENSIVE  METABOLIC PANEL - Abnormal; Notable for the following components:   Sodium 131 (*)    Chloride 96 (*)    Glucose, Bld 229 (*)    BUN 33 (*)    Creatinine, Ser 1.71 (*)    Calcium 8.7 (*)    Total Protein 6.3 (*)    Albumin 2.6 (*)    GFR, Estimated 29 (*)    All other components within normal limits  RESP PANEL BY RT-PCR (FLU A&B, COVID) ARPGX2  URINALYSIS, COMPLETE (UACMP) WITH MICROSCOPIC  HEMOGLOBIN A1C  TROPONIN I (HIGH SENSITIVITY)  TROPONIN I (HIGH SENSITIVITY)   ____________________________________________  EKG  ED ECG REPORT I, Blake Divine, the attending physician, personally viewed and interpreted this ECG.   Date: 02/15/2021  EKG Time: 20:02  Rate: 142  Rhythm: atrial fibrillation  Axis: LAD  Intervals:none  ST&T Change: None    PROCEDURES  Procedure(s) performed (including Critical Care):  .Critical Care  Date/Time: 02/15/2021 12:01 AM Performed by: Blake Divine, MD Authorized by: Blake Divine, MD   Critical care provider statement:    Critical care time (minutes):  45   Critical care time was exclusive of:  Separately billable procedures and treating other patients and teaching time   Critical care was necessary to treat or prevent imminent or life-threatening deterioration of the following conditions:  Cardiac failure   Critical care was time spent personally by me on the following activities:  Discussions with consultants, evaluation of patient's response to treatment, examination of patient, ordering and performing treatments and interventions, ordering and review of laboratory studies, ordering and review of radiographic studies, pulse oximetry, re-evaluation of patient's condition, obtaining history from patient or surrogate and review of old charts   I assumed direction of critical care for this patient from another provider in my specialty: no     ____________________________________________   INITIAL IMPRESSION / ASSESSMENT AND PLAN /  ED COURSE      87 year old female with past medical history of hypertension, hyperlipidemia, diabetes, CAD, paroxysmal atrial fibrillation, sick sinus syndrome, and CKD who presents to the ED due to concern for altered mental status and agitation at her nursing facility.  Patient remains agitated here in the ED and does not participate in evaluation, will occasionally appear  confused but then voices specific request to nursing staff.  She is verbally abusive towards staff, making multiple racist statements.  No focal neurologic deficits noted on exam, CT head performed and negative for acute process.  Chest x-ray reviewed by me with no infiltrate, edema, or effusion.  Patient noted to be tachycardic with a regular rhythm, EKG shows atrial fibrillation with RVR.  There is no improvement in rate following IV dose of metoprolol, patient with borderline blood pressures however with small dose of IV diltiazem along with diltiazem drip both her heart rate and blood pressure seem to be improving.  Labs are unremarkable, case discussed with hospitalist for admission.      ____________________________________________   FINAL CLINICAL IMPRESSION(S) / ED DIAGNOSES  Final diagnoses:  Atrial fibrillation with RVR [facility])     ED Discharge Orders          Ordered    Amb referral to AFIB Clinic        02/15/21 0000             Note:  This document was prepared using Dragon voice recognition software and may include unintentional dictation errors.    Blake Divine, MD 02/15/21 [phone]

## 2021-02-15 ENCOUNTER — Inpatient Hospital Stay: Payer: Self-pay

## 2021-02-15 DIAGNOSIS — Z7189 Other specified counseling: Secondary | ICD-10-CM | POA: Diagnosis not present

## 2021-02-15 DIAGNOSIS — I959 Hypotension, unspecified: Secondary | ICD-10-CM

## 2021-02-15 DIAGNOSIS — A419 Sepsis, unspecified organism: Secondary | ICD-10-CM

## 2021-02-15 DIAGNOSIS — Z515 Encounter for palliative care: Secondary | ICD-10-CM | POA: Diagnosis not present

## 2021-02-15 DIAGNOSIS — Z950 Presence of cardiac pacemaker: Secondary | ICD-10-CM

## 2021-02-15 DIAGNOSIS — I4891 Unspecified atrial fibrillation: Secondary | ICD-10-CM | POA: Diagnosis not present

## 2021-02-15 DIAGNOSIS — U071 COVID-19: Secondary | ICD-10-CM | POA: Diagnosis not present

## 2021-02-15 DIAGNOSIS — N179 Acute kidney failure, unspecified: Secondary | ICD-10-CM

## 2021-02-15 DIAGNOSIS — I25118 Atherosclerotic heart disease of native coronary artery with other forms of angina pectoris: Secondary | ICD-10-CM | POA: Diagnosis not present

## 2021-02-15 DIAGNOSIS — R410 Disorientation, unspecified: Secondary | ICD-10-CM

## 2021-02-15 DIAGNOSIS — E119 Type 2 diabetes mellitus without complications: Secondary | ICD-10-CM | POA: Diagnosis not present

## 2021-02-15 DIAGNOSIS — Z794 Long term (current) use of insulin: Secondary | ICD-10-CM

## 2021-02-15 DIAGNOSIS — N189 Chronic kidney disease, unspecified: Secondary | ICD-10-CM

## 2021-02-15 LAB — CBC
HCT: 27.9 % — ABNORMAL LOW (ref 36.0–46.0)
Hemoglobin: 9.1 g/dL — ABNORMAL LOW (ref 12.0–15.0)
MCH: 30.6 pg (ref 26.0–34.0)
MCHC: 32.6 g/dL (ref 30.0–36.0)
MCV: 93.9 fL (ref 80.0–100.0)
Platelets: 340 10*3/uL (ref 150–400)
RBC: 2.97 MIL/uL — ABNORMAL LOW (ref 3.87–5.11)
RDW: 16.7 % — ABNORMAL HIGH (ref 11.5–15.5)
WBC: 9.4 10*3/uL (ref 4.0–10.5)
nRBC: 0 % (ref 0.0–0.2)

## 2021-02-15 LAB — COMPREHENSIVE METABOLIC PANEL
ALT: 13 U/L (ref 0–44)
AST: 29 U/L (ref 15–41)
Albumin: 2 g/dL — ABNORMAL LOW (ref 3.5–5.0)
Alkaline Phosphatase: 64 U/L (ref 38–126)
Anion gap: 6 (ref 5–15)
BUN: 29 mg/dL — ABNORMAL HIGH (ref 8–23)
CO2: 25 mmol/L (ref 22–32)
Calcium: 7.8 mg/dL — ABNORMAL LOW (ref 8.9–10.3)
Chloride: 106 mmol/L (ref 98–111)
Creatinine, Ser: 1.42 mg/dL — ABNORMAL HIGH (ref 0.44–1.00)
GFR, Estimated: 36 mL/min — ABNORMAL LOW (ref >60)
Glucose, Bld: 85 mg/dL (ref 70–99)
Potassium: 3.4 mmol/L — ABNORMAL LOW (ref 3.5–5.1)
Sodium: 137 mmol/L (ref 135–145)
Total Bilirubin: 0.3 mg/dL (ref 0.3–1.2)
Total Protein: 4.8 g/dL — ABNORMAL LOW (ref 6.5–8.1)

## 2021-02-15 LAB — URINALYSIS, COMPLETE (UACMP) WITH MICROSCOPIC
Bilirubin Urine: NEGATIVE
Glucose, UA: NEGATIVE mg/dL
Hgb urine dipstick: NEGATIVE
Ketones, ur: NEGATIVE mg/dL
Nitrite: NEGATIVE
Protein, ur: NEGATIVE mg/dL
Specific Gravity, Urine: 1.017 (ref 1.005–1.030)
WBC, UA: >50 WBC/hpf — ABNORMAL HIGH (ref 0–5)
pH: 5 (ref 5.0–8.0)

## 2021-02-15 LAB — PROCALCITONIN: Procalcitonin: <0.1 ng/mL

## 2021-02-15 LAB — LACTIC ACID, PLASMA: Lactic Acid, Venous: 2.6 mmol/L (ref 0.5–1.9)

## 2021-02-15 LAB — RESP PANEL BY RT-PCR (FLU A&B, COVID) ARPGX2
Influenza A by PCR: NEGATIVE
Influenza B by PCR: NEGATIVE
SARS Coronavirus 2 by RT PCR: POSITIVE — AB

## 2021-02-15 LAB — HEMOGLOBIN A1C
Hgb A1c MFr Bld: 7.6 % — ABNORMAL HIGH (ref 4.8–5.6)
Mean Plasma Glucose: 171.42 mg/dL

## 2021-02-15 LAB — CBG MONITORING, ED
Glucose-Capillary: 195 mg/dL — ABNORMAL HIGH (ref 70–99)
Glucose-Capillary: 94 mg/dL (ref 70–99)

## 2021-02-15 MED ORDER — MIDODRINE HCL 5 MG PO TABS: 10.0000 mg | ORAL_TABLET | Freq: Three times a day (TID) | ORAL | Status: DC

## 2021-02-15 MED ORDER — SODIUM CHLORIDE 0.9 % IV SOLN
100.0000 mg | Freq: Every day | INTRAVENOUS | Status: DC
  Administered 2021-02-15: 100 mg via INTRAVENOUS
  Filled 2021-02-15: qty 20

## 2021-02-15 MED ORDER — POLYVINYL ALCOHOL 1.4 % OP SOLN
1.0000 [drp] | Freq: Four times a day (QID) | OPHTHALMIC | Status: DC | PRN
  Filled 2021-02-15: qty 15

## 2021-02-15 MED ORDER — HALOPERIDOL LACTATE 5 MG/ML IJ SOLN
0.5000 mg | INTRAMUSCULAR | Status: DC | PRN
  Administered 2021-02-16 – 2021-02-19 (×4): 0.5 mg via INTRAVENOUS
  Filled 2021-02-15 (×5): qty 1

## 2021-02-15 MED ORDER — SODIUM CHLORIDE 0.9 % IV BOLUS
1000.0000 mL | Freq: Once | INTRAVENOUS | Status: AC
  Administered 2021-02-15: 1000 mL via INTRAVENOUS

## 2021-02-15 MED ORDER — AMIODARONE HCL IN DEXTROSE 360-4.14 MG/200ML-% IV SOLN
30.0000 mg/h | INTRAVENOUS | Status: DC
  Administered 2021-02-15: 30 mg/h via INTRAVENOUS

## 2021-02-15 MED ORDER — DILTIAZEM HCL-DEXTROSE 125-5 MG/125ML-% IV SOLN (PREMIX): 5.0000 mg/h | INTRAVENOUS | Status: DC

## 2021-02-15 MED ORDER — HALOPERIDOL 0.5 MG PO TABS
0.5000 mg | ORAL_TABLET | ORAL | Status: DC | PRN
Start: 2021-02-15 — End: 2021-02-21
  Administered 2021-02-20 – 2021-02-21 (×2): 0.5 mg via ORAL
  Filled 2021-02-15 (×4): qty 1

## 2021-02-15 MED ORDER — ACETAMINOPHEN 325 MG PO TABS: 650.0000 mg | ORAL_TABLET | ORAL | Status: DC | PRN

## 2021-02-15 MED ORDER — LORAZEPAM 2 MG/ML IJ SOLN
1.0000 mg | INTRAMUSCULAR | Status: DC | PRN
  Administered 2021-02-15 – 2021-02-20 (×11): 1 mg via INTRAVENOUS
  Filled 2021-02-15 (×12): qty 1

## 2021-02-15 MED ORDER — GLYCOPYRROLATE 0.2 MG/ML IJ SOLN
0.2000 mg | INTRAMUSCULAR | Status: DC | PRN
  Filled 2021-02-15: qty 1

## 2021-02-15 MED ORDER — SODIUM CHLORIDE 0.9 % IV BOLUS
500.0000 mL | Freq: Once | INTRAVENOUS | Status: AC
  Administered 2021-02-15: 500 mL via INTRAVENOUS

## 2021-02-15 MED ORDER — AMIODARONE HCL IN DEXTROSE 360-4.14 MG/200ML-% IV SOLN
60.0000 mg/h | INTRAVENOUS | Status: AC
  Administered 2021-02-15: 60 mg/h via INTRAVENOUS
  Filled 2021-02-15: qty 200

## 2021-02-15 MED ORDER — MORPHINE SULFATE (PF) 2 MG/ML IV SOLN
1.0000 mg | INTRAVENOUS | Status: DC | PRN
  Administered 2021-02-15 (×3): 2 mg via INTRAVENOUS
  Filled 2021-02-15 (×3): qty 1

## 2021-02-15 MED ORDER — INSULIN ASPART 100 UNIT/ML IJ SOLN
0.0000 [IU] | Freq: Three times a day (TID) | INTRAMUSCULAR | Status: DC
  Administered 2021-02-15: 3 [IU] via SUBCUTANEOUS
  Filled 2021-02-15: qty 1

## 2021-02-15 MED ORDER — SODIUM CHLORIDE 0.9 % IV SOLN: Freq: Once | INTRAVENOUS | Status: AC

## 2021-02-15 MED ORDER — ASCORBIC ACID 500 MG PO TABS
500.0000 mg | ORAL_TABLET | Freq: Every day | ORAL | Status: DC
  Administered 2021-02-15: 500 mg via ORAL
  Filled 2021-02-15: qty 1

## 2021-02-15 MED ORDER — ACETAMINOPHEN 325 MG PO TABS: 650.0000 mg | ORAL_TABLET | Freq: Four times a day (QID) | ORAL | Status: DC | PRN

## 2021-02-15 MED ORDER — LORAZEPAM 1 MG PO TABS
1.0000 mg | ORAL_TABLET | ORAL | Status: DC | PRN
  Administered 2021-02-21: 1 mg via ORAL
  Filled 2021-02-15: qty 1

## 2021-02-15 MED ORDER — INSULIN ASPART 100 UNIT/ML IJ SOLN: 0.0000 [IU] | Freq: Every day | INTRAMUSCULAR | Status: DC

## 2021-02-15 MED ORDER — LORAZEPAM 2 MG/ML PO CONC
1.0000 mg | ORAL | Status: DC | PRN
  Administered 2021-02-21: 1 mg via SUBLINGUAL
  Filled 2021-02-15: qty 1
  Filled 2021-02-15: qty 0.5

## 2021-02-15 MED ORDER — ACETAMINOPHEN 650 MG RE SUPP: 650.0000 mg | Freq: Four times a day (QID) | RECTAL | Status: DC | PRN

## 2021-02-15 MED ORDER — BIOTENE DRY MOUTH MT LIQD
15.0000 mL | OROMUCOSAL | Status: DC | PRN
  Filled 2021-02-15: qty 15

## 2021-02-15 MED ORDER — PHENYLEPHRINE HCL-NACL 10-0.9 MG/250ML-% IV SOLN
25.0000 ug/min | INTRAVENOUS | Status: DC
  Filled 2021-02-15: qty 250

## 2021-02-15 MED ORDER — ONDANSETRON HCL 4 MG/2ML IJ SOLN: 4.0000 mg | Freq: Four times a day (QID) | INTRAMUSCULAR | Status: DC | PRN

## 2021-02-15 MED ORDER — ZINC SULFATE 220 (50 ZN) MG PO CAPS
220.0000 mg | ORAL_CAPSULE | Freq: Every day | ORAL | Status: DC
  Administered 2021-02-15: 220 mg via ORAL
  Filled 2021-02-15: qty 1

## 2021-02-15 MED ORDER — SODIUM CHLORIDE 0.9 % IV SOLN: INTRAVENOUS | Status: DC

## 2021-02-15 MED ORDER — HALOPERIDOL LACTATE 2 MG/ML PO CONC
0.5000 mg | ORAL | Status: DC | PRN
  Filled 2021-02-15: qty 0.3

## 2021-02-15 MED ORDER — SODIUM CHLORIDE 0.9 % IV SOLN
1.0000 g | INTRAVENOUS | Status: DC
  Administered 2021-02-15: 1 g via INTRAVENOUS
  Filled 2021-02-15: qty 10

## 2021-02-15 MED ORDER — SODIUM CHLORIDE 0.9 % IV BOLUS
250.0000 mL | Freq: Once | INTRAVENOUS | Status: AC
  Administered 2021-02-15: 250 mL via INTRAVENOUS

## 2021-02-15 MED ORDER — SODIUM CHLORIDE 0.9 % IV SOLN
200.0000 mg | Freq: Once | INTRAVENOUS | Status: AC
  Administered 2021-02-15: 200 mg via INTRAVENOUS
  Filled 2021-02-15: qty 40

## 2021-02-15 MED ORDER — OLANZAPINE 5 MG PO TBDP
5.0000 mg | ORAL_TABLET | Freq: Every day | ORAL | Status: DC | PRN
  Administered 2021-02-15: 5 mg via ORAL
  Filled 2021-02-15: qty 1

## 2021-02-15 MED ORDER — ONDANSETRON 4 MG PO TBDP
4.0000 mg | ORAL_TABLET | Freq: Four times a day (QID) | ORAL | Status: DC | PRN
  Filled 2021-02-15: qty 1

## 2021-02-15 MED ORDER — OLANZAPINE 5 MG PO TBDP
5.0000 mg | ORAL_TABLET | Freq: Three times a day (TID) | ORAL | Status: DC | PRN
  Filled 2021-02-15: qty 1

## 2021-02-15 MED ORDER — GLYCOPYRROLATE 1 MG PO TABS
1.0000 mg | ORAL_TABLET | ORAL | Status: DC | PRN
  Filled 2021-02-15: qty 1

## 2021-02-15 MED ORDER — SODIUM CHLORIDE 0.9 % IV SOLN: 250.0000 mL | INTRAVENOUS | Status: DC

## 2021-02-15 NOTE — ED Notes (Signed)
RN x2 at bedside along with ED tech, pt heard screaming from ED desk. Pt yelling "let me out, get me out, get me the fuck out of here" over and over, attempting to hit ED staff. Unable to be verbally redirected. Attending provider messaged to inform her of current patient status.

## 2021-02-15 NOTE — ED Notes (Signed)
Dr. Duncan to bedside 

## 2021-02-15 NOTE — ED Notes (Signed)
Family at bedside. 

## 2021-02-15 NOTE — ED Notes (Signed)
Pt appears to be resting comfortably, respirations regular and unlabored. Fall precautions maintained.   Lab at bedside to obtain labs.  Will continue to monitor.

## 2021-02-15 NOTE — ED Notes (Signed)
Cardiac monitoring discontinued.

## 2021-02-15 NOTE — ED Notes (Signed)
Messaged attending provider, Dr Antonieta Pert to inform him of current vital signs. Amiodarone drip paused, awaiting further orders. See charted vitals.

## 2021-02-15 NOTE — ED Notes (Signed)
Spoke with Sofie Hartigan at [facility] [phone]) who is calling to inquire about patient status. He states pt was initially sent for altered mental status and psych eval. Discussed poc with him including admission for A Fib. He is available to answer any questions should MD etc need to contact him, he also states he is unsure of baseline mental status of patient.

## 2021-02-15 NOTE — ED Notes (Signed)
Lab will obtain ordered labs due to pt being a hard stick.

## 2021-02-15 NOTE — ED Notes (Signed)
Attending provider, Dr Lupita Leash, present at bedside.

## 2021-02-15 NOTE — ED Notes (Signed)
RN to room, pt resting in bed - appears to have pulled out IV and removed cardiac monitoring wires. Pt is disoriented x4 and cursing at staff. Attempted to reorient patient, unsuccessful. Patient transferred from ER stretcher to hospital stretcher, linens clean and brief dry. Purewick in use. Continuous cardiac, pulse ox, blood pressure monitoring resumed. [place] placed on bilateral hands. Unsuccessful IV attempt x2, once in left AC and once in right AC, gauze applied to both sites. Opal Sidles, RN - charge nurse to room to attempt IV. Stretcher locked in low position with side rails up x2, call light in reach, high fall precautions in place.

## 2021-02-15 NOTE — ED Notes (Signed)
Iv team consult entered as STAT.

## 2021-02-15 NOTE — ED Notes (Signed)
Dr. Damita Dunnings made aware of bp 82/48.

## 2021-02-15 NOTE — ED Notes (Signed)
Messaged attending provider, Dr Antonieta Pert, to inquire about putting an order in for picc line access or mid line access. Anticipate needing better access in the future for adequate patient care.

## 2021-02-15 NOTE — ED Notes (Signed)
IV team RN at bedside for IV placement.

## 2021-02-15 NOTE — H&P (Signed)
History and Physical    Regina [name] DEY:[id number] DOB: [birth year] DOA: 02/14/2021  PCP: System, Provider Not In   Patient coming from: SNF  I have personally briefly reviewed patient's old medical records in [place]  Chief Complaint: Altered mental status  HPI: Regina [name] is a 87 y.o. female with medical history significant for  CAD s/p PCI 2009, PAF not on anticoagulation due to history of GI bleed in March 2022 from esophageal ulcer, SSS s/p PPM, hypothyroidism, CKD stage III, IDT2DM, hypothyroidism, hypertension,  who presents to the ED for evaluation of altered mental status.  Patient was hospitalized 2 months prior for Klebsiella UTI.  On arrival she was alert .  She denied fever or chills or abdominal pain.  Denied cough or chest pain, nausea or vomiting or diarrhea.  Denied shortness of breath ED Course: On arrival, she was afebrile, BP initially 166/86 with pulse 138, respirations 31 with O2 sat 100% on room air.  She was noted to be in rapid A. fib on EKG.  Blood work mostly unremarkable with hemoglobin 11.4, WBC 9800, creatinine 1.71, up from baseline of 1.16. EKG as reviewed by me : A. fib with rate of 142 with no acute ST-T wave changes Imaging: Head CT with no acute findings.  Chest x-ray with no acute disease  Patient was given a dose of IV metoprolol without improvement in heart rate and subsequently administered Cardizem and started on a Cardizem infusion.Her blood pressure felt 94/33 after initiation of the infusion, improving with an IV fluid bolus.  Hospitalist subsequently consulted for admission.  Review of Systems: Limited as patient somewhat uncooperative with questions   Past Medical History:  Diagnosis Date   Breast cancer, left ([place]) 2013   Mastectomy.    CAD (coronary artery disease)    s/p cath in 2009 with 2 stents   Diabetes mellitus without complication (HCC)    GERD (gastroesophageal reflux disease)    HLD (hyperlipidemia)    Hypertension     Hypothyroidism    Paroxysmal atrial fibrillation (HCC)     Past Surgical History:  Procedure Laterality Date   BREAST LUMPECTOMY     CARDIAC CATHETERIZATION     CHOLECYSTECTOMY     ESOPHAGOGASTRODUODENOSCOPY (EGD) WITH PROPOFOL N/A 10/20/2020   Procedure: ESOPHAGOGASTRODUODENOSCOPY (EGD) WITH PROPOFOL;  Surgeon: Lin Landsman, MD;  Location: [facility];  Service: Gastroenterology;  Laterality: N/A;   MASTECTOMY Left    PACEMAKER INSERTION Left 04/21/2019   Procedure: Pacemaker Change Out;  Surgeon: Cletis Athens, MD;  Location: [facility];  Service: Cardiovascular;  Laterality: Left;   STENT PLACEMENT VASCULAR ([place] [facility])       reports that she has never smoked. She has never used smokeless tobacco. She reports that she does not drink alcohol and does not use drugs.  No Known Allergies  Family History  Problem Relation Age of Onset   Kidney failure Mother    Lung cancer Father    Throat cancer Son       Prior to Admission medications   Medication Sig Start Date End Date Taking? Authorizing Provider  acetaminophen (TYLENOL) 500 MG tablet Take 500 mg by mouth every 6 (six) hours as needed for mild pain.    [provider]  aspirin EC 81 MG tablet Take 81 mg by mouth daily.    [provider]  bumetanide (BUMEX) 2 MG tablet Hold until followup with outpatient doctor due to low blood pressure and acute kidney  injury. 10/27/20   Enzo Bi, MD  clopidogrel (PLAVIX) 75 MG tablet Take 1 tablet by mouth daily. 11/29/20   [provider]  fluticasone (FLONASE) 50 MCG/ACT nasal spray Place 2 sprays into both nostrils daily as needed for allergies. Patient not taking: No sig reported    [provider]  insulin aspart (NOVOLOG) 100 UNIT/ML injection Inject 3 Units into the skin 3 (three) times daily with meals. Patient not taking: No sig reported 10/27/20   Enzo Bi, MD  insulin glargine (LANTUS) 100 UNIT/ML injection Inject 0.13 mLs (13 Units  total) into the skin at bedtime. Patient not taking: No sig reported 10/27/20   Enzo Bi, MD  insulin NPH-regular Human (70-30) 100 UNIT/ML injection Inject 42-52 Units into the skin 2 (two) times daily with a meal. Per sliding scale    [provider]  levothyroxine (SYNTHROID) 75 MCG tablet Take 75 mcg by mouth daily. 07/22/19   [provider]  lidocaine (LIDODERM) 5 % Place 3 patches onto the skin daily. 10/27/20   Enzo Bi, MD  lisinopril (ZESTRIL) 20 MG tablet Hold until followup with outpatient doctor due to low blood pressure and acute kidney injury. 10/27/20   Enzo Bi, MD  metoprolol succinate (TOPROL-XL) 50 MG 24 hr tablet Take 1 tablet (50 mg total) by mouth daily. Take with or immediately following a meal. 10/27/20   Enzo Bi, MD  pantoprazole (PROTONIX) 40 MG tablet Take 1 tablet (40 mg total) by mouth 2 (two) times daily before a meal. Patient not taking: No sig reported 10/27/20 01/25/21  Enzo Bi, MD  paricalcitol [facility]) 1 MCG capsule Take 1 mcg by mouth every Monday, Wednesday, and Friday.    [provider]  PERCOCET 5-325 MG tablet Take 1 tablet by mouth every 8 (eight) hours as needed. 10/31/20   [provider]  polyethylene glycol (MIRALAX / GLYCOLAX) 17 g packet Take 17 g by mouth daily. Patient not taking: No sig reported 10/28/20   Enzo Bi, MD  ranolazine (RANEXA) 500 MG 12 hr tablet Take 500 mg by mouth 2 (two) times daily.    [provider]  rosuvastatin (CRESTOR) 10 MG tablet Take 10 mg by mouth daily.    [provider]  sucralfate (CARAFATE) 1 GM/10ML suspension Take 10 mLs (1 g total) by mouth 4 (four) times daily -  with meals and at bedtime for 10 days. 10/27/20 11/06/20  Enzo Bi, MD  vitamin B-12 1000 MCG tablet Take 1 tablet (1,000 mcg total) by mouth daily. 10/28/20   Enzo Bi, MD    Physical Exam: Vitals:   02/14/21 2247 02/14/21 2319 02/14/21 2330 02/14/21 2340  BP: (!) 88/54 (!) 85/64 (!) 94/33 (!)  102/58  Pulse: (!) 128 98 78 (!) 124  Resp: 18 20 (!) 28 13  Temp:      TempSrc:      SpO2: 96% 100% 99% 100%     Vitals:   02/14/21 2247 02/14/21 2319 02/14/21 2330 02/14/21 2340  BP: (!) 88/54 (!) 85/64 (!) 94/33 (!) 102/58  Pulse: (!) 128 98 78 (!) 124  Resp: 18 20 (!) 28 13  Temp:      TempSrc:      SpO2: 96% 100% 99% 100%      Constitutional: Alert and oriented x 2 . Not in any apparent distress HEENT:      Head: Normocephalic and atraumatic.         Eyes: PERLA, EOMI, Conjunctivae are normal.  Sclera is non-icteric.       Mouth/Throat: Mucous membranes are moist.       Neck: Supple with no signs of meningismus. Cardiovascular: irreg irregular and tachycardic. No murmurs, gallops, or rubs. 2+ symmetrical distal pulses are present . No JVD. No LE edema Respiratory: Respiratory effort normal .Lungs sounds clear bilaterally. No wheezes, crackles, or rhonchi.  Gastrointestinal: Soft, non tender, and non distended with positive bowel sounds.  Genitourinary: No CVA tenderness. Musculoskeletal: Nontender with normal range of motion in all extremities. No cyanosis, or erythema of extremities. Neurologic:  Face is symmetric. Moving all extremities. No gross focal neurologic deficits . Skin: Skin is warm, dry.  No rash or ulcers Psychiatric: somewhat irritable mood. Does not want to be bothered with questions   Labs on Admission: I have personally reviewed following labs and imaging studies  CBC: Recent Labs  Lab 02/14/21 2013  WBC 9.8  NEUTROABS 6.6  HGB 11.4*  HCT 34.0*  MCV 92.4  PLT 983*   Basic Metabolic Panel: Recent Labs  Lab 02/14/21 2013  NA 131*  K 4.4  CL 96*  CO2 25  GLUCOSE 229*  BUN 33*  CREATININE 1.71*  CALCIUM 8.7*   GFR: CrCl cannot be calculated (Unknown ideal weight.). Liver Function Tests: Recent Labs  Lab 02/14/21 2013  AST 29  ALT 14  ALKPHOS 94  BILITOT 0.7  PROT 6.3*  ALBUMIN 2.6*   No results for input(s): LIPASE, AMYLASE  in the last 168 hours. No results for input(s): AMMONIA in the last 168 hours. Coagulation Profile: No results for input(s): INR, PROTIME in the last 168 hours. Cardiac Enzymes: No results for input(s): CKTOTAL, CKMB, CKMBINDEX, TROPONINI in the last 168 hours. BNP (last 3 results) No results for input(s): PROBNP in the last 8760 hours. HbA1C: No results for input(s): HGBA1C in the last 72 hours. CBG: No results for input(s): GLUCAP in the last 168 hours. Lipid Profile: No results for input(s): CHOL, HDL, LDLCALC, TRIG, CHOLHDL, LDLDIRECT in the last 72 hours. Thyroid Function Tests: No results for input(s): TSH, T4TOTAL, FREET4, T3FREE, THYROIDAB in the last 72 hours. Anemia Panel: No results for input(s): VITAMINB12, FOLATE, FERRITIN, TIBC, IRON, RETICCTPCT in the last 72 hours. Urine analysis:    Component Value Date/Time   COLORURINE AMBER (A) 11/22/2020 1840   APPEARANCEUR CLOUDY (A) 11/22/2020 1840   APPEARANCEUR Cloudy (A) 10/25/2019 1533   LABSPEC 1.009 11/22/2020 1840   LABSPEC 1.008 07/03/2012 1616   PHURINE 5.0 11/22/2020 1840   GLUCOSEU 50 (A) 11/22/2020 1840   GLUCOSEU Negative 07/03/2012 1616   HGBUR SMALL (A) 11/22/2020 1840   BILIRUBINUR NEGATIVE 11/22/2020 1840   BILIRUBINUR Negative 10/25/2019 1533   BILIRUBINUR Negative 07/03/2012 [place] 11/22/2020 1840   PROTEINUR NEGATIVE 11/22/2020 1840   NITRITE NEGATIVE 11/22/2020 1840   LEUKOCYTESUR TRACE (A) 11/22/2020 1840   LEUKOCYTESUR Trace 07/03/2012 1616    Radiological Exams on Admission: CT Head Wo Contrast  Result Date: 02/14/2021 CLINICAL DATA:  Mental status change.  Unknown cause. EXAM: CT HEAD WITHOUT CONTRAST TECHNIQUE: Contiguous axial images were obtained from the base of the skull through the vertex without intravenous contrast. COMPARISON:  CT head 10/23/2008 FINDINGS: Brain: Similar-appearing right frontoparietotemporal chronic infarction. Patchy and confluent areas of decreased  attenuation are noted throughout the deep and periventricular white matter of the cerebral hemispheres bilaterally, compatible with chronic microvascular ischemic disease. No evidence of large-territorial acute infarction. No parenchymal hemorrhage. No mass lesion. No extra-axial collection.  No mass effect or midline shift. No hydrocephalus. Basilar cisterns are patent. Vascular: No hyperdense vessel. Atherosclerotic calcifications are present within the cavernous internal carotid and vertebral arteries. Skull: No acute fracture or focal lesion. Sinuses/Orbits: Paranasal sinuses and mastoid air cells are clear. Bilateral lens replacement. Otherwise the orbits are unremarkable. Other: Trace right occipital scalp subcutaneus soft tissue edema. IMPRESSION: No acute intracranial abnormality. Electronically Signed   By: Iven Finn M.D.   On: 02/14/2021 22:42   DG Chest Portable 1 View  Result Date: 02/14/2021 CLINICAL DATA:  Altered mental status EXAM: PORTABLE CHEST 1 VIEW COMPARISON:  11/22/2020 FINDINGS: Left-sided pacing device with leads over right atrium and right ventricle. No focal opacity or pleural effusion. Normal cardiomediastinal silhouette with aortic atherosclerosis. No pneumothorax. IMPRESSION: No active disease. Electronically Signed   By: Donavan Foil M.D.   On: 02/14/2021 20:31     Assessment/Plan 87 year old female with history of CAD s/p PCI 2009, PAF not on anticoagulation due to history of GI bleed in March 2022 from esophageal ulcer, SSS s/p PPM, hypothyroidism, CKD stage III, IDT2DM, hypothyroidism, hypertension, sent to the ED for evaluation of altered mental status with finding of rapid A. fib..    Rapid atrial fibrillation ([place]) - Patient in rapid A. fib with rate in the 140s - Continue diltiazem as BP will tolerate to get rate under 120 - IV fluids help with soft blood pressures - Patient currently off anticoagulation due to history of bleeding esophageal ulcer 10/2020  requiring blood transfusions - Will defer to cardiology for recommendations  COVID-positive, incidental - Incidental COVID-positive - Patient was sent for altered mental status but otherwise asymptomatic - We will treat with remdesivir for 3 days    Acute kidney injury superimposed on CKD lll ([place]) - Creatinine 1.71 above baseline of 1.16 - IV hydration - Monitor renal function and avoid nephrotoxins    Insulin dependent type 2 diabetes mellitus (HCC) - Sliding scale insulin coverage    Pacemaker secondary to SSS - Consider pacemaker interrogation in the a.m.    CAD (coronary artery disease) - Troponin negative x2 and patient denies chest pain.  EKG nonacute - Currently on Plavix, aspirin, Toprol and rosuvastatin    Hypothyroidism - Continue levothyroxine     History of GI bleed    Esophageal ulcer EGD March 2022 - Continue PPI - Hemoglobin at baseline    DVT prophylaxis: Lovenox  Code Status: full code  Family Communication:  none  Disposition Plan: Back to previous home environment Consults called: cardiology  Status:At the time of admission, it appears that the appropriate admission status for this patient is INPATIENT. This is judged to be reasonable and necessary in order to provide the required intensity of service to ensure the patient's safety given the presenting symptoms, physical exam findings, and initial radiographic and laboratory data in the context of their  Comorbid conditions.   Patient requires inpatient status due to high intensity of service, high risk for further deterioration and high frequency of surveillance required.   I certify that at the point of admission it is my clinical judgment that the patient will require inpatient hospital care spanning beyond [place] MD [facility]     02/15/2021, 12:01 AM

## 2021-02-15 NOTE — ED Notes (Signed)
Pt appears comfortable, resting in hospital bed. Respirations regular and unlabored, see charted vitals. Bed in low position with wheels locked, side rails up x2, high fall precautions in place. Will continue to monitor.

## 2021-02-15 NOTE — ED Notes (Signed)
Informed RN bed assigned 

## 2021-02-15 NOTE — ED Notes (Signed)
Attending provider messaged about current patient status. Patient continues to scream "let me out", tries to hit staff when we attempt to reorient/redirect her.

## 2021-02-15 NOTE — Progress Notes (Signed)
GOALS OF CARE DISCUSSION  The Clinical status was relayed to family in detail. Daughter [name] Updated and notified of patients medical condition.  Explained to family course of therapy and the modalities  COVID 19 infection is causing demise I have advised daughter to call family members in anticipation of probable death with in 66 hrs   Patient with Progressive multiorgan failure with a very high probablity of a very minimal chance of meaningful recovery despite all aggressive and optimal medical therapy.  PATIENT REMAINS DNR status  Family understands the situation.  PALLIATIVE CARE CARE TEAM TO FOLLOW UP   Family are satisfied with Plan of action and management. All questions answered  Additional CC time 25 mins   Jathen Sudano Patricia Pesa, M.D.  [facility]  Medical Director [employer] Director [facility] Cardio-Pulmonary Department

## 2021-02-15 NOTE — ED Notes (Signed)
Patient has ripped all over leads off and refuses to allow this RN to replace them.  Will notify doctor.

## 2021-02-15 NOTE — ED Notes (Signed)
Attempted to enter pt's room to assess pt, pt pulling at mittens, yelling "get out". I asked pt if she would like to eat lunch and she said "just get the fuck out". Fall precautions maintained. Will continue to monitor.

## 2021-02-15 NOTE — ED Notes (Signed)
Pt continues to yell, "get me out of here". Still unable to be redirected. Current HR 136. Awaiting response from attending MD.

## 2021-02-15 NOTE — ED Notes (Signed)
Dr. Fuller Plan notified of blood pressure.

## 2021-02-15 NOTE — Plan of Care (Signed)
PMT note:  Consult noted for Regina [name]. Attempted to call daughter to initiate conversation, but unable to reach her. Will reattempt at another time.

## 2021-02-15 NOTE — ED Notes (Signed)
Attempted to start PIV, unsuccessful.

## 2021-02-15 NOTE — Progress Notes (Signed)
PROGRESS NOTE    Regina [name]  PPI:[id number] DOB: Jun 03, 1934 DOA: 02/14/2021 PCP: System, Provider Not In   Chief Complaint  Patient presents with   Altered Mental Status    Brief Narrative: 87 year old female with history of CAD with PCI 2009, PAF not on anticoagulation due to history of GI bleed in March 2022 from esophageal ulcer, sick sinus syndrome with pacemaker in place, hypothyroidism, CKD stage III, ID T2DM, hypothyroidism, hypertension sent to the ED for altered mental status and psychiatric evaluation from the nursing facility. As per the report- patient was hospitalized 2 months prior for Klebsiella UTI.  On arrival she was alert .  She denied fever or chills or abdominal pain.  Denied cough or chest pain, nausea or vomiting or diarrhea.  Denied shortness of breath ED Course: On arrival, she was afebrile, BP initially 166/86 with pulse 138, respirations 31 with O2 sat 100% on room air.  She was noted to be in rapid A. fib on EKG.  Blood work mostly unremarkable with hemoglobin 11.4, WBC 9800, creatinine 1.71, up from baseline of 1.16. EKG as reviewed by me : A. fib with rate of 142 with no acute ST-T wave changes Imaging: Head CT with no acute findings.  Chest x-ray with no acute disease Patient tested positive for COVID. Patient was given IV metoprolol without improvement in the heart rate and subsequently Cardizem was given blood pressure was soft 94/33 with infusion.  And patient was admitted overnight. Patient has been placed on amiodarone drip cardiology was consulted overnight. Patient has had persistent soft blood pressure overnight but agitated asymptomatic.  Subjective: Seen this morning Overnight patient has been removing telemetry.  Agitated trying to hit the staff. Patient was trying to remove the ground, on questioning appears to be agitated. not in distress, on room air. Spoke with the nursing staff outside the room.   Assessment & Plan:  A. fib with RVR heart  rate in 140s in the ED.  Initially on Cardizem but hypotensive.  Patient transition to amiodarone infusion.  Cardiology has been consulted.  Not on long-term anticoagulation due to history of GI bleeding from esophageal ulcer in March/2022  Hypotension,suspect multifactorial with A. fib RVR, and meds- avoid Cardizem.  Supported IV fluid boluses as needed.  Relatively alert awake appears asymptomatic.  Troponins negative x2.  Agitation/altered mental status/Acute encephalopathy: Unknown baseline.Patient was sent in for confusion and psych evaluation per nursing facility.  Appears agitated, noncompliant with monitors.CT head on admission no acute finding history of UTI 2 months ago UA pending but afebrile and no leukocytosis.  Asked nurse to check UA ASAP.  COVID-positive in PCR: placed on remdesivir x3 days.  Continue isolation x10 days.  Currently on room air.  Chest x-ray was clear.  AKI on CKD stage IIIb: Baseline creatinine around 1.2, being hydrated IV fluids overnight for hypotension.  Follow-up BMP. Recent Labs  Lab 02/14/21 2013  BUN 33*  CREATININE 1.71*    Insulin dependent type 2 diabetes mellitus: Blood sugar fairly stable, continue sliding's insulin for now. Recent Labs  Lab 02/15/21 0757  GLUCAP 195*   SSS with PM in place-pending cartilage consultation cardiology with A. fib RVR currently in RVR  CAD troponin negative x2, continue Plavix aspirin Toprol statin  Hypothyroidism: Continue her Synthroid.  Acute kidney injury superimposed on CKD lll   History of GI bleed from esophageal ulcer.  Hemoglobin at baseline continue PPI Recent Labs  Lab 02/14/21 2013  HGB 11.4*  HCT 34.0*  CODE STATUS: Patient's daughter called back I was able to discuss in length, patient had wished not to receive resuscitation/life support measure if needed and daughter has requested DNR and I have made the change.   Diet Order             Diet heart healthy/carb modified Room service  appropriate? Yes; Fluid consistency: Thin  Diet effective now                   Patient's There is no height or weight on file to calculate BMI.  DVT prophylaxis: SCDs Start: 02/15/21 0000 Code Status:   Code Status: Full Code  Family Communication: plan of care discussed with patient's RN. Nursing facility unaware abt her baseline mentation. I called her daughter Georganna Skeans over the phone and left message to call back.  Addendum: I received a call from daughter at 20 am: Per daughter 2 nights ago she was seeing spiders and she called her yesterday someone is taking her medicine. She has had multiple falls at home and has been in and out of rehab and now moved to skilled nursing facility. Normally she is bedridden but conversant, no diagnoses of dementia and since last week has been confused.Her BP normally is low per daughter she does not know the exact no. Daughter confirmed DNR.  Addendum at 1140 Informed by nurse that patient blood pressure was in 60s.  I came at the bedside and evaluated.  I also called ICU Dr. Mortimer Fries for consultation due to persistent hypotension ordered for 500 mL bolus again- nd also put in palliative care consultation..  Patient is alert awake is not agitated at this time.  She is redirectable. Discussed with the bedside RN.  Continue to monitor.  Labs BMP pending from this morning reordered new sets of labs along with lactic acid CBC CMP. Lactic acid came at 2.6 likely from hypotension. UA with wbc>50 negative nitrite, large LE- ??UTI-placed on empiric rocephin, urine culture sent.  Blood pressure did respond to fluids. Seen by PCCM, placed on midodrine holding pressors for now.  Palliative care also saw the patient and discussed goals of care with patient and patient's daughter and possible comfort measures.  Later informed by palliative care that patient transitioned to comfort measures, repeat lactic acid/labs cancelled as focusing on comfort.   Status is:  Inpatient.  Remains inpatient appropriate because:IV treatments appropriate due to intensity of illness or inability to take PO and Inpatient level of care appropriate due to severity of illness  Dispo: The patient is from: SNF              Anticipated d/c is to: SNF              Patient currently is not medically stable to d/c.   Difficult to place patient No  Unresulted Labs (From admission, onward)     Start     Ordered   02/15/21 5956  Basic metabolic panel  Once,   STAT        02/15/21 0543   02/14/21 2359  Hemoglobin A1c  Once,   STAT       Comments: To assess prior glycemic control    02/15/21 0000   02/14/21 2001  Urinalysis, Complete w Microscopic  ONCE - STAT,   STAT        02/14/21 2001            Medications reviewed:  Scheduled Meds:  vitamin C  500 mg Oral  Daily   insulin aspart  0-15 Units Subcutaneous TID WC   insulin aspart  0-5 Units Subcutaneous QHS   zinc sulfate  220 mg Oral Daily   Continuous Infusions:  amiodarone Stopped (02/15/21 1021)   remdesivir 100 mg in NS 100 mL     sodium chloride      Consultants:see note  Procedures:see note  Antimicrobials: Anti-infectives (From admission, onward)    Start     Dose/Rate Route Frequency Ordered Stop   02/15/21 1000  remdesivir 100 mg in sodium chloride 0.9 % 100 mL IVPB       See Hyperspace for full Linked Orders Report.   100 mg 200 mL/hr over 30 Minutes Intravenous Daily 02/15/21 0029 02/17/21 0959   02/15/21 0030  remdesivir 200 mg in sodium chloride 0.9% 250 mL IVPB       See Hyperspace for full Linked Orders Report.   200 mg 580 mL/hr over 30 Minutes Intravenous Once 02/15/21 0029 02/15/21 0339      Culture/Microbiology    Component Value Date/Time   SDES  11/22/2020 1840    IN/OUT CATH URINE Performed at [facility], [address] [place], [place] [address]    [facility]  11/22/2020 1840    NONE Performed at [facility], [address] [place],  [place] [address]    CULT >=100,000 COLONIES/mL KLEBSIELLA PNEUMONIAE (A) 11/22/2020 1840   REPTSTATUS 11/25/2020 FINAL 11/22/2020 1840    Other culture-see note  Objective: Vitals: Today's Vitals   02/15/21 0600 02/15/21 0630 02/15/21 0912 02/15/21 1015  BP: (!) 77/50 (!) 85/35 (!) 97/56 (!) 74/58  Pulse: (!) 103  (!) 112 69  Resp: (!) 21 (!) 22 20 20   Temp:      TempSrc:      SpO2: 100%  96% 100%  PainSc:        Intake/Output Summary (Last 24 hours) at 02/15/2021 1028 Last data filed at 02/15/2021 0537 Gross per 24 hour  Intake 1250 ml  Output --  Net 1250 ml   There were no vitals filed for this visit. Weight change:   Intake/Output from previous [place]: 07/06 0701 - 07/07 0700 In: 1250 [IV Piggyback:1250] Out: -  Intake/Output this shift: No intake/output data recorded. There were no vitals filed for this visit.  Examination: General exam: Alert awake elderly, agitated. ON ROOM AIR HEENT:Oral mucosa moist, Ear/Nose WNL grossly,dentition normal. Respiratory system: bilaterally diminished, no crackles, no use of accessory muscle, non tender. Cardiovascular system: S1 & S2 +, irregularly irregular, rapid, no JVD. Gastrointestinal system: Abdomen soft, NT,ND, BS+. Nervous System:Alert, awake, moving extremities Extremities: no edema, distal peripheral pulses palpable.  Skin: No rashes,no icterus. MSK: Normal muscle bulk,tone, power  Data Reviewed: I have personally reviewed following labs and imaging studies CBC: Recent Labs  Lab 02/14/21 2013  WBC 9.8  NEUTROABS 6.6  HGB 11.4*  HCT 34.0*  MCV 92.4  PLT 169*   Basic Metabolic Panel: Recent Labs  Lab 02/14/21 2013  NA 131*  K 4.4  CL 96*  CO2 25  GLUCOSE 229*  BUN 33*  CREATININE 1.71*  CALCIUM 8.7*   GFR: CrCl cannot be calculated (Unknown ideal weight.). Liver Function Tests: Recent Labs  Lab 02/14/21 2013  AST 29  ALT 14  ALKPHOS 94  BILITOT 0.7  PROT 6.3*  ALBUMIN 2.6*   No results for  input(s): LIPASE, AMYLASE in the last 168 hours. No results for input(s): AMMONIA in the last 168 hours. Coagulation Profile:  No results for input(s): INR, PROTIME in the last 168 hours. Cardiac Enzymes: No results for input(s): CKTOTAL, CKMB, CKMBINDEX, TROPONINI in the last 168 hours. BNP (last 3 results) No results for input(s): PROBNP in the last 8760 hours. HbA1C: No results for input(s): HGBA1C in the last 72 hours. CBG: Recent Labs  Lab 02/15/21 0757  GLUCAP 195*   Lipid Profile: No results for input(s): CHOL, HDL, LDLCALC, TRIG, CHOLHDL, LDLDIRECT in the last 72 hours. Thyroid Function Tests: No results for input(s): TSH, T4TOTAL, FREET4, T3FREE, THYROIDAB in the last 72 hours. Anemia Panel: No results for input(s): VITAMINB12, FOLATE, FERRITIN, TIBC, IRON, RETICCTPCT in the last 72 hours. Sepsis Labs: No results for input(s): PROCALCITON, LATICACIDVEN in the last 168 hours.  Recent Results (from the past 240 hour(s))  Resp Panel by RT-PCR (Flu A&B, Covid) Nasopharyngeal Swab     Status: Abnormal   Collection Time: 02/14/21 11:00 PM   Specimen: Nasopharyngeal Swab; Nasopharyngeal(NP) swabs in vial transport medium  Result Value Ref Range Status   SARS Coronavirus 2 by RT PCR POSITIVE (A) NEGATIVE Final    Comment: RESULT CALLED TO, READ BACK BY AND VERIFIED WITH: JOSHUA MOTES@[email or web address]  02/15/21 RH (NOTE) SARS-CoV-2 target nucleic acids are DETECTED.  The SARS-CoV-2 RNA is generally detectable in upper respiratory specimens during the acute phase of infection. Positive results are indicative of the presence of the identified virus, but do not rule out bacterial infection or co-infection with other pathogens not detected by the test. Clinical correlation with patient history and other diagnostic information is necessary to determine patient infection status. The expected result is Negative.  Fact Sheet for Patients: EntrepreneurPulse.com.au  Fact Sheet  for Healthcare Providers: IncredibleEmployment.be  This test is not yet approved or cleared by the [place] FDA and  has been authorized for detection and/or diagnosis of SARS-CoV-2 by FDA under an Emergency Use Authorization (EUA).  This EUA will remain in effect (meaning this test can be used)  for the duration of  the COVID-19 declaration under Section 564(b)(1) of the Act, 21 U.S.C. section 360bbb-3(b)(1), unless the authorization is terminated or revoked sooner.     Influenza A by PCR NEGATIVE NEGATIVE Final   Influenza B by PCR NEGATIVE NEGATIVE Final    Comment: (NOTE) The Xpert Xpress SARS-CoV-2/FLU/RSV plus assay is intended as an aid in the diagnosis of influenza from Nasopharyngeal swab specimens and should not be used as a sole basis for treatment. Nasal washings and aspirates are unacceptable for Xpert Xpress SARS-CoV-2/FLU/RSV testing.  Fact Sheet for Patients: EntrepreneurPulse.com.au  Fact Sheet for Healthcare Providers: IncredibleEmployment.be  This test is not yet approved or cleared by the [place] FDA and has been authorized for detection and/or diagnosis of SARS-CoV-2 by FDA under an Emergency Use Authorization (EUA). This EUA will remain in effect (meaning this test can be used) for the duration of the COVID-19 declaration under Section 564(b)(1) of the Act, 21 U.S.C. section 360bbb-3(b)(1), unless the authorization is terminated or revoked.  Performed at [facility], [address], [place], [place] [address]      Radiology Studies: CT Head Wo Contrast  Result Date: 02/14/2021 CLINICAL DATA:  Mental status change.  Unknown cause. EXAM: CT HEAD WITHOUT CONTRAST TECHNIQUE: Contiguous axial images were obtained from the base of the skull through the vertex without intravenous contrast. COMPARISON:  CT head 10/23/2008 FINDINGS: Brain: Similar-appearing right frontoparietotemporal  chronic infarction. Patchy and confluent areas of decreased attenuation are noted throughout the deep and periventricular white  matter of the cerebral hemispheres bilaterally, compatible with chronic microvascular ischemic disease. No evidence of large-territorial acute infarction. No parenchymal hemorrhage. No mass lesion. No extra-axial collection. No mass effect or midline shift. No hydrocephalus. Basilar cisterns are patent. Vascular: No hyperdense vessel. Atherosclerotic calcifications are present within the cavernous internal carotid and vertebral arteries. Skull: No acute fracture or focal lesion. Sinuses/Orbits: Paranasal sinuses and mastoid air cells are clear. Bilateral lens replacement. Otherwise the orbits are unremarkable. Other: Trace right occipital scalp subcutaneus soft tissue edema. IMPRESSION: No acute intracranial abnormality. Electronically Signed   By: Iven Finn M.D.   On: 02/14/2021 22:42   DG Chest Portable 1 View  Result Date: 02/14/2021 CLINICAL DATA:  Altered mental status EXAM: PORTABLE CHEST 1 VIEW COMPARISON:  11/22/2020 FINDINGS: Left-sided pacing device with leads over right atrium and right ventricle. No focal opacity or pleural effusion. Normal cardiomediastinal silhouette with aortic atherosclerosis. No pneumothorax. IMPRESSION: No active disease. Electronically Signed   By: Donavan Foil M.D.   On: 02/14/2021 20:31     LOS: 1 [place]   Antonieta Pert, MD [facility]  02/15/2021, 10:28 AM

## 2021-02-15 NOTE — Consult Note (Signed)
Cardiology Consultation:   Patient ID: Regina [name] MRN: [id number]; DOB: 1934-07-18  Admit date: 02/14/2021 Date of Consult: 02/15/2021  PCP:  System, Provider Not In   [facility] Providers Cardiologist: New to [facility] Physician requesting consult: Regina [name] Reason for consult: Atrial fibrillation with RVR   Patient Profile:   Regina [name] is a 87 y.o. female with a hx of persistent atrial fibrillation followed by Dr. Rebecka Apley, pacemaker for sick sinus syndrome, morbid obesity, debility, dementia, presenting from nursing facility/[facility] with mental status changes, yelling, screaming, confused Noted to be in atrial fibrillation with RVR  History of Present Illness:   Regina [name] has a history of hypertension, hyperlipidemia, diabetes, coronary artery disease, paroxysmal/persistent atrial fibrillation, not on anticoagulation secondary to high fall risk, prior GI bleeds March 2022 esophageal ulcer, chronic kidney disease, pacemaker, Coronary disease prior PCI 2009 Prior UTI with Klebsiella, hospitalized 2 months ago  On arrival reported that she was hurting all over, some shortness of breath, was very agitated  COVID positive  Given diltiazem bolus and infusion, hypotension noted Initiated on amiodarone for rate control During her stay in the ER, pulled out all of her IVs Difficulty replacing IVs  Hypotensive with tachycardia overnight, has received IV fluids with slow improvement in her blood pressure, mild improvement in her mentation, less agitation  On my rounds systolic pressures in the 90s, amiodarone has been held for bradycardia, rates were in the 90s She was mentating well, felt that she did not have a UTI has had no dysuria but she was confused, felt she was driving a vehicle, did not know where she was  Past Medical History:  Diagnosis Date   Breast cancer, left ([place]) 2013   Mastectomy.    CAD (coronary artery disease)    s/p cath in 2009 with 2  stents   Diabetes mellitus without complication (HCC)    GERD (gastroesophageal reflux disease)    HLD (hyperlipidemia)    Hypertension    Hypothyroidism    Paroxysmal atrial fibrillation (HCC)     Past Surgical History:  Procedure Laterality Date   BREAST LUMPECTOMY     CARDIAC CATHETERIZATION     CHOLECYSTECTOMY     ESOPHAGOGASTRODUODENOSCOPY (EGD) WITH PROPOFOL N/A 10/20/2020   Procedure: ESOPHAGOGASTRODUODENOSCOPY (EGD) WITH PROPOFOL;  Surgeon: Lin Landsman, MD;  Location: [facility];  Service: Gastroenterology;  Laterality: N/A;   MASTECTOMY Left    PACEMAKER INSERTION Left 04/21/2019   Procedure: Pacemaker Change Out;  Surgeon: Cletis Athens, MD;  Location: [facility];  Service: Cardiovascular;  Laterality: Left;   STENT PLACEMENT VASCULAR ([place] HX)       Home Medications:  Prior to Admission medications   Medication Sig Start Date End Date Taking? Authorizing Provider  acetaminophen (TYLENOL) 325 MG tablet Take 650 mg by mouth at bedtime.   Yes [provider]  acetaminophen (TYLENOL) 500 MG tablet Take 500 mg by mouth every 6 (six) hours as needed for mild pain.   Yes [provider]  aspirin EC 81 MG tablet Take 81 mg by mouth daily.   Yes [provider]  bumetanide (BUMEX) 2 MG tablet Hold until followup with outpatient doctor due to low blood pressure and acute kidney injury. Patient taking differently: Take 2 mg by mouth daily. 10/27/20  Yes Enzo Bi, MD  fluconazole (DIFLUCAN) 150 MG tablet Take 150 mg by mouth every Thursday.   Yes [provider]  fluticasone (FLONASE) 50 MCG/ACT nasal spray Place 2 sprays  into both nostrils daily as needed for allergies.   Yes [provider]  insulin aspart (NOVOLOG) 100 UNIT/ML injection Inject 3 Units into the skin 3 (three) times daily with meals. Patient taking differently: Inject 0-12 Units into the skin See admin instructions. Inject under the skin with meals according to  sliding scale 150< 0u 151-200: 2u 201-250: 4u 251-300: 6u 301-350: 8u 351-400:10u 401-450: 12u 10/27/20  Yes Enzo Bi, MD  insulin glargine (LANTUS) 100 UNIT/ML injection Inject 0.13 mLs (13 Units total) into the skin at bedtime. 10/27/20  Yes Enzo Bi, MD  ketoconazole (NIZORAL) 2 % shampoo Apply 1 application topically once a week.   Yes [provider]  levothyroxine (SYNTHROID) 75 MCG tablet Take 75 mcg by mouth daily. 07/22/19  Yes [provider]  lidocaine (LIDODERM) 5 % Place 3 patches onto the skin daily. Patient taking differently: Place 3 patches onto the skin at bedtime. (2 to lower back, 1 to left arm) 10/27/20  Yes Enzo Bi, MD  metoprolol succinate (TOPROL-XL) 50 MG 24 hr tablet Take 1 tablet (50 mg total) by mouth daily. Take with or immediately following a meal. 10/27/20  Yes Enzo Bi, MD  mirtazapine (REMERON) 7.5 MG tablet Take 7.5 mg by mouth at bedtime.   Yes [provider]  oxyCODONE-acetaminophen (PERCOCET/ROXICET) 5-325 MG tablet Take 1 tablet by mouth every 8 (eight) hours as needed for moderate pain.   Yes [provider]  pantoprazole (PROTONIX) 40 MG tablet Take 40 mg by mouth 2 (two) times daily.   Yes [provider]  paricalcitol (ZEMPLAR) 2 MCG capsule Take 2 mcg by mouth every Monday, Wednesday, and Friday.   Yes [provider]  polyethylene glycol (MIRALAX / GLYCOLAX) 17 g packet Take 17 g by mouth daily. 10/28/20  Yes Enzo Bi, MD  potassium chloride SA (KLOR-CON) 20 MEQ tablet Take 40 mEq by mouth daily.   Yes [provider]  ranolazine (RANEXA) 500 MG 12 hr tablet Take 500 mg by mouth 2 (two) times daily.   Yes [provider]  rosuvastatin (CRESTOR) 10 MG tablet Take 10 mg by mouth daily in the afternoon.   Yes [provider]  senna (SENOKOT) 8.6 MG TABS tablet Take 2 tablets by mouth See admin instructions. Take 2 tablets (17.2mg ) by mouth twice daily every other Amini   Yes  [provider]  sucralfate (CARAFATE) 1 GM/10ML suspension Take 1 g by mouth 4 (four) times daily -  with meals and at bedtime.   Yes [provider]  vitamin B-12 1000 MCG tablet Take 1 tablet (1,000 mcg total) by mouth daily. 10/28/20  Yes Enzo Bi, MD    Inpatient Medications: Scheduled Meds:  vitamin C  500 mg Oral Daily   insulin aspart  0-15 Units Subcutaneous TID WC   insulin aspart  0-5 Units Subcutaneous QHS   midodrine  10 mg Oral TID WC   zinc sulfate  220 mg Oral Daily   Continuous Infusions:  sodium chloride Stopped (02/15/21 1235)   sodium chloride 50 mL/hr at 02/15/21 1247   amiodarone Stopped (02/15/21 1021)   phenylephrine (NEO-SYNEPHRINE) Adult infusion Stopped (02/15/21 1305)   remdesivir 100 mg in NS 100 mL Stopped (02/15/21 1057)   PRN Meds: acetaminophen, metoprolol tartrate, ondansetron (ZOFRAN) IV  Allergies:   No Known Allergies  Social History:   Social History   Socioeconomic History   Marital status: Divorced    Spouse name: Not on file  Number of children: Not on file   Years of education: Not on file   Highest education level: Not on file  Occupational History   Not on file  Tobacco Use   Smoking status: Never   Smokeless tobacco: Never  Substance and Sexual Activity   Alcohol use: No    Alcohol/week: 0.0 standard drinks   Drug use: No   Sexual activity: Not Currently  Other Topics Concern   Not on file  Social History Narrative   Not on file   Social Determinants of Health   Financial Resource Strain: Not on file  Food Insecurity: Not on file  Transportation Needs: Not on file  Physical Activity: Not on file  Stress: Not on file  Social Connections: Not on file  Intimate Partner Violence: Not on file    Family History:    Family History  Problem Relation Age of Onset   Kidney failure Mother    Lung cancer Father    Throat cancer Son      ROS:  Please see the history of present illness.  Review of  Systems  Constitutional: Negative.   HENT: Negative.    Respiratory: Negative.    Cardiovascular: Negative.   Gastrointestinal: Negative.   Musculoskeletal: Negative.   Neurological: Negative.   Psychiatric/Behavioral: Negative.    All other systems reviewed and are negative.   Physical Exam/Data:   Vitals:   02/15/21 1130 02/15/21 1142 02/15/21 1230 02/15/21 1303  BP: (!) 67/43 (!) 78/67 125/79 119/84  Pulse: 82 (!) 110 92   Resp: 19 (!) 28 18 15   Temp:      TempSrc:      SpO2: 100% 99% 98%     Intake/Output Summary (Last 24 hours) at 02/15/2021 1314 Last data filed at 02/15/2021 0537 Gross per 24 hour  Intake 1250 ml  Output --  Net 1250 ml   Last 3 Weights 01/12/2021 12/15/2020 12/11/2020  Weight (lbs) 202 lb 4.8 oz 202 lb 4.8 oz 216 lb 4.8 oz  Weight (kg) 91.763 kg 91.763 kg 98.113 kg     There is no height or weight on file to calculate BMI.  General:  Well nourished, well developed, in no acute distress HEENT: normal Lymph: no adenopathy Neck: no JVD Endocrine:  No thryomegaly Vascular: No carotid bruits; FA pulses 2+ bilaterally without bruits  Cardiac:  normal S1, S2; RRR; no murmur  Lungs:  clear to auscultation bilaterally, no wheezing, rhonchi or rales  Abd: soft, nontender, no hepatomegaly  Ext: no edema Musculoskeletal:  No deformities, BUE and BLE strength normal and equal Skin: warm and dry  Neuro:  CNs 2-12 intact, no focal abnormalities noted Psych:  Normal affect   EKG:  The EKG was personally reviewed and demonstrates:   Atrial fibrillation with rate 132 bpm, nonspecific ST-T wave abnormality  Telemetry:  Telemetry was personally reviewed and demonstrates:   Atrial fibrillation rate in the 90s  Relevant CV Studies:  Echocardiogram pending  Laboratory Data:  High Sensitivity Troponin:   Recent Labs  Lab 02/14/21 2013 02/14/21 2210  TROPONINIHS 17 14     Chemistry Recent Labs  Lab 02/14/21 2013 02/15/21 1207  NA 131* 137  K 4.4 3.4*   CL 96* 106  CO2 25 25  GLUCOSE 229* 85  BUN 33* 29*  CREATININE 1.71* 1.42*  CALCIUM 8.7* 7.8*  GFRNONAA 29* 36*  ANIONGAP 10 6    Recent Labs  Lab 02/14/21 2013 02/15/21 1207  PROT 6.3* 4.8*  ALBUMIN 2.6* 2.0*  AST 29 29  ALT 14 13  ALKPHOS 94 64  BILITOT 0.7 0.3   Hematology Recent Labs  Lab 02/14/21 2013 02/15/21 1207  WBC 9.8 9.4  RBC 3.68* 2.97*  HGB 11.4* 9.1*  HCT 34.0* 27.9*  MCV 92.4 93.9  MCH 31.0 30.6  MCHC 33.5 32.6  RDW 16.4* 16.7*  PLT 426* 340   BNPNo results for input(s): BNP, PROBNP in the last 168 hours.  DDimer No results for input(s): DDIMER in the last 168 hours.   Radiology/Studies:  CT Head Wo Contrast  Result Date: 02/14/2021 CLINICAL DATA:  Mental status change.  Unknown cause. EXAM: CT HEAD WITHOUT CONTRAST TECHNIQUE: Contiguous axial images were obtained from the base of the skull through the vertex without intravenous contrast. COMPARISON:  CT head 10/23/2008 FINDINGS: Brain: Similar-appearing right frontoparietotemporal chronic infarction. Patchy and confluent areas of decreased attenuation are noted throughout the deep and periventricular white matter of the cerebral hemispheres bilaterally, compatible with chronic microvascular ischemic disease. No evidence of large-territorial acute infarction. No parenchymal hemorrhage. No mass lesion. No extra-axial collection. No mass effect or midline shift. No hydrocephalus. Basilar cisterns are patent. Vascular: No hyperdense vessel. Atherosclerotic calcifications are present within the cavernous internal carotid and vertebral arteries. Skull: No acute fracture or focal lesion. Sinuses/Orbits: Paranasal sinuses and mastoid air cells are clear. Bilateral lens replacement. Otherwise the orbits are unremarkable. Other: Trace right occipital scalp subcutaneus soft tissue edema. IMPRESSION: No acute intracranial abnormality. Electronically Signed   By: Iven Finn M.D.   On: 02/14/2021 22:42   DG Chest  Portable 1 View  Result Date: 02/14/2021 CLINICAL DATA:  Altered mental status EXAM: PORTABLE CHEST 1 VIEW COMPARISON:  11/22/2020 FINDINGS: Left-sided pacing device with leads over right atrium and right ventricle. No focal opacity or pleural effusion. Normal cardiomediastinal silhouette with aortic atherosclerosis. No pneumothorax. IMPRESSION: No active disease. Electronically Signed   By: Donavan Foil M.D.   On: 02/14/2021 20:31   [place] EKG SITE RITE  Result Date: 02/15/2021 If Site Rite image not attached, placement could not be confirmed due to current cardiac rhythm.    Assessment and Plan:   A/P: Atrial fibrillation with RVR Atrial fibrillation dating back to at least January 2022 Accelerated rate in the setting of agitation, possible sepsis with associated hypotension, prerenal state, COVID-positive infection -Blood pressure and rate has improved with IV fluids, amiodarone (now held for bradycardia) -Would  look to discontinue amiodarone as rate has improved and blood pressure stabilized Urine culture pending Not on anticoagulation per outside notes given history of GI bleed, fall risk -Once blood pressure has stabilized, will look to restart her outpatient metoprolol succinate (outpatient dose 50 mg daily)  Acute on chronic renal failure Creatinine 1.7, down to 1.42 with IV fluids, baseline 1.2 Continue gentle fluids ----Bumex is on her medication list, will need to confirm whether she is taking this or not  Mental status changes Urine culture pending COVID positive Dehydration  Sick sinus syndrome, pacer Followed by Dr. Rebecka Apley outpatient  Diabetes type 2, insulin-dependent Management per hospitalist service A1c pending  Hypotension Unable to exclude sepsis, Consider blood cultures, urine pending Gentle IV fluids, blood pressure does appear to be stabilizing  COVID infection Being treated with remdesivir, supportive care with IV fluids     Total encounter time  more than 110 minutes  Greater than 50% was spent in counseling and coordination of care with the patient    For questions or updates, please  contact [facility] Please consult [email or web address] for contact info under    Signed, Ida Rogue, MD  02/15/2021 1:14 PM

## 2021-02-15 NOTE — ED Notes (Signed)
Prn medication given, see mar. Pt continues to yell at staff and attempt to hit staff. Attempted to help pt drink water after med admin and she spit at staff. Bed locked in low position with side rails up, high fall precautions in place. Will continue to monitor.

## 2021-02-15 NOTE — ED Notes (Signed)
Pt appears uncomfortable in bed, is restless and grimacing. PRN meds administered, see mar.

## 2021-02-15 NOTE — Consult Note (Addendum)
Consultation Note Date: 02/15/2021   Patient Name: Regina [name]  DOB: August 20, 1933  MRN: [id number]  Age / Sex: 87 y.o., female  PCP: System, Provider Not In Referring Physician: Antonieta Pert, MD  Reason for Consultation: Establishing goals of care  HPI/Patient Profile: Regina [name] is a 87 y.o. female with medical history significant for  CAD s/p PCI 2009, PAF not on anticoagulation due to history of GI bleed in March 2022 from esophageal ulcer, SSS s/p PPM, hypothyroidism, CKD stage III, IDT2DM, hypothyroidism, hypertension,  who presents to the ED for evaluation of altered mental status.  Patient was hospitalized 2 months prior for Klebsiella UTI.  Clinical Assessment and Goals of Care: Patient is currently on COVID precautions an per staff has been confused and combative.  Called to speak to her daughter [name].  [name] states that the patient is widowed, and has 1/ 3 children living, which is her.    She states that her mother fell in March and had a brief stay in skilled nursing, and could not wait to leave.  She states her mother was home from rehab for around 24 hours, and during that time fell 3 times, and had to return to the hospital.  She states her mother has been in [facility] since May  She states that things have been a "downhill situation since then".  [name] tells me that her mother's p.o. intake is declining as she does not like a pured diet of any form.  [name] shares that she has brought her mother food which she would not eat, and she especially does not like what she is being offered at her facility.  She states her mother began to become delirious and was seeing spiders on the wall, and complained to her daughter that the staff was throwing cats on the bed at the hospital, when patient was not at the hospital and was still at her facility.  She states her mother is argumentative at baseline,  but has had a lot of difficulty with getting along with other patients and staff.  She states she is aware of the aggressiveness her mother has taken on her over the past few days, and is concerned that her current facility will not want to take her back.  We discussed her diagnoses, prognosis, GOC, EOL wishes disposition and options.  A detailed discussion was had today regarding advanced directives.  Concepts specific to code status, artifical feeding and hydration, IV antibiotics and rehospitalization were discussed.  The difference between an aggressive medical intervention path and a comfort care path was discussed.  Values and goals of care important to patient and family were attempted to be elicited.  Discussed limitations of medical interventions to prolong quality of life in some situations and discussed the concept of human mortality.  She states that she knows that her mother "wants it all to stop, the sticks and pokes and everything".  She states that her mother was recently transitioned to being a long-term care resident at [facility]  and is very unhappy.  She states she is no longer amenable to her quality of life.  She states that she would like to honor her mother's wishes and would like to focus only on comfort for what time her mother has left.  She states she was advised by previous healthcare providers that her mother may only have 12 to 24 hours left to live.  She tells me she wants no further life-prolonging care including antibiotic.  She states that she would like to stop glucose checks.    I completed a MOST form today with daughter through [facility].The patient's daughter outlined their wishes for the following treatment decisions:  Cardiopulmonary Resuscitation: Do Not Attempt Resuscitation (DNR/No CPR)  Medical Interventions: Comfort Measures: Keep clean, warm, and dry. Use medication by any route, positioning, wound care, and other measures to relieve pain and  suffering. Use oxygen, suction and manual treatment of airway obstruction as needed for comfort. Do not transfer to the hospital unless comfort needs cannot be met in current location.  Antibiotics: No antibiotics (use other measures to relieve symptoms)  IV Fluids: No IV fluids (provide other measures to ensure comfort)  Feeding Tube: No feeding tube     SUMMARY OF RECOMMENDATIONS   Transition to comfort care at this time. PRN medications for comfort placed including PRN IV morphine.  This order does include instructions that if 3 doses are given within 1 hour then the provider should be contacted to initiate a morphine infusion.    Prognosis:  < 2 weeks      Primary Diagnoses: Present on Admission:  Rapid atrial fibrillation (HCC)  Hypothyroidism  Hypotension  Pacemaker  CAD (coronary artery disease)   I have reviewed the medical record, interviewed the patient and family, and examined the patient. The following aspects are pertinent.  Past Medical History:  Diagnosis Date   Breast cancer, left ([place]) 2013   Mastectomy.    CAD (coronary artery disease)    s/p cath in 2009 with 2 stents   Diabetes mellitus without complication (HCC)    GERD (gastroesophageal reflux disease)    HLD (hyperlipidemia)    Hypertension    Hypothyroidism    Paroxysmal atrial fibrillation (HCC)    Social History   Socioeconomic History   Marital status: Divorced    Spouse name: Not on file   Number of children: Not on file   Years of education: Not on file   Highest education level: Not on file  Occupational History   Not on file  Tobacco Use   Smoking status: Never   Smokeless tobacco: Never  Substance and Sexual Activity   Alcohol use: No    Alcohol/week: 0.0 standard drinks   Drug use: No   Sexual activity: Not Currently  Other Topics Concern   Not on file  Social History Narrative   Not on file   Social Determinants of Health   Financial Resource Strain: Not on file   Food Insecurity: Not on file  Transportation Needs: Not on file  Physical Activity: Not on file  Stress: Not on file  Social Connections: Not on file   Family History  Problem Relation Age of Onset   Kidney failure Mother    Lung cancer Father    Throat cancer Son    Scheduled Meds: Continuous Infusions: PRN Meds:.acetaminophen **OR** acetaminophen, antiseptic oral rinse, glycopyrrolate **OR** glycopyrrolate **OR** glycopyrrolate, haloperidol **OR** haloperidol **OR** haloperidol lactate, LORazepam **OR** LORazepam **OR** LORazepam, morphine injection, OLANZapine zydis, ondansetron **OR**  ondansetron (ZOFRAN) IV, polyvinyl alcohol Medications Prior to Admission:  Prior to Admission medications   Medication Sig Start Date End Date Taking? Authorizing Provider  acetaminophen (TYLENOL) 325 MG tablet Take 650 mg by mouth at bedtime.   Yes [provider]  acetaminophen (TYLENOL) 500 MG tablet Take 500 mg by mouth every 6 (six) hours as needed for mild pain.   Yes [provider]  aspirin EC 81 MG tablet Take 81 mg by mouth daily.   Yes [provider]  bumetanide (BUMEX) 2 MG tablet Hold until followup with outpatient doctor due to low blood pressure and acute kidney injury. Patient taking differently: Take 2 mg by mouth daily. 10/27/20  Yes Enzo Bi, MD  fluconazole (DIFLUCAN) 150 MG tablet Take 150 mg by mouth every Thursday.   Yes [provider]  fluticasone (FLONASE) 50 MCG/ACT nasal spray Place 2 sprays into both nostrils daily as needed for allergies.   Yes [provider]  insulin aspart (NOVOLOG) 100 UNIT/ML injection Inject 3 Units into the skin 3 (three) times daily with meals. Patient taking differently: Inject 0-12 Units into the skin See admin instructions. Inject under the skin with meals according to sliding scale 150< 0u 151-200: 2u 201-250: 4u 251-300: 6u 301-350: 8u 351-400:10u 401-450: 12u 10/27/20  Yes Enzo Bi, MD   insulin glargine (LANTUS) 100 UNIT/ML injection Inject 0.13 mLs (13 Units total) into the skin at bedtime. 10/27/20  Yes Enzo Bi, MD  ketoconazole (NIZORAL) 2 % shampoo Apply 1 application topically once a week.   Yes [provider]  levothyroxine (SYNTHROID) 75 MCG tablet Take 75 mcg by mouth daily. 07/22/19  Yes [provider]  lidocaine (LIDODERM) 5 % Place 3 patches onto the skin daily. Patient taking differently: Place 3 patches onto the skin at bedtime. (2 to lower back, 1 to left arm) 10/27/20  Yes Enzo Bi, MD  metoprolol succinate (TOPROL-XL) 50 MG 24 hr tablet Take 1 tablet (50 mg total) by mouth daily. Take with or immediately following a meal. 10/27/20  Yes Enzo Bi, MD  mirtazapine (REMERON) 7.5 MG tablet Take 7.5 mg by mouth at bedtime.   Yes [provider]  oxyCODONE-acetaminophen (PERCOCET/ROXICET) 5-325 MG tablet Take 1 tablet by mouth every 8 (eight) hours as needed for moderate pain.   Yes [provider]  pantoprazole (PROTONIX) 40 MG tablet Take 40 mg by mouth 2 (two) times daily.   Yes [provider]  paricalcitol (ZEMPLAR) 2 MCG capsule Take 2 mcg by mouth every Monday, Wednesday, and Friday.   Yes [provider]  polyethylene glycol (MIRALAX / GLYCOLAX) 17 g packet Take 17 g by mouth daily. 10/28/20  Yes Enzo Bi, MD  potassium chloride SA (KLOR-CON) 20 MEQ tablet Take 40 mEq by mouth daily.   Yes [provider]  ranolazine (RANEXA) 500 MG 12 hr tablet Take 500 mg by mouth 2 (two) times daily.   Yes [provider]  rosuvastatin (CRESTOR) 10 MG tablet Take 10 mg by mouth daily in the afternoon.   Yes [provider]  senna (SENOKOT) 8.6 MG TABS tablet Take 2 tablets by mouth See admin instructions. Take 2 tablets (17.$RemoveBefor'2mg'NikLenQuTnYo$ ) by mouth twice daily every other Panik   Yes [provider]  sucralfate (CARAFATE) 1 GM/10ML suspension Take 1 g by mouth 4 (four) times daily -  with meals and  at bedtime.   Yes [provider]  vitamin B-12 1000 MCG tablet Take 1 tablet (  1,000 mcg total) by mouth daily. 10/28/20  Yes Enzo Bi, MD   No Known Allergies   Vital Signs: BP 105/82   Pulse 92   Temp 98 F (36.7 C) (Oral)   Resp (!) 51   SpO2 100%  Pain Scale: 0-10   Pain Score: Asleep   SpO2: SpO2: 100 % O2 Device:SpO2: 100 % O2 Flow Rate: .   IO: Intake/output summary:  Intake/Output Summary (Last 24 hours) at 02/15/2021 1508 Last data filed at 02/15/2021 0537 Gross per 24 hour  Intake 1250 ml  Output --  Net 1250 ml    LBM:   Baseline Weight:   Most recent weight:          Time In: 1:40 Time Out: 3:00 Time Total: 80 min Greater than 50%  of this time was spent counseling and coordinating care related to the above assessment and plan.  COVID-19 DISASTER DECLARATION:    FULL CONTACT PHYSICAL EXAMINATION WAS NOT POSSIBLE DUE TO TREATMENT OF COVID-19  AND CONSERVATION OF PERSONAL PROTECTIVE EQUIPMENT.   Patient assessed or the symptoms described in the history of present illness.  In the context of the Global COVID-19 pandemic, which necessitated consideration that the patient might be at risk for infection with the SARS-CoV-2 virus that causes COVID-19, Institutional protocols and algorithms that pertain to the evaluation of patients at risk for COVID-19 are in a state of rapid change based on information released by regulatory bodies including the CDC and federal and state organizations. These policies and algorithms were followed during the patient's care while in hospital.   Signed by: Asencion Gowda, NP   Please contact Palliative Medicine Team phone at [phone] for questions and concerns.  For individual provider: See Shea Evans

## 2021-02-15 NOTE — ED Notes (Addendum)
Per conversation with Alfonse Spruce, NP - lab in room notified labs do not need to be obtained. Plan is to transition to comfort care.

## 2021-02-15 NOTE — ED Notes (Signed)
Requested Remdesivir from pharmacy.

## 2021-02-15 NOTE — Consult Note (Addendum)
NAME:  Regina [name], MRN:  [id number], DOB:  Dec 31, 1933, LOS: 1 ADMISSION DATE:  02/14/2021, CONSULTATION DATE:  02/15/2021 REFERRING MD:  Dr. Maren Beach, CHIEF COMPLAINT:  Altered Mental Status   Brief Pt Description / Synopsis:  87 y.o. Female admitted with Acute Metabolic Encephalopathy, Atrial Fibrillation w/ RVR, incidental finding of COVID-19 Infection, and AKI on CKD Stage III.  On 02/15/21 developed Hypotension, multifactorial in etiology (Hypovolemic +/- Septic +/- Cardiogenic).  PCCM consulted due to need for possible vasopressors.  History of Present Illness:  Regina [name] is a 87 year old female with a past medical history as listed below who presented to [facility] ED on 02/14/2021 from her nursing facility due to altered mental status and agitation.  Of note she was recently hospitalized approximately 2 months ago for Klebsiella UTI.  ED course: Upon arrival to the ED she was noted to be alert, but agitated and aggressive.  She denied fever, chills, abdominal pain, cough, chest pain, shortness of breath, nausea, vomiting, diarrhea. Initial vital signs: Afebrile, blood pressure 166/86, pulse 138, respirations 31, O2 saturations 100% on room air EKG: Atrial fibrillation with rate of 142, no acute ST/T wave changes Imaging: Head CT with no acute findings.  Chest x-ray with no acute disease  Work-up revealed sodium 131, glucose 229, BUN 33, creatinine 1.71, albumin 2.6, high-sensitivity troponin 17, WBC 9.8, hemoglobin 11.4, platelets 426.  She was given IV metoprolol without improvement in heart rate, and was subsequently placed on Cardizem drip.  Following initiation of Cardizem her blood pressure dropped to 94/33 of which IV fluids were given with noted improvement in blood pressure.    Hospitalist were contacted to admit for further work-up and treatment of Atrial Fibrillation w/ RVR, Acute Metabolic Encephalopathy, incidental finding of COVID-19 infection, and AKI on CKD Stage III. Cardiology  was consulted.  Hospital course: Due to bed availability issues, patient remains in ED.  On 02/15/2021 she again is hypotensive. Cardizem was changed to Amiodarone infusion. She has received a total of 3.2 L of IV fluid boluses, and blood pressure still remains soft.  PCCM is consulted for further assistance with management of shock potentially requiring vasopressors.  Pertinent  Medical History  Paroxysmal atrial fibrillation not on anticoagulation due to recent GI bleed in March 2022 Hypertension Esophageal ulcer Sick sinus syndrome status post PPM CKD stage III Hypothyroidism Diabetes mellitus type 2  Micro Data:  02/14/2021: SARS-CoV-2 PCR>> positive 02/14/2021: Influenza PCR>> negative 02/15/2021: Urine >> 02/15/2021: Blood culture x2>>  Antimicrobials:  Remdesivir 7/6>>  Significant Hospital Events: Including procedures, antibiotic start and stop dates in addition to other pertinent events   02/14/2021: Presented to ED due to altered mental status, incidentally found to be COVID-positive along with atrial fibrillation with RVR; to be admitted by Hospitalist 02/15/2021: Hypotension, Amiodarone on hold; PCCM consulted for possible vasopressors, and Palliative Care consulted for [place]  Interim History / Subjective:  -Pt presented to ED last night, to be admitted for AMS and A-fib w/ RVR, incidentally found to have COVID -Became hypotensive this morning ~receiving IV fluid resuscitation -PCCM consulted for need for possible vasopressors -Palliative Care consulted -Afebrile, BP improved following latest 500 cc bolus -On room air -Pt denies chest pain, palpitations, SOB, cough, abdominal pain, N/V/D  Objective   Blood pressure (!) 78/67, pulse (!) 110, temperature 98 F (36.7 C), temperature source Oral, resp. rate (!) 28, SpO2 99 %.        Intake/Output Summary (Last 24 hours) at 02/15/2021 1149 Last data  filed at 02/15/2021 0537 Gross per 24 hour  Intake 1250 ml  Output --  Net 1250 ml    There were no vitals filed for this visit.  Examination: General: Acute on chronically ill-appearing female, sitting in bed, on room air, confused, no acute distress HENT: Atraumatic, normocephalic, neck supple, no JVD Lungs: Diminished breath sounds bilaterally, even, nonlabored Cardiovascular: Tachycardia, irregular irregular rhythm (atrial fibrillation on telemetry), no murmurs, rubs, gallops Abdomen: Obese, soft, nontender, nondistended, no guarding rebound tenderness Extremities: Normal bulk and tone, no deformities, no edema Neuro: Awake and alert, oriented only to self, moves all extremities to command, speech clear, pupils PERRLA GU: External catheter in place draining dark yellow urine Skin: Limited exam-warm and dry.  Multiple skin tears to bilateral arms.  Resolved Hospital Problem list     Assessment & Plan:   Atrial Fibrillation w/ RVR Hypotension, Multifactorial in etiology (Septic from COVID +/- Hypovolemic +/- Cardiogenic) PMHx of PAF not on anticoagulation due to GI bleed in March 2022, Sick sinus syndrome s/p PPM -Continuous cardiac monitoring -Maintain MAP >65 -Received IV fluid resuscitation in ED -Gentle maintenance IV fluids (NS @ 50 ml/hr) -Vasopressors as needed to maintain MAP goal (will use Neo-synephrine due to A-fib) -Add Midodrine -Trend lactic acid until normalized -Trend HS Troponin until peaked ( 17 ~ 14) -Amiodarone and Cardizem currently on hold due to hypotension -Cardiology following, appreciate input -Echocardiogram pending (previous ECHO from 11/27/20 with LVEF 55-60 %, indeterminate diastolic parameters, RV systolic function normal)  Sepsis due to COVID-19 Infection (meets SIRS Criteria: HR >90, RR >20) PMHx of recent Klebsiella UTI 2 months ago -Monitor fever curve -Trend WBC's & Procalcitonin -Follow cultures as above -Continue Remdesivir (plan for 3 days as pt not hypoxic) -Vitamin C & Zinc -Urinalysis and Urine culture as currently  pending  AKI on CKD Stage III -Monitor I&O's / urinary output -Follow BMP -Ensure adequate renal perfusion -Avoid nephrotoxic agents as able -Replace electrolytes as indicated -IV fluids  Acute Metabolic Encephalopathy, suspect due to COVID +/- AKI -Provide supportive care -Promote normal sleep/wake cycle -Avoid sedating meds as able -CT Head 02/14/21 negative  Diabetes Mellitus Type II -CBG's -SSI -Follow ICU Hypo/Hyperglycemia protocol.   Pt's prognosis is guarded, high risk for cardiac arrest and death.  Pt is DNR.  Palliative Care consulted for further assistance with GOC.   Best Practice (right click and "Reselect all SmartList Selections" daily)   Diet/type: Regular consistency (see orders) DVT prophylaxis: SCD GI prophylaxis: PPI Lines: N/A Foley:  N/A Code Status:  DNR Last date of multidisciplinary goals of care discussion [N/A]  Dr. Mortimer Fries updated pt's daughter via telephone 02/15/21.  Labs   CBC: Recent Labs  Lab 02/14/21 2013  WBC 9.8  NEUTROABS 6.6  HGB 11.4*  HCT 34.0*  MCV 92.4  PLT 426*    Basic Metabolic Panel: Recent Labs  Lab 02/14/21 2013  NA 131*  K 4.4  CL 96*  CO2 25  GLUCOSE 229*  BUN 33*  CREATININE 1.71*  CALCIUM 8.7*   GFR: CrCl cannot be calculated (Unknown ideal weight.). Recent Labs  Lab 02/14/21 2013  WBC 9.8    Liver Function Tests: Recent Labs  Lab 02/14/21 2013  AST 29  ALT 14  ALKPHOS 94  BILITOT 0.7  PROT 6.3*  ALBUMIN 2.6*   No results for input(s): LIPASE, AMYLASE in the last 168 hours. No results for input(s): AMMONIA in the last 168 hours.  ABG No results found for: PHART, PCO2ART, PO2ART,  HCO3, TCO2, ACIDBASEDEF, O2SAT   Coagulation Profile: No results for input(s): INR, PROTIME in the last 168 hours.  Cardiac Enzymes: No results for input(s): CKTOTAL, CKMB, CKMBINDEX, TROPONINI in the last 168 hours.  HbA1C: Hemoglobin A1C  Date/Time Value Ref Range Status  02/03/2014 11:49 AM 7.5 (H)  4.2 - 6.3 % Final    Comment:    The American Diabetes Association recommends that a primary goal of therapy should be <7% and that physicians should reevaluate the treatment regimen in patients with HbA1c values consistently >8%.   07/05/2012 04:47 AM 7.4 (H) 4.2 - 6.3 % Final    Comment:    The American Diabetes Association recommends that a primary goal of therapy should be <7% and that physicians should reevaluate the treatment regimen in patients with HbA1c values consistently >8%.    Hgb A1c MFr Bld  Date/Time Value Ref Range Status  10/17/2020 07:13 PM 5.4 4.8 - 5.6 % Final    Comment:    (NOTE) Pre diabetes:          5.7%-6.4%  Diabetes:              >6.4%  Glycemic control for   <7.0% adults with diabetes   06/27/2016 04:39 AM 6.7 (H) 4.8 - 5.6 % Final    Comment:    (NOTE)         Pre-diabetes: 5.7 - 6.4         Diabetes: >6.4         Glycemic control for adults with diabetes: <7.0     CBG: Recent Labs  Lab 02/15/21 0757  GLUCAP 195*    Review of Systems:   Unable to assess due to AMS   Past Medical History:  She,  has a past medical history of Breast cancer, left ([place]) (2013), CAD (coronary artery disease), Diabetes mellitus without complication ([place]), GERD (gastroesophageal reflux disease), HLD (hyperlipidemia), Hypertension, Hypothyroidism, and Paroxysmal atrial fibrillation ([place]).   Surgical History:   Past Surgical History:  Procedure Laterality Date   BREAST LUMPECTOMY     CARDIAC CATHETERIZATION     CHOLECYSTECTOMY     ESOPHAGOGASTRODUODENOSCOPY (EGD) WITH PROPOFOL N/A 10/20/2020   Procedure: ESOPHAGOGASTRODUODENOSCOPY (EGD) WITH PROPOFOL;  Surgeon: Lin Landsman, MD;  Location: [facility] ENDOSCOPY;  Service: Gastroenterology;  Laterality: N/A;   MASTECTOMY Left    PACEMAKER INSERTION Left 04/21/2019   Procedure: Pacemaker Change Out;  Surgeon: Cletis Athens, MD;  Location: [facility] ORS;  Service: Cardiovascular;  Laterality: Left;   STENT  PLACEMENT VASCULAR ([place] HX)       Social History:   reports that she has never smoked. She has never used smokeless tobacco. She reports that she does not drink alcohol and does not use drugs.   Family History:  Her family history includes Kidney failure in her mother; Lung cancer in her father; Throat cancer in her son.   Allergies No Known Allergies   Home Medications  Prior to Admission medications   Medication Sig Start Date End Date Taking? Authorizing Provider  acetaminophen (TYLENOL) 325 MG tablet Take 650 mg by mouth at bedtime.   Yes [provider]  acetaminophen (TYLENOL) 500 MG tablet Take 500 mg by mouth every 6 (six) hours as needed for mild pain.   Yes [provider]  aspirin EC 81 MG tablet Take 81 mg by mouth daily.   Yes [provider]  bumetanide (BUMEX) 2 MG tablet Hold until followup with outpatient doctor due to low  blood pressure and acute kidney injury. Patient taking differently: Take 2 mg by mouth daily. 10/27/20  Yes Enzo Bi, MD  fluconazole (DIFLUCAN) 150 MG tablet Take 150 mg by mouth every Thursday.   Yes [provider]  fluticasone (FLONASE) 50 MCG/ACT nasal spray Place 2 sprays into both nostrils daily as needed for allergies.   Yes [provider]  insulin aspart (NOVOLOG) 100 UNIT/ML injection Inject 3 Units into the skin 3 (three) times daily with meals. Patient taking differently: Inject 0-12 Units into the skin See admin instructions. Inject under the skin with meals according to sliding scale 150< 0u 151-200: 2u 201-250: 4u 251-300: 6u 301-350: 8u 351-400:10u 401-450: 12u 10/27/20  Yes Enzo Bi, MD  insulin glargine (LANTUS) 100 UNIT/ML injection Inject 0.13 mLs (13 Units total) into the skin at bedtime. 10/27/20  Yes Enzo Bi, MD  ketoconazole (NIZORAL) 2 % shampoo Apply 1 application topically once a week.   Yes [provider]  levothyroxine (SYNTHROID) 75 MCG tablet Take 75 mcg by  mouth daily. 07/22/19  Yes [provider]  lidocaine (LIDODERM) 5 % Place 3 patches onto the skin daily. Patient taking differently: Place 3 patches onto the skin at bedtime. (2 to lower back, 1 to left arm) 10/27/20  Yes Enzo Bi, MD  metoprolol succinate (TOPROL-XL) 50 MG 24 hr tablet Take 1 tablet (50 mg total) by mouth daily. Take with or immediately following a meal. 10/27/20  Yes Enzo Bi, MD  mirtazapine (REMERON) 7.5 MG tablet Take 7.5 mg by mouth at bedtime.   Yes [provider]  oxyCODONE-acetaminophen (PERCOCET/ROXICET) 5-325 MG tablet Take 1 tablet by mouth every 8 (eight) hours as needed for moderate pain.   Yes [provider]  pantoprazole (PROTONIX) 40 MG tablet Take 40 mg by mouth 2 (two) times daily.   Yes [provider]  paricalcitol (ZEMPLAR) 2 MCG capsule Take 2 mcg by mouth every Monday, Wednesday, and Friday.   Yes [provider]  polyethylene glycol (MIRALAX / GLYCOLAX) 17 g packet Take 17 g by mouth daily. 10/28/20  Yes Enzo Bi, MD  potassium chloride SA (KLOR-CON) 20 MEQ tablet Take 40 mEq by mouth daily.   Yes [provider]  ranolazine (RANEXA) 500 MG 12 hr tablet Take 500 mg by mouth 2 (two) times daily.   Yes [provider]  rosuvastatin (CRESTOR) 10 MG tablet Take 10 mg by mouth daily in the afternoon.   Yes [provider]  senna (SENOKOT) 8.6 MG TABS tablet Take 2 tablets by mouth See admin instructions. Take 2 tablets (17.2mg ) by mouth twice daily every other Fluke   Yes [provider]  sucralfate (CARAFATE) 1 GM/10ML suspension Take 1 g by mouth 4 (four) times daily -  with meals and at bedtime.   Yes [provider]  vitamin B-12 1000 MCG tablet Take 1 tablet (1,000 mcg total) by mouth daily. 10/28/20  Yes Enzo Bi, MD     Critical care time: 50 minutes     Darel Hong, AGACNP-BC [facility] Prefer epic messenger for cross cover needs If  after hours, please call E-link

## 2021-02-15 NOTE — ED Notes (Signed)
Messaged attending cardiology provider, Dr Johnny Bridge, per admitting provider's request. Updated him on current vitals and plan to stop amiodarone drip and administer 568ml bolus.

## 2021-02-15 NOTE — ED Notes (Signed)
Dr. Tamala Julian and Damita Dunnings notified of vitals.

## 2021-02-15 NOTE — ED Notes (Addendum)
RN at bedside, see charted vitals. Attending provider messaged, awaiting response. Patient is alert with no changes in mental status from previous assessment.  [phone] - attending provider paged by ED secretary.
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
## 2021-02-16 ENCOUNTER — Inpatient Hospital Stay (HOSPITAL_COMMUNITY)
Admit: 2021-02-16 | Discharge: 2021-02-16 | Disposition: A | Discharge status: Home / Self Care | Payer: Medicare Other | Attending: Pulmonary Disease | Admitting: Pulmonary Disease

## 2021-02-16 DIAGNOSIS — I4891 Unspecified atrial fibrillation: Secondary | ICD-10-CM

## 2021-02-16 DIAGNOSIS — Z7189 Other specified counseling: Secondary | ICD-10-CM | POA: Diagnosis not present

## 2021-02-16 LAB — ECHOCARDIOGRAM COMPLETE
AR max vel: 1.45 cm2
AV Area VTI: 0.94 cm2
AV Area mean vel: 1.24 cm2
AV Mean grad: 3 mmHg
AV Peak grad: 4.6 mmHg
Ao pk vel: 1.07 m/s
Area-P 1/2: 6.54 cm2
MV VTI: 1.97 cm2
S' Lateral: 3.7 cm

## 2021-02-16 MED ORDER — MORPHINE SULFATE (PF) 2 MG/ML IV SOLN: 1.0000 mg | INTRAVENOUS | Status: DC | PRN

## 2021-02-16 MED ORDER — MORPHINE SULFATE (PF) 2 MG/ML IV SOLN
1.0000 mg | INTRAVENOUS | Status: DC | PRN
  Administered 2021-02-17 – 2021-02-21 (×12): 2 mg via INTRAVENOUS
  Filled 2021-02-16 (×14): qty 1

## 2021-02-16 NOTE — Plan of Care (Signed)
  Problem: Education: Goal: Knowledge of General Education information will improve Description: Including pain rating scale, medication(s)/side effects and non-pharmacologic comfort measures Outcome: Progressing   Problem: Health Behavior/Discharge Planning: Goal: Ability to manage health-related needs will improve Outcome: Progressing   Problem: Clinical Measurements: Goal: Will remain free from infection Outcome: Progressing Goal: Diagnostic test results will improve Outcome: Progressing Goal: Respiratory complications will improve Outcome: Progressing Goal: Cardiovascular complication will be avoided Outcome: Progressing   Problem: Activity: Goal: Risk for activity intolerance will decrease Outcome: Progressing   Problem: Coping: Goal: Level of anxiety will decrease Outcome: Progressing   Problem: Elimination: Goal: Will not experience complications related to bowel motility Outcome: Progressing Goal: Will not experience complications related to urinary retention Outcome: Progressing   Problem: Pain Managment: Goal: General experience of comfort will improve Outcome: Progressing   Problem: Safety: Goal: Ability to remain free from injury will improve Outcome: Progressing   Problem: Clinical Measurements: Goal: Ability to maintain clinical measurements within normal limits will improve Outcome: Not Progressing   Problem: Nutrition: Goal: Adequate nutrition will be maintained Outcome: Not Progressing   Problem: Skin Integrity: Goal: Risk for impaired skin integrity will decrease Outcome: Not Progressing  Pt is comfort care.

## 2021-02-16 NOTE — TOC Initial Note (Signed)
Transition of Care [facility]) - Initial/Assessment Note    Patient Details  Name: Regina [name] MRN: [id number] Date of Birth: 05/21/1934  Transition of Care [facility]) CM/SW Contact:    Shelbie Hutching, RN Phone Number: 02/16/2021, 2:22 PM  Clinical Narrative:                 Patient admitted to the hospital for comfort measures.  Patient came in from [facility] where she lived.  RNCM spoke with patient's daughter over the phone, daughter, [name] would like for the patient to go to the [facility] facility at discharge.  She understands that the patient will need to be off isolation before she can go to the hospice home.  Olivia Mackie with [facility] given hospice referral.  Family does not want patient to return to [facility] or go to another hospice facility.    Expected Discharge Plan: [place] Barriers to Discharge: Hospice Bed not available, Continued Medical Work up   Patient Goals and CMS Choice Patient states their goals for this hospitalization and ongoing recovery are:: Family wants patient to discharge to hospice home in [place] once COVID isolation is over CMS Medicare.gov Compare Post Acute Care list provided to:: Patient Represenative (must comment) Choice offered to / list presented to : Adult Children  Expected Discharge Plan and Services Expected Discharge Plan: [place]   Discharge Planning Services: CM Consult Post Acute Care Choice: Hospice Living arrangements for the past 2 months: Skilled Nursing Facility                 DME Arranged: N/A DME Agency: NA       HH Arranged: NA HH Agency: NA        Prior Living Arrangements/Services Living arrangements for the past 2 months: [place] Lives with:: Facility Resident Patient language and need for interpreter reviewed:: Yes Do you feel safe going back to the place where you live?: Yes      Need for Family Participation in Patient  Care: Yes (Comment) Care giver support system in place?: Yes (comment) (daughter)   Criminal Activity/Legal Involvement Pertinent to Current Situation/Hospitalization: No - Comment as needed  Activities of Daily Living      Permission Sought/Granted Permission sought to share information with : Case Manager, Family Supports, Customer service manager Permission granted to share information with : Yes, Verbal Permission Granted  Share Information with NAME: [name]  Permission granted to share info w AGENCY: [place] granted to share info w Relationship: daughter     Emotional Assessment       Orientation: : Oriented to Self Alcohol / Substance Use: Not Applicable Psych Involvement: No (comment)  Admission diagnosis:  Rapid atrial fibrillation ([place]) [I48.91] Atrial fibrillation with RVR ([place]) [I48.91] Patient Active Problem List   Diagnosis Date Noted   COVID-19 virus infection    Rapid atrial fibrillation ([place]) 02/14/2021   Acute kidney injury superimposed on CKD lll ([place]) 02/14/2021   History of GI bleed 02/14/2021   Hypotension 11/23/2020   Generalized weakness 11/22/2020   CKD (chronic kidney disease), stage III ([place]) 11/22/2020   Hyperlipidemia associated with type 2 diabetes mellitus ([place]) 11/22/2020   CAD (coronary artery disease)    Hypothyroidism    Acute upper GI bleeding 10/17/2020   Pacemaker 12/16/2019   History of cancer of left breast 12/16/2019   Sick sinus syndrome due to SA node dysfunction ([place]) 12/16/2019   Pressure injury  of skin 09/26/2016   C. difficile colitis 09/25/2016   Sepsis ([place]) 08/28/2016   Cellulitis 06/26/2016   Acute lower UTI 02/20/2015   Elevated troponin 02/20/2015   Hypoglycemia 02/20/2015   Insulin dependent type 2 diabetes mellitus ([place]) 02/20/2015   Hypertension associated with diabetes ([place]) 02/20/2015   HLD (hyperlipidemia) 02/20/2015   Paroxysmal atrial fibrillation ([place]) 02/20/2015   GERD  (gastroesophageal reflux disease) 02/20/2015   PCP:  System, Provider Not In Pharmacy:   [id number] [place], [place] - [address] [place] [address] Phone: [phone] Fax: [phone]  [facility]  ([place]) - [place], [address] [place] [address] Phone: [phone] Fax: [phone]  [facility] [place], [place] - [address] [place] [address] Phone: [phone] Fax: [phone]     Social Determinants of Health (SDOH) Interventions    Readmission Risk Interventions No flowsheet data found.

## 2021-02-16 NOTE — Progress Notes (Signed)
PROGRESS NOTE    Regina [name]  WIO:[id number] DOB: Sep 28, 1933 DOA: 02/14/2021 PCP: System, Provider Not In   Chief Complaint  Patient presents with   Altered Mental Status    Brief Narrative: 87 year old female with history of CAD with PCI 2009, PAF not on anticoagulation due to history of GI bleed in March 2022 from esophageal ulcer, sick sinus syndrome with pacemaker in place, hypothyroidism, CKD stage III, ID T2DM, hypothyroidism, hypertension sent to the ED for altered mental status and psychiatric evaluation from the nursing facility. As per the report- patient was hospitalized 2 months prior for Klebsiella UTI.  On arrival she was alert .  She denied fever or chills or abdominal pain.  Denied cough or chest pain, nausea or vomiting or diarrhea.  Denied shortness of breath ED Course: On arrival, she was afebrile, BP initially 166/86 with pulse 138, respirations 31 with O2 sat 100% on room air.  She was noted to be in rapid A. fib on EKG.  Blood work mostly unremarkable with hemoglobin 11.4, WBC 9800, creatinine 1.71, up from baseline of 1.16. EKG as reviewed by me : A. fib with rate of 142 with no acute ST-T wave changes Imaging: Head CT with no acute findings.  Chest x-ray with no acute disease Patient tested positive for COVID. Patient was given IV metoprolol without improvement in the heart rate and subsequently Cardizem was given blood pressure was soft 94/33 with infusion.  And patient was admitted overnight. Patient has been placed on amiodarone drip cardiology was consulted overnight. Patient has had persistent soft blood pressure overnight. Patient continued to have soft blood pressure was given aggressive IV fluid resuscitation.  Due to persistent hypotension ICU was consulted, who advised discussed with the daughter and advised palliative care consultation.  Patient was seen by cardiology. Patient UA came back abnormal with pyuria , nitrite large LE possible UTI , antibiotic was  ordered.  She had elevated lactic acid 2..6 then patient subsequently had palliative care meeting with the daughter and based on patient's wishes transitioned to comfort measures w/ no further repeat labs.  Subjective:  Overnight patient screaming, has received meds to relax. This morning barely opens eyes. Placed on oxygen 2 L for comfort this morning.   Assessment & Plan:  Comfort measures/end-of-life: Patient with complex medical comorbidities as listed below along with acute renal failure dehydration, lactic acidosis A. fib with RVR, persistent hypotension, AKI, COVID positive-seen by [facility] cardiology and palliative care, after meeting transition to comfort measures.  Continue plan as per palliative care to focus on comfort.  A. fib with RVR heart rate in 140s in the ED: Initially on Cardizem but hypotensive.  Was on amiodarone, but subsequently discontinued for comfort.  Seen by cardiology.Not on long-term anticoagulation due to history of GI bleeding from esophageal ulcer in March/2022  Hypotension, persistent suspect multifactorial with A. fib RVR, and meds, possible sepsis POA w/ UTI. .  Troponins negative x2.  Acute encephalopathy: Patient has been confused for past week.  Baseline bedbound.CT head on admission no acute finding history of UTI 2 months ago.  COVID-positive in PCR: was placed on remdesivir - now discontinued for comfort.Continue isolation x10 days.   AKI on CKD stage IIIb: Baseline creatinine around 1.2, s/p iv hydration. Recent Labs  Lab 02/14/21 2013 02/15/21 1207  BUN 33* 29*  CREATININE 1.71* 1.42*   Insulin dependent type 2 diabetes mellitus: Blood sugar fairly stable. Recent Labs  Lab 02/15/21 0757 02/15/21 1157  GLUCAP 195*  94   SSS with PM in place CAD at  home on Plavix aspirin Toprol statin Hypothyroidism History of GI bleed from esophageal ulcer in the past Anemia likely from chronic disease with history of GI bleed  Diet Order              Diet regular Room service appropriate? Yes; Fluid consistency: Thin  Diet effective now                   Patient's There is no height or weight on file to calculate BMI.  DVT prophylaxis:  Code Status:   Code Status: DNR  Family Communication: Discussed with nursing staff,. Updated daughter  Status is: Inpatient.  Remains inpatient appropriate because:IV treatments appropriate due to intensity of illness or inability to take PO and Inpatient level of care appropriate due to severity of illness  Dispo: The patient is from: SNF              Anticipated d/c is to: hospice facility              Patient currently is not medically stable to d/c.   Difficult to place patient No  Unresulted Labs (From admission, onward)     Start     Ordered   02/15/21 1045  Urine Culture  Once,   STAT        02/15/21 1044            Medications reviewed:   Consultants:see note  Procedures:see note  Antimicrobials: Anti-infectives (From admission, onward)    Start     Dose/Rate Route Frequency Ordered Stop   02/15/21 1400  cefTRIAXone (ROCEPHIN) 1 g in sodium chloride 0.9 % 100 mL IVPB  Status:  Discontinued        1 g 200 mL/hr over 30 Minutes Intravenous Every 24 hours 02/15/21 1345 02/15/21 1441   02/15/21 1000  remdesivir 100 mg in sodium chloride 0.9 % 100 mL IVPB  Status:  Discontinued       See Hyperspace for full Linked Orders Report.   100 mg 200 mL/hr over 30 Minutes Intravenous Daily 02/15/21 0029 02/15/21 1445   02/15/21 0030  remdesivir 200 mg in sodium chloride 0.9% 250 mL IVPB       See Hyperspace for full Linked Orders Report.   200 mg 580 mL/hr over 30 Minutes Intravenous Once 02/15/21 0029 02/15/21 0339      Culture/Microbiology    Component Value Date/Time   SDES  11/22/2020 1840    IN/OUT CATH URINE Performed at [facility], [address] [place], [place] [address]    [facility]  11/22/2020 1840    NONE Performed at [facility],  [address] [place], [place] [address]    CULT >=100,000 COLONIES/mL KLEBSIELLA PNEUMONIAE (A) 11/22/2020 1840   REPTSTATUS 11/25/2020 FINAL 11/22/2020 1840    Other culture-see note  Objective: Vitals: Today's Vitals   02/15/21 1915 02/15/21 2018 02/15/21 2106 02/16/21 0620  BP:  (!) 121/94 97/73 (!) 91/51  Pulse: (!) 110 (!) 108 (!) 119 (!) 130  Resp:  14 18 16   Temp:   97.9 F (36.6 C) 97.6 F (36.4 C)  TempSrc:    Oral  SpO2: 100% 100% 99% (!) 82%  PainSc:  Asleep      Intake/Output Summary (Last 24 hours) at 02/16/2021 0717 Last data filed at 02/16/2021 0981 Gross per 24 hour  Intake 1256.06 ml  Output 600 ml  Net 656.06 ml  There were no vitals filed for this visit. Weight change:   Intake/Output from previous [place]: 07/07 0701 - 07/08 0700 In: 1256.1 [I.V.:256.1; IV Piggyback:1000] Out: 600 [Urine:600] Intake/Output this shift: No intake/output data recorded. There were no vitals filed for this visit.  Examination: Briefly wakes up on verbal command. Full Exam deferred for comfort.  Data Reviewed: I have personally reviewed following labs and imaging studies CBC: Recent Labs  Lab 02/14/21 2013 02/15/21 1207  WBC 9.8 9.4  NEUTROABS 6.6  --   HGB 11.4* 9.1*  HCT 34.0* 27.9*  MCV 92.4 93.9  PLT 426* 315    Basic Metabolic Panel: Recent Labs  Lab 02/14/21 2013 02/15/21 1207  NA 131* 137  K 4.4 3.4*  CL 96* 106  CO2 25 25  GLUCOSE 229* 85  BUN 33* 29*  CREATININE 1.71* 1.42*  CALCIUM 8.7* 7.8*    GFR: CrCl cannot be calculated (Unknown ideal weight.). Liver Function Tests: Recent Labs  Lab 02/14/21 2013 02/15/21 1207  AST 29 29  ALT 14 13  ALKPHOS 94 64  BILITOT 0.7 0.3  PROT 6.3* 4.8*  ALBUMIN 2.6* 2.0*    No results for input(s): LIPASE, AMYLASE in the last 168 hours. No results for input(s): AMMONIA in the last 168 hours. Coagulation Profile: No results for input(s): INR, PROTIME in the last 168 hours. Cardiac  Enzymes: No results for input(s): CKTOTAL, CKMB, CKMBINDEX, TROPONINI in the last 168 hours. BNP (last 3 results) No results for input(s): PROBNP in the last 8760 hours. HbA1C: Recent Labs    02/15/21 1207  HGBA1C 7.6*   CBG: Recent Labs  Lab 02/15/21 0757 02/15/21 1157  GLUCAP 195* 94    Lipid Profile: No results for input(s): CHOL, HDL, LDLCALC, TRIG, CHOLHDL, LDLDIRECT in the last 72 hours. Thyroid Function Tests: No results for input(s): TSH, T4TOTAL, FREET4, T3FREE, THYROIDAB in the last 72 hours. Anemia Panel: No results for input(s): VITAMINB12, FOLATE, FERRITIN, TIBC, IRON, RETICCTPCT in the last 72 hours. Sepsis Labs: Recent Labs  Lab 02/15/21 1207  PROCALCITON <0.10  LATICACIDVEN 2.6*    Recent Results (from the past 240 hour(s))  Resp Panel by RT-PCR (Flu A&B, Covid) Nasopharyngeal Swab     Status: Abnormal   Collection Time: 02/14/21 11:00 PM   Specimen: Nasopharyngeal Swab; Nasopharyngeal(NP) swabs in vial transport medium  Result Value Ref Range Status   SARS Coronavirus 2 by RT PCR POSITIVE (A) NEGATIVE Final    Comment: RESULT CALLED TO, READ BACK BY AND VERIFIED WITH: JOSHUA MOTES@0018  02/15/21 RH (NOTE) SARS-CoV-2 target nucleic acids are DETECTED.  The SARS-CoV-2 RNA is generally detectable in upper respiratory specimens during the acute phase of infection. Positive results are indicative of the presence of the identified virus, but do not rule out bacterial infection or co-infection with other pathogens not detected by the test. Clinical correlation with patient history and other diagnostic information is necessary to determine patient infection status. The expected result is Negative.  Fact Sheet for Patients: EntrepreneurPulse.com.au  Fact Sheet for Healthcare Providers: IncredibleEmployment.be  This test is not yet approved or cleared by the [place] FDA and  has been authorized for detection and/or  diagnosis of SARS-CoV-2 by FDA under an Emergency Use Authorization (EUA).  This EUA will remain in effect (meaning this test can be used)  for the duration of  the COVID-19 declaration under Section 564(b)(1) of the Act, 21 U.S.C. section 360bbb-3(b)(1), unless the authorization is terminated or revoked sooner.  Influenza A by PCR NEGATIVE NEGATIVE Final   Influenza B by PCR NEGATIVE NEGATIVE Final    Comment: (NOTE) The Xpert Xpress SARS-CoV-2/FLU/RSV plus assay is intended as an aid in the diagnosis of influenza from Nasopharyngeal swab specimens and should not be used as a sole basis for treatment. Nasal washings and aspirates are unacceptable for Xpert Xpress SARS-CoV-2/FLU/RSV testing.  Fact Sheet for Patients: EntrepreneurPulse.com.au  Fact Sheet for Healthcare Providers: IncredibleEmployment.be  This test is not yet approved or cleared by the [place] FDA and has been authorized for detection and/or diagnosis of SARS-CoV-2 by FDA under an Emergency Use Authorization (EUA). This EUA will remain in effect (meaning this test can be used) for the duration of the COVID-19 declaration under Section 564(b)(1) of the Act, 21 U.S.C. section 360bbb-3(b)(1), unless the authorization is terminated or revoked.  Performed at [facility], [address] [place], [place] [address]       Radiology Studies: CT Head Wo Contrast  Result Date: 02/14/2021 CLINICAL DATA:  Mental status change.  Unknown cause. EXAM: CT HEAD WITHOUT CONTRAST TECHNIQUE: Contiguous axial images were obtained from the base of the skull through the vertex without intravenous contrast. COMPARISON:  CT head 10/23/2008 FINDINGS: Brain: Similar-appearing right frontoparietotemporal chronic infarction. Patchy and confluent areas of decreased attenuation are noted throughout the deep and periventricular white matter of the cerebral hemispheres bilaterally, compatible  with chronic microvascular ischemic disease. No evidence of large-territorial acute infarction. No parenchymal hemorrhage. No mass lesion. No extra-axial collection. No mass effect or midline shift. No hydrocephalus. Basilar cisterns are patent. Vascular: No hyperdense vessel. Atherosclerotic calcifications are present within the cavernous internal carotid and vertebral arteries. Skull: No acute fracture or focal lesion. Sinuses/Orbits: Paranasal sinuses and mastoid air cells are clear. Bilateral lens replacement. Otherwise the orbits are unremarkable. Other: Trace right occipital scalp subcutaneus soft tissue edema. IMPRESSION: No acute intracranial abnormality. Electronically Signed   By: Iven Finn M.D.   On: 02/14/2021 22:42   DG Chest Portable 1 View  Result Date: 02/14/2021 CLINICAL DATA:  Altered mental status EXAM: PORTABLE CHEST 1 VIEW COMPARISON:  11/22/2020 FINDINGS: Left-sided pacing device with leads over right atrium and right ventricle. No focal opacity or pleural effusion. Normal cardiomediastinal silhouette with aortic atherosclerosis. No pneumothorax. IMPRESSION: No active disease. Electronically Signed   By: Donavan Foil M.D.   On: 02/14/2021 20:31   [place] EKG SITE RITE  Result Date: 02/15/2021 If Site Rite image not attached, placement could not be confirmed due to current cardiac rhythm.    LOS: 2 days   Antonieta Pert, MD [facility]  02/16/2021, 7:17 AM

## 2021-02-16 NOTE — Care Management Important Message (Signed)
Important Message  Patient Details  Name: Regina [name] MRN: [id number] Date of Birth: 02/18/1934   Medicare Important Message Given:  N/A - LOS <3 / Initial given by admissions     Juliann Pulse A Blayre Papania 02/16/2021, 8:07 AM

## 2021-02-16 NOTE — Progress Notes (Signed)
[facility]) Hospital Liaison note:  This patient is currently enrolled in [facility] outpatient-based Palliative Care. Will continue to follow for disposition  Please call with any outpatient palliative questions or concerns.  Thank you, Lorelee Market, LPN [facility] Liaison [phone]

## 2021-02-16 NOTE — Progress Notes (Signed)
Daily Progress Note   Patient Name: Regina [name]       Date: 02/16/2021 DOB: 07-Aug-1934  Age: 87 y.o. MRN#: [id number] Attending Physician: Antonieta Pert, MD Primary Care Physician: System, Provider Not In Admit Date: 02/14/2021  Reason for Consultation/Follow-up: Establishing goals of care  Subjective: Patient is on covid isolation. Per staff she is not eating or drinking. She is agitated and confused. Spoke with daughter and discussed her status and options. Discussed D/C with hospice. Advised that she is appropriate for hospice facility if she chooses or to a long term care facility with hospice.   Length of Stay: 2     PRN Meds: acetaminophen **OR** acetaminophen, antiseptic oral rinse, glycopyrrolate **OR** glycopyrrolate **OR** glycopyrrolate, haloperidol **OR** haloperidol **OR** haloperidol lactate, LORazepam **OR** LORazepam **OR** LORazepam, morphine injection, OLANZapine zydis, ondansetron **OR** ondansetron (ZOFRAN) IV, polyvinyl alcohol  Physical Exam          Vital Signs: BP 106/80 (BP Location: Left Leg)   Pulse (!) 133   Temp 98 F (36.7 C)   Resp 19   SpO2 (!) 84%  SpO2: SpO2: (!) 84 % O2 Device: O2 Device: Nasal Cannula O2 Flow Rate:    Intake/output summary:  Intake/Output Summary (Last 24 hours) at 02/16/2021 1319 Last data filed at 02/16/2021 8938 Gross per 24 hour  Intake 27.4 ml  Output 600 ml  Net -572.6 ml   LBM: Last BM Date:  (unknown) Baseline Weight:   Most recent weight:      Patient Active Problem List   Diagnosis Date Noted   COVID-19 virus infection    Rapid atrial fibrillation (HCC) 02/14/2021   Acute kidney injury superimposed on CKD lll ([place]) 02/14/2021   History of GI bleed 02/14/2021   Hypotension 11/23/2020   Generalized weakness  11/22/2020   CKD (chronic kidney disease), stage III ([place]) 11/22/2020   Hyperlipidemia associated with type 2 diabetes mellitus ([place]) 11/22/2020   CAD (coronary artery disease)    Hypothyroidism    Acute upper GI bleeding 10/17/2020   Pacemaker 12/16/2019   History of cancer of left breast 12/16/2019   Sick sinus syndrome due to SA node dysfunction ([place]) 12/16/2019   Pressure injury of skin 09/26/2016   C. difficile colitis 09/25/2016   Sepsis ([place]) 08/28/2016   Cellulitis 06/26/2016   Acute lower  UTI 02/20/2015   Elevated troponin 02/20/2015   Hypoglycemia 02/20/2015   Insulin dependent type 2 diabetes mellitus ([place]) 02/20/2015   Hypertension associated with diabetes ([place]) 02/20/2015   HLD (hyperlipidemia) 02/20/2015   Paroxysmal atrial fibrillation ([place]) 02/20/2015   GERD (gastroesophageal reflux disease) 02/20/2015    Palliative Care Assessment & Plan    Recommendations/Plan: On comfort care. Continue symptom management as needed.  Recommend either the hospice facility, or hospice to follow at a facility.    Code Status:    Code Status Orders  (From admission, onward)           Start     Ordered   02/15/21 1449  Do not attempt resuscitation (DNR)  Continuous       Question Answer Comment  In the event of cardiac or respiratory ARREST Do not call a "code blue"   In the event of cardiac or respiratory ARREST Do not perform Intubation, CPR, defibrillation or ACLS   In the event of cardiac or respiratory ARREST Use medication by any route, position, wound care, and other measures to relive pain and suffering. May use oxygen, suction and manual treatment of airway obstruction as needed for comfort.   Comments MOST completed through [facility]      02/15/21 1457           Code Status History     Date Active Date Inactive Code Status Order ID Comments User Context   02/15/2021 1110 02/15/2021 1457 DNR [id number]  Antonieta Pert, MD ED   02/15/2021 0000 02/15/2021 1110 Full Code  [id number]  Athena Masse, MD ED   11/22/2020 2052 11/30/2020 2252 DNR [id number]  Lenore Cordia, MD ED   10/17/2020 1854 10/28/2020 2151 Full Code [id number]  Cox, Amy N, DO ED   09/25/2016 0749 09/26/2016 1717 Full Code [id number]  Hillary Bow, MD ED   08/28/2016 1510 09/02/2016 1753 Full Code [id number]  Henreitta Leber, MD Inpatient   06/26/2016 2243 07/01/2016 1645 Full Code [id number]  Lance Coon, MD Inpatient   02/20/2015 0353 02/21/2015 1711 Full Code [id number]  Lance Coon, MD Inpatient       Prognosis:  < 2 weeks   Thank you for allowing the Palliative Medicine Team to assist in the care of this patient.       Total Time 25 min Prolonged Time Billed  no       Greater than 50%  of this time was spent counseling and coordinating care related to the above assessment and plan.  Asencion Gowda, NP  Please contact Palliative Medicine Team phone at [phone] for questions and concerns.

## 2021-02-16 NOTE — Progress Notes (Signed)
*  PRELIMINARY RESULTS* Echocardiogram 2D Echocardiogram has been performed.  Regina [name] 02/16/2021, 9:32 AM

## 2021-02-16 NOTE — Progress Notes (Signed)
Pt woke up screaming I can't breathe its too hot. Pt given oxygen 2L for comfort and haldol/ativan. Pt resting after pushed. Respiration even unlabored.

## 2021-02-17 DIAGNOSIS — I4891 Unspecified atrial fibrillation: Secondary | ICD-10-CM | POA: Diagnosis not present

## 2021-02-17 MED ORDER — BACITRACIN-NEOMYCIN-POLYMYXIN OINTMENT TUBE
TOPICAL_OINTMENT | CUTANEOUS | Status: DC | PRN
  Filled 2021-02-17: qty 2
  Filled 2021-02-17: qty 14.17

## 2021-02-17 NOTE — Progress Notes (Signed)
PROGRESS NOTE    Regina [name]  FMB:[id number] DOB: [birth year] DOA: 02/14/2021 PCP: System, Provider Not In   Chief Complaint  Patient presents with   Altered Mental Status    Brief Narrative: 87 year old female with history of CAD with PCI 2009, PAF not on anticoagulation due to history of GI bleed in March 2022 from esophageal ulcer, sick sinus syndrome with pacemaker in place, hypothyroidism, CKD stage III, ID T2DM, hypothyroidism, hypertension sent to the ED for altered mental status and psychiatric evaluation from the nursing facility. As per the report- patient was hospitalized 2 months prior for Klebsiella UTI.  On arrival she was alert .  She denied fever or chills or abdominal pain.  Denied cough or chest pain, nausea or vomiting or diarrhea.  Denied shortness of breath In ED on arrival, she was afebrile, BP initially 166/86 with pulse 138, respirations 31 with O2 sat 100% on room air.  She was noted to be in rapid A. fib on EKG.  Blood work mostly unremarkable with hemoglobin 11.4, WBC 9800, creatinine 1.71, up from baseline of 1.16. EKG as reviewed by me : A. fib with rate of 142 with no acute ST-T wave changes Imaging: Head CT with no acute findings.  Chest x-ray with no acute disease Patient tested positive for COVID. Patient was given IV metoprolol without improvement in the heart rate and subsequently Cardizem was given blood pressure was soft 94/33 with infusion.  And patient was admitted overnight. Patient has been placed on amiodarone drip cardiology was consulted overnight. Patient has had persistent soft blood pressure -was given aggressive IV fluid resuscitation.  Due to persistent hypotension ICU was consulted, who discussed with the daughter and advised palliative care consultation.  Patient was seen by cardiology. Patient UA came back abnormal with pyuria , nitrite large LE possible UTI , antibiotic was ordered for possible sepsis. She had elevated lactic acid 2..6- then  patient subsequently had palliative care meeting with the daughter and based on patient's wishes transitioned to comfort measures w/ no further repeat labs.  Subjective: Seen this morning. Her eyes were open and able to tell me her name. Started to scream again.   Assessment & Plan:  Comfort measures/end-of-life: Patient with complex medical comorbidities as listed below along with acute renal failure, dehydration, lactic acidosis A. fib with RVR, persistent hypotension, AKI, COVID positive-seen by [facility] cardiology and palliative care, after meeting w/ daughter transitioned to comfort measures based on patient's wishes. Continue plan as per palliative care to focus on comfort.  Family wants patient to go to hospice home in [place] once isolation is over.  A. fib with RVR  Hx of A FIB-Not on long-term anticoagulation due to history of GI bleeding from esophageal ulcer in March/2022  Hypotension, persistent suspect multifactorial with A. fib RVR, and meds, possible sepsis- W/ ?? UTI-urine cultures so far negative.troponins negative x2.  Acute encephalopathy: Patient has been confused for past week.  Baseline bedbound.CT head on admission no acute finding history of UTI - 2 months ago.  COVID-positive in PCR: was placed on remdesivir - now discontinued for comfort.Continue isolation x10 days.   AKI on CKD stage IIIb: Baseline creatinine around 1.2, s/p iv hydration. Recent Labs  Lab 02/14/21 2013 02/15/21 1207  BUN 33* 29*  CREATININE 1.71* 1.42*   Insulin dependent type 2 diabetes mellitus: Blood sugar fairly stable. Recent Labs  Lab 02/15/21 0757 02/15/21 1157  GLUCAP 195* 94   SSS with PM in place CAD at  home on Plavix aspirin Toprol statin Hypothyroidism History of GI bleed from esophageal ulcer in the past Anemia likely from chronic disease with history of GI bleed  Diet Order             DIET - DYS 1 Room service appropriate? Yes; Fluid consistency: Thin  Diet  effective now                   Patient's There is no height or weight on file to calculate BMI.  DVT prophylaxis:  Code Status:   Code Status: DNR  Family Communication: Discussed with nursing staff,. Updated daughter over the phone.  Status is: Inpatient. Remains inpatient appropriate because:IV treatments appropriate due to intensity of illness or inability to take PO and Inpatient level of care appropriate due to severity of illness  Dispo: The patient is from: SNF              Anticipated d/c is to: hospice facility.  Family prefers hospice home in [place] once isolation is over.  I suspect patient will need to go to a hospice facility or long-term facility with hospice care               Patient currently is not medically stable to d/c.   Difficult to place patient No  Unresulted Labs (From admission, onward)    None       Medications reviewed:   Consultants: Pulmonary critical care  Cardiology  Palliative care   Procedures:see note  Antimicrobials: Anti-infectives (From admission, onward)    Start     Dose/Rate Route Frequency Ordered Stop   02/15/21 1400  cefTRIAXone (ROCEPHIN) 1 g in sodium chloride 0.9 % 100 mL IVPB  Status:  Discontinued        1 g 200 mL/hr over 30 Minutes Intravenous Every 24 hours 02/15/21 1345 02/15/21 1441   02/15/21 1000  remdesivir 100 mg in sodium chloride 0.9 % 100 mL IVPB  Status:  Discontinued       See Hyperspace for full Linked Orders Report.   100 mg 200 mL/hr over 30 Minutes Intravenous Daily 02/15/21 0029 02/15/21 1445   02/15/21 0030  remdesivir 200 mg in sodium chloride 0.9% 250 mL IVPB       See Hyperspace for full Linked Orders Report.   200 mg 580 mL/hr over 30 Minutes Intravenous Once 02/15/21 0029 02/15/21 0339      Culture/Microbiology    Component Value Date/Time   SDES  02/15/2021 1233    URINE, RANDOM Performed at [facility], [address] [place], [place] [address]    [facility]   02/15/2021 1233    NONE Performed at [facility], [address] [place], [place] [address]    CULT  02/15/2021 1233    CULTURE REINCUBATED FOR BETTER GROWTH Performed at [facility] [address], [place], [place] [address]    REPTSTATUS PENDING 02/15/2021 1233    Other culture-see note  Objective: Vitals: Today's Vitals   02/16/21 0620 02/16/21 0900 02/16/21 1234 02/16/21 2255  BP: (!) 91/51  106/80 94/71  Pulse: (!) 130  (!) 133 93  Resp: 16  19 16   Temp: 97.6 F (36.4 C)  98 F (36.7 C) 97.7 F (36.5 C)  TempSrc: Oral     SpO2: (!) 82%  (!) 84% 92%  PainSc:  0-No pain     No intake or output data in the 24 hours ending 02/17/21 0709  There  were no vitals filed for this visit. Weight change:   Intake/Output from previous Polzin: No intake/output data recorded. Intake/Output this shift: No intake/output data recorded. There were no vitals filed for this visit.  Examination: General exam: Alert awake confused, mildly agitated  HEENT:Oral mucosa dry, Ear/Nose WNL grossly, dentition normal. Respiratory system: bilaterally diminished,  no use of accessory muscle Cardiovascular system: S1 & S2 +, No JVD,. Gastrointestinal system: Abdomen soft,ND, BS+ Nervous System:Alert, awake, able to move extremities Extremities: no edema, distal peripheral pulses palpable.  Skin: No rashes,no icterus. MSK: Normal muscle bulk,tone, power   Data Reviewed: I have personally reviewed following labs and imaging studies CBC: Recent Labs  Lab 02/14/21 2013 02/15/21 1207  WBC 9.8 9.4  NEUTROABS 6.6  --   HGB 11.4* 9.1*  HCT 34.0* 27.9*  MCV 92.4 93.9  PLT 426* 161    Basic Metabolic Panel: Recent Labs  Lab 02/14/21 2013 02/15/21 1207  NA 131* 137  K 4.4 3.4*  CL 96* 106  CO2 25 25  GLUCOSE 229* 85  BUN 33* 29*  CREATININE 1.71* 1.42*  CALCIUM 8.7* 7.8*    GFR: CrCl cannot be calculated (Unknown ideal weight.). Liver Function Tests: Recent Labs   Lab 02/14/21 2013 02/15/21 1207  AST 29 29  ALT 14 13  ALKPHOS 94 64  BILITOT 0.7 0.3  PROT 6.3* 4.8*  ALBUMIN 2.6* 2.0*    No results for input(s): LIPASE, AMYLASE in the last 168 hours. No results for input(s): AMMONIA in the last 168 hours. Coagulation Profile: No results for input(s): INR, PROTIME in the last 168 hours. Cardiac Enzymes: No results for input(s): CKTOTAL, CKMB, CKMBINDEX, TROPONINI in the last 168 hours. BNP (last 3 results) No results for input(s): PROBNP in the last 8760 hours. HbA1C: Recent Labs    02/15/21 1207  HGBA1C 7.6*    CBG: Recent Labs  Lab 02/15/21 0757 02/15/21 1157  GLUCAP 195* 94    Lipid Profile: No results for input(s): CHOL, HDL, LDLCALC, TRIG, CHOLHDL, LDLDIRECT in the last 72 hours. Thyroid Function Tests: No results for input(s): TSH, T4TOTAL, FREET4, T3FREE, THYROIDAB in the last 72 hours. Anemia Panel: No results for input(s): VITAMINB12, FOLATE, FERRITIN, TIBC, IRON, RETICCTPCT in the last 72 hours. Sepsis Labs: Recent Labs  Lab 02/15/21 1207  PROCALCITON <0.10  LATICACIDVEN 2.6*     Recent Results (from the past 240 hour(s))  Resp Panel by RT-PCR (Flu A&B, Covid) Nasopharyngeal Swab     Status: Abnormal   Collection Time: 02/14/21 11:00 PM   Specimen: Nasopharyngeal Swab; Nasopharyngeal(NP) swabs in vial transport medium  Result Value Ref Range Status   SARS Coronavirus 2 by RT PCR POSITIVE (A) NEGATIVE Final    Comment: RESULT CALLED TO, READ BACK BY AND VERIFIED WITH: JOSHUA MOTES@[email or web address]  02/15/21 RH (NOTE) SARS-CoV-2 target nucleic acids are DETECTED.  The SARS-CoV-2 RNA is generally detectable in upper respiratory specimens during the acute phase of infection. Positive results are indicative of the presence of the identified virus, but do not rule out bacterial infection or co-infection with other pathogens not detected by the test. Clinical correlation with patient history and other diagnostic information  is necessary to determine patient infection status. The expected result is Negative.  Fact Sheet for Patients: EntrepreneurPulse.com.au  Fact Sheet for Healthcare Providers: IncredibleEmployment.be  This test is not yet approved or cleared by the [place] FDA and  has been authorized for detection and/or diagnosis of SARS-CoV-2 by FDA under an Emergency Use Authorization (  EUA).  This EUA will remain in effect (meaning this test can be used)  for the duration of  the COVID-19 declaration under Section 564(b)(1) of the Act, 21 U.S.C. section 360bbb-3(b)(1), unless the authorization is terminated or revoked sooner.     Influenza A by PCR NEGATIVE NEGATIVE Final   Influenza B by PCR NEGATIVE NEGATIVE Final    Comment: (NOTE) The Xpert Xpress SARS-CoV-2/FLU/RSV plus assay is intended as an aid in the diagnosis of influenza from Nasopharyngeal swab specimens and should not be used as a sole basis for treatment. Nasal washings and aspirates are unacceptable for Xpert Xpress SARS-CoV-2/FLU/RSV testing.  Fact Sheet for Patients: EntrepreneurPulse.com.au  Fact Sheet for Healthcare Providers: IncredibleEmployment.be  This test is not yet approved or cleared by the [place] FDA and has been authorized for detection and/or diagnosis of SARS-CoV-2 by FDA under an Emergency Use Authorization (EUA). This EUA will remain in effect (meaning this test can be used) for the duration of the COVID-19 declaration under Section 564(b)(1) of the Act, 21 U.S.C. section 360bbb-3(b)(1), unless the authorization is terminated or revoked.  Performed at [facility], [address] [place], [place] [address]   Urine Culture     Status: None (Preliminary result)   Collection Time: 02/15/21 12:33 PM   Specimen: Urine, Random  Result Value Ref Range Status   Specimen Description   Final    URINE,  RANDOM Performed at [facility], [address], [place], [place] [address]    Special Requests   Final    NONE Performed at [facility], [address], [place], [place] [address]    Culture   Final    CULTURE REINCUBATED FOR BETTER GROWTH Performed at [facility], [place] [address] [place], [place] [address]    Report Status PENDING  Incomplete      Radiology Studies: ECHOCARDIOGRAM COMPLETE  Result Date: 02/16/2021    ECHOCARDIOGRAM REPORT   Patient Name:   Regina [name] Date of Exam: 02/16/2021 Medical Rec #:  [id number]     Height:       59.0 in Accession #:    [id number]    Weight:       202.3 lb Date of Birth:  05/23/1934     BSA:          1.853 m Patient Age:    57 years      BP:           91/51 mmHg Patient Gender: F             HR:           141 bpm. Exam Location:  [facility] Procedure: 2D Echo, Color Doppler and Cardiac Doppler Indications:     I48.91 Atrial Fibrillation  History:         Patient has prior history of Echocardiogram examinations, most                  recent 11/27/2020. CAD; Risk Factors:Hypertension, Diabetes and                  Dyslipidemia.  Sonographer:     Charmayne Sheer RDCS (AE) Referring Phys:  [id number] Bradly Bienenstock Diagnosing Phys: Nelva Bush MD  Sonographer Comments: Suboptimal apical window and suboptimal subcostal window. Image acquisition challenging due to patient body habitus and Image acquisition challenging due to mastectomy. IMPRESSIONS  1. Left ventricular ejection fraction, by estimation, is >55%. The left ventricle has  normal function. Left ventricular endocardial border not optimally defined to evaluate regional wall motion. Left ventricular diastolic parameters are indeterminate.  2. Right ventricular systolic function is normal. The right ventricular size is normal. Mildly increased right ventricular wall thickness. There is mildly elevated pulmonary artery systolic pressure.  3. Left atrial size was mildly dilated.  4. Right  atrial size was mildly dilated.  5. The mitral valve is abnormal. Trivial mitral valve regurgitation. Moderate mitral annular calcification.  6. Tricuspid valve regurgitation is moderate to severe.  7. The aortic valve has an indeterminant number of cusps. There is mild calcification of the aortic valve. There is mild thickening of the aortic valve. Aortic valve regurgitation is trivial. Mild to moderate aortic valve sclerosis/calcification is present, without any evidence of aortic stenosis.  8. The inferior vena cava is dilated in size with <50% respiratory variability, suggesting right atrial pressure of 15 mmHg. FINDINGS  Left Ventricle: Left ventricular ejection fraction, by estimation, is >55%. The left ventricle has normal function. Left ventricular endocardial border not optimally defined to evaluate regional wall motion. The left ventricular internal cavity size was  normal in size. There is borderline left ventricular hypertrophy. Left ventricular diastolic parameters are indeterminate. Right Ventricle: The right ventricular size is normal. Mildly increased right ventricular wall thickness. Right ventricular systolic function is normal. There is mildly elevated pulmonary artery systolic pressure. The tricuspid regurgitant velocity is 2.38 m/s, and with an assumed right atrial pressure of 15 mmHg, the estimated right ventricular systolic pressure is 87.5 mmHg. Left Atrium: Left atrial size was mildly dilated. Right Atrium: Right atrial size was mildly dilated. Pericardium: There is no evidence of pericardial effusion. Presence of pericardial fat pad. Mitral Valve: The mitral valve is abnormal. There is mild thickening of the mitral valve leaflet(s). Moderate mitral annular calcification. Trivial mitral valve regurgitation. MV peak gradient, 4.4 mmHg. The mean mitral valve gradient is 2.0 mmHg. Tricuspid Valve: The tricuspid valve is grossly normal. Tricuspid valve regurgitation is moderate to severe. Aortic  Valve: The aortic valve has an indeterminant number of cusps. There is mild calcification of the aortic valve. There is mild thickening of the aortic valve. Aortic valve regurgitation is trivial. Mild to moderate aortic valve sclerosis/calcification is present, without any evidence of aortic stenosis. Aortic valve mean gradient measures 3.0 mmHg. Aortic valve peak gradient measures 4.6 mmHg. Aortic valve area, by VTI measures 0.94 cm. Pulmonic Valve: The pulmonic valve was not well visualized. Pulmonic valve regurgitation is not visualized. No evidence of pulmonic stenosis. Aorta: The aortic root is normal in size and structure. Pulmonary Artery: The pulmonary artery is not well seen. Venous: The inferior vena cava is dilated in size with less than 50% respiratory variability, suggesting right atrial pressure of 15 mmHg. IAS/Shunts: The interatrial septum was not well visualized. Additional Comments: A device lead is visualized in the right ventricle.  LEFT VENTRICLE PLAX 2D LVIDd:         5.00 cm  Diastology LVIDs:         3.70 cm  LV e' medial:    3.37 cm/s LV PW:         1.00 cm  LV E/e' medial:  27.9 LV IVS:        0.70 cm  LV e' lateral:   3.37 cm/s LVOT diam:     1.70 cm  LV E/e' lateral: 27.9 LV SV:         18 LV SV Index:   10 LVOT Area:  2.27 cm  LEFT ATRIUM             Index       RIGHT ATRIUM           Index LA diam:        3.80 cm 2.05 cm/m  RA Area:     20.14 cm 10.87 cm/m LA Vol (A2C):   51.6 ml 27.84 ml/m LA Vol (A4C):   59.9 ml 32.32 ml/m LA Biplane Vol: 56.7 ml 30.59 ml/m  AORTIC VALVE                   PULMONIC VALVE AV Area (Vmax):    1.45 cm    PV Vmax:       0.81 m/s AV Area (Vmean):   1.24 cm    PV Vmean:      57.200 cm/s AV Area (VTI):     0.94 cm    PV VTI:        0.142 m AV Vmax:           107.00 cm/s PV Peak grad:  2.6 mmHg AV Vmean:          76.600 cm/s PV Mean grad:  1.0 mmHg AV VTI:            0.192 m AV Peak Grad:      4.6 mmHg AV Mean Grad:      3.0 mmHg LVOT Vmax:          68.50 cm/s LVOT Vmean:        41.800 cm/s LVOT VTI:          0.079 m LVOT/AV VTI ratio: 0.41  AORTA Ao Root diam: 3.20 cm MITRAL VALVE               TRICUSPID VALVE MV Area (PHT): 6.54 cm    TR Peak grad:   22.7 mmHg MV Area VTI:   1.97 cm    TR Vmax:        238.00 cm/s MV Peak grad:  4.4 mmHg MV Mean grad:  2.0 mmHg    SHUNTS MV Vmax:       1.05 m/s    Systemic VTI:  0.08 m MV Vmean:      65.7 cm/s   Systemic Diam: 1.70 cm MV Decel Time: 116 msec MV E velocity: 93.97 cm/s Nelva Bush MD Electronically signed by Nelva Bush MD Signature Date/Time: 02/16/2021/1:44:26 PM    Final    [place] EKG SITE RITE  Result Date: 02/15/2021 If Site Rite image not attached, placement could not be confirmed due to current cardiac rhythm.    LOS: 3 days   Antonieta Pert, MD [facility]  02/17/2021, 7:09 AM

## 2021-02-18 DIAGNOSIS — I4891 Unspecified atrial fibrillation: Secondary | ICD-10-CM | POA: Diagnosis not present

## 2021-02-18 LAB — URINE CULTURE: Culture: >=100000 — AB

## 2021-02-18 LAB — GLUCOSE, CAPILLARY: Glucose-Capillary: 257 mg/dL — ABNORMAL HIGH (ref 70–99)

## 2021-02-18 NOTE — Progress Notes (Signed)
PROGRESS NOTE    Regina [name]  QRF:[id number] DOB: [birth year] DOA: 02/14/2021 PCP: System, Provider Not In   Chief Complaint  Patient presents with   Altered Mental Status    Brief Narrative: 87 year old female with history of CAD with PCI 2009, PAF not on anticoagulation due to history of GI bleed in March 2022 from esophageal ulcer, sick sinus syndrome with pacemaker in place, hypothyroidism, CKD stage III, ID T2DM, hypothyroidism, hypertension sent to the ED for altered mental status and psychiatric evaluation from the nursing facility. As per the report- patient was hospitalized 2 months prior for Klebsiella UTI.  On arrival she was alert .  She denied fever or chills or abdominal pain.  Denied cough or chest pain, nausea or vomiting or diarrhea.  Denied shortness of breath In ED on arrival, she was afebrile, BP initially 166/86 with pulse 138, respirations 31 with O2 sat 100% on room air.  She was noted to be in rapid A. fib on EKG.  Blood work mostly unremarkable with hemoglobin 11.4, WBC 9800, creatinine 1.71, up from baseline of 1.16. EKG as reviewed by me : A. fib with rate of 142 with no acute ST-T wave changes Imaging: Head CT with no acute findings.  Chest x-ray with no acute disease Patient tested positive for COVID. Patient was given IV metoprolol without improvement in the heart rate and subsequently Cardizem was given blood pressure was soft 94/33 with infusion.  And patient was admitted overnight. Patient has been placed on amiodarone drip cardiology was consulted overnight. Patient has had persistent soft blood pressure -was given aggressive IV fluid resuscitation.  Due to persistent hypotension ICU was consulted, who discussed with the daughter and advised palliative care consultation.  Patient was seen by cardiology. Patient UA came back abnormal with pyuria , nitrite large LE possible UTI , antibiotic was ordered for possible sepsis. She had elevated lactic acid 2..6- then  patient subsequently had palliative care meeting with the daughter and based on patient's wishes transitioned to comfort measures w/ no further repeat labs.  Subjective: Seen this morning.  Patient is alert awake wanting to eat, somewhat confused. Tells me she is " supposed to be in the hospital"   Assessment & Plan:  Comfort measures: Patient with multiple medical comorbidities as listed below along with acute renal failure, dehydration, lactic acidosis A. fib with RVR, persistent hypotension, AKI, COVID positive-seen by [facility] cardiology and palliative care, after meeting w/ daughter transitioned to comfort measures based on patient's wishes. Continue plan as per palliative care to focus on comfort.  Family wants patient to go to hospice home in [place] once isolation is over.  Patient is having some p.o. intake alert awake confused at times agitated.   Severe sepsis POA due to UTI-urine culture came back positive with > 100,000 vancomycin-resistant Enterococcus, 20,000 Klebsiella pneumonia ESBL.  Met severe sepsis criteria with hypotension lactic acidosis tachycardia AKI on the in the setting of UTI.  A. fib with RVR  Hx of A FIB-Not on long-term anticoagulation due to history of GI bleeding from esophageal ulcer in March/2022  Hypotension, persistent suspect multifactorial with A. fib RVR, and severe sepsis. Urine cultures so far negative. Troponins negative x2.  Acute encephalopathy: Patient has been confused for past week.  Baseline bedbound.CT head on admission no acute finding history of UTI - 2 months ago.  COVID-positive in PCR: was placed on remdesivir - now discontinued for comfort.Continue isolation x10 days.   AKI on CKD stage IIIb:  Baseline creatinine around 1.2, s/p iv hydration. Recent Labs  Lab 02/14/21 2013 02/15/21 1207  BUN 33* 29*  CREATININE 1.71* 1.42*   Insulin dependent type 2 diabetes mellitus: Blood sugar fairly stable. Recent Labs  Lab 02/15/21 0757  02/15/21 1157 02/18/21 0819  GLUCAP 195* 94 257*   SSS with PM in place CAD at  home on Plavix aspirin Toprol statin Hypothyroidism History of GI bleed from esophageal ulcer in the past Anemia likely from chronic disease with history of GI bleed  Diet Order             DIET - DYS 1 Room service appropriate? Yes; Fluid consistency: Thin  Diet effective now                   Patient's There is no height or weight on file to calculate BMI.  DVT prophylaxis:  Code Status:   Code Status: DNR  Family Communication: Discussed with nursing staff, previously updated daughter regarding plan  Status is: Inpatient. Remains inpatient appropriate because:IV treatments appropriate due to intensity of illness or inability to take PO and Inpatient level of care appropriate due to severity of illness  Dispo: The patient is from: SNF              Anticipated d/c is to: hospice facility.  Family prefers hospice home in [place] once isolation is over.  I suspect patient will need to go to a hospice facility or long-term facility with hospice care.  Avoid for further disposition.              Patient currently is not medically stable to d/c.   Difficult to place patient No  Unresulted Labs (From admission, onward)    None       Medications reviewed:   Consultants: Pulmonary critical care  Cardiology  Palliative care   Procedures:see note  Antimicrobials: Anti-infectives (From admission, onward)    Start     Dose/Rate Route Frequency Ordered Stop   02/15/21 1400  cefTRIAXone (ROCEPHIN) 1 g in sodium chloride 0.9 % 100 mL IVPB  Status:  Discontinued        1 g 200 mL/hr over 30 Minutes Intravenous Every 24 hours 02/15/21 1345 02/15/21 1441   02/15/21 1000  remdesivir 100 mg in sodium chloride 0.9 % 100 mL IVPB  Status:  Discontinued       See Hyperspace for full Linked Orders Report.   100 mg 200 mL/hr over 30 Minutes Intravenous Daily 02/15/21 0029 02/15/21 1445   02/15/21  0030  remdesivir 200 mg in sodium chloride 0.9% 250 mL IVPB       See Hyperspace for full Linked Orders Report.   200 mg 580 mL/hr over 30 Minutes Intravenous Once 02/15/21 0029 02/15/21 0339      Culture/Microbiology    Component Value Date/Time   SDES  02/15/2021 1233    URINE, RANDOM Performed at [facility], [address] [place], [place] [address]    [facility]  02/15/2021 1233    NONE Performed at [facility], [place], [place] [address]    CULT (A) 02/15/2021 1233    >=100,000 COLONIES/mL VANCOMYCIN RESISTANT ENTEROCOCCUS 20,000 COLONIES/mL KLEBSIELLA PNEUMONIAE Confirmed Extended Spectrum Beta-Lactamase Producer (ESBL).  In bloodstream infections from ESBL organisms, carbapenems are preferred over piperacillin/tazobactam. They are shown to have a lower risk of mortality.    REPTSTATUS 02/18/2021 FINAL 02/15/2021 1233    Other culture-see note  Objective: Vitals: Today's  Vitals   02/17/21 2034 02/18/21 0541 02/18/21 0818 02/18/21 0900  BP:  96/65 129/80   Pulse:  72 (!) 149 78  Resp:  14 16   Temp:  98.4 F (36.9 C) 97.6 F (36.4 C)   TempSrc:   Oral   SpO2:  (!) 80%    PainSc: 0-No pain      No intake or output data in the 24 hours ending 02/18/21 1039  There were no vitals filed for this visit. Weight change:   Intake/Output from previous [place]: No intake/output data recorded. Intake/Output this shift: No intake/output data recorded. There were no vitals filed for this visit.  Examination: General exam: Alert awake oriented to self, place, mildly confused, elderly  HEENT:Oral mucosa moist, Ear/Nose WNL grossly, dentition normal. Respiratory system: bilaterally diminished, no use of accessory muscle Cardiovascular system: S1 & S2 +, No JVD,. Gastrointestinal system: Abdomen soft,NT,ND, BS+ Nervous System:Alert, awake Extremities: no edema, distal peripheral pulses palpable.  Skin: No rashes,no icterus. MSK:  Normal muscle bulk,tone, power    Data Reviewed: I have personally reviewed following labs and imaging studies CBC: Recent Labs  Lab 02/14/21 2013 02/15/21 1207  WBC 9.8 9.4  NEUTROABS 6.6  --   HGB 11.4* 9.1*  HCT 34.0* 27.9*  MCV 92.4 93.9  PLT 426* 440    Basic Metabolic Panel: Recent Labs  Lab 02/14/21 2013 02/15/21 1207  NA 131* 137  K 4.4 3.4*  CL 96* 106  CO2 25 25  GLUCOSE 229* 85  BUN 33* 29*  CREATININE 1.71* 1.42*  CALCIUM 8.7* 7.8*    GFR: CrCl cannot be calculated (Unknown ideal weight.). Liver Function Tests: Recent Labs  Lab 02/14/21 2013 02/15/21 1207  AST 29 29  ALT 14 13  ALKPHOS 94 64  BILITOT 0.7 0.3  PROT 6.3* 4.8*  ALBUMIN 2.6* 2.0*    No results for input(s): LIPASE, AMYLASE in the last 168 hours. No results for input(s): AMMONIA in the last 168 hours. Coagulation Profile: No results for input(s): INR, PROTIME in the last 168 hours. Cardiac Enzymes: No results for input(s): CKTOTAL, CKMB, CKMBINDEX, TROPONINI in the last 168 hours. BNP (last 3 results) No results for input(s): PROBNP in the last 8760 hours. HbA1C: Recent Labs    02/15/21 1207  HGBA1C 7.6*    CBG: Recent Labs  Lab 02/15/21 0757 02/15/21 1157 02/18/21 0819  GLUCAP 195* 94 257*    Lipid Profile: No results for input(s): CHOL, HDL, LDLCALC, TRIG, CHOLHDL, LDLDIRECT in the last 72 hours. Thyroid Function Tests: No results for input(s): TSH, T4TOTAL, FREET4, T3FREE, THYROIDAB in the last 72 hours. Anemia Panel: No results for input(s): VITAMINB12, FOLATE, FERRITIN, TIBC, IRON, RETICCTPCT in the last 72 hours. Sepsis Labs: Recent Labs  Lab 02/15/21 1207  PROCALCITON <0.10  LATICACIDVEN 2.6*     Recent Results (from the past 240 hour(s))  Resp Panel by RT-PCR (Flu A&B, Covid) Nasopharyngeal Swab     Status: Abnormal   Collection Time: 02/14/21 11:00 PM   Specimen: Nasopharyngeal Swab; Nasopharyngeal(NP) swabs in vial transport medium  Result Value  Ref Range Status   SARS Coronavirus 2 by RT PCR POSITIVE (A) NEGATIVE Final    Comment: RESULT CALLED TO, READ BACK BY AND VERIFIED WITH: JOSHUA MOTES$RemoveBeforeDEI'@0018'[id number]$  02/15/21 RH (NOTE) SARS-CoV-2 target nucleic acids are DETECTED.  The SARS-CoV-2 RNA is generally detectable in upper respiratory specimens during the acute phase of infection. Positive results are indicative of the presence of the identified virus, but do  not rule out bacterial infection or co-infection with other pathogens not detected by the test. Clinical correlation with patient history and other diagnostic information is necessary to determine patient infection status. The expected result is Negative.  Fact Sheet for Patients: EntrepreneurPulse.com.au  Fact Sheet for Healthcare Providers: IncredibleEmployment.be  This test is not yet approved or cleared by the [place] FDA and  has been authorized for detection and/or diagnosis of SARS-CoV-2 by FDA under an Emergency Use Authorization (EUA).  This EUA will remain in effect (meaning this test can be used)  for the duration of  the COVID-19 declaration under Section 564(b)(1) of the Act, 21 U.S.C. section 360bbb-3(b)(1), unless the authorization is terminated or revoked sooner.     Influenza A by PCR NEGATIVE NEGATIVE Final   Influenza B by PCR NEGATIVE NEGATIVE Final    Comment: (NOTE) The Xpert Xpress SARS-CoV-2/FLU/RSV plus assay is intended as an aid in the diagnosis of influenza from Nasopharyngeal swab specimens and should not be used as a sole basis for treatment. Nasal washings and aspirates are unacceptable for Xpert Xpress SARS-CoV-2/FLU/RSV testing.  Fact Sheet for Patients: EntrepreneurPulse.com.au  Fact Sheet for Healthcare Providers: IncredibleEmployment.be  This test is not yet approved or cleared by the [place] FDA and has been authorized for detection and/or diagnosis  of SARS-CoV-2 by FDA under an Emergency Use Authorization (EUA). This EUA will remain in effect (meaning this test can be used) for the duration of the COVID-19 declaration under Section 564(b)(1) of the Act, 21 U.S.C. section 360bbb-3(b)(1), unless the authorization is terminated or revoked.  Performed at [facility], [address] [place], [place] [address]   Urine Culture     Status: Abnormal   Collection Time: 02/15/21 12:33 PM   Specimen: Urine, Random  Result Value Ref Range Status   Specimen Description   Final    URINE, RANDOM Performed at [facility], [address] [place], [place] [address]    Special Requests   Final    NONE Performed at [facility], [address] [place], [place] [address]    Culture (A)  Final    >=100,000 COLONIES/mL VANCOMYCIN RESISTANT ENTEROCOCCUS 20,000 COLONIES/mL KLEBSIELLA PNEUMONIAE Confirmed Extended Spectrum Beta-Lactamase Producer (ESBL).  In bloodstream infections from ESBL organisms, carbapenems are preferred over piperacillin/tazobactam. They are shown to have a lower risk of mortality.    Report Status 02/18/2021 FINAL  Final   Organism ID, Bacteria KLEBSIELLA PNEUMONIAE (A)  Final   Organism ID, Bacteria VANCOMYCIN RESISTANT ENTEROCOCCUS (A)  Final      Susceptibility   Klebsiella pneumoniae - MIC*    AMPICILLIN >=32 RESISTANT Resistant     CEFAZOLIN >=64 RESISTANT Resistant     CEFEPIME 8 INTERMEDIATE Intermediate     CEFTRIAXONE >=64 RESISTANT Resistant     CIPROFLOXACIN 1 SENSITIVE Sensitive     GENTAMICIN >=16 RESISTANT Resistant     IMIPENEM <=0.25 SENSITIVE Sensitive     NITROFURANTOIN 128 RESISTANT Resistant     TRIMETH/SULFA >=320 RESISTANT Resistant     AMPICILLIN/SULBACTAM >=32 RESISTANT Resistant     PIP/TAZO <=4 SENSITIVE Sensitive     * 20,000 COLONIES/mL KLEBSIELLA PNEUMONIAE   Vancomycin resistant enterococcus - MIC*    AMPICILLIN 16 RESISTANT Resistant     NITROFURANTOIN  128 RESISTANT Resistant     VANCOMYCIN >=32 RESISTANT Resistant     * >=100,000 COLONIES/mL VANCOMYCIN RESISTANT ENTEROCOCCUS      Radiology Studies: No results found.   LOS: 4  days   Antonieta Pert, MD [facility]  02/18/2021, 10:39 AM

## 2021-02-19 DIAGNOSIS — I4891 Unspecified atrial fibrillation: Secondary | ICD-10-CM | POA: Diagnosis not present

## 2021-02-19 NOTE — Care Management Important Message (Signed)
Important Message  Patient Details  Name: Regina [name] MRN: [id number] Date of Birth: 1934/07/19   Medicare Important Message Given:  Other (see comment)  Patient is on comfort care and waiting for bed at the Hospice Home. Out of respect for the patient and family no Important Message from [facility] given.  [name] 02/19/2021, 8:26 AM

## 2021-02-19 NOTE — Progress Notes (Addendum)
[facility]) RN Hospital Liaison Note:  Received request from Doran Clay, RN [facility] manager for family interest in [facility]. Chart and patient information reviewed by [facility] physician. Hospice Home eligibility confirmed.   Spoke with patient's daughter, Mollie Germany to confirm interest and explain services.   Patient is currently under Covid precautions and will not be able to transfer to Hospice Home until Covid precautions lifted on 7.16.22, pending room availability at the [place].   Family and [facility] manager aware hospital liaison will follow up through disposition.   Please do not hesitate to call for any hospice related questions or concerns.   Thank you for the opportunity to participate in this patient's care.   Bobbie "Loren Racer, RN, BSN [facility] Liaison [phone]

## 2021-02-19 NOTE — Progress Notes (Signed)
PROGRESS NOTE    Regina [name]  IRC:[id number] DOB: 25-Mar-1934 DOA: 02/14/2021 PCP: System, Provider Not In   Chief Complaint  Patient presents with   Altered Mental Status    Brief Narrative: 87 year old female with history of CAD with PCI 2009, PAF not on anticoagulation due to history of GI bleed in March 2022 from esophageal ulcer, sick sinus syndrome with pacemaker in place, hypothyroidism, CKD stage III, ID T2DM, hypothyroidism, hypertension sent to the ED for altered mental status and psychiatric evaluation from the nursing facility. As per the report- patient was hospitalized 2 months prior for Klebsiella UTI.  On arrival she was alert .  She denied fever or chills or abdominal pain.  Denied cough or chest pain, nausea or vomiting or diarrhea.  Denied shortness of breath In ED on arrival, she was afebrile, BP initially 166/86 with pulse 138, respirations 31 with O2 sat 100% on room air.  She was noted to be in rapid A. fib on EKG.  Blood work mostly unremarkable with hemoglobin 11.4, WBC 9800, creatinine 1.71, up from baseline of 1.16. EKG as reviewed by me : A. fib with rate of 142 with no acute ST-T wave changes Imaging: Head CT with no acute findings.  Chest x-ray with no acute disease Patient tested positive for COVID. Patient was given IV metoprolol without improvement in the heart rate and subsequently Cardizem was given blood pressure was soft 94/33 with infusion.  And patient was admitted overnight. Patient has been placed on amiodarone drip cardiology was consulted overnight. Patient has had persistent soft blood pressure -was given aggressive IV fluid resuscitation.  Due to persistent hypotension ICU was consulted, who discussed with the daughter and advised palliative care consultation.  Patient was seen by cardiology. Patient UA came back abnormal with pyuria , nitrite large LE possible UTI , antibiotic was ordered for possible sepsis. She had elevated lactic acid 2..6- then  patient subsequently had palliative care meeting with the daughter and based on patient's wishes transitioned to comfort measures w/ no further repeat labs.  Subjective:  Patient is alert Di Kindle knows she is at the hospital. Not screaming and not agitated now.   Assessment & Plan:  Goals of care/Comfort measures:Patient with multiple medical comorbidities as listed below along with acute renal failure, dehydration, lactic acidosis A. fib with RVR, persistent hypotension, AKI, COVID positive-seen by [facility] cardiology and palliative care, after meeting w/ daughter transitioned to comfort measures based on patient's wishes.  Awaiting for disposition likely return to facility with hospice versus hospice facility.   Severe sepsis POA due to UTI-urine culture came back positive with > 100,000 vancomycin-resistant Enterococcus, 20,000 Klebsiella pneumonia ESBL. She met severe sepsis criteria with hypotension lactic acidosis tachycardia AKI on the in the setting of UTI.  A. fib with RVR:Not on long-term anticoagulation due to history of GI bleeding from esophageal ulcer in March/2022.  Hypotension, persistent suspect multifactorial with A. fib RVR, and severe sepsis. Urine cultures so far negative.Troponins negative x2.  Acute encephalopathy:Patient has been confused for past week.  Baseline bedbound.CT head on admission no acute finding history of UTI-2 months ago.  She is alert awake oriented to self  and place  COVID-positive in PCR:was placed on remdesivir - now discontinued for comfort.Continue isolation x10 days.   AKI on CKD stage IIIb:Baseline creatinine around 1.2, s/p iv hydration. Recent Labs  Lab 02/14/21 2013 02/15/21 1207  BUN 33* 29*  CREATININE 1.71* 1.42*   Insulin dependent type 2 diabetes mellitus:  Blood sugar fairly stable. Recent Labs  Lab 02/15/21 0757 02/15/21 1157 02/18/21 0819  GLUCAP 195* 94 257*   SSS with PM in place CAD at  home on Plavix aspirin Toprol  statin Hypothyroidism History of GI bleed from esophageal ulcer in the past Anemia likely from chronic disease with history of GI bleed  Diet Order             DIET - DYS 1 Room service appropriate? Yes; Fluid consistency: Thin  Diet effective now                   Patient's There is no height or weight on file to calculate BMI.  DVT prophylaxis:   None for comfort Code Status:   Code Status: DNR  Family Communication: Discussed with nursing staff, previously updated daughter regarding plan  Status is: Inpatient. Remains inpatient appropriate because:IV treatments appropriate due to intensity of illness or inability to take PO and Inpatient level of care appropriate due to severity of illness  Dispo: The patient is from: SNF              Anticipated d/c is to: Will likely need to plan on discharge to facility with hospice care.await palliative meeting with daughter. Daughter wants to go to [facility] once isolation over.              Patient currently is medically stable for discharge   Difficult to place patient No  Unresulted Labs (From admission, onward)    None       Medications reviewed:   Consultants: Pulmonary critical care  Cardiology  Palliative care   Procedures:see note  Antimicrobials: Anti-infectives (From admission, onward)    Start     Dose/Rate Route Frequency Ordered Stop   02/15/21 1400  cefTRIAXone (ROCEPHIN) 1 g in sodium chloride 0.9 % 100 mL IVPB  Status:  Discontinued        1 g 200 mL/hr over 30 Minutes Intravenous Every 24 hours 02/15/21 1345 02/15/21 1441   02/15/21 1000  remdesivir 100 mg in sodium chloride 0.9 % 100 mL IVPB  Status:  Discontinued       See Hyperspace for full Linked Orders Report.   100 mg 200 mL/hr over 30 Minutes Intravenous Daily 02/15/21 0029 02/15/21 1445   02/15/21 0030  remdesivir 200 mg in sodium chloride 0.9% 250 mL IVPB       See Hyperspace for full Linked Orders Report.   200 mg 580 mL/hr over  30 Minutes Intravenous Once 02/15/21 0029 02/15/21 0339      Culture/Microbiology    Component Value Date/Time   SDES  02/15/2021 1233    URINE, RANDOM Performed at [facility], [address] [place], [place] [address]    [facility]  02/15/2021 1233    NONE Performed at [facility], [place], [place] [address]    CULT (A) 02/15/2021 1233    >=100,000 COLONIES/mL VANCOMYCIN RESISTANT ENTEROCOCCUS 20,000 COLONIES/mL KLEBSIELLA PNEUMONIAE Confirmed Extended Spectrum Beta-Lactamase Producer (ESBL).  In bloodstream infections from ESBL organisms, carbapenems are preferred over piperacillin/tazobactam. They are shown to have a lower risk of mortality.    REPTSTATUS 02/18/2021 FINAL 02/15/2021 1233    Other culture-see note  Objective: Vitals: Today's Vitals   02/18/21 1945 02/18/21 2153 02/19/21 0749 02/19/21 0758  BP:    98/78  Pulse:   74 63  Resp:    18  Temp:    (!) 97.4 F (36.3  C)  TempSrc:    Oral  SpO2:   96% 97%  PainSc: 3  Asleep     No intake or output data in the 24 hours ending 02/19/21 1012  There were no vitals filed for this visit. Weight change:   Intake/Output from previous [place]: No intake/output data recorded. Intake/Output this shift: No intake/output data recorded. There were no vitals filed for this visit.  Examination: General exam: Alert awake oriented to self place, not in distress  HEENT:Oral mucosa moist, Ear/Nose WNL grossly, dentition normal. Respiratory system: bilaterally diminished at the base, no use of accessory muscle Cardiovascular system: S1 & S2 +, No JVD,. Gastrointestinal system: Abdomen soft,NT,ND, BS+ Nervous System:Alert, awake, moving extremities Extremities: no edema, distal peripheral pulses palpable.  Skin: No rashes,no icterus. MSK: Normal muscle bulk,tone, power   Data Reviewed: I have personally reviewed following labs and imaging studies CBC: Recent Labs  Lab 02/14/21 2013  02/15/21 1207  WBC 9.8 9.4  NEUTROABS 6.6  --   HGB 11.4* 9.1*  HCT 34.0* 27.9*  MCV 92.4 93.9  PLT 426* 924    Basic Metabolic Panel: Recent Labs  Lab 02/14/21 2013 02/15/21 1207  NA 131* 137  K 4.4 3.4*  CL 96* 106  CO2 25 25  GLUCOSE 229* 85  BUN 33* 29*  CREATININE 1.71* 1.42*  CALCIUM 8.7* 7.8*    GFR: CrCl cannot be calculated (Unknown ideal weight.). Liver Function Tests: Recent Labs  Lab 02/14/21 2013 02/15/21 1207  AST 29 29  ALT 14 13  ALKPHOS 94 64  BILITOT 0.7 0.3  PROT 6.3* 4.8*  ALBUMIN 2.6* 2.0*    No results for input(s): LIPASE, AMYLASE in the last 168 hours. No results for input(s): AMMONIA in the last 168 hours. Coagulation Profile: No results for input(s): INR, PROTIME in the last 168 hours. Cardiac Enzymes: No results for input(s): CKTOTAL, CKMB, CKMBINDEX, TROPONINI in the last 168 hours. BNP (last 3 results) No results for input(s): PROBNP in the last 8760 hours. HbA1C: No results for input(s): HGBA1C in the last 72 hours.  CBG: Recent Labs  Lab 02/15/21 0757 02/15/21 1157 02/18/21 0819  GLUCAP 195* 94 257*    Lipid Profile: No results for input(s): CHOL, HDL, LDLCALC, TRIG, CHOLHDL, LDLDIRECT in the last 72 hours. Thyroid Function Tests: No results for input(s): TSH, T4TOTAL, FREET4, T3FREE, THYROIDAB in the last 72 hours. Anemia Panel: No results for input(s): VITAMINB12, FOLATE, FERRITIN, TIBC, IRON, RETICCTPCT in the last 72 hours. Sepsis Labs: Recent Labs  Lab 02/15/21 1207  PROCALCITON <0.10  LATICACIDVEN 2.6*     Recent Results (from the past 240 hour(s))  Resp Panel by RT-PCR (Flu A&B, Covid) Nasopharyngeal Swab     Status: Abnormal   Collection Time: 02/14/21 11:00 PM   Specimen: Nasopharyngeal Swab; Nasopharyngeal(NP) swabs in vial transport medium  Result Value Ref Range Status   SARS Coronavirus 2 by RT PCR POSITIVE (A) NEGATIVE Final    Comment: RESULT CALLED TO, READ BACK BY AND VERIFIED  WITH: JOSHUA MOTES$RemoveBeforeDEI'@0018'CQGxESUAGAIvcpQS$  02/15/21 RH (NOTE) SARS-CoV-2 target nucleic acids are DETECTED.  The SARS-CoV-2 RNA is generally detectable in upper respiratory specimens during the acute phase of infection. Positive results are indicative of the presence of the identified virus, but do not rule out bacterial infection or co-infection with other pathogens not detected by the test. Clinical correlation with patient history and other diagnostic information is necessary to determine patient infection status. The expected result is Negative.  Fact Sheet for  Patients: EntrepreneurPulse.com.au  Fact Sheet for Healthcare Providers: IncredibleEmployment.be  This test is not yet approved or cleared by the [place] FDA and  has been authorized for detection and/or diagnosis of SARS-CoV-2 by FDA under an Emergency Use Authorization (EUA).  This EUA will remain in effect (meaning this test can be used)  for the duration of  the COVID-19 declaration under Section 564(b)(1) of the Act, 21 U.S.C. section 360bbb-3(b)(1), unless the authorization is terminated or revoked sooner.     Influenza A by PCR NEGATIVE NEGATIVE Final   Influenza B by PCR NEGATIVE NEGATIVE Final    Comment: (NOTE) The Xpert Xpress SARS-CoV-2/FLU/RSV plus assay is intended as an aid in the diagnosis of influenza from Nasopharyngeal swab specimens and should not be used as a sole basis for treatment. Nasal washings and aspirates are unacceptable for Xpert Xpress SARS-CoV-2/FLU/RSV testing.  Fact Sheet for Patients: EntrepreneurPulse.com.au  Fact Sheet for Healthcare Providers: IncredibleEmployment.be  This test is not yet approved or cleared by the [place] FDA and has been authorized for detection and/or diagnosis of SARS-CoV-2 by FDA under an Emergency Use Authorization (EUA). This EUA will remain in effect (meaning this test can be used) for  the duration of the COVID-19 declaration under Section 564(b)(1) of the Act, 21 U.S.C. section 360bbb-3(b)(1), unless the authorization is terminated or revoked.  Performed at [facility], [address], [place], [place] [address]   Urine Culture     Status: Abnormal   Collection Time: 02/15/21 12:33 PM   Specimen: Urine, Random  Result Value Ref Range Status   Specimen Description   Final    URINE, RANDOM Performed at [facility], [address], [place], [place] [address]    Special Requests   Final    NONE Performed at [facility], [address] [place], [place] [address]    Culture (A)  Final    >=100,000 COLONIES/mL VANCOMYCIN RESISTANT ENTEROCOCCUS 20,000 COLONIES/mL KLEBSIELLA PNEUMONIAE Confirmed Extended Spectrum Beta-Lactamase Producer (ESBL).  In bloodstream infections from ESBL organisms, carbapenems are preferred over piperacillin/tazobactam. They are shown to have a lower risk of mortality.    Report Status 02/18/2021 FINAL  Final   Organism ID, Bacteria KLEBSIELLA PNEUMONIAE (A)  Final   Organism ID, Bacteria VANCOMYCIN RESISTANT ENTEROCOCCUS (A)  Final      Susceptibility   Klebsiella pneumoniae - MIC*    AMPICILLIN >=32 RESISTANT Resistant     CEFAZOLIN >=64 RESISTANT Resistant     CEFEPIME 8 INTERMEDIATE Intermediate     CEFTRIAXONE >=64 RESISTANT Resistant     CIPROFLOXACIN 1 SENSITIVE Sensitive     GENTAMICIN >=16 RESISTANT Resistant     IMIPENEM <=0.25 SENSITIVE Sensitive     NITROFURANTOIN 128 RESISTANT Resistant     TRIMETH/SULFA >=320 RESISTANT Resistant     AMPICILLIN/SULBACTAM >=32 RESISTANT Resistant     PIP/TAZO <=4 SENSITIVE Sensitive     * 20,000 COLONIES/mL KLEBSIELLA PNEUMONIAE   Vancomycin resistant enterococcus - MIC*    AMPICILLIN 16 RESISTANT Resistant     NITROFURANTOIN 128 RESISTANT Resistant     VANCOMYCIN >=32 RESISTANT Resistant     * >=100,000 COLONIES/mL VANCOMYCIN RESISTANT ENTEROCOCCUS       Radiology Studies: No results found.   LOS: 5 days   Antonieta Pert, MD [facility]  02/19/2021, 10:12 AM

## 2021-02-20 DIAGNOSIS — I4891 Unspecified atrial fibrillation: Secondary | ICD-10-CM | POA: Diagnosis not present

## 2021-02-20 NOTE — Progress Notes (Signed)
[facility] Progress Note  Patient: Regina [name]    HUT:[id number]  DOA: 02/14/2021     Date of Service: the patient was seen and examined on 02/20/2021  Brief hospital course: PMH of CAD with PCI 2009, PAF not on anticoagulation due to history of GI bleed in March 2022 from esophageal ulcer, sick sinus syndrome with pacemaker in place, hypothyroidism, CKD stage III, ID T2DM, hypothyroidism, hypertension.  Presents with complaints of confusion. Found to have rapid A. fib with hypotension and UTI with sepsis. Family decided transition to complete comfort. Currently plan is awaiting transfer to residential hospice.  Subjective: Minimally responsive.  No nausea no vomiting.  No acute events overnight.  Assessment and Plan: 1.  Severe sepsis, present on admission secondary to VRE and Klebsiella UTI Met SIRS criteria on admission with tachycardia and hypotension. Severe sepsis in the setting of encephalopathy as well as AKI. Urine culture came back positive for VRE as well as Klebsiella. Transition to complete comfort right now.  2.  Goals of care conversation Presented with AKI, lactic acidosis, severe sepsis, A. fib with RVR with hypotension. PCCM, cardiology was consulted. Palliative care was consulted. Patient was transition to complete comfort. Currently waiting residential hospice.  3.  Chronic A. fib with RVR Had tachycardia on admission. Currently rate improved. Not on any anticoagulation secondary to GI bleed.  4.  Acute metabolic encephalopathy In the setting of sepsis. Currently comfort care  5.  COVID-positive Initial testing was in May 2022. Report available in shadow chart. Will remove isolation. Chest x-ray unremarkable.  Did not present to the hospital with COVID-related illness.  6.  Acute kidney injury on CKD 3B Baseline serum creatinine around 1.2. Treated with IV fluids. Now comfort care.  Insulin dependent type 2 diabetes mellitus Was on sliding  scale insulin.  Now comfort care. SSS with PM in place CAD at  home on Plavix aspirin Toprol statin Now comfort care. Hypothyroidism History of GI bleed from esophageal ulcer in the past Anemia likely from chronic disease with history of GI bleed  Scheduled Meds: Continuous Infusions: PRN Meds: acetaminophen **OR** acetaminophen, antiseptic oral rinse, glycopyrrolate **OR** glycopyrrolate **OR** glycopyrrolate, haloperidol **OR** haloperidol **OR** haloperidol lactate, LORazepam **OR** LORazepam **OR** LORazepam, morphine injection, neomycin-bacitracin-polymyxin, OLANZapine zydis, ondansetron **OR** ondansetron (ZOFRAN) IV, polyvinyl alcohol  There is no height or weight on file to calculate BMI.        DVT Prophylaxis: Comfort care     Advance goals of care discussion: Pt is DNR.  Family Communication: no family was present at bedside, at the time of interview.   Physical Exam:  General: Appear in mild distress, no Rash; Oral Mucosa Clear, moist. no Abnormal Neck Mass Or lumps, Conjunctiva normal  Cardiovascular: S1 and S2 Present, no Murmur, Respiratory: good respiratory effort, Bilateral Air entry present and CTA, no Crackles, no wheezes Abdomen: Bowel Sound present, Soft and no tenderness Extremities: no Pedal edema Neurology: Lethargic and not oriented to time, place, and person affect flat. no new focal deficit Gait not checked due to patient safety concerns  Vitals:   02/19/21 0749 02/19/21 0758 02/19/21 2051 02/20/21 1546  BP:  98/78 (!) 88/73 (!) 93/51  Pulse: 74 63 68 76  Resp:  $Remo'18 14 19  'fJmLO$ Temp:  (!) 97.4 F (36.3 C) (!) 96.9 F (36.1 C) 97.7 F (36.5 C)  TempSrc:  Oral Axillary   SpO2: 96% 97% 91%     Disposition:  Status is: Inpatient  Remains inpatient appropriate because:Inpatient  level of care appropriate due to severity of illness  Dispo: The patient is from: Home              Anticipated d/c is to: Residential hospice              Patient  currently stable to discharge to residential hospice   Difficult to place patient No  Time spent: 35 minutes. I reviewed all nursing notes, pharmacy notes, vitals, pertinent old records. I have discussed plan of care as described above with RN.  Author: Berle Mull, MD [facility] 02/20/2021 3:50 PM  To reach On-call, see care teams to locate the attending and reach out via [email or web address]. Between 7PM-7AM, please contact night-coverage If you still have difficulty reaching the attending provider, please page the [facility] (Director on Call) for [facility] on amion for assistance.

## 2021-02-20 NOTE — TOC Progression Note (Signed)
Transition of Care [facility]) - Progression Note    Patient Details  Name: Regina [name] MRN: [id number] Date of Birth: [birth year]  [facility] CM/SW Contact  Shelbie Hutching, RN Phone Number: 02/20/2021, 4:00 PM  Clinical Narrative:    Patient is off airborne precautions, she tested + back in May so precautions no longer needed.  [facility] notified that patient no longer requires airborne precautions.  No hospice home bed today but maybe tomorrow.    Expected Discharge Plan: [place] Barriers to Discharge: Hospice Bed not available, Continued Medical Work up  Expected Discharge Plan and Services Expected Discharge Plan: [place]   Discharge Planning Services: CM Consult Post Acute Care Choice: Hospice Living arrangements for the past 2 months: [place]                 DME Arranged: N/A DME Agency: NA       HH Arranged: NA HH Agency: NA         Social Determinants of Health (SDOH) Interventions    Readmission Risk Interventions No flowsheet data found.

## 2021-02-20 NOTE — Progress Notes (Signed)
[facility]) [facility] Liaison Note:   Received information that Covid precautions have been discontinued for patient.   Unfortunately, the Hospice Home is not able to offer a room today.  Patient's family and Doran Clay, RN [facility] manager aware that hospital liaison will follow up tomorrow or sooner if a room becomes available.   Please call with any hospice related questions.   Thank you,   Bobbie "Loren Racer, [place], BSN [facility] Liaison [phone]

## 2021-02-21 DIAGNOSIS — I4891 Unspecified atrial fibrillation: Secondary | ICD-10-CM | POA: Diagnosis not present

## 2021-02-21 NOTE — Progress Notes (Signed)
Daily Progress Note   Patient Name: Regina [name]       Date: 02/21/2021 DOB: 05-Nov-1933  Age: 87 y.o. MRN#: [id number] Attending Physician: Lavina Hamman, MD Primary Care Physician: System, Provider Not In Admit Date: 02/14/2021  Reason for Consultation/Follow-up: Terminal Care  Subjective: Concerns by nursing and unit director regarding symptom management. Discussed her available orders for comfort, and also reviewed the [facility] and actual administration record for the available medications.  In to speak with patient, and patient is sleeping soundly at present. No family at bedside. Waiting for hospice facility bed.   Please give PRN Morphine as needed. If 2 doses are given within 1 hour, I recommend an infusion be initiated. Recommend using PRN Zyprexa for agitation. Other PRN medications for anxiety and agitation are available as well.   Length of Stay: 7  Current Medications: Scheduled Meds:    Continuous Infusions:   PRN Meds: acetaminophen **OR** acetaminophen, antiseptic oral rinse, glycopyrrolate **OR** glycopyrrolate **OR** glycopyrrolate, haloperidol **OR** haloperidol **OR** haloperidol lactate, LORazepam **OR** LORazepam **OR** LORazepam, morphine injection, neomycin-bacitracin-polymyxin, OLANZapine zydis, ondansetron **OR** ondansetron (ZOFRAN) IV, polyvinyl alcohol  Physical Exam Constitutional:      Comments: Eyes closed. Asleep.             Vital Signs: BP (!) 94/46 (BP Location: Left Leg)   Pulse 64   Temp (!) 96.5 F (35.8 C)   Resp 18   SpO2 92%  SpO2: SpO2: 92 % O2 Device: O2 Device: Nasal Cannula O2 Flow Rate: O2 Flow Rate (L/min): 3 L/min  Intake/output summary: No intake or output data in the 24 hours ending 02/21/21 1111 LBM: Last BM Date:  02/18/21 Baseline Weight:   Most recent weight:            Patient Active Problem List   Diagnosis Date Noted   COVID-19 virus infection    Rapid atrial fibrillation ([place]) 02/14/2021   Acute kidney injury superimposed on CKD lll ([place]) 02/14/2021   History of GI bleed 02/14/2021   Hypotension 11/23/2020   Generalized weakness 11/22/2020   CKD (chronic kidney disease), stage III ([place]) 11/22/2020   Hyperlipidemia associated with type 2 diabetes mellitus ([place]) 11/22/2020   CAD (coronary artery disease)    Hypothyroidism    Acute upper GI bleeding 10/17/2020   Pacemaker 12/16/2019  History of cancer of left breast 12/16/2019   Sick sinus syndrome due to SA node dysfunction ([place]) 12/16/2019   Pressure injury of skin 09/26/2016   C. difficile colitis 09/25/2016   Sepsis ([place]) 08/28/2016   Cellulitis 06/26/2016   Acute lower UTI 02/20/2015   Elevated troponin 02/20/2015   Hypoglycemia 02/20/2015   Insulin dependent type 2 diabetes mellitus ([place]) 02/20/2015   Hypertension associated with diabetes ([place]) 02/20/2015   HLD (hyperlipidemia) 02/20/2015   Paroxysmal atrial fibrillation ([place]) 02/20/2015   GERD (gastroesophageal reflux disease) 02/20/2015    Palliative Care Assessment & Plan    Recommendations/Plan:  Please give PRN Morphine as needed. If 2 doses are given within 1 hour, I recommend an infusion be initiated. Recommend using PRN Zyprexa for agitation. Other PRN medications for anxiety and agitation are available as well.    Code Status:    Code Status Orders  (From admission, onward)           Start     Ordered   02/15/21 1449  Do not attempt resuscitation (DNR)  Continuous       Question Answer Comment  In the event of cardiac or respiratory ARREST Do not call a "code blue"   In the event of cardiac or respiratory ARREST Do not perform Intubation, CPR, defibrillation or ACLS   In the event of cardiac or respiratory ARREST Use medication by any route,  position, wound care, and other measures to relive pain and suffering. May use oxygen, suction and manual treatment of airway obstruction as needed for comfort.   Comments MOST completed through [facility]      02/15/21 1457           Code Status History     Date Active Date Inactive Code Status Order ID Comments User Context   02/15/2021 1110 02/15/2021 1457 DNR [id number]  Antonieta Pert, MD ED   02/15/2021 0000 02/15/2021 1110 Full Code [id number]  Athena Masse, MD ED   11/22/2020 2052 11/30/2020 2252 DNR [id number]  Lenore Cordia, MD ED   10/17/2020 1854 10/28/2020 2151 Full Code [id number]  Cox, Amy N, DO ED   09/25/2016 0749 09/26/2016 1717 Full Code [id number]  Hillary Bow, MD ED   08/28/2016 1510 09/02/2016 1753 Full Code [id number]  Henreitta Leber, MD Inpatient   06/26/2016 2243 07/01/2016 1645 Full Code [id number]  Lance Coon, MD Inpatient   02/20/2015 0353 02/21/2015 1711 Full Code [id number]  Lance Coon, MD Inpatient       Prognosis:  < 2 weeks    Care plan was discussed with RN and unit director.   Thank you for allowing the Palliative Medicine Team to assist in the care of this patient.       Total Time 25 min Prolonged Time Billed  no       Greater than 50%  of this time was spent counseling and coordinating care related to the above assessment and plan.  Asencion Gowda, NP  Please contact Palliative Medicine Team phone at [phone] for questions and concerns.

## 2021-02-21 NOTE — TOC Transition Note (Signed)
Transition of Care [facility]) - CM/SW Discharge Note   Patient Details  Name: CHRISTNE PLATTS MRN: [id number] Date of Birth: 1934-01-26  Transition of Care [facility]) CM/SW Contact:  Shelbie Hutching, RN Phone Number: 02/21/2021, 3:05 PM   Clinical Narrative:    [place] home has a bed available today.  Patient will discharge this afternoon.  [facility] arranged for 5pm.     Final next level of care: [place] Barriers to Discharge: Barriers Resolved   Patient Goals and CMS Choice Patient states their goals for this hospitalization and ongoing recovery are:: Family wants patient to discharge to hospice home in [place] once COVID isolation is over CMS Medicare.gov Compare Post Acute Care list provided to:: Patient Represenative (must comment) Choice offered to / list presented to : Adult Children  Discharge Placement              Patient chooses bed at: Other - please specify in the comment section below: [facility]) Patient to be transferred to facility by: [facility] Name of family member notified: [name] Patient and family notified of of transfer: 02/21/21  Discharge Plan and Services   Discharge Planning Services: CM Consult Post Acute Care Choice: Hospice          DME Arranged: N/A DME Agency: NA       HH Arranged: NA HH Agency: NA        Social Determinants of Health (SDOH) Interventions     Readmission Risk Interventions No flowsheet data found.

## 2021-02-21 NOTE — Progress Notes (Signed)
[facility]) Hospital Liaison RN note  [facility] is now able to offer a room to Ms. Badman. I have spoken with daughter, [name], and she has accepted the bed offer. She is on her way now to complete registration paperwork.  We are requesting a 5pm pickup for transport to the Hospice Home.  Please send signed and completed DNR with patient at time of discharge.  RN please call report to [phone].  Please call with any hospice related questions or concerns.  Thank you, Margaretmary Eddy, BSN, RN [facility] Liaison [phone]

## 2021-02-21 NOTE — Progress Notes (Signed)
Called report to receiving facility and transportation came to pick up patient. IV catheter left in place per facility request.

## 2021-02-21 NOTE — TOC Progression Note (Signed)
Transition of Care [facility]) - Progression Note    Patient Details  Name: Regina [name] MRN: [id number] Date of Birth: 1934-01-02  Transition of Care [facility]) CM/SW Contact  Shelbie Hutching, RN Phone Number: 02/21/2021, 11:27 AM  Clinical Narrative:    Per [place] no hospice bed available today.    Expected Discharge Plan: [place] Barriers to Discharge: Hospice Bed not available, Continued Medical Work up  Expected Discharge Plan and Services Expected Discharge Plan: [place]   Discharge Planning Services: CM Consult Post Acute Care Choice: Hospice Living arrangements for the past 2 months: [place] Expected Discharge Date: 02/21/21               DME Arranged: N/A DME Agency: NA       HH Arranged: NA HH Agency: NA         Social Determinants of Health (SDOH) Interventions    Readmission Risk Interventions No flowsheet data found.

## 2021-02-21 NOTE — Progress Notes (Signed)
[facility]) [facility] Liaison Note:   Received information that Covid precautions have been discontinued for patient.   Unfortunately, the Hospice Home is not able to offer a room today.   Patient's family and Doran Clay, RN [facility] manager aware that hospital liaison will follow up tomorrow or sooner if a room becomes available.   Please call with any hospice related questions.   Thank you, Margaretmary Eddy, BSN, RN [facility] Liaison [phone]

## 2021-02-21 NOTE — Discharge Summary (Signed)
[facility] Discharge Summary   Patient: Regina [name] WYS:[id number]  PCP: System, Provider Not In  Date of admission: 02/14/2021   Date of discharge:  02/21/2021     Discharge Diagnoses:  Principal Problem:   Rapid atrial fibrillation ([place]) Active Problems:   Insulin dependent type 2 diabetes mellitus ([place])   Pacemaker   CAD (coronary artery disease)   Hypothyroidism   Hypotension   Acute kidney injury superimposed on CKD lll ([place])   History of GI bleed   Admitted From: home Disposition:  Residential hospice   Recommendations for Outpatient Follow-up:   Discharge Instructions     Increase activity slowly   Complete by: As directed        Diet recommendation: Comfort feed  Activity: The patient is advised to gradually reintroduce usual activities, as tolerated  Discharge Condition: stable  Code Status: DNR   History of present illness: As per the H and P dictated on admission, "Regina [name] is a 87 y.o. female with medical history significant for  CAD s/p PCI 2009, PAF not on anticoagulation due to history of GI bleed in March 2022 from esophageal ulcer, SSS s/p PPM, hypothyroidism, CKD stage III, IDT2DM, hypothyroidism, hypertension,  who presents to the ED for evaluation of altered mental status.  Patient was hospitalized 2 months prior for Klebsiella UTI.  On arrival she was alert .  She denied fever or chills or abdominal pain.  Denied cough or chest pain, nausea or vomiting or diarrhea.  Denied shortness of breath ED Course: On arrival, she was afebrile, BP initially 166/86 with pulse 138, respirations 31 with O2 sat 100% on room air.  She was noted to be in rapid A. fib on EKG.  Blood work mostly unremarkable with hemoglobin 11.4, WBC 9800, creatinine 1.71, up from baseline of 1.16. EKG as reviewed by me : A. fib with rate of 142 with no acute ST-T wave changes Imaging: Head CT with no acute findings.  Chest x-ray with no acute disease  Patient was given a dose  of IV metoprolol without improvement in heart rate and subsequently administered Cardizem and started on a Cardizem infusion.Her blood pressure felt 94/33 after initiation of the infusion, improving with an IV fluid bolus.  Hospitalist subsequently consulted for admission."  Hospital Course:  Summary of her active problems in the hospital is as following. 1.  Severe sepsis, present on admission secondary to VRE and Klebsiella UTI Met SIRS criteria on admission with tachycardia and hypotension. Severe sepsis in the setting of encephalopathy as well as AKI. Urine culture came back positive for VRE as well as Klebsiella. Transition to complete comfort right now.   2.  Goals of care conversation Presented with AKI, lactic acidosis, severe sepsis, A. fib with RVR with hypotension. PCCM, cardiology was consulted. Palliative care was consulted. Patient was transition to complete comfort. Currently waiting residential hospice. Receiving as needed IV Ativan and morphine.   3.  Chronic A. fib with RVR Had tachycardia on admission. Currently rate improved. Not on any anticoagulation secondary to GI bleed.   4.  Acute metabolic encephalopathy In the setting of sepsis. Currently comfort care   5.  COVID-positive Initial testing was in May 2022. Report available in shadow chart. Will remove isolation. Chest x-ray unremarkable.  Did not present to the hospital with COVID-related illness.   6.  Acute kidney injury on CKD 3B Baseline serum creatinine around 1.2. Treated with IV fluids. Now comfort care.   Insulin dependent  type 2 diabetes mellitus Was on sliding scale insulin.  Now comfort care. SSS with PM in place CAD at  home on Plavix aspirin Toprol statin Now comfort care. Hypothyroidism History of GI bleed from esophageal ulcer in the past Anemia likely from chronic disease with history of GI bleed  On the [place] of the discharge the patient's vitals were stable, and no other new  acute medical condition were reported. The patient was felt safe to be discharge at Residential hospice.  Consultants: Palliative care Cardiology PCCM  Procedures: none  DISCHARGE MEDICATION: Allergies as of 02/21/2021   No Known Allergies      Medication List     STOP taking these medications    aspirin EC 81 MG tablet   bumetanide 2 MG tablet Commonly known as: BUMEX   cyanocobalamin 1000 MCG tablet   fluconazole 150 MG tablet Commonly known as: DIFLUCAN   fluticasone 50 MCG/ACT nasal spray Commonly known as: FLONASE   insulin aspart 100 UNIT/ML injection Commonly known as: novoLOG   insulin glargine 100 UNIT/ML injection Commonly known as: LANTUS   ketoconazole 2 % shampoo Commonly known as: NIZORAL   levothyroxine 75 MCG tablet Commonly known as: SYNTHROID   lidocaine 5 % Commonly known as: LIDODERM   metoprolol succinate 50 MG 24 hr tablet Commonly known as: TOPROL-XL   mirtazapine 7.5 MG tablet Commonly known as: REMERON   oxyCODONE-acetaminophen 5-325 MG tablet Commonly known as: PERCOCET/ROXICET   pantoprazole 40 MG tablet Commonly known as: PROTONIX   paricalcitol 2 MCG capsule Commonly known as: ZEMPLAR   polyethylene glycol 17 g packet Commonly known as: MIRALAX / GLYCOLAX   potassium chloride SA 20 MEQ tablet Commonly known as: KLOR-CON   ranolazine 500 MG 12 hr tablet Commonly known as: RANEXA   rosuvastatin 10 MG tablet Commonly known as: CRESTOR   senna 8.6 MG Tabs tablet Commonly known as: SENOKOT   sucralfate 1 GM/10ML suspension Commonly known as: CARAFATE       TAKE these medications    acetaminophen 500 MG tablet Commonly known as: TYLENOL Take 500 mg by mouth every 6 (six) hours as needed for mild pain. What changed: Another medication with the same name was removed. Continue taking this medication, and follow the directions you see here.        Discharge Exam: There were no vitals filed for this  visit. Vitals:   02/21/21 0632 02/21/21 0634  BP: (!) 94/46   Pulse: (!) 133 64  Resp: 18   Temp: (!) 96.5 F (35.8 C)   SpO2: 98% 92%   General: Appear in no distress, no Rash; Oral Mucosa Clear, dry. no Abnormal Neck Mass Or lumps, Conjunctiva normal  Cardiovascular: S1 and S2 Present, no Murmur Respiratory: increased respiratory effort, Bilateral Air entry present and CTA, no Crackles, no wheezes Abdomen: Bowel Sound present  The results of significant diagnostics from this hospitalization (including imaging, microbiology, ancillary and laboratory) are listed below for reference.    Significant Diagnostic Studies: CT Head Wo Contrast  Result Date: 02/14/2021 CLINICAL DATA:  Mental status change.  Unknown cause. EXAM: CT HEAD WITHOUT CONTRAST TECHNIQUE: Contiguous axial images were obtained from the base of the skull through the vertex without intravenous contrast. COMPARISON:  CT head 10/23/2008 FINDINGS: Brain: Similar-appearing right frontoparietotemporal chronic infarction. Patchy and confluent areas of decreased attenuation are noted throughout the deep and periventricular white matter of the cerebral hemispheres bilaterally, compatible with chronic microvascular ischemic disease. No evidence of large-territorial acute  infarction. No parenchymal hemorrhage. No mass lesion. No extra-axial collection. No mass effect or midline shift. No hydrocephalus. Basilar cisterns are patent. Vascular: No hyperdense vessel. Atherosclerotic calcifications are present within the cavernous internal carotid and vertebral arteries. Skull: No acute fracture or focal lesion. Sinuses/Orbits: Paranasal sinuses and mastoid air cells are clear. Bilateral lens replacement. Otherwise the orbits are unremarkable. Other: Trace right occipital scalp subcutaneus soft tissue edema. IMPRESSION: No acute intracranial abnormality. Electronically Signed   By: Iven Finn M.D.   On: 02/14/2021 22:42   DG Chest Portable 1  View  Result Date: 02/14/2021 CLINICAL DATA:  Altered mental status EXAM: PORTABLE CHEST 1 VIEW COMPARISON:  11/22/2020 FINDINGS: Left-sided pacing device with leads over right atrium and right ventricle. No focal opacity or pleural effusion. Normal cardiomediastinal silhouette with aortic atherosclerosis. No pneumothorax. IMPRESSION: No active disease. Electronically Signed   By: Donavan Foil M.D.   On: 02/14/2021 20:31   ECHOCARDIOGRAM COMPLETE  Result Date: 02/16/2021    ECHOCARDIOGRAM REPORT   Patient Name:   OLA RAAP Date of Exam: 02/16/2021 Medical Rec #:  [id number]     Height:       59.0 in Accession #:    [id number]    Weight:       202.3 lb Date of Birth:  06-Jul-1934     BSA:          1.853 m Patient Age:    72 years      BP:           91/51 mmHg Patient Gender: F             HR:           141 bpm. Exam Location:  [facility] Procedure: 2D Echo, Color Doppler and Cardiac Doppler Indications:     I48.91 Atrial Fibrillation  History:         Patient has prior history of Echocardiogram examinations, most                  recent 11/27/2020. CAD; Risk Factors:Hypertension, Diabetes and                  Dyslipidemia.  Sonographer:     Charmayne Sheer RDCS (AE) Referring Phys:  [id number] Bradly Bienenstock Diagnosing Phys: Nelva Bush MD  Sonographer Comments: Suboptimal apical window and suboptimal subcostal window. Image acquisition challenging due to patient body habitus and Image acquisition challenging due to mastectomy. IMPRESSIONS  1. Left ventricular ejection fraction, by estimation, is >55%. The left ventricle has normal function. Left ventricular endocardial border not optimally defined to evaluate regional wall motion. Left ventricular diastolic parameters are indeterminate.  2. Right ventricular systolic function is normal. The right ventricular size is normal. Mildly increased right ventricular wall thickness. There is mildly elevated pulmonary artery systolic pressure.  3. Left atrial size was mildly  dilated.  4. Right atrial size was mildly dilated.  5. The mitral valve is abnormal. Trivial mitral valve regurgitation. Moderate mitral annular calcification.  6. Tricuspid valve regurgitation is moderate to severe.  7. The aortic valve has an indeterminant number of cusps. There is mild calcification of the aortic valve. There is mild thickening of the aortic valve. Aortic valve regurgitation is trivial. Mild to moderate aortic valve sclerosis/calcification is present, without any evidence of aortic stenosis.  8. The inferior vena cava is dilated in size with <50% respiratory variability, suggesting right atrial pressure of 15 mmHg. FINDINGS  Left Ventricle: Left ventricular  ejection fraction, by estimation, is >55%. The left ventricle has normal function. Left ventricular endocardial border not optimally defined to evaluate regional wall motion. The left ventricular internal cavity size was  normal in size. There is borderline left ventricular hypertrophy. Left ventricular diastolic parameters are indeterminate. Right Ventricle: The right ventricular size is normal. Mildly increased right ventricular wall thickness. Right ventricular systolic function is normal. There is mildly elevated pulmonary artery systolic pressure. The tricuspid regurgitant velocity is 2.38 m/s, and with an assumed right atrial pressure of 15 mmHg, the estimated right ventricular systolic pressure is 67.6 mmHg. Left Atrium: Left atrial size was mildly dilated. Right Atrium: Right atrial size was mildly dilated. Pericardium: There is no evidence of pericardial effusion. Presence of pericardial fat pad. Mitral Valve: The mitral valve is abnormal. There is mild thickening of the mitral valve leaflet(s). Moderate mitral annular calcification. Trivial mitral valve regurgitation. MV peak gradient, 4.4 mmHg. The mean mitral valve gradient is 2.0 mmHg. Tricuspid Valve: The tricuspid valve is grossly normal. Tricuspid valve regurgitation is  moderate to severe. Aortic Valve: The aortic valve has an indeterminant number of cusps. There is mild calcification of the aortic valve. There is mild thickening of the aortic valve. Aortic valve regurgitation is trivial. Mild to moderate aortic valve sclerosis/calcification is present, without any evidence of aortic stenosis. Aortic valve mean gradient measures 3.0 mmHg. Aortic valve peak gradient measures 4.6 mmHg. Aortic valve area, by VTI measures 0.94 cm. Pulmonic Valve: The pulmonic valve was not well visualized. Pulmonic valve regurgitation is not visualized. No evidence of pulmonic stenosis. Aorta: The aortic root is normal in size and structure. Pulmonary Artery: The pulmonary artery is not well seen. Venous: The inferior vena cava is dilated in size with less than 50% respiratory variability, suggesting right atrial pressure of 15 mmHg. IAS/Shunts: The interatrial septum was not well visualized. Additional Comments: A device lead is visualized in the right ventricle.  LEFT VENTRICLE PLAX 2D LVIDd:         5.00 cm  Diastology LVIDs:         3.70 cm  LV e' medial:    3.37 cm/s LV PW:         1.00 cm  LV E/e' medial:  27.9 LV IVS:        0.70 cm  LV e' lateral:   3.37 cm/s LVOT diam:     1.70 cm  LV E/e' lateral: 27.9 LV SV:         18 LV SV Index:   10 LVOT Area:     2.27 cm  LEFT ATRIUM             Index       RIGHT ATRIUM           Index LA diam:        3.80 cm 2.05 cm/m  RA Area:     20.14 cm 10.87 cm/m LA Vol (A2C):   51.6 ml 27.84 ml/m LA Vol (A4C):   59.9 ml 32.32 ml/m LA Biplane Vol: 56.7 ml 30.59 ml/m  AORTIC VALVE                   PULMONIC VALVE AV Area (Vmax):    1.45 cm    PV Vmax:       0.81 m/s AV Area (Vmean):   1.24 cm    PV Vmean:      57.200 cm/s AV Area (VTI):     0.94 cm  PV VTI:        0.142 m AV Vmax:           107.00 cm/s PV Peak grad:  2.6 mmHg AV Vmean:          76.600 cm/s PV Mean grad:  1.0 mmHg AV VTI:            0.192 m AV Peak Grad:      4.6 mmHg AV Mean Grad:       3.0 mmHg LVOT Vmax:         68.50 cm/s LVOT Vmean:        41.800 cm/s LVOT VTI:          0.079 m LVOT/AV VTI ratio: 0.41  AORTA Ao Root diam: 3.20 cm MITRAL VALVE               TRICUSPID VALVE MV Area (PHT): 6.54 cm    TR Peak grad:   22.7 mmHg MV Area VTI:   1.97 cm    TR Vmax:        238.00 cm/s MV Peak grad:  4.4 mmHg MV Mean grad:  2.0 mmHg    SHUNTS MV Vmax:       1.05 m/s    Systemic VTI:  0.08 m MV Vmean:      65.7 cm/s   Systemic Diam: 1.70 cm MV Decel Time: 116 msec MV E velocity: 93.97 cm/s Nelva Bush MD Electronically signed by Nelva Bush MD Signature Date/Time: 02/16/2021/1:44:26 PM    Final    [place] EKG SITE RITE  Result Date: 02/15/2021 If Site Rite image not attached, placement could not be confirmed due to current cardiac rhythm.   Microbiology: Recent Results (from the past 240 hour(s))  Resp Panel by RT-PCR (Flu A&B, Covid) Nasopharyngeal Swab     Status: Abnormal   Collection Time: 02/14/21 11:00 PM   Specimen: Nasopharyngeal Swab; Nasopharyngeal(NP) swabs in vial transport medium  Result Value Ref Range Status   SARS Coronavirus 2 by RT PCR POSITIVE (A) NEGATIVE Final    Comment: RESULT CALLED TO, READ BACK BY AND VERIFIED WITH: JOSHUA [email or web address]  02/15/21 RH (NOTE) SARS-CoV-2 target nucleic acids are DETECTED.  The SARS-CoV-2 RNA is generally detectable in upper respiratory specimens during the acute phase of infection. Positive results are indicative of the presence of the identified virus, but do not rule out bacterial infection or co-infection with other pathogens not detected by the test. Clinical correlation with patient history and other diagnostic information is necessary to determine patient infection status. The expected result is Negative.  Fact Sheet for Patients: EntrepreneurPulse.com.au  Fact Sheet for Healthcare Providers: IncredibleEmployment.be  This test is not yet approved or cleared by the [place] FDA  and  has been authorized for detection and/or diagnosis of SARS-CoV-2 by FDA under an Emergency Use Authorization (EUA).  This EUA will remain in effect (meaning this test can be used)  for the duration of  the COVID-19 declaration under Section 564(b)(1) of the Act, 21 U.S.C. section 360bbb-3(b)(1), unless the authorization is terminated or revoked sooner.     Influenza A by PCR NEGATIVE NEGATIVE Final   Influenza B by PCR NEGATIVE NEGATIVE Final    Comment: (NOTE) The Xpert Xpress SARS-CoV-2/FLU/RSV plus assay is intended as an aid in the diagnosis of influenza from Nasopharyngeal swab specimens and should not be used as a sole basis for treatment. Nasal washings and aspirates are unacceptable for Xpert Xpress SARS-CoV-2/FLU/RSV testing.  Fact Sheet for  Patients: EntrepreneurPulse.com.au  Fact Sheet for Healthcare Providers: IncredibleEmployment.be  This test is not yet approved or cleared by the [place] FDA and has been authorized for detection and/or diagnosis of SARS-CoV-2 by FDA under an Emergency Use Authorization (EUA). This EUA will remain in effect (meaning this test can be used) for the duration of the COVID-19 declaration under Section 564(b)(1) of the Act, 21 U.S.C. section 360bbb-3(b)(1), unless the authorization is terminated or revoked.  Performed at [facility], [address], [place], [place] [address]   Urine Culture     Status: Abnormal   Collection Time: 02/15/21 12:33 PM   Specimen: Urine, Random  Result Value Ref Range Status   Specimen Description   Final    URINE, RANDOM Performed at [facility], [address], [place], [place] [address]    Special Requests   Final    NONE Performed at [facility], [address] [place], [place] [address]    Culture (A)  Final    >=100,000 COLONIES/mL VANCOMYCIN RESISTANT ENTEROCOCCUS 20,000 COLONIES/mL KLEBSIELLA  PNEUMONIAE Confirmed Extended Spectrum Beta-Lactamase Producer (ESBL).  In bloodstream infections from ESBL organisms, carbapenems are preferred over piperacillin/tazobactam. They are shown to have a lower risk of mortality.    Report Status 02/18/2021 FINAL  Final   Organism ID, Bacteria KLEBSIELLA PNEUMONIAE (A)  Final   Organism ID, Bacteria VANCOMYCIN RESISTANT ENTEROCOCCUS (A)  Final      Susceptibility   Klebsiella pneumoniae - MIC*    AMPICILLIN >=32 RESISTANT Resistant     CEFAZOLIN >=64 RESISTANT Resistant     CEFEPIME 8 INTERMEDIATE Intermediate     CEFTRIAXONE >=64 RESISTANT Resistant     CIPROFLOXACIN 1 SENSITIVE Sensitive     GENTAMICIN >=16 RESISTANT Resistant     IMIPENEM <=0.25 SENSITIVE Sensitive     NITROFURANTOIN 128 RESISTANT Resistant     TRIMETH/SULFA >=320 RESISTANT Resistant     AMPICILLIN/SULBACTAM >=32 RESISTANT Resistant     PIP/TAZO <=4 SENSITIVE Sensitive     * 20,000 COLONIES/mL KLEBSIELLA PNEUMONIAE   Vancomycin resistant enterococcus - MIC*    AMPICILLIN 16 RESISTANT Resistant     NITROFURANTOIN 128 RESISTANT Resistant     VANCOMYCIN >=32 RESISTANT Resistant     * >=100,000 COLONIES/mL VANCOMYCIN RESISTANT ENTEROCOCCUS     Labs: CBC: Recent Labs  Lab 02/14/21 2013 02/15/21 1207  WBC 9.8 9.4  NEUTROABS 6.6  --   HGB 11.4* 9.1*  HCT 34.0* 27.9*  MCV 92.4 93.9  PLT 426* 060   Basic Metabolic Panel: Recent Labs  Lab 02/14/21 2013 02/15/21 1207  NA 131* 137  K 4.4 3.4*  CL 96* 106  CO2 25 25  GLUCOSE 229* 85  BUN 33* 29*  CREATININE 1.71* 1.42*  CALCIUM 8.7* 7.8*   Liver Function Tests: Recent Labs  Lab 02/14/21 2013 02/15/21 1207  AST 29 29  ALT 14 13  ALKPHOS 94 64  BILITOT 0.7 0.3  PROT 6.3* 4.8*  ALBUMIN 2.6* 2.0*   CBG: Recent Labs  Lab 02/15/21 0757 02/15/21 1157 02/18/21 0819  GLUCAP 195* 94 257*    Time spent: 35 minutes  Signed:  Berle Mull  [facility]  02/21/2021

## 2021-03-12 DEATH — deceased

## 2021-05-03 IMAGING — US US EXTREM LOW VENOUS
1 series · 13 of 24 positions shown · non-contrast
Comparison: None.

CLINICAL DATA: Bilateral leg edema.

EXAM:
BILATERAL LOWER EXTREMITY VENOUS DOPPLER ULTRASOUND
TECHNIQUE: Gray-scale sonography with compression, as well as color and duplex
ultrasound, were performed to evaluate the deep venous system(s)
from the level of the common femoral vein through the popliteal and
proximal calf veins.

[Series 1: us venous img lower bilat (dvt) · portal-venous · 13 of 50 slices shown]
[im 1/50]
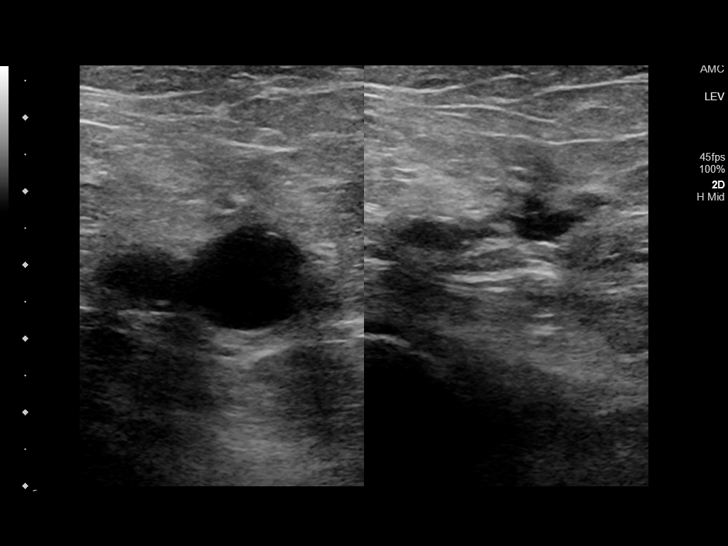
[im 5/50]
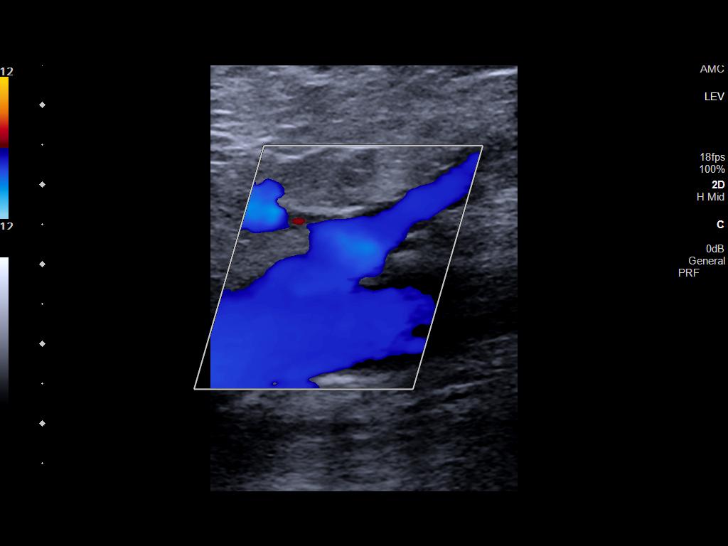
[im 9/50]
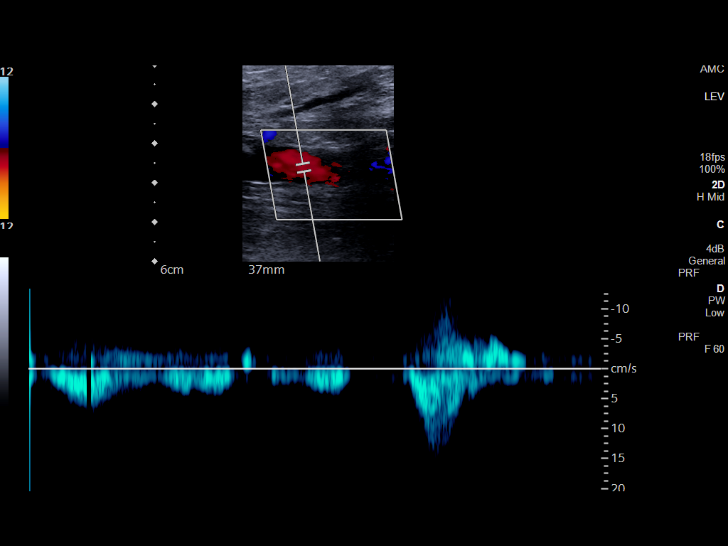
[im 13/50]
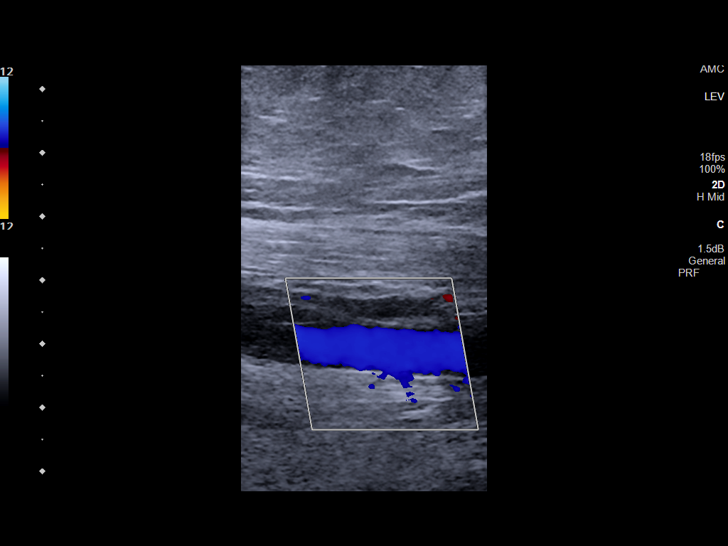
[im 18/50]
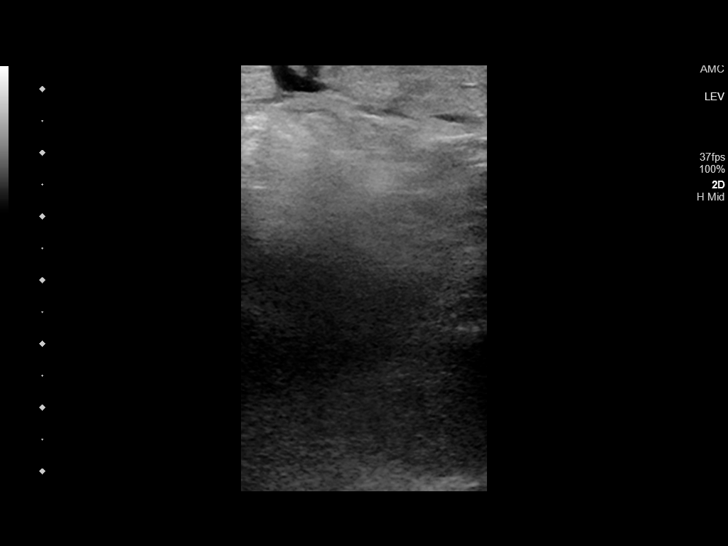
[im 22/50]
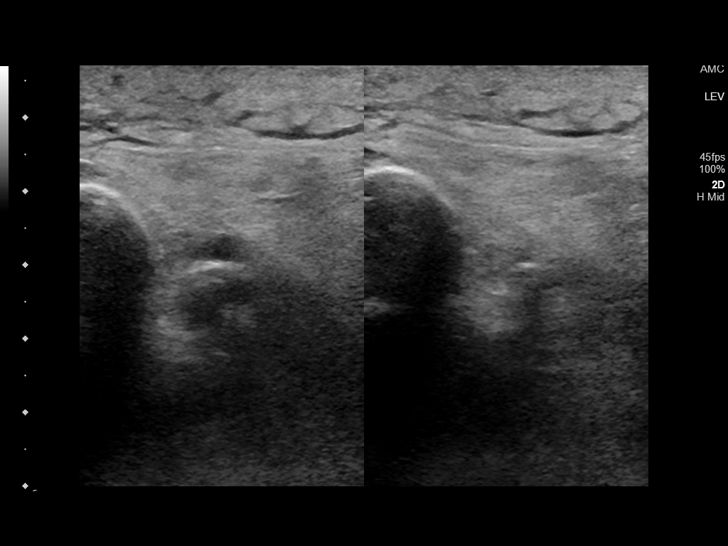
[im 26/50]
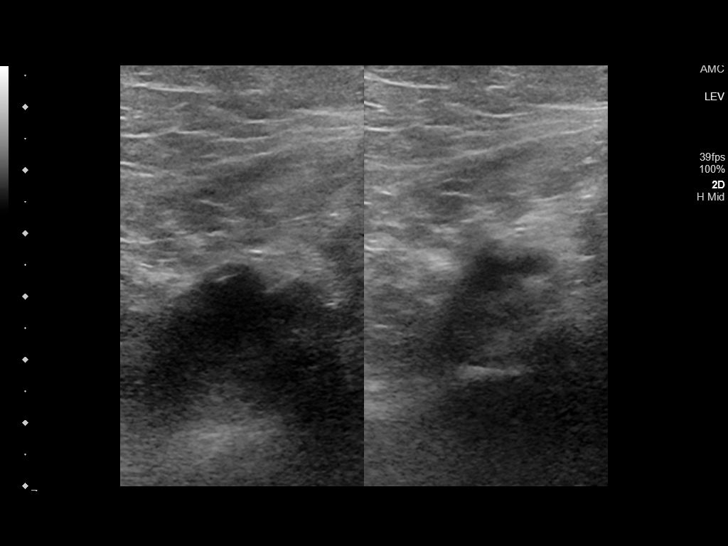
[im 28/50]
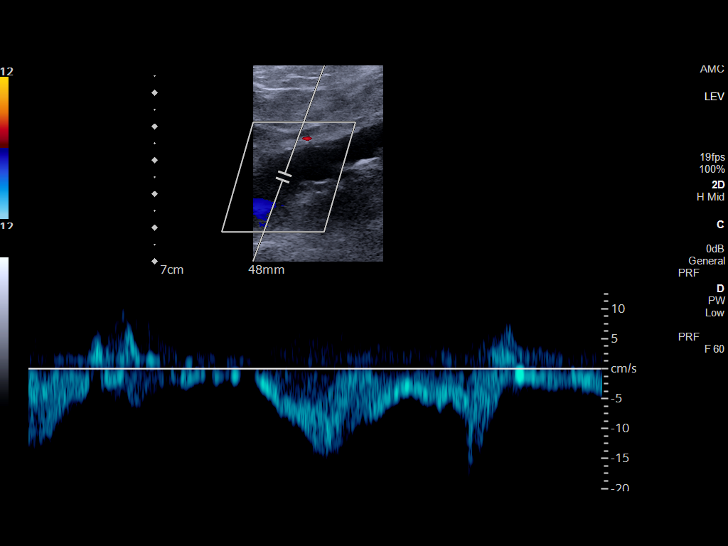
[im 32/50]
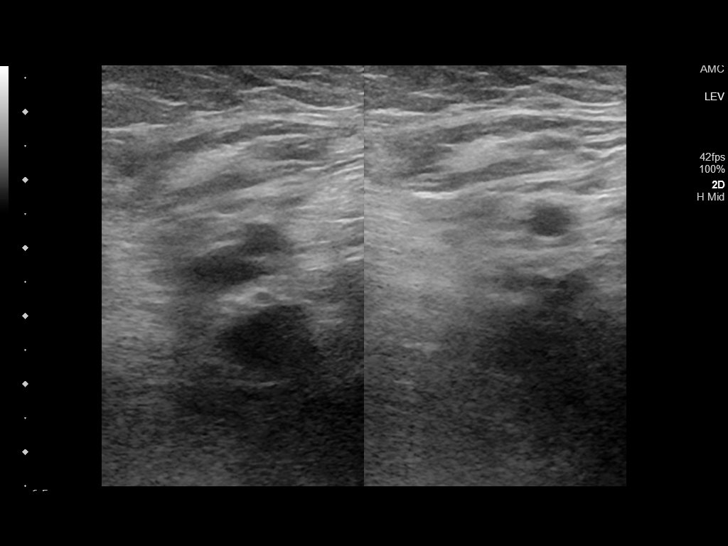
[im 37/50]
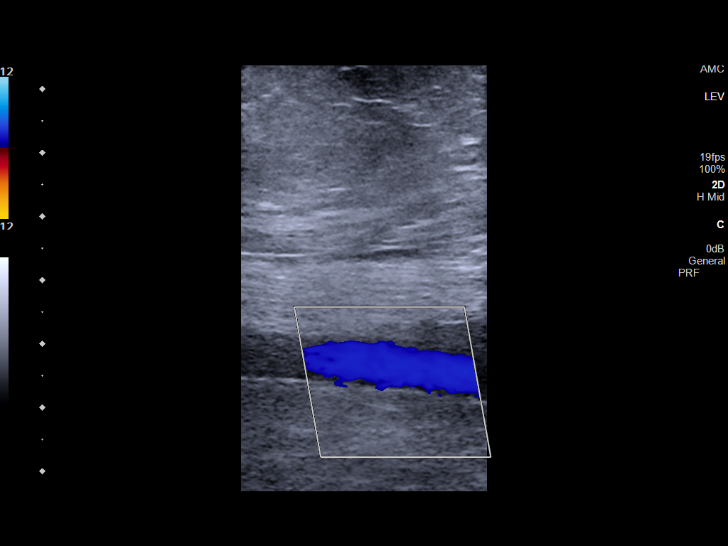
[im 41/50]
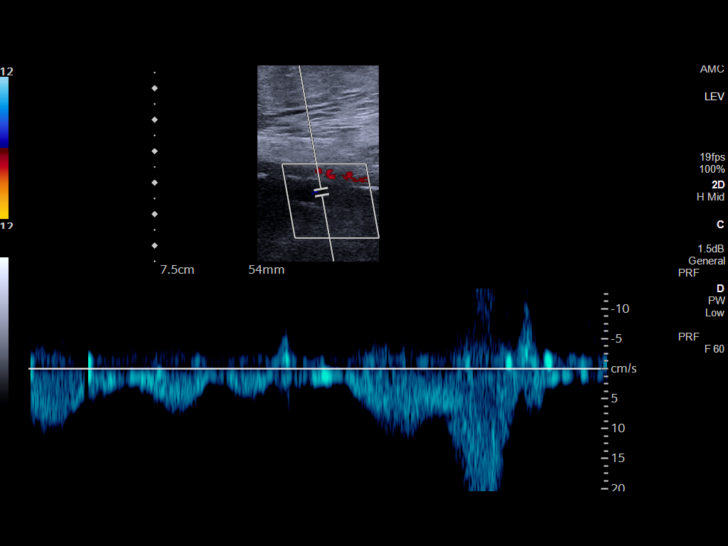
[im 45/50]
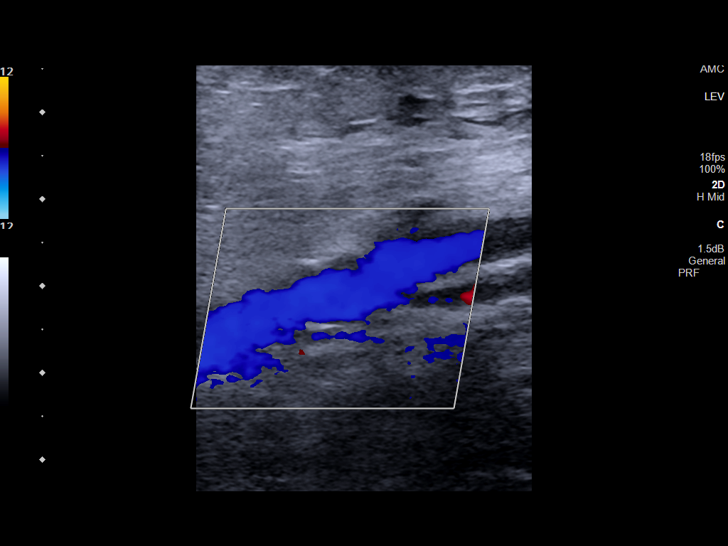
[im 50/50]
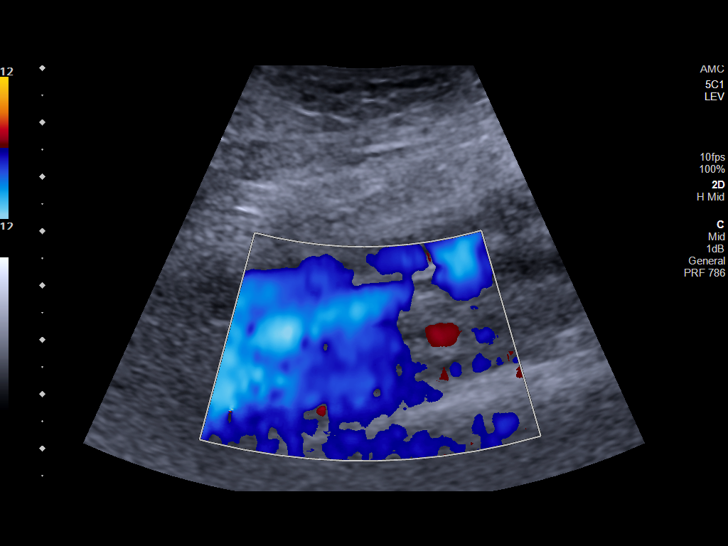

[13 of 24 positions shown; findings below may reference images not displayed]

FINDINGS: VENOUS

Normal compressibility of the BILATERAL common femoral, superficial
femoral, and popliteal veins, as well as the BILATERAL visualized
calf veins. Visualized portions of profunda femoral vein and great
saphenous vein unremarkable. No filling defects to suggest DVT on
grayscale or color Doppler imaging. Doppler waveforms show normal
direction of venous flow, normal respiratory plasticity and response
to augmentation.

Limited views of the contralateral common femoral vein are
unremarkable.

OTHER

A 5.1 cm x 1.2 cmx 2.1 cm anechoic and hypoechoic structure is seen
within the soft tissues of the popliteal fossa on the left. No
abnormal flow is noted within this region on color Doppler
evaluation.

Limitations: none
IMPRESSION: 1. No evidence of DVT within the RIGHT or LEFT lower extremity.
2. Left Baker cyst.

## 2021-05-03 IMAGING — DX DG CHEST 1V PORT
1 series · 1 of 1 positions shown · non-contrast
Comparison: 10/17/2020

CLINICAL DATA: Sepsis, diarrhea, weakness for 3 days

EXAM:
PORTABLE CHEST 1 VIEW

[chest ap]
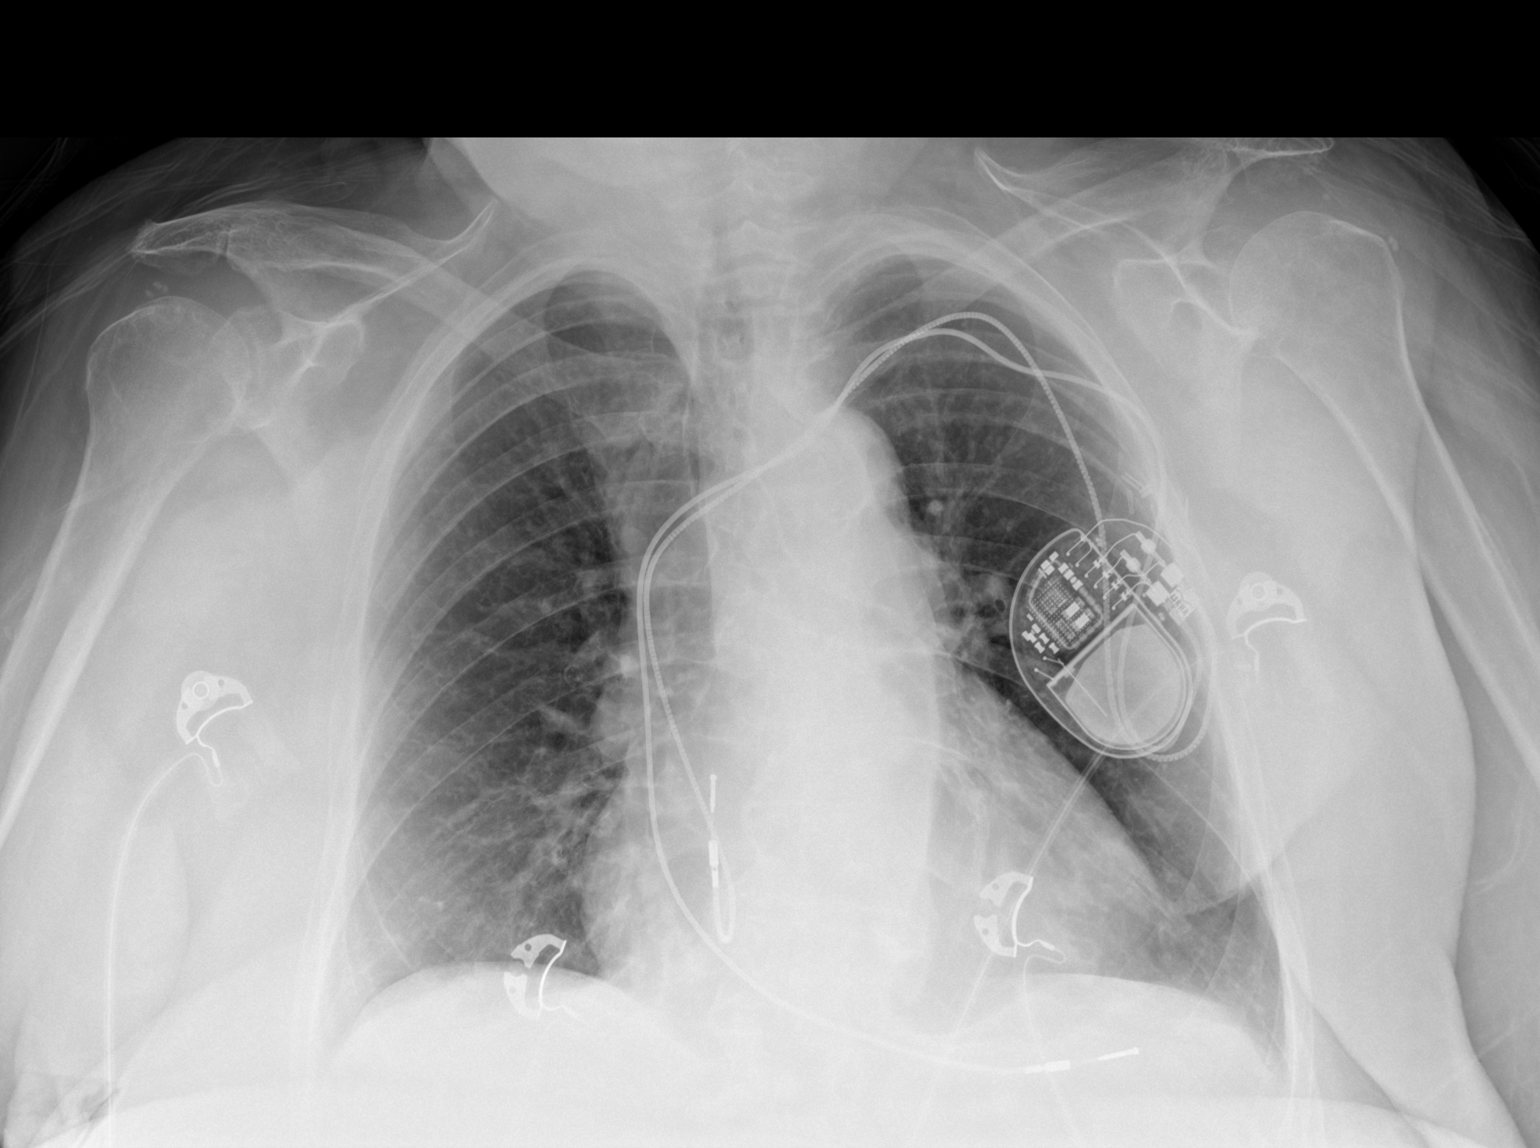

[1 of 1 positions shown; findings below may reference images not displayed]

FINDINGS: Single frontal view of the chest demonstrates stable dual lead
pacer. Cardiac silhouette is unremarkable. No airspace disease,
effusion, or pneumothorax. No acute bony abnormalities.
IMPRESSION: 1. Stable chest, no acute process.
